# Patient Record
Sex: Female | Born: 1970
Health system: Southern US, Community
[De-identification: ages and names within clinical notes are randomized; demographics above are authoritative.]

## PROBLEM LIST (undated history)

## (undated) DIAGNOSIS — N186 End stage renal disease: Secondary | ICD-10-CM

## (undated) DIAGNOSIS — E119 Type 2 diabetes mellitus without complications: Secondary | ICD-10-CM

## (undated) DIAGNOSIS — M329 Systemic lupus erythematosus, unspecified: Secondary | ICD-10-CM

## (undated) DIAGNOSIS — K922 Gastrointestinal hemorrhage, unspecified: Secondary | ICD-10-CM

## (undated) DIAGNOSIS — Z992 Dependence on renal dialysis: Secondary | ICD-10-CM

## (undated) DIAGNOSIS — I2510 Atherosclerotic heart disease of native coronary artery without angina pectoris: Secondary | ICD-10-CM

## (undated) DIAGNOSIS — N289 Disorder of kidney and ureter, unspecified: Secondary | ICD-10-CM

## (undated) DIAGNOSIS — D649 Anemia, unspecified: Secondary | ICD-10-CM

## (undated) DIAGNOSIS — I10 Essential (primary) hypertension: Secondary | ICD-10-CM

## (undated) DIAGNOSIS — K5792 Diverticulitis of intestine, part unspecified, without perforation or abscess without bleeding: Secondary | ICD-10-CM

## (undated) DIAGNOSIS — IMO0002 Reserved for concepts with insufficient information to code with codable children: Secondary | ICD-10-CM

## (undated) HISTORY — DX: End stage renal disease: N18.6

## (undated) HISTORY — PX: CARDIAC SURGERY: SHX584

## (undated) HISTORY — DX: Atherosclerotic heart disease of native coronary artery without angina pectoris: I25.10

## (undated) HISTORY — DX: Gastrointestinal hemorrhage, unspecified: K92.2

## (undated) HISTORY — PX: OTHER SURGICAL HISTORY: SHX169

## (undated) HISTORY — DX: Anemia, unspecified: D64.9

## (undated) NOTE — *Deleted (*Deleted)
[facility] Note  01/07/2020     CHIEF COMPLAINT Patient presents for No chief complaint on file.   HISTORY OF PRESENT ILLNESS: Paula Bass is a 62 y.o. female who presents to the clinic today for:   pt states she feels like the vision in her right eye has improved, but she can still see the floaters  Referring physician: Benito Mccreedy, [place] [id number] [place],  [place] [address]  HISTORICAL INFORMATION:   Selected notes from the [facility] Referred by Shirleen Schirmer, PA-C for DM exam LEE:  Ocular Hx- PMH-    CURRENT MEDICATIONS: Current Outpatient Medications (Ophthalmic Drugs)  Medication Sig  . Polyethyl Glycol-Propyl Glycol (LUBRICANT EYE DROPS) 0.4-0.3 % SOLN Place 1 drop into both eyes 3 (three) times daily as needed (dry/irritated eyes.).   No current facility-administered medications for this visit. (Ophthalmic Drugs)   Current Outpatient Medications (Other)  Medication Sig  . acetaminophen (TYLENOL) 325 MG tablet Take 2 tablets (650 mg total) by mouth every 4 (four) hours as needed for fever, headache or mild pain. (Patient not taking: Reported on 10/20/2019)  . amLODipine (NORVASC) 10 MG tablet Take 10 mg by mouth daily.  Marland Kitchen ammonium lactate (AMLACTIN) 12 % lotion Apply 1 application topically as needed for dry skin. (Patient not taking: Reported on 09/05/2019)  . aspirin EC 81 MG tablet Take 1 tablet (81 mg total) by mouth daily.  Marland Kitchen atorvastatin (LIPITOR) 40 MG tablet Take 40 mg by mouth daily.   Lorin Picket 1 GM 210 MG(Fe) tablet Take 210-420 tablets by mouth See admin instructions. Take 420 mg by mouth three times a day with meals and 210 mg twice a day with a snack  . clopidogrel (PLAVIX) 75 MG tablet Take 1 tablet (75 mg total) by mouth daily.  . insulin NPH-regular Human (70-30) 100 UNIT/ML injection Inject 30-40 Units into the skin See admin instructions. Inject 40 units in the morning and 30  units at bedtime.   . isosorbide mononitrate (IMDUR) 30 MG 24 hr tablet Take 1 tablet (30 mg total) by mouth daily. Please make yearly appt with Dr. Acie Fredrickson for December for future refills. Thank you 1st attempt  . Lactobacillus (ACIDOPHILUS) CAPS capsule Take 1 capsule by mouth daily.  Marland Kitchen lidocaine-prilocaine (EMLA) cream Apply 1 application topically Every Tuesday,Thursday,and Saturday with dialysis.   Marland Kitchen metoprolol tartrate (LOPRESSOR) 25 MG tablet Take 1 tablet (25 mg total) by mouth 2 (two) times daily.  . multivitamin (RENA-VIT) TABS tablet Take 1 tablet by mouth daily.  . nitroGLYCERIN (NITROSTAT) 0.4 MG SL tablet Place 1 tablet (0.4 mg total) under the tongue every 5 (five) minutes as needed.  Marland Kitchen oxyCODONE (OXY IR/ROXICODONE) 5 MG immediate release tablet Take 1 tablet (5 mg total) by mouth every 4 (four) hours as needed for moderate pain or severe pain (5mg  for moderate pain, 10mg  for severe pain). (Patient not taking: Reported on 09/05/2019)  . pantoprazole (PROTONIX) 40 MG tablet Take 1 tablet (40 mg total) by mouth daily. Needs office visit.  . predniSONE (DELTASONE) 5 MG tablet Take 5 mg by mouth daily with breakfast.    No current facility-administered medications for this visit. (Other)      REVIEW OF SYSTEMS:    ALLERGIES Allergies  Allergen Reactions  . Penicillins Hives    DID THE REACTION INVOLVE: Swelling of the face/tongue/throat, SOB, or low BP? Yes Sudden or severe rash/hives, skin peeling, or the inside  of the mouth or nose? Yes Did it require medical treatment? No When did it last happen?Within the past 10 years If all above answers are "NO", may proceed with cephalosporin use. Ceftriaxone/merrem ok    PAST MEDICAL HISTORY Past Medical History:  Diagnosis Date  . Acute diverticulitis 12/01/2017  . CAD (coronary artery disease) 2016  . CAD (coronary artery disease) 04/19/2018   Hx of PCI to [facility] in [place] in 2017 // [place] 03/2018: dLCx 47, OM1 40, OM2 50, pRCA stent  ok, mid RCA 90, EF 55-65, PCI: DES to dLCx  . Diabetes mellitus without complication ([place])   . Dialysis patient ([employer] [facility])   . Diverticulitis   . GI bleeding   . Hypertension   . LGI bleed 12/16/2017  . Lower GI bleed   . Lupus ([place])   . Renal disorder    dialysis  . UTI (urinary tract infection) 12/01/2017   Past Surgical History:  Procedure Laterality Date  . BIOPSY  10/24/2019   Procedure: BIOPSY;  Surgeon: Otis Brace, MD;  Location: [facility];  Service: Gastroenterology;;  EGD and COLON  . CARDIAC SURGERY     cardiac shunt  . COLONOSCOPY WITH PROPOFOL N/A 10/24/2019   Procedure: COLONOSCOPY WITH PROPOFOL;  Surgeon: Otis Brace, MD;  Location: [facility];  Service: Gastroenterology;  Laterality: N/A;  . CORONARY STENT INTERVENTION N/A 04/19/2018   Procedure: CORONARY STENT INTERVENTION;  Surgeon: [name], MD;  Location: [place] [facility];  Service: Cardiovascular;  Laterality: N/A;  . ESOPHAGOGASTRODUODENOSCOPY (EGD) WITH PROPOFOL N/A 10/24/2019   Procedure: ESOPHAGOGASTRODUODENOSCOPY (EGD) WITH PROPOFOL;  Surgeon: Otis Brace, MD;  Location: [facility];  Service: Gastroenterology;  Laterality: N/A;  . INCISION AND DRAINAGE PERIRECTAL ABSCESS N/A 07/09/2019   Procedure: IRRIGATION AND DEBRIDEMEN right buttock ABSCESS;  Surgeon: Kieth Brightly, Arta Bruce, MD;  Location: [facility];  Service: General;  Laterality: N/A;  . Kidney graft Left   . LEFT HEART CATH AND CORONARY ANGIOGRAPHY N/A 04/19/2018   Procedure: LEFT HEART CATH AND CORONARY ANGIOGRAPHY;  Surgeon: [name], MD;  Location: [facility];  Service: Cardiovascular;  Laterality: N/A;  . POLYPECTOMY  10/24/2019   Procedure: POLYPECTOMY;  Surgeon: Otis Brace, MD;  Location: [facility];  Service: Gastroenterology;;    FAMILY HISTORY Family History  Problem Relation Age of Onset  . Hypertension Mother   . Other Father        unkown health history  . Healthy Brother   . Healthy  Brother   . Colon cancer Neg Hx   . Esophageal cancer Neg Hx   . Breast cancer Neg Hx     SOCIAL HISTORY Social History   Tobacco Use  . Smoking status: Never Smoker  . Smokeless tobacco: Never Used  Vaping Use  . Vaping Use: Never used  Substance Use Topics  . Alcohol use: Never  . Drug use: Never         OPHTHALMIC EXAM:  Not recorded     IMAGING AND PROCEDURES  Imaging and Procedures for 01/07/2020           ASSESSMENT/PLAN:    ICD-10-CM   1. Vitreous hemorrhage of right eye ([place])  H43.11   2. 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 Moderate nonproliferative diabetic retinopathy of both eyes without macular edema associated with type 2 diabetes mellitus ([place])  [id number]   4. Retinal edema  H35.81   5. Essential hypertension  I10   6. Hypertensive retinopathy  of both eyes  H35.033   7. Combined forms of age-related cataract of both eyes  H25.813     1. Vitreous Hemorrhage OD  - diffuse VH, onset Tuesday, 10.19.21  - pt reports BP was elevated at time of onset due to inadvertent stoppage of amlodipine  - pt also reports use of blood thinner for dialysis and history of low vision OD related to lupus vasculitis and ?nerve damage  - s/p IVA OD #1 10.22.21  - today, vast improvement in diffuse VH--clearing and settling inferiorly  - BCVA remains CF OD  - OCT shows central inner retinal atrophy -- RAO/retinal vasculitits  - VH precautions reviewed -- minimize activities, keep head elevated, avoid ASA/NSAIDs/blood thinners as able  - f/u 2-3 weeks, DFE, OCT, FA  2. History of lupus vasculitis  - pt reports poor vision OD at baseline prior to onset of VH from optic nerve damage and lupus vasculitis  - pt moved to [place] ~1 yr ago from [place] where she was treated and followed by a retina specialist  - monitor-will do FA once VH clears to check for NV  3,4. Mild nonproliferative diabetic retinopathy w/o DME, both eyes - exam shows mild MA OS; OD fundus  exam obscured by diffuse VH - OCT without diabetic macular edema  - monitor  5,6. Hypertensive retinopathy OU  - pt reports SBP elevated to 160s when VH started due to missing amlodipine doses - discussed importance of tight BP control - monitor  7. Mixed Cataract OU - The symptoms of cataract, surgical options, and treatments and risks were discussed with patient. - discussed diagnosis and progression - monitor   Ophthalmic Meds Ordered this visit:  No orders of the defined types were placed in this encounter.      No follow-ups on file.  There are no Patient Instructions on file for this visit.   Explained the diagnoses, plan, and follow up with the patient and they expressed understanding.  Patient expressed understanding of the importance of proper follow up care.   This document serves as a record of services personally performed by Gardiner Sleeper, MD, PhD. It was created on their behalf by Roselee Nova, COMT. The creation of this record is the provider's dictation and/or activities during the visit.  Electronically signed by: Roselee Nova, COMT 01/06/20 10:09 AM   Gardiner Sleeper, M.D., Ph.D. Diseases & Surgery of the Retina and Vitreous [facility]   Abbreviations: M myopia (nearsighted); A astigmatism; H hyperopia (farsighted); P presbyopia; Mrx spectacle prescription;  CTL contact lenses; OD right eye; OS left eye; OU both eyes  XT exotropia; ET esotropia; PEK punctate epithelial keratitis; PEE punctate epithelial erosions; DES dry eye syndrome; MGD meibomian gland dysfunction; ATs artificial tears; PFAT's preservative free artificial tears; [place] nuclear sclerotic cataract; PSC posterior subcapsular cataract; ERM epi-retinal membrane; PVD posterior vitreous detachment; RD retinal detachment; DM diabetes mellitus; DR diabetic retinopathy; NPDR non-proliferative diabetic retinopathy; PDR proliferative diabetic retinopathy; CSME clinically  significant macular edema; DME diabetic macular edema; dbh dot blot hemorrhages; CWS cotton wool spot; POAG primary open angle glaucoma; C/D cup-to-disc ratio; HVF humphrey visual field; GVF goldmann visual field; OCT optical coherence tomography; IOP intraocular pressure; BRVO Branch retinal vein occlusion; CRVO central retinal vein occlusion; CRAO central retinal artery occlusion; BRAO branch retinal artery occlusion; RT retinal tear; SB scleral buckle; PPV pars plana vitrectomy; VH Vitreous hemorrhage; PRP panretinal laser photocoagulation; IVK intravitreal kenalog; VMT vitreomacular traction; MH Macular  hole;  NVD neovascularization of the disc; NVE neovascularization elsewhere; AREDS age related eye disease study; ARMD age related macular degeneration; POAG primary open angle glaucoma; EBMD epithelial/anterior basement membrane dystrophy; ACIOL anterior chamber intraocular lens; IOL intraocular lens; PCIOL posterior chamber intraocular lens; Phaco/IOL phacoemulsification with intraocular lens placement; [place] photorefractive keratectomy; LASIK laser assisted in situ keratomileusis; HTN hypertension; DM diabetes mellitus; COPD chronic obstructive pulmonary disease

---

## 2016-02-28 DIAGNOSIS — N186 End stage renal disease: Secondary | ICD-10-CM | POA: Diagnosis not present

## 2016-02-28 DIAGNOSIS — Z992 Dependence on renal dialysis: Secondary | ICD-10-CM | POA: Diagnosis not present

## 2016-02-28 DIAGNOSIS — M3210 Systemic lupus erythematosus, organ or system involvement unspecified: Secondary | ICD-10-CM | POA: Diagnosis not present

## 2016-02-28 DIAGNOSIS — E1122 Type 2 diabetes mellitus with diabetic chronic kidney disease: Secondary | ICD-10-CM | POA: Diagnosis not present

## 2016-03-30 DIAGNOSIS — E1122 Type 2 diabetes mellitus with diabetic chronic kidney disease: Secondary | ICD-10-CM | POA: Diagnosis not present

## 2016-03-30 DIAGNOSIS — N186 End stage renal disease: Secondary | ICD-10-CM | POA: Diagnosis not present

## 2016-03-30 DIAGNOSIS — Z992 Dependence on renal dialysis: Secondary | ICD-10-CM | POA: Diagnosis not present

## 2016-03-30 DIAGNOSIS — M3210 Systemic lupus erythematosus, organ or system involvement unspecified: Secondary | ICD-10-CM | POA: Diagnosis not present

## 2016-08-27 DIAGNOSIS — N186 End stage renal disease: Secondary | ICD-10-CM | POA: Diagnosis not present

## 2016-08-27 DIAGNOSIS — E1122 Type 2 diabetes mellitus with diabetic chronic kidney disease: Secondary | ICD-10-CM | POA: Diagnosis not present

## 2016-08-27 DIAGNOSIS — M3210 Systemic lupus erythematosus, organ or system involvement unspecified: Secondary | ICD-10-CM | POA: Diagnosis not present

## 2016-08-27 DIAGNOSIS — Z992 Dependence on renal dialysis: Secondary | ICD-10-CM | POA: Diagnosis not present

## 2016-08-28 DIAGNOSIS — Z992 Dependence on renal dialysis: Secondary | ICD-10-CM | POA: Diagnosis not present

## 2016-08-28 DIAGNOSIS — D509 Iron deficiency anemia, unspecified: Secondary | ICD-10-CM | POA: Diagnosis not present

## 2016-08-28 DIAGNOSIS — N2581 Secondary hyperparathyroidism of renal origin: Secondary | ICD-10-CM | POA: Diagnosis not present

## 2016-08-28 DIAGNOSIS — N186 End stage renal disease: Secondary | ICD-10-CM | POA: Diagnosis not present

## 2016-08-28 DIAGNOSIS — D631 Anemia in chronic kidney disease: Secondary | ICD-10-CM | POA: Diagnosis not present

## 2016-08-30 DIAGNOSIS — D631 Anemia in chronic kidney disease: Secondary | ICD-10-CM | POA: Diagnosis not present

## 2016-08-30 DIAGNOSIS — N186 End stage renal disease: Secondary | ICD-10-CM | POA: Diagnosis not present

## 2016-08-30 DIAGNOSIS — D509 Iron deficiency anemia, unspecified: Secondary | ICD-10-CM | POA: Diagnosis not present

## 2016-08-30 DIAGNOSIS — Z992 Dependence on renal dialysis: Secondary | ICD-10-CM | POA: Diagnosis not present

## 2016-08-30 DIAGNOSIS — N2581 Secondary hyperparathyroidism of renal origin: Secondary | ICD-10-CM | POA: Diagnosis not present

## 2016-09-01 DIAGNOSIS — D631 Anemia in chronic kidney disease: Secondary | ICD-10-CM | POA: Diagnosis not present

## 2016-09-01 DIAGNOSIS — D509 Iron deficiency anemia, unspecified: Secondary | ICD-10-CM | POA: Diagnosis not present

## 2016-09-01 DIAGNOSIS — N2581 Secondary hyperparathyroidism of renal origin: Secondary | ICD-10-CM | POA: Diagnosis not present

## 2016-09-01 DIAGNOSIS — Z992 Dependence on renal dialysis: Secondary | ICD-10-CM | POA: Diagnosis not present

## 2016-09-01 DIAGNOSIS — N186 End stage renal disease: Secondary | ICD-10-CM | POA: Diagnosis not present

## 2016-09-05 DIAGNOSIS — N2581 Secondary hyperparathyroidism of renal origin: Secondary | ICD-10-CM | POA: Diagnosis not present

## 2016-09-05 DIAGNOSIS — N186 End stage renal disease: Secondary | ICD-10-CM | POA: Diagnosis not present

## 2016-09-05 DIAGNOSIS — D631 Anemia in chronic kidney disease: Secondary | ICD-10-CM | POA: Diagnosis not present

## 2016-09-05 DIAGNOSIS — D509 Iron deficiency anemia, unspecified: Secondary | ICD-10-CM | POA: Diagnosis not present

## 2016-09-05 DIAGNOSIS — Z992 Dependence on renal dialysis: Secondary | ICD-10-CM | POA: Diagnosis not present

## 2016-09-08 DIAGNOSIS — N2581 Secondary hyperparathyroidism of renal origin: Secondary | ICD-10-CM | POA: Diagnosis not present

## 2016-09-08 DIAGNOSIS — N186 End stage renal disease: Secondary | ICD-10-CM | POA: Diagnosis not present

## 2016-09-08 DIAGNOSIS — Z992 Dependence on renal dialysis: Secondary | ICD-10-CM | POA: Diagnosis not present

## 2016-09-08 DIAGNOSIS — D631 Anemia in chronic kidney disease: Secondary | ICD-10-CM | POA: Diagnosis not present

## 2016-09-08 DIAGNOSIS — D509 Iron deficiency anemia, unspecified: Secondary | ICD-10-CM | POA: Diagnosis not present

## 2016-09-11 DIAGNOSIS — Z992 Dependence on renal dialysis: Secondary | ICD-10-CM | POA: Diagnosis not present

## 2016-09-11 DIAGNOSIS — Z794 Long term (current) use of insulin: Secondary | ICD-10-CM | POA: Diagnosis not present

## 2016-09-11 DIAGNOSIS — D631 Anemia in chronic kidney disease: Secondary | ICD-10-CM | POA: Diagnosis not present

## 2016-09-11 DIAGNOSIS — E119 Type 2 diabetes mellitus without complications: Secondary | ICD-10-CM | POA: Diagnosis not present

## 2016-09-11 DIAGNOSIS — N2581 Secondary hyperparathyroidism of renal origin: Secondary | ICD-10-CM | POA: Diagnosis not present

## 2016-09-11 DIAGNOSIS — D509 Iron deficiency anemia, unspecified: Secondary | ICD-10-CM | POA: Diagnosis not present

## 2016-09-11 DIAGNOSIS — N186 End stage renal disease: Secondary | ICD-10-CM | POA: Diagnosis not present

## 2016-09-12 DIAGNOSIS — K2990 Gastroduodenitis, unspecified, without bleeding: Secondary | ICD-10-CM | POA: Diagnosis not present

## 2016-09-13 DIAGNOSIS — Z992 Dependence on renal dialysis: Secondary | ICD-10-CM | POA: Diagnosis not present

## 2016-09-13 DIAGNOSIS — N186 End stage renal disease: Secondary | ICD-10-CM | POA: Diagnosis not present

## 2016-09-13 DIAGNOSIS — Z01419 Encounter for gynecological examination (general) (routine) without abnormal findings: Secondary | ICD-10-CM | POA: Diagnosis not present

## 2016-09-13 DIAGNOSIS — D509 Iron deficiency anemia, unspecified: Secondary | ICD-10-CM | POA: Diagnosis not present

## 2016-09-13 DIAGNOSIS — D631 Anemia in chronic kidney disease: Secondary | ICD-10-CM | POA: Diagnosis not present

## 2016-09-13 DIAGNOSIS — N2581 Secondary hyperparathyroidism of renal origin: Secondary | ICD-10-CM | POA: Diagnosis not present

## 2016-09-15 DIAGNOSIS — N186 End stage renal disease: Secondary | ICD-10-CM | POA: Diagnosis not present

## 2016-09-15 DIAGNOSIS — N2581 Secondary hyperparathyroidism of renal origin: Secondary | ICD-10-CM | POA: Diagnosis not present

## 2016-09-15 DIAGNOSIS — D509 Iron deficiency anemia, unspecified: Secondary | ICD-10-CM | POA: Diagnosis not present

## 2016-09-15 DIAGNOSIS — D631 Anemia in chronic kidney disease: Secondary | ICD-10-CM | POA: Diagnosis not present

## 2016-09-15 DIAGNOSIS — Z992 Dependence on renal dialysis: Secondary | ICD-10-CM | POA: Diagnosis not present

## 2016-09-18 DIAGNOSIS — D509 Iron deficiency anemia, unspecified: Secondary | ICD-10-CM | POA: Diagnosis not present

## 2016-09-18 DIAGNOSIS — N186 End stage renal disease: Secondary | ICD-10-CM | POA: Diagnosis not present

## 2016-09-18 DIAGNOSIS — D631 Anemia in chronic kidney disease: Secondary | ICD-10-CM | POA: Diagnosis not present

## 2016-09-18 DIAGNOSIS — Z992 Dependence on renal dialysis: Secondary | ICD-10-CM | POA: Diagnosis not present

## 2016-09-18 DIAGNOSIS — N2581 Secondary hyperparathyroidism of renal origin: Secondary | ICD-10-CM | POA: Diagnosis not present

## 2016-09-20 DIAGNOSIS — D509 Iron deficiency anemia, unspecified: Secondary | ICD-10-CM | POA: Diagnosis not present

## 2016-09-20 DIAGNOSIS — D631 Anemia in chronic kidney disease: Secondary | ICD-10-CM | POA: Diagnosis not present

## 2016-09-20 DIAGNOSIS — N186 End stage renal disease: Secondary | ICD-10-CM | POA: Diagnosis not present

## 2016-09-20 DIAGNOSIS — N2581 Secondary hyperparathyroidism of renal origin: Secondary | ICD-10-CM | POA: Diagnosis not present

## 2016-09-20 DIAGNOSIS — Z992 Dependence on renal dialysis: Secondary | ICD-10-CM | POA: Diagnosis not present

## 2016-09-22 DIAGNOSIS — N2581 Secondary hyperparathyroidism of renal origin: Secondary | ICD-10-CM | POA: Diagnosis not present

## 2016-09-22 DIAGNOSIS — D509 Iron deficiency anemia, unspecified: Secondary | ICD-10-CM | POA: Diagnosis not present

## 2016-09-22 DIAGNOSIS — D631 Anemia in chronic kidney disease: Secondary | ICD-10-CM | POA: Diagnosis not present

## 2016-09-22 DIAGNOSIS — Z992 Dependence on renal dialysis: Secondary | ICD-10-CM | POA: Diagnosis not present

## 2016-09-22 DIAGNOSIS — N186 End stage renal disease: Secondary | ICD-10-CM | POA: Diagnosis not present

## 2016-09-25 DIAGNOSIS — D631 Anemia in chronic kidney disease: Secondary | ICD-10-CM | POA: Diagnosis not present

## 2016-09-25 DIAGNOSIS — Z992 Dependence on renal dialysis: Secondary | ICD-10-CM | POA: Diagnosis not present

## 2016-09-25 DIAGNOSIS — N186 End stage renal disease: Secondary | ICD-10-CM | POA: Diagnosis not present

## 2016-09-25 DIAGNOSIS — D509 Iron deficiency anemia, unspecified: Secondary | ICD-10-CM | POA: Diagnosis not present

## 2016-09-25 DIAGNOSIS — N2581 Secondary hyperparathyroidism of renal origin: Secondary | ICD-10-CM | POA: Diagnosis not present

## 2016-09-27 DIAGNOSIS — E1122 Type 2 diabetes mellitus with diabetic chronic kidney disease: Secondary | ICD-10-CM | POA: Diagnosis not present

## 2016-09-27 DIAGNOSIS — Z992 Dependence on renal dialysis: Secondary | ICD-10-CM | POA: Diagnosis not present

## 2016-09-27 DIAGNOSIS — N186 End stage renal disease: Secondary | ICD-10-CM | POA: Diagnosis not present

## 2016-09-27 DIAGNOSIS — M3210 Systemic lupus erythematosus, organ or system involvement unspecified: Secondary | ICD-10-CM | POA: Diagnosis not present

## 2016-09-29 DIAGNOSIS — Z992 Dependence on renal dialysis: Secondary | ICD-10-CM | POA: Diagnosis not present

## 2016-09-29 DIAGNOSIS — D631 Anemia in chronic kidney disease: Secondary | ICD-10-CM | POA: Diagnosis not present

## 2016-09-29 DIAGNOSIS — N186 End stage renal disease: Secondary | ICD-10-CM | POA: Diagnosis not present

## 2016-09-29 DIAGNOSIS — D509 Iron deficiency anemia, unspecified: Secondary | ICD-10-CM | POA: Diagnosis not present

## 2016-09-29 DIAGNOSIS — N2581 Secondary hyperparathyroidism of renal origin: Secondary | ICD-10-CM | POA: Diagnosis not present

## 2016-10-02 DIAGNOSIS — N2581 Secondary hyperparathyroidism of renal origin: Secondary | ICD-10-CM | POA: Diagnosis not present

## 2016-10-02 DIAGNOSIS — N186 End stage renal disease: Secondary | ICD-10-CM | POA: Diagnosis not present

## 2016-10-02 DIAGNOSIS — D509 Iron deficiency anemia, unspecified: Secondary | ICD-10-CM | POA: Diagnosis not present

## 2016-10-02 DIAGNOSIS — D631 Anemia in chronic kidney disease: Secondary | ICD-10-CM | POA: Diagnosis not present

## 2016-10-02 DIAGNOSIS — Z992 Dependence on renal dialysis: Secondary | ICD-10-CM | POA: Diagnosis not present

## 2016-10-04 DIAGNOSIS — Z992 Dependence on renal dialysis: Secondary | ICD-10-CM | POA: Diagnosis not present

## 2016-10-04 DIAGNOSIS — D631 Anemia in chronic kidney disease: Secondary | ICD-10-CM | POA: Diagnosis not present

## 2016-10-04 DIAGNOSIS — D509 Iron deficiency anemia, unspecified: Secondary | ICD-10-CM | POA: Diagnosis not present

## 2016-10-04 DIAGNOSIS — N186 End stage renal disease: Secondary | ICD-10-CM | POA: Diagnosis not present

## 2016-10-04 DIAGNOSIS — N2581 Secondary hyperparathyroidism of renal origin: Secondary | ICD-10-CM | POA: Diagnosis not present

## 2016-10-06 DIAGNOSIS — M3214 Glomerular disease in systemic lupus erythematosus: Secondary | ICD-10-CM | POA: Diagnosis not present

## 2016-10-06 DIAGNOSIS — Z992 Dependence on renal dialysis: Secondary | ICD-10-CM | POA: Diagnosis not present

## 2016-10-06 DIAGNOSIS — N2581 Secondary hyperparathyroidism of renal origin: Secondary | ICD-10-CM | POA: Diagnosis not present

## 2016-10-06 DIAGNOSIS — N186 End stage renal disease: Secondary | ICD-10-CM | POA: Diagnosis not present

## 2016-10-09 DIAGNOSIS — N2581 Secondary hyperparathyroidism of renal origin: Secondary | ICD-10-CM | POA: Diagnosis not present

## 2016-10-09 DIAGNOSIS — D631 Anemia in chronic kidney disease: Secondary | ICD-10-CM | POA: Diagnosis not present

## 2016-10-09 DIAGNOSIS — Z992 Dependence on renal dialysis: Secondary | ICD-10-CM | POA: Diagnosis not present

## 2016-10-09 DIAGNOSIS — N186 End stage renal disease: Secondary | ICD-10-CM | POA: Diagnosis not present

## 2016-10-09 DIAGNOSIS — D509 Iron deficiency anemia, unspecified: Secondary | ICD-10-CM | POA: Diagnosis not present

## 2016-10-11 DIAGNOSIS — D631 Anemia in chronic kidney disease: Secondary | ICD-10-CM | POA: Diagnosis not present

## 2016-10-11 DIAGNOSIS — N186 End stage renal disease: Secondary | ICD-10-CM | POA: Diagnosis not present

## 2016-10-11 DIAGNOSIS — D509 Iron deficiency anemia, unspecified: Secondary | ICD-10-CM | POA: Diagnosis not present

## 2016-10-11 DIAGNOSIS — N2581 Secondary hyperparathyroidism of renal origin: Secondary | ICD-10-CM | POA: Diagnosis not present

## 2016-10-11 DIAGNOSIS — Z992 Dependence on renal dialysis: Secondary | ICD-10-CM | POA: Diagnosis not present

## 2016-10-13 DIAGNOSIS — N2581 Secondary hyperparathyroidism of renal origin: Secondary | ICD-10-CM | POA: Diagnosis not present

## 2016-10-13 DIAGNOSIS — D631 Anemia in chronic kidney disease: Secondary | ICD-10-CM | POA: Diagnosis not present

## 2016-10-13 DIAGNOSIS — Z992 Dependence on renal dialysis: Secondary | ICD-10-CM | POA: Diagnosis not present

## 2016-10-13 DIAGNOSIS — D509 Iron deficiency anemia, unspecified: Secondary | ICD-10-CM | POA: Diagnosis not present

## 2016-10-13 DIAGNOSIS — N186 End stage renal disease: Secondary | ICD-10-CM | POA: Diagnosis not present

## 2016-10-16 DIAGNOSIS — Z992 Dependence on renal dialysis: Secondary | ICD-10-CM | POA: Diagnosis not present

## 2016-10-16 DIAGNOSIS — D631 Anemia in chronic kidney disease: Secondary | ICD-10-CM | POA: Diagnosis not present

## 2016-10-16 DIAGNOSIS — N186 End stage renal disease: Secondary | ICD-10-CM | POA: Diagnosis not present

## 2016-10-16 DIAGNOSIS — D509 Iron deficiency anemia, unspecified: Secondary | ICD-10-CM | POA: Diagnosis not present

## 2016-10-16 DIAGNOSIS — N2581 Secondary hyperparathyroidism of renal origin: Secondary | ICD-10-CM | POA: Diagnosis not present

## 2016-10-17 DIAGNOSIS — E038 Other specified hypothyroidism: Secondary | ICD-10-CM | POA: Diagnosis not present

## 2016-10-17 DIAGNOSIS — E785 Hyperlipidemia, unspecified: Secondary | ICD-10-CM | POA: Diagnosis not present

## 2016-10-17 DIAGNOSIS — E559 Vitamin D deficiency, unspecified: Secondary | ICD-10-CM | POA: Diagnosis not present

## 2016-10-17 DIAGNOSIS — E0865 Diabetes mellitus due to underlying condition with hyperglycemia: Secondary | ICD-10-CM | POA: Diagnosis not present

## 2016-10-17 DIAGNOSIS — R109 Unspecified abdominal pain: Secondary | ICD-10-CM | POA: Diagnosis not present

## 2016-10-17 DIAGNOSIS — K900 Celiac disease: Secondary | ICD-10-CM | POA: Diagnosis not present

## 2016-10-17 DIAGNOSIS — R799 Abnormal finding of blood chemistry, unspecified: Secondary | ICD-10-CM | POA: Diagnosis not present

## 2016-10-17 DIAGNOSIS — R6889 Other general symptoms and signs: Secondary | ICD-10-CM | POA: Diagnosis not present

## 2016-10-18 DIAGNOSIS — D509 Iron deficiency anemia, unspecified: Secondary | ICD-10-CM | POA: Diagnosis not present

## 2016-10-18 DIAGNOSIS — N186 End stage renal disease: Secondary | ICD-10-CM | POA: Diagnosis not present

## 2016-10-18 DIAGNOSIS — Z992 Dependence on renal dialysis: Secondary | ICD-10-CM | POA: Diagnosis not present

## 2016-10-18 DIAGNOSIS — N2581 Secondary hyperparathyroidism of renal origin: Secondary | ICD-10-CM | POA: Diagnosis not present

## 2016-10-18 DIAGNOSIS — D631 Anemia in chronic kidney disease: Secondary | ICD-10-CM | POA: Diagnosis not present

## 2016-10-20 DIAGNOSIS — D509 Iron deficiency anemia, unspecified: Secondary | ICD-10-CM | POA: Diagnosis not present

## 2016-10-20 DIAGNOSIS — Z992 Dependence on renal dialysis: Secondary | ICD-10-CM | POA: Diagnosis not present

## 2016-10-20 DIAGNOSIS — D631 Anemia in chronic kidney disease: Secondary | ICD-10-CM | POA: Diagnosis not present

## 2016-10-20 DIAGNOSIS — N186 End stage renal disease: Secondary | ICD-10-CM | POA: Diagnosis not present

## 2016-10-20 DIAGNOSIS — N2581 Secondary hyperparathyroidism of renal origin: Secondary | ICD-10-CM | POA: Diagnosis not present

## 2016-10-23 DIAGNOSIS — Z992 Dependence on renal dialysis: Secondary | ICD-10-CM | POA: Diagnosis not present

## 2016-10-23 DIAGNOSIS — N2581 Secondary hyperparathyroidism of renal origin: Secondary | ICD-10-CM | POA: Diagnosis not present

## 2016-10-23 DIAGNOSIS — D631 Anemia in chronic kidney disease: Secondary | ICD-10-CM | POA: Diagnosis not present

## 2016-10-23 DIAGNOSIS — N186 End stage renal disease: Secondary | ICD-10-CM | POA: Diagnosis not present

## 2016-10-23 DIAGNOSIS — D509 Iron deficiency anemia, unspecified: Secondary | ICD-10-CM | POA: Diagnosis not present

## 2016-10-25 DIAGNOSIS — Z992 Dependence on renal dialysis: Secondary | ICD-10-CM | POA: Diagnosis not present

## 2016-10-25 DIAGNOSIS — D631 Anemia in chronic kidney disease: Secondary | ICD-10-CM | POA: Diagnosis not present

## 2016-10-25 DIAGNOSIS — N186 End stage renal disease: Secondary | ICD-10-CM | POA: Diagnosis not present

## 2016-10-25 DIAGNOSIS — N2581 Secondary hyperparathyroidism of renal origin: Secondary | ICD-10-CM | POA: Diagnosis not present

## 2016-10-25 DIAGNOSIS — D509 Iron deficiency anemia, unspecified: Secondary | ICD-10-CM | POA: Diagnosis not present

## 2016-10-27 DIAGNOSIS — D509 Iron deficiency anemia, unspecified: Secondary | ICD-10-CM | POA: Diagnosis not present

## 2016-10-27 DIAGNOSIS — D631 Anemia in chronic kidney disease: Secondary | ICD-10-CM | POA: Diagnosis not present

## 2016-10-27 DIAGNOSIS — Z992 Dependence on renal dialysis: Secondary | ICD-10-CM | POA: Diagnosis not present

## 2016-10-27 DIAGNOSIS — N186 End stage renal disease: Secondary | ICD-10-CM | POA: Diagnosis not present

## 2016-10-27 DIAGNOSIS — N2581 Secondary hyperparathyroidism of renal origin: Secondary | ICD-10-CM | POA: Diagnosis not present

## 2017-01-04 DIAGNOSIS — Z21 Asymptomatic human immunodeficiency virus [HIV] infection status: Secondary | ICD-10-CM | POA: Diagnosis not present

## 2017-01-04 DIAGNOSIS — E559 Vitamin D deficiency, unspecified: Secondary | ICD-10-CM | POA: Diagnosis not present

## 2017-01-04 DIAGNOSIS — R079 Chest pain, unspecified: Secondary | ICD-10-CM | POA: Diagnosis not present

## 2017-01-04 DIAGNOSIS — E038 Other specified hypothyroidism: Secondary | ICD-10-CM | POA: Diagnosis not present

## 2017-01-04 DIAGNOSIS — R0602 Shortness of breath: Secondary | ICD-10-CM | POA: Diagnosis not present

## 2017-01-04 DIAGNOSIS — R6889 Other general symptoms and signs: Secondary | ICD-10-CM | POA: Diagnosis not present

## 2017-01-04 DIAGNOSIS — B159 Hepatitis A without hepatic coma: Secondary | ICD-10-CM | POA: Diagnosis not present

## 2017-01-04 DIAGNOSIS — R799 Abnormal finding of blood chemistry, unspecified: Secondary | ICD-10-CM | POA: Diagnosis not present

## 2017-01-04 DIAGNOSIS — I249 Acute ischemic heart disease, unspecified: Secondary | ICD-10-CM | POA: Diagnosis not present

## 2017-01-04 DIAGNOSIS — R011 Cardiac murmur, unspecified: Secondary | ICD-10-CM | POA: Diagnosis not present

## 2017-01-04 DIAGNOSIS — E0865 Diabetes mellitus due to underlying condition with hyperglycemia: Secondary | ICD-10-CM | POA: Diagnosis not present

## 2017-01-04 DIAGNOSIS — E785 Hyperlipidemia, unspecified: Secondary | ICD-10-CM | POA: Diagnosis not present

## 2017-01-05 DIAGNOSIS — R0602 Shortness of breath: Secondary | ICD-10-CM | POA: Diagnosis not present

## 2017-12-01 ENCOUNTER — Emergency Department (HOSPITAL_COMMUNITY): Payer: Medicare (Managed Care)

## 2017-12-01 ENCOUNTER — Inpatient Hospital Stay (HOSPITAL_COMMUNITY)
Admission: EM | Admit: 2017-12-01 | Discharge: 2017-12-04 | DRG: 391 | Disposition: A | Discharge status: Home / Self Care | Payer: Medicare (Managed Care) | Attending: Family Medicine | Admitting: Family Medicine

## 2017-12-01 ENCOUNTER — Encounter (HOSPITAL_COMMUNITY): Payer: Self-pay

## 2017-12-01 ENCOUNTER — Other Ambulatory Visit: Payer: Self-pay

## 2017-12-01 DIAGNOSIS — E1122 Type 2 diabetes mellitus with diabetic chronic kidney disease: Secondary | ICD-10-CM | POA: Diagnosis present

## 2017-12-01 DIAGNOSIS — N390 Urinary tract infection, site not specified: Secondary | ICD-10-CM

## 2017-12-01 DIAGNOSIS — E785 Hyperlipidemia, unspecified: Secondary | ICD-10-CM | POA: Diagnosis present

## 2017-12-01 DIAGNOSIS — K5720 Diverticulitis of large intestine with perforation and abscess without bleeding: Secondary | ICD-10-CM | POA: Diagnosis present

## 2017-12-01 DIAGNOSIS — I120 Hypertensive chronic kidney disease with stage 5 chronic kidney disease or end stage renal disease: Secondary | ICD-10-CM | POA: Diagnosis present

## 2017-12-01 DIAGNOSIS — I10 Essential (primary) hypertension: Secondary | ICD-10-CM | POA: Diagnosis not present

## 2017-12-01 DIAGNOSIS — M329 Systemic lupus erythematosus, unspecified: Secondary | ICD-10-CM | POA: Diagnosis present

## 2017-12-01 DIAGNOSIS — Z794 Long term (current) use of insulin: Secondary | ICD-10-CM | POA: Diagnosis not present

## 2017-12-01 DIAGNOSIS — K5792 Diverticulitis of intestine, part unspecified, without perforation or abscess without bleeding: Secondary | ICD-10-CM

## 2017-12-01 DIAGNOSIS — N3000 Acute cystitis without hematuria: Secondary | ICD-10-CM

## 2017-12-01 DIAGNOSIS — K921 Melena: Secondary | ICD-10-CM | POA: Diagnosis not present

## 2017-12-01 DIAGNOSIS — Z22330 Carrier of Group B streptococcus: Secondary | ICD-10-CM | POA: Diagnosis not present

## 2017-12-01 DIAGNOSIS — IMO0002 Reserved for concepts with insufficient information to code with codable children: Secondary | ICD-10-CM | POA: Diagnosis present

## 2017-12-01 DIAGNOSIS — E119 Type 2 diabetes mellitus without complications: Secondary | ICD-10-CM

## 2017-12-01 DIAGNOSIS — Z992 Dependence on renal dialysis: Secondary | ICD-10-CM | POA: Diagnosis not present

## 2017-12-01 DIAGNOSIS — Z880 Allergy status to penicillin: Secondary | ICD-10-CM | POA: Diagnosis not present

## 2017-12-01 DIAGNOSIS — N186 End stage renal disease: Secondary | ICD-10-CM | POA: Diagnosis present

## 2017-12-01 HISTORY — DX: Reserved for concepts with insufficient information to code with codable children: IMO0002

## 2017-12-01 HISTORY — DX: Systemic lupus erythematosus, unspecified: M32.9

## 2017-12-01 HISTORY — DX: Type 2 diabetes mellitus without complications: E11.9

## 2017-12-01 HISTORY — DX: Urinary tract infection, site not specified: N39.0

## 2017-12-01 HISTORY — DX: Dependence on renal dialysis: Z99.2

## 2017-12-01 HISTORY — DX: Diverticulitis of intestine, part unspecified, without perforation or abscess without bleeding: K57.92

## 2017-12-01 HISTORY — DX: Essential (primary) hypertension: I10

## 2017-12-01 LAB — CBC
HCT: 37.2 % (ref 36.0–46.0)
Hemoglobin: 12 g/dL (ref 12.0–15.0)
MCH: 29.8 pg (ref 26.0–34.0)
MCHC: 32.3 g/dL (ref 30.0–36.0)
MCV: 92.3 fL (ref 78.0–100.0)
PLATELETS: 244 10*3/uL (ref 150–400)
RBC: 4.03 MIL/uL (ref 3.87–5.11)
RDW: 15.5 % (ref 11.5–15.5)
WBC: 18.8 10*3/uL — ABNORMAL HIGH (ref 4.0–10.5)

## 2017-12-01 LAB — COMPREHENSIVE METABOLIC PANEL
ALK PHOS: 67 U/L (ref 38–126)
ALT: 18 U/L (ref 0–44)
AST: 37 U/L (ref 15–41)
Albumin: 3.2 g/dL — ABNORMAL LOW (ref 3.5–5.0)
Anion gap: 15 (ref 5–15)
BUN: 18 mg/dL (ref 6–20)
CO2: 31 mmol/L (ref 22–32)
CREATININE: 5.12 mg/dL — AB (ref 0.44–1.00)
Calcium: 9 mg/dL (ref 8.9–10.3)
Chloride: 97 mmol/L — ABNORMAL LOW (ref 98–111)
GFR calc Af Amer: 11 mL/min — ABNORMAL LOW (ref >60)
GFR calc non Af Amer: 9 mL/min — ABNORMAL LOW (ref >60)
Glucose, Bld: 216 mg/dL — ABNORMAL HIGH (ref 70–99)
Potassium: 3.9 mmol/L (ref 3.5–5.1)
SODIUM: 143 mmol/L (ref 135–145)
Total Bilirubin: 0.5 mg/dL (ref 0.3–1.2)
Total Protein: 7.6 g/dL (ref 6.5–8.1)

## 2017-12-01 LAB — URINALYSIS, ROUTINE W REFLEX MICROSCOPIC
Bilirubin Urine: NEGATIVE
Glucose, UA: 50 mg/dL — AB
Hgb urine dipstick: NEGATIVE
KETONES UR: NEGATIVE mg/dL
Nitrite: NEGATIVE
PH: 9 — AB (ref 5.0–8.0)
Protein, ur: >=300 mg/dL — AB
SPECIFIC GRAVITY, URINE: 1.009 (ref 1.005–1.030)
WBC, UA: >50 WBC/hpf — ABNORMAL HIGH (ref 0–5)

## 2017-12-01 LAB — LIPASE, BLOOD: LIPASE: 53 U/L — AB (ref 11–51)

## 2017-12-01 LAB — I-STAT BETA HCG BLOOD, ED (MC, WL, AP ONLY): I-stat hCG, quantitative: <5 m[IU]/mL (ref <5)

## 2017-12-01 MED ORDER — SODIUM CHLORIDE 0.9 % IV SOLN
1.0000 g | Freq: Once | INTRAVENOUS | Status: AC
  Administered 2017-12-01: 1 g via INTRAVENOUS
  Filled 2017-12-01: qty 10

## 2017-12-01 MED ORDER — SODIUM CHLORIDE 0.9 % IV BOLUS: 1000.0000 mL | Freq: Once | INTRAVENOUS | Status: DC

## 2017-12-01 MED ORDER — MORPHINE SULFATE (PF) 4 MG/ML IV SOLN
4.0000 mg | Freq: Once | INTRAVENOUS | Status: AC
  Administered 2017-12-01: 2 mg via INTRAVENOUS
  Filled 2017-12-01: qty 1

## 2017-12-01 MED ORDER — METRONIDAZOLE IN NACL 5-0.79 MG/ML-% IV SOLN
500.0000 mg | Freq: Once | INTRAVENOUS | Status: AC
  Administered 2017-12-01: 500 mg via INTRAVENOUS
  Filled 2017-12-01: qty 100

## 2017-12-01 MED ORDER — ACETAMINOPHEN 325 MG PO TABS
650.0000 mg | ORAL_TABLET | Freq: Once | ORAL | Status: AC
  Administered 2017-12-01: 650 mg via ORAL
  Filled 2017-12-01: qty 2

## 2017-12-01 MED ORDER — ONDANSETRON HCL 4 MG/2ML IJ SOLN
4.0000 mg | Freq: Once | INTRAMUSCULAR | Status: AC
  Administered 2017-12-01: 4 mg via INTRAVENOUS
  Filled 2017-12-01: qty 2

## 2017-12-01 MED ORDER — CIPROFLOXACIN IN D5W 400 MG/200ML IV SOLN
400.0000 mg | Freq: Once | INTRAVENOUS | Status: AC
  Administered 2017-12-02: 400 mg via INTRAVENOUS
  Filled 2017-12-01: qty 200

## 2017-12-01 MED ORDER — KETOROLAC TROMETHAMINE 30 MG/ML IJ SOLN
15.0000 mg | Freq: Once | INTRAMUSCULAR | Status: AC
  Administered 2017-12-01: 15 mg via INTRAVENOUS
  Filled 2017-12-01: qty 1

## 2017-12-01 NOTE — H&P (Signed)
History and Physical   Paula Bass ZLD:[id number] DOB: [birth year] DOA: 12/01/2017  Referring MD/NP/PA: Dr. Vanita Panda  PCP: Patient, No Pcp Per   Patient coming from: Home  Chief Complaint: Abdominal pain for 3 days  HPI: Paula Bass is a 47 y.o. female with medical history significant of end-stage renal disease on hemodialysis on Tuesdays Thursdays and Saturdays, hypertension, diabetes, who presented to the ER with left lower quadrant abdominal pain for 3 days.  Pain is rated as 6 out of 10 persistent and getting worse in the last 2 days.  It is sharp shooting pain no radiating.  It is located in the left lower quadrant and constant.  She has had some diarrhea but no vomiting.  Patient did not take anything.  She has end-stage renal disease on hemodialysis.  Patient has had dialysis today.  Patient also has history of lupus and diabetes with good blood pressure and blood sugar control.  ED Course: Temperature 102.3, blood pressure 164/84, pulse 108, respiratory of 19 oxygen sat 100% on room air.  White count is 18.8 hemoglobin 12.0 and platelet count of 244.  Sodium 143 potassium 3.9, chloride 97 creatinine 5.12 and glucose 216.  Urinalysis showed large leukocytes many bacteria and WBC more than 50 with no hematuria.  CT abdomen and pelvis showed distal colon sigmoid diverticulitis with focal microperforation.  Bilateral kidney masses which are benign.  Review of Systems: As per HPI otherwise 10 point review of systems negative.    Past Medical History:  Diagnosis Date  . Diabetes mellitus without complication ([place])   . Dialysis patient ([place])   . Hypertension   . Lupus [facility])     Past Surgical History:  Procedure Laterality Date  . CARDIAC SURGERY     cardiac shunt     reports that she has never smoked. She has never used smokeless tobacco. She reports that she does not drink alcohol or use drugs.  Allergies  Allergen Reactions  . Penicillins Hives    No family history on  file.   Prior to Admission medications   Not on File    Physical Exam: Vitals:   12/01/17 1751 12/01/17 2045 12/01/17 2318 12/01/17 2324  BP: (!) 152/79 (!) 153/82 (!) 164/84 (!) 164/84  Pulse: (!) 104 95 (!) 108 (!) 107  Resp: 19 16    Temp: 99.7 F (37.6 C)   (!) 102.3 F (39.1 C)  TempSrc: Oral   Oral  SpO2: 98% 97% 100% 100%  Weight: 68 kg     Height: 5\' 2"  (1.575 m)         Constitutional: NAD, calm, comfortable Vitals:   12/01/17 1751 12/01/17 2045 12/01/17 2318 12/01/17 2324  BP: (!) 152/79 (!) 153/82 (!) 164/84 (!) 164/84  Pulse: (!) 104 95 (!) 108 (!) 107  Resp: 19 16    Temp: 99.7 F (37.6 C)   (!) 102.3 F (39.1 C)  TempSrc: Oral   Oral  SpO2: 98% 97% 100% 100%  Weight: 68 kg     Height: 5\' 2"  (1.575 m)      Eyes: PERRL, lids and conjunctivae normal ENMT: Mucous membranes are moist. Posterior pharynx clear of any exudate or lesions.Normal dentition.  Neck: normal, supple, no masses, no thyromegaly Respiratory: clear to auscultation bilaterally, no wheezing, no crackles. Normal respiratory effort. No accessory muscle use.  Cardiovascular: Regular rate and rhythm, no murmurs / rubs / gallops. No extremity edema. 2+ pedal pulses. No carotid bruits.  Abdomen: Left lower quadrant  abdominal tenderness, no masses palpated. No hepatosplenomegaly. Bowel sounds positive.  Musculoskeletal: no clubbing / cyanosis. No joint deformity upper and lower extremities. Good ROM, no contractures. Normal muscle tone.  Skin: no rashes, lesions, ulcers. No induration Neurologic: CN 2-12 grossly intact. Sensation intact, DTR normal. Strength 5/5 in all 4.  Psychiatric: Normal judgment and insight. Alert and oriented x 3. Normal mood.     Labs on Admission: I have personally reviewed following labs and imaging studies  CBC: Recent Labs  Lab 12/01/17 2034  WBC 18.8*  HGB 12.0  HCT 37.2  MCV 92.3  PLT 194   Basic Metabolic Panel: Recent Labs  Lab 12/01/17 2034  NA  143  K 3.9  CL 97*  CO2 31  GLUCOSE 216*  BUN 18  CREATININE 5.12*  CALCIUM 9.0   GFR: Estimated Creatinine Clearance: 12.6 mL/min (A) (by C-G formula based on SCr of 5.12 mg/dL (H)). Liver Function Tests: Recent Labs  Lab 12/01/17 2034  AST 37  ALT 18  ALKPHOS 67  BILITOT 0.5  PROT 7.6  ALBUMIN 3.2*   Recent Labs  Lab 12/01/17 2034  LIPASE 53*   No results for input(s): AMMONIA in the last 168 hours. Coagulation Profile: No results for input(s): INR, PROTIME in the last 168 hours. Cardiac Enzymes: No results for input(s): CKTOTAL, CKMB, CKMBINDEX, TROPONINI in the last 168 hours. BNP (last 3 results) No results for input(s): PROBNP in the last 8760 hours. HbA1C: No results for input(s): HGBA1C in the last 72 hours. CBG: No results for input(s): GLUCAP in the last 168 hours. Lipid Profile: No results for input(s): CHOL, HDL, LDLCALC, TRIG, CHOLHDL, LDLDIRECT in the last 72 hours. Thyroid Function Tests: No results for input(s): TSH, T4TOTAL, FREET4, T3FREE, THYROIDAB in the last 72 hours. Anemia Panel: No results for input(s): VITAMINB12, FOLATE, FERRITIN, TIBC, IRON, RETICCTPCT in the last 72 hours. Urine analysis:    Component Value Date/Time   COLORURINE YELLOW 12/01/2017 2017   APPEARANCEUR HAZY (A) 12/01/2017 2017   LABSPEC 1.009 12/01/2017 2017   PHURINE 9.0 (H) 12/01/2017 2017   GLUCOSEU 50 (A) 12/01/2017 2017   HGBUR NEGATIVE 12/01/2017 2017   BILIRUBINUR NEGATIVE 12/01/2017 2017   KETONESUR NEGATIVE 12/01/2017 2017   PROTEINUR >=300 (A) 12/01/2017 2017   NITRITE NEGATIVE 12/01/2017 2017   LEUKOCYTESUR LARGE (A) 12/01/2017 2017   Sepsis Labs: @LABRCNTIP (procalcitonin:4,lacticidven:4) )No results found for this or any previous visit (from the past 240 hour(s)).   Radiological Exams on Admission: Ct Abdomen Pelvis Wo Contrast  Result Date: 12/01/2017 CLINICAL DATA:  Left lower quadrant pain for a few days. EXAM: CT ABDOMEN AND PELVIS WITHOUT  CONTRAST TECHNIQUE: Multidetector CT imaging of the abdomen and pelvis was performed following the standard protocol without IV contrast. COMPARISON:  None. FINDINGS: Lower chest: No acute abnormality. Hepatobiliary: No focal liver abnormality is seen. No gallstones, gallbladder wall thickening, or biliary dilatation. Pancreas: Unremarkable. No pancreatic ductal dilatation or surrounding inflammatory changes. Spleen: Normal in size without focal abnormality. Adrenals/Urinary Tract: Adrenal glands are normal. There is a 2.8 cm mass, exophytic off the lower left kidney with peripheral rim-like calcification and a central attenuation of 12 Hounsfield units. There is a mass in the inferior pole the right kidney with a central attenuation of 13 Hounsfield units, consistent with a cyst with peripheral rim-like calcification. Other right renal cysts are noted. There appear to be a few punctate/small stones in both kidneys without hydronephrosis. Mild bilateral perinephric stranding is symmetric with no evidence  of acute inflammation. The ureters and bladder are normal in appearance. Stomach/Bowel: The stomach and small bowel are normal. There is fat stranding surrounding the junction of the distal descending and proximal sigmoid colon. There are diverticula in this region. No extraluminal fluid collection. There is extraluminal gas as seen on series 2, image 49 likely due to a micro perforation. No other free air in the abdomen. Other scattered colonic diverticuli with no other sites of diverticulitis. The remainder of the colon is unremarkable. The appendix is not seen but there is no secondary evidence of appendicitis. Vascular/Lymphatic: Atherosclerotic changes are seen in the nonaneurysmal aorta without adenopathy. Reproductive: Uterus and bilateral adnexa are unremarkable. Other: No additional abnormalities. Musculoskeletal: No acute or significant osseous findings. IMPRESSION: 1. The abnormality at the junction of  the distal descending colon and proximal sigmoid colon is most consistent with diverticulitis. A small amount of focal adjacent extraluminal air is consistent with a focal micro perforation. No abscess or pericolonic fluid collections. No other free air. Recommend follow-up to ensure complete resolution. 2. Both kidneys contain and mass which appears cystic centrally with peripheral eggshell like calcification. These are consistent with Bosniak 2 lesions requiring no follow-up. 3. Nonobstructive stones in both kidneys. 4. Atherosclerotic changes in the nonaneurysmal aorta. Electronically Signed   By: Dorise Bullion III M.D   On: 12/01/2017 22:04     Assessment/Plan Active Problems:   Acute diverticulitis   ESRD (end stage renal disease) ([place])   DM2 (diabetes mellitus, type 2) ([place])   Lupus ([place])   Benign essential HTN   UTI (urinary tract infection)     #1 acute diverticulitis: With microperforation.  Surgery consulted.  Patient will be maintained on IV Cipro Flagyl.  Clear liquid diet.  Monitor closely.  #2 end-stage renal disease: On hemodialysis on Tuesdays Thursdays and Saturdays.  Patient already had hemodialysis today.  Next hemodialysis will be Tuesday.  #3 diabetes: Initial sliding scale insulin with home regimen.  #4 hypertension: Blood pressure is controlled.  Continue home regimen  #5 UTI: Patient will be on Cipro for now.  Urine culture sensitivities ordered.  Blood culture and sensitivities also ordered.   DVT prophylaxis: Lovenox Code Status: Full code Family Communication: No family available Disposition Plan: Home Consults called: Dr. Sherren Mocha general surgery Admission status: Inpatient  Severity of Illness: The appropriate patient status for this patient is INPATIENT. Inpatient status is judged to be reasonable and necessary in order to provide the required intensity of service to ensure the patient's safety. The patient's presenting symptoms, physical exam findings,  and initial radiographic and laboratory data in the context of their chronic comorbidities is felt to place them at high risk for further clinical deterioration. Furthermore, it is not anticipated that the patient will be medically stable for discharge from the hospital within 2 midnights of admission. The following factors support the patient status of inpatient.   " The patient's presenting symptoms include abdominal pain with some diarrhea. " The worrisome physical exam findings include tender left lower quadrant. " The initial radiographic and laboratory data are worrisome because of CT abdomen showing diverticulitis. " The chronic co-morbidities include history of end-stage renal disease with diabetes.   * I certify that at the point of admission it is my clinical judgment that the patient will require inpatient hospital care spanning beyond 2 midnights from the point of admission due to high intensity of service, high risk for further deterioration and high frequency of surveillance required.*  Barbette Merino MD [facility] Pager [phone]  If 7PM-7AM, please contact night-coverage [email or web address] Password [email or web address]  12/01/2017, 11:35 PM

## 2017-12-01 NOTE — ED Triage Notes (Signed)
Pt states that she has been having LLQ pain for a few days. Pt states that she had diarrhea, which has since cleared up. Pt denies N/V.

## 2017-12-01 NOTE — ED Notes (Signed)
Pt stated being hard stick, blood draw limited to right arm, ac attempted, unsuccessful

## 2017-12-01 NOTE — ED Provider Notes (Signed)
[facility] DEPT Provider Note   CSN: [id number] Arrival date & time: 12/01/17  1747     History   Chief Complaint Chief Complaint  Patient presents with  . Abdominal Pain    HPI Paula Bass is a 47 y.o. female.  The history is provided by the patient. No language interpreter was used.  Abdominal Pain      47 year old female with history of diabetes, dialysis patient, hypertension, lupus presenting for evaluation of abdominal pain.  Patient report for the past 3 days she has had persistent pain to her left lower quadrant.  She described pain as a sharp shooting nonradiating sensation, constant, with associated subjective fever and some mild loose stools.  Pain worsening when she coughs when she moves and improves when she lays flat.  She denies any change in her diet or any decreased appetite, no chest pain shortness of breath productive cough, back pain dysuria hematuria vaginal bleeding or vaginal discharge.  No specific treatment tried at home.  Currently rates pain as 5 out of 10.  Past Medical History:  Diagnosis Date  . Diabetes mellitus without complication ([place])   . Dialysis patient ([place])   . Hypertension   . Lupus ([place])     There are no active problems to display for this patient.   Past Surgical History:  Procedure Laterality Date  . CARDIAC SURGERY     cardiac shunt     OB History   None      Home Medications    Prior to Admission medications   Not on File    Family History No family history on file.  Social History Social History   Tobacco Use  . Smoking status: Never Smoker  . Smokeless tobacco: Never Used  Substance Use Topics  . Alcohol use: Never    Frequency: Never  . Drug use: Never     Allergies   Penicillins   Review of Systems Review of Systems  Gastrointestinal: Positive for abdominal pain.  All other systems reviewed and are negative.    Physical Exam Updated Vital Signs BP (!)  152/79 (BP Location: Right Arm)   Pulse (!) 104   Temp 99.7 F (37.6 C) (Oral)   Resp 19   Ht 5\' 2"  (1.575 m)   Wt 68 kg   SpO2 98%   BMI 27.44 kg/m   Physical Exam  Constitutional: She appears well-developed and well-nourished. No distress.  Patient is well-appearing no acute discomfort.  HENT:  Head: Atraumatic.  Eyes: Conjunctivae are normal.  Neck: Neck supple.  Cardiovascular:  Mild tachycardia without murmur rubs or gallops  Pulmonary/Chest: Effort normal and breath sounds normal. She has no wheezes. She has no rales.  Abdominal: Normal appearance. There is tenderness in the left lower quadrant.  Neurological: She is alert.  Skin: No rash noted.  Left upper arm AV fistula with palpable thrills and bruit.  Psychiatric: She has a normal mood and affect.  Nursing note and vitals reviewed.    ED Treatments / Results  Labs (all labs ordered are listed, but only abnormal results are displayed) Labs Reviewed  LIPASE, BLOOD - Abnormal; Notable for the following components:      Result Value   Lipase 53 (*)    All other components within normal limits  COMPREHENSIVE METABOLIC PANEL - Abnormal; Notable for the following components:   Chloride 97 (*)    Glucose, Bld 216 (*)    Creatinine, Ser 5.12 (*)  Albumin 3.2 (*)    GFR calc non Af Amer 9 (*)    GFR calc Af Amer 11 (*)    All other components within normal limits  CBC - Abnormal; Notable for the following components:   WBC 18.8 (*)    All other components within normal limits  URINALYSIS, ROUTINE W REFLEX MICROSCOPIC - Abnormal; Notable for the following components:   APPearance HAZY (*)    pH 9.0 (*)    Glucose, UA 50 (*)    Protein, ur >=300 (*)    Leukocytes, UA LARGE (*)    WBC, UA >50 (*)    Bacteria, UA MANY (*)    All other components within normal limits  URINE CULTURE  I-STAT BETA HCG BLOOD, ED ([facility], [facility], [facility] ONLY)    EKG None  Radiology Ct Abdomen Pelvis Wo Contrast  Result Date:  12/01/2017 CLINICAL DATA:  Left lower quadrant pain for a few days. EXAM: CT ABDOMEN AND PELVIS WITHOUT CONTRAST TECHNIQUE: Multidetector CT imaging of the abdomen and pelvis was performed following the standard protocol without IV contrast. COMPARISON:  None. FINDINGS: Lower chest: No acute abnormality. Hepatobiliary: No focal liver abnormality is seen. No gallstones, gallbladder wall thickening, or biliary dilatation. Pancreas: Unremarkable. No pancreatic ductal dilatation or surrounding inflammatory changes. Spleen: Normal in size without focal abnormality. Adrenals/Urinary Tract: Adrenal glands are normal. There is a 2.8 cm mass, exophytic off the lower left kidney with peripheral rim-like calcification and a central attenuation of 12 Hounsfield units. There is a mass in the inferior pole the right kidney with a central attenuation of 13 Hounsfield units, consistent with a cyst with peripheral rim-like calcification. Other right renal cysts are noted. There appear to be a few punctate/small stones in both kidneys without hydronephrosis. Mild bilateral perinephric stranding is symmetric with no evidence of acute inflammation. The ureters and bladder are normal in appearance. Stomach/Bowel: The stomach and small bowel are normal. There is fat stranding surrounding the junction of the distal descending and proximal sigmoid colon. There are diverticula in this region. No extraluminal fluid collection. There is extraluminal gas as seen on series 2, image 49 likely due to a micro perforation. No other free air in the abdomen. Other scattered colonic diverticuli with no other sites of diverticulitis. The remainder of the colon is unremarkable. The appendix is not seen but there is no secondary evidence of appendicitis. Vascular/Lymphatic: Atherosclerotic changes are seen in the nonaneurysmal aorta without adenopathy. Reproductive: Uterus and bilateral adnexa are unremarkable. Other: No additional abnormalities.  Musculoskeletal: No acute or significant osseous findings. IMPRESSION: 1. The abnormality at the junction of the distal descending colon and proximal sigmoid colon is most consistent with diverticulitis. A small amount of focal adjacent extraluminal air is consistent with a focal micro perforation. No abscess or pericolonic fluid collections. No other free air. Recommend follow-up to ensure complete resolution. 2. Both kidneys contain and mass which appears cystic centrally with peripheral eggshell like calcification. These are consistent with Bosniak 2 lesions requiring no follow-up. 3. Nonobstructive stones in both kidneys. 4. Atherosclerotic changes in the nonaneurysmal aorta. Electronically Signed   By: Dorise Bullion III M.D   On: 12/01/2017 22:04    Procedures Procedures (including critical care time)  Medications Ordered in ED Medications  ciprofloxacin (CIPRO) IVPB 400 mg (has no administration in time range)  metroNIDAZOLE (FLAGYL) IVPB 500 mg (500 mg Intravenous New Bag/Given 12/01/17 2243)  morphine 4 MG/ML injection 4 mg (2 mg Intravenous Given 12/01/17 2103)  ondansetron [facility]) injection 4 mg (4 mg Intravenous Given 12/01/17 2102)  cefTRIAXone (ROCEPHIN) 1 g in sodium chloride 0.9 % 100 mL IVPB (0 g Intravenous Stopped 12/01/17 2242)     Initial Impression / Assessment and Plan / ED Course  I have reviewed the triage vital signs and the nursing notes.  Pertinent labs & imaging results that were available during my care of the patient were reviewed by me and considered in my medical decision making (see chart for details).     BP (!) 164/84 (BP Location: Right Arm)   Pulse (!) 108   Temp 99.7 F (37.6 C) (Oral)   Resp 16   Ht 5\' 2"  (1.575 m)   Wt 68 kg   SpO2 100%   BMI 27.44 kg/m    Final Clinical Impressions(s) / ED Diagnoses   Final diagnoses:  Acute diverticulitis    ED Discharge Orders    None     7:59 PM Patient here with subjective fever, lower  abdominal pain and loose stools.  She does have some tenderness to her left lower quadrant on exam.  She is a dialysis patient.  She would benefit from an abdominal pelvic CT scan for further evaluation.  Pain medication given.  9:27 PM Patient patient is still making urine.  Her urine shows signs of urinary tract infection.  She also has elevated white count of 18.8.  She would benefit from an abdominal pelvic CT scan.  Her creatinine is 5.12 however she has normal potassium.  Patient's last dialysis was today.  10:22 PM CT shows diverticulitis with microperforation.  Pt started on cipro/flagyl.  I have consulted with oncall surgeon Dr. Sherren Mocha who recommend medicine admission and he will be available for consultation.   11:02 PM Appreciate consultation with [facility] Dr. Jonelle Sidle who agrees to see and admit pt for further care.     Domenic Moras, PA-C 12/01/17 2322    Carmin Muskrat, MD 12/01/17 [phone]

## 2017-12-02 ENCOUNTER — Other Ambulatory Visit: Payer: Self-pay

## 2017-12-02 ENCOUNTER — Encounter (HOSPITAL_COMMUNITY): Payer: Self-pay | Admitting: Surgery

## 2017-12-02 DIAGNOSIS — M329 Systemic lupus erythematosus, unspecified: Secondary | ICD-10-CM

## 2017-12-02 DIAGNOSIS — Z794 Long term (current) use of insulin: Secondary | ICD-10-CM

## 2017-12-02 DIAGNOSIS — E119 Type 2 diabetes mellitus without complications: Secondary | ICD-10-CM

## 2017-12-02 LAB — COMPREHENSIVE METABOLIC PANEL
ALK PHOS: 52 U/L (ref 38–126)
ALT: 17 U/L (ref 0–44)
AST: 29 U/L (ref 15–41)
Albumin: 2.4 g/dL — ABNORMAL LOW (ref 3.5–5.0)
Anion gap: 11 (ref 5–15)
BUN: 24 mg/dL — AB (ref 6–20)
CALCIUM: 7.9 mg/dL — AB (ref 8.9–10.3)
CO2: 31 mmol/L (ref 22–32)
CREATININE: 6.32 mg/dL — AB (ref 0.44–1.00)
Chloride: 95 mmol/L — ABNORMAL LOW (ref 98–111)
GFR, EST AFRICAN AMERICAN: 8 mL/min — AB (ref >60)
GFR, EST NON AFRICAN AMERICAN: 7 mL/min — AB (ref >60)
Glucose, Bld: 108 mg/dL — ABNORMAL HIGH (ref 70–99)
Potassium: 3.8 mmol/L (ref 3.5–5.1)
Sodium: 137 mmol/L (ref 135–145)
Total Bilirubin: 0.6 mg/dL (ref 0.3–1.2)
Total Protein: 6 g/dL — ABNORMAL LOW (ref 6.5–8.1)

## 2017-12-02 LAB — CBC
HCT: 27.7 % — ABNORMAL LOW (ref 36.0–46.0)
Hemoglobin: 8.9 g/dL — ABNORMAL LOW (ref 12.0–15.0)
MCH: 30 pg (ref 26.0–34.0)
MCHC: 32.1 g/dL (ref 30.0–36.0)
MCV: 93.3 fL (ref 78.0–100.0)
PLATELETS: 232 10*3/uL (ref 150–400)
RBC: 2.97 MIL/uL — AB (ref 3.87–5.11)
RDW: 15.6 % — AB (ref 11.5–15.5)
WBC: 21.8 10*3/uL — ABNORMAL HIGH (ref 4.0–10.5)

## 2017-12-02 LAB — GLUCOSE, CAPILLARY
GLUCOSE-CAPILLARY: 99 mg/dL (ref 70–99)
GLUCOSE-CAPILLARY: 85 mg/dL (ref 70–99)
GLUCOSE-CAPILLARY: 162 mg/dL — AB (ref 70–99)
Glucose-Capillary: 138 mg/dL — ABNORMAL HIGH (ref 70–99)
Glucose-Capillary: 121 mg/dL — ABNORMAL HIGH (ref 70–99)

## 2017-12-02 MED ORDER — CIPROFLOXACIN IN D5W 200 MG/100ML IV SOLN
200.0000 mg | INTRAVENOUS | Status: DC
  Administered 2017-12-02 – 2017-12-03 (×2): 200 mg via INTRAVENOUS
  Filled 2017-12-02 (×3): qty 100

## 2017-12-02 MED ORDER — ATORVASTATIN CALCIUM 40 MG PO TABS
40.0000 mg | ORAL_TABLET | Freq: Every day | ORAL | Status: DC
  Administered 2017-12-02 – 2017-12-03 (×2): 40 mg via ORAL
  Filled 2017-12-02 (×2): qty 1

## 2017-12-02 MED ORDER — INSULIN ASPART 100 UNIT/ML ~~LOC~~ SOLN: 0.0000 [IU] | Freq: Every day | SUBCUTANEOUS | Status: DC

## 2017-12-02 MED ORDER — METRONIDAZOLE IN NACL 5-0.79 MG/ML-% IV SOLN
500.0000 mg | Freq: Three times a day (TID) | INTRAVENOUS | Status: DC
  Administered 2017-12-02 – 2017-12-04 (×7): 500 mg via INTRAVENOUS
  Filled 2017-12-02 (×7): qty 100

## 2017-12-02 MED ORDER — HEPARIN SODIUM (PORCINE) 5000 UNIT/ML IJ SOLN
5000.0000 [IU] | Freq: Three times a day (TID) | INTRAMUSCULAR | Status: DC
  Administered 2017-12-02 – 2017-12-04 (×5): 5000 [IU] via SUBCUTANEOUS
  Filled 2017-12-02 (×5): qty 1

## 2017-12-02 MED ORDER — SODIUM CHLORIDE 0.9% FLUSH
3.0000 mL | Freq: Two times a day (BID) | INTRAVENOUS | Status: DC
  Administered 2017-12-02 – 2017-12-04 (×6): 3 mL via INTRAVENOUS

## 2017-12-02 MED ORDER — ONDANSETRON HCL 4 MG/2ML IJ SOLN
4.0000 mg | Freq: Four times a day (QID) | INTRAMUSCULAR | Status: DC | PRN
  Administered 2017-12-02: 4 mg via INTRAVENOUS
  Filled 2017-12-02: qty 2

## 2017-12-02 MED ORDER — INSULIN ASPART 100 UNIT/ML ~~LOC~~ SOLN
0.0000 [IU] | Freq: Three times a day (TID) | SUBCUTANEOUS | Status: DC
  Administered 2017-12-03: 1 [IU] via SUBCUTANEOUS
  Administered 2017-12-03: 3 [IU] via SUBCUTANEOUS

## 2017-12-02 MED ORDER — ENOXAPARIN SODIUM 30 MG/0.3ML ~~LOC~~ SOLN
30.0000 mg | Freq: Every day | SUBCUTANEOUS | Status: DC
  Administered 2017-12-02: 30 mg via SUBCUTANEOUS
  Filled 2017-12-02: qty 0.3

## 2017-12-02 MED ORDER — PREDNISONE 10 MG PO TABS
10.0000 mg | ORAL_TABLET | Freq: Every day | ORAL | Status: DC
  Administered 2017-12-02 – 2017-12-04 (×3): 10 mg via ORAL
  Filled 2017-12-02 (×3): qty 1

## 2017-12-02 MED ORDER — SODIUM CHLORIDE 0.9% FLUSH: 3.0000 mL | INTRAVENOUS | Status: DC | PRN

## 2017-12-02 MED ORDER — SODIUM CHLORIDE 0.9 % IV SOLN: 250.0000 mL | INTRAVENOUS | Status: DC | PRN

## 2017-12-02 MED ORDER — CIPROFLOXACIN IN D5W 200 MG/100ML IV SOLN
200.0000 mg | Freq: Two times a day (BID) | INTRAVENOUS | Status: DC
  Filled 2017-12-02: qty 100

## 2017-12-02 MED ORDER — ONDANSETRON HCL 4 MG PO TABS: 4.0000 mg | ORAL_TABLET | Freq: Four times a day (QID) | ORAL | Status: DC | PRN

## 2017-12-02 NOTE — Progress Notes (Signed)
PROGRESS NOTE  Paula Bass  YOV:[id number] DOB: 1970-07-12 DOA: 12/01/2017 PCP: Marena Chancy of her nephrologist and has no PCP yet  Brief Narrative: Paula Bass is a 47 y.o. female with a history of SLE on chronic prednisone, ESRD on HD TTS, HTN, T2DM who recently moved from [place] who presented to the ED with constant, worsening, moderate-severe LLQ pain associated with diarrhea without bleeding. She presented after a full HD treatment 10/5 due to persistence of symptoms, found to be febrile to 102.74F, tachycardic, with LLQ tenderness but no peritoneal signs. WBC 18.8k and CT abdomen/pelvis demonstrating likely sigmoid diverticulitis with microperforation. Cipro/flagyl started, surgery consulted, and patient admitted to hospitalist service.  Assessment & Plan: Active Problems:   Acute diverticulitis   ESRD (end stage renal disease) (HCC)   DM2 (diabetes mellitus, type 2) (HCC)   Lupus (HCC)   Benign essential HTN   UTI (urinary tract infection)  Acute diverticulitis with microperforation: 1st episode. Never had colonoscopy.  - Surgery following, appreciate their recommendations. No role for surgery at this time.  - Clear liquids - Pain control - Continue cipro/flagyl - No blood cultures were sent at admission - Will need colonoscopy after resolution  SLE: On chronic prednisone.  - Continue prednisone at current dose, elevate to stress dosing if vitals become unstable.  - Will need to establish care with PCP +/- rheumatology soon, CM consulted  ESRD: [place] [facility] since arriving in [place] about 2 weeks ago, having moved to be with husband from [place].  - Will keep at [facility], pending clinical course. May need transfer to [facility] for routine HD if still inpatient on Tuesday.   T2DM:  - Continue insulin (70/30 40u qAM, 30u qPM), but with modified diet and CBG of 99 this AM, will give SSI and monitor  HTN:  - Holding norvasc, lopressor pending med rec and with low-normal  BP  Hyperlipidemia:  - Continue statin when taking po.  Pyuria: No pneumaturia reported or CV fistula seen.  - On cipro as above pending urine culture. Was also given CTX x1.   DVT prophylaxis: Heparin Code Status: Full Family Communication: Husband at bedside Disposition Plan: Uncertain.  Consultants:   General surgery  Procedures:   None  Antimicrobials:  Ceftriaxone 10/5  Ciprofloxacin 10/5 >>   Flagyl 10/5 >>    Subjective: Pain is slightly improved but remains moderate. No vomiting, but mild nausea. No bleeding noted.   Objective: Vitals:   12/02/17 0045 12/02/17 0108 12/02/17 0606 12/02/17 1352  BP: (!) 90/40 121/69 110/61 114/68  Pulse: (!) 124 (!) 118 86 84  Resp: 18  18 (!) 24  Temp: (!) 102.8 F (39.3 C)  98.6 F (37 C) 98.7 F (37.1 C)  TempSrc: Oral  Oral Oral  SpO2: 93%  97% 93%  Weight:      Height:        Intake/Output Summary (Last 24 hours) at 12/02/2017 1442 Last data filed at 12/02/2017 0745 Gross per 24 hour  Intake 742.99 ml  Output 250 ml  Net 492.99 ml   Filed Weights   12/01/17 1751  Weight: 68 kg    Gen: 47 y.o. female in no distress  Pulm: Non-labored breathing room air. Clear to auscultation bilaterally.  CV: Regular rate and rhythm. No murmur, rub, or gallop. No JVD, no pedal edema. GI: Abdomen soft, tender in LLQ, non-distended, with normoactive bowel sounds. No organomegaly or masses felt. Ext: Warm, no deformities. LUA AVF +thrill Skin: No rashes, lesions  or ulcers Neuro: Alert and oriented. No focal neurological deficits. Psych: Judgement and insight appear normal. Mood & affect appropriate.   Data Reviewed: I have personally reviewed following labs and imaging studies  CBC: Recent Labs  Lab 12/01/17 2034 12/02/17 0757  WBC 18.8* 21.8*  HGB 12.0 8.9*  HCT 37.2 27.7*  MCV 92.3 93.3  PLT 244 034   Basic Metabolic Panel: Recent Labs  Lab 12/01/17 2034 12/02/17 0757  NA 143 137  K 3.9 3.8  CL 97* 95*   CO2 31 31  GLUCOSE 216* 108*  BUN 18 24*  CREATININE 5.12* 6.32*  CALCIUM 9.0 7.9*   GFR: Estimated Creatinine Clearance: 10.2 mL/min (A) (by C-G formula based on SCr of 6.32 mg/dL (H)). Liver Function Tests: Recent Labs  Lab 12/01/17 2034 12/02/17 0757  AST 37 29  ALT 18 17  ALKPHOS 67 52  BILITOT 0.5 0.6  PROT 7.6 6.0*  ALBUMIN 3.2* 2.4*   Recent Labs  Lab 12/01/17 2034  LIPASE 53*   No results for input(s): AMMONIA in the last 168 hours. Coagulation Profile: No results for input(s): INR, PROTIME in the last 168 hours. Cardiac Enzymes: No results for input(s): CKTOTAL, CKMB, CKMBINDEX, TROPONINI in the last 168 hours. BNP (last 3 results) No results for input(s): PROBNP in the last 8760 hours. HbA1C: No results for input(s): HGBA1C in the last 72 hours. CBG: Recent Labs  Lab 12/02/17 0103 12/02/17 0739  GLUCAP 138* 99   Lipid Profile: No results for input(s): CHOL, HDL, LDLCALC, TRIG, CHOLHDL, LDLDIRECT in the last 72 hours. Thyroid Function Tests: No results for input(s): TSH, T4TOTAL, FREET4, T3FREE, THYROIDAB in the last 72 hours. Anemia Panel: No results for input(s): VITAMINB12, FOLATE, FERRITIN, TIBC, IRON, RETICCTPCT in the last 72 hours. Urine analysis:    Component Value Date/Time   COLORURINE YELLOW 12/01/2017 2017   APPEARANCEUR HAZY (A) 12/01/2017 2017   LABSPEC 1.009 12/01/2017 2017   PHURINE 9.0 (H) 12/01/2017 2017   GLUCOSEU 50 (A) 12/01/2017 2017   HGBUR NEGATIVE 12/01/2017 2017   BILIRUBINUR NEGATIVE 12/01/2017 2017   [place] NEGATIVE 12/01/2017 2017   PROTEINUR >=300 (A) 12/01/2017 2017   NITRITE NEGATIVE 12/01/2017 2017   LEUKOCYTESUR LARGE (A) 12/01/2017 2017   No results found for this or any previous visit (from the past 240 hour(s)).    Radiology Studies: Ct Abdomen Pelvis Wo Contrast  Result Date: 12/01/2017 CLINICAL DATA:  Left lower quadrant pain for a few days. EXAM: CT ABDOMEN AND PELVIS WITHOUT CONTRAST TECHNIQUE:  Multidetector CT imaging of the abdomen and pelvis was performed following the standard protocol without IV contrast. COMPARISON:  None. FINDINGS: Lower chest: No acute abnormality. Hepatobiliary: No focal liver abnormality is seen. No gallstones, gallbladder wall thickening, or biliary dilatation. Pancreas: Unremarkable. No pancreatic ductal dilatation or surrounding inflammatory changes. Spleen: Normal in size without focal abnormality. Adrenals/Urinary Tract: Adrenal glands are normal. There is a 2.8 cm mass, exophytic off the lower left kidney with peripheral rim-like calcification and a central attenuation of 12 Hounsfield units. There is a mass in the inferior pole the right kidney with a central attenuation of 13 Hounsfield units, consistent with a cyst with peripheral rim-like calcification. Other right renal cysts are noted. There appear to be a few punctate/small stones in both kidneys without hydronephrosis. Mild bilateral perinephric stranding is symmetric with no evidence of acute inflammation. The ureters and bladder are normal in appearance. Stomach/Bowel: The stomach and small bowel are normal. There is fat stranding  surrounding the junction of the distal descending and proximal sigmoid colon. There are diverticula in this region. No extraluminal fluid collection. There is extraluminal gas as seen on series 2, image 49 likely due to a micro perforation. No other free air in the abdomen. Other scattered colonic diverticuli with no other sites of diverticulitis. The remainder of the colon is unremarkable. The appendix is not seen but there is no secondary evidence of appendicitis. Vascular/Lymphatic: Atherosclerotic changes are seen in the nonaneurysmal aorta without adenopathy. Reproductive: Uterus and bilateral adnexa are unremarkable. Other: No additional abnormalities. Musculoskeletal: No acute or significant osseous findings. IMPRESSION: 1. The abnormality at the junction of the distal descending  colon and proximal sigmoid colon is most consistent with diverticulitis. A small amount of focal adjacent extraluminal air is consistent with a focal micro perforation. No abscess or pericolonic fluid collections. No other free air. Recommend follow-up to ensure complete resolution. 2. Both kidneys contain and mass which appears cystic centrally with peripheral eggshell like calcification. These are consistent with Bosniak 2 lesions requiring no follow-up. 3. Nonobstructive stones in both kidneys. 4. Atherosclerotic changes in the nonaneurysmal aorta. Electronically Signed   By: Dorise Bullion III M.D   On: 12/01/2017 22:04    Scheduled Meds: . enoxaparin (LOVENOX) injection  30 mg Subcutaneous Daily  . insulin aspart  0-5 Units Subcutaneous QHS  . insulin aspart  0-9 Units Subcutaneous TID WC  . sodium chloride flush  3 mL Intravenous Q12H   Continuous Infusions: . sodium chloride    . ciprofloxacin    . metronidazole Stopped (12/02/17 0634)     LOS: 1 day   Time spent: 25 minutes.  Patrecia Pour, MD [facility] [email or web address] Password [email or web address] 12/02/2017, 2:42 PM

## 2017-12-02 NOTE — Consult Note (Signed)
General Surgery [facility]  Reason for Consult: acute diverticulitis with microperforation  Referring Physician: Dr. Lonny Prude, [facility] ER; Dr. Bonner Puna, [facility]  Paula Bass is an 47 y.o. female.  HPI: patient is a 47 yo BF with multiple medical problems.  Patient presents with 3 day hx of abdominal pain localized to the left lower quadrant.  Patient experienced nausea.  She presented to the ER for evaluation.  WBC elevated at 18K.  CTA positive for focal acute diverticulitis with microperforation.  Patient admitted to medical service for management.  Antibiotics initiated.  NPO currently.  No prior abdominal surgery per patient.  Past Medical History:  Diagnosis Date  . Diabetes mellitus without complication ([place])   . Dialysis patient ([place])   . Hypertension   . Lupus [facility])     Past Surgical History:  Procedure Laterality Date  . CARDIAC SURGERY     cardiac shunt    History reviewed. No pertinent family history.  Social History:  reports that she has never smoked. She has never used smokeless tobacco. She reports that she does not drink alcohol or use drugs.  Allergies:  Allergies  Allergen Reactions  . Penicillins Hives    Medications: I have reviewed the patient's current medications.  Results for orders placed or performed during the hospital encounter of 12/01/17 (from the past 48 hour(s))  Urinalysis, Routine w reflex microscopic     Status: Abnormal   Collection Time: 12/01/17  8:17 PM  Result Value Ref Range   Color, Urine YELLOW YELLOW   APPearance HAZY (A) CLEAR   Specific Gravity, Urine 1.009 1.005 - 1.030   pH 9.0 (H) 5.0 - 8.0   Glucose, UA 50 (A) NEGATIVE mg/dL   Hgb urine dipstick NEGATIVE NEGATIVE   Bilirubin Urine NEGATIVE NEGATIVE   Ketones, ur NEGATIVE NEGATIVE mg/dL   Protein, ur >=300 (A) NEGATIVE mg/dL   Nitrite NEGATIVE NEGATIVE   Leukocytes, UA LARGE (A) NEGATIVE   RBC / HPF 0-5 0 - 5 RBC/hpf   WBC, UA >50 (H) 0 - 5  WBC/hpf   Bacteria, UA MANY (A) NONE SEEN   Squamous Epithelial / LPF 0-5 0 - 5   Mucus PRESENT     Comment: Performed at [facility], [place] [address] [place], [place] [address]  Lipase, blood     Status: Abnormal   Collection Time: 12/01/17  8:34 PM  Result Value Ref Range   Lipase 53 (H) 11 - 51 U/L    Comment: Performed at [facility], [place] [address] [place], [place] [address]  Comprehensive metabolic panel     Status: Abnormal   Collection Time: 12/01/17  8:34 PM  Result Value Ref Range   Sodium 143 135 - 145 mmol/L   Potassium 3.9 3.5 - 5.1 mmol/L   Chloride 97 (L) 98 - 111 mmol/L   CO2 31 22 - 32 mmol/L   Glucose, Bld 216 (H) 70 - 99 mg/dL   BUN 18 6 - 20 mg/dL   Creatinine, Ser 5.12 (H) 0.44 - 1.00 mg/dL   Calcium 9.0 8.9 - 10.3 mg/dL   Total Protein 7.6 6.5 - 8.1 g/dL   Albumin 3.2 (L) 3.5 - 5.0 g/dL   AST 37 15 - 41 U/L   ALT 18 0 - 44 U/L   Alkaline Phosphatase 67 38 - 126 U/L   Total Bilirubin 0.5 0.3 - 1.2 mg/dL   GFR calc non Af Amer 9 (L) >60  mL/min   GFR calc Af Amer 11 (L) >60 mL/min    Comment: (NOTE) The eGFR has been calculated using the CKD EPI equation. This calculation has not been validated in all clinical situations. eGFR's persistently <60 mL/min signify possible Chronic Kidney Disease.    Anion gap 15 5 - 15    Comment: Performed at [facility], [place] [address] [place], [place] [address]  CBC     Status: Abnormal   Collection Time: 12/01/17  8:34 PM  Result Value Ref Range   WBC 18.8 (H) 4.0 - 10.5 K/uL   RBC 4.03 3.87 - 5.11 MIL/uL   Hemoglobin 12.0 12.0 - 15.0 g/dL   HCT 37.2 36.0 - 46.0 %   MCV 92.3 78.0 - 100.0 fL   MCH 29.8 26.0 - 34.0 pg   MCHC 32.3 30.0 - 36.0 g/dL   RDW 15.5 11.5 - 15.5 %   Platelets 244 150 - 400 K/uL    Comment: Performed at [facility], [place] [address], [place], [place] [address]  I-Stat beta hCG blood, ED     Status: None    Collection Time: 12/01/17  9:16 PM  Result Value Ref Range   I-stat hCG, quantitative <5.0 <5 mIU/mL   Comment 3            Comment:   GEST. AGE      CONC.  (mIU/mL)   <=1 WEEK        5 - 50     2 WEEKS       50 - 500     3 WEEKS       100 - 10,000     4 WEEKS     1,000 - 30,000        FEMALE AND NON-PREGNANT FEMALE:     LESS THAN 5 mIU/mL   Glucose, capillary     Status: Abnormal   Collection Time: 12/02/17  1:03 AM  Result Value Ref Range   Glucose-Capillary 138 (H) 70 - 99 mg/dL    Ct Abdomen Pelvis Wo Contrast  Result Date: 12/01/2017 CLINICAL DATA:  Left lower quadrant pain for a few days. EXAM: CT ABDOMEN AND PELVIS WITHOUT CONTRAST TECHNIQUE: Multidetector CT imaging of the abdomen and pelvis was performed following the standard protocol without IV contrast. COMPARISON:  None. FINDINGS: Lower chest: No acute abnormality. Hepatobiliary: No focal liver abnormality is seen. No gallstones, gallbladder wall thickening, or biliary dilatation. Pancreas: Unremarkable. No pancreatic ductal dilatation or surrounding inflammatory changes. Spleen: Normal in size without focal abnormality. Adrenals/Urinary Tract: Adrenal glands are normal. There is a 2.8 cm mass, exophytic off the lower left kidney with peripheral rim-like calcification and a central attenuation of 12 Hounsfield units. There is a mass in the inferior pole the right kidney with a central attenuation of 13 Hounsfield units, consistent with a cyst with peripheral rim-like calcification. Other right renal cysts are noted. There appear to be a few punctate/small stones in both kidneys without hydronephrosis. Mild bilateral perinephric stranding is symmetric with no evidence of acute inflammation. The ureters and bladder are normal in appearance. Stomach/Bowel: The stomach and small bowel are normal. There is fat stranding surrounding the junction of the distal descending and proximal sigmoid colon. There are diverticula in this region. No  extraluminal fluid collection. There is extraluminal gas as seen on series 2, image 49 likely due to a micro perforation. No other free air in the abdomen. Other scattered colonic diverticuli with  no other sites of diverticulitis. The remainder of the colon is unremarkable. The appendix is not seen but there is no secondary evidence of appendicitis. Vascular/Lymphatic: Atherosclerotic changes are seen in the nonaneurysmal aorta without adenopathy. Reproductive: Uterus and bilateral adnexa are unremarkable. Other: No additional abnormalities. Musculoskeletal: No acute or significant osseous findings. IMPRESSION: 1. The abnormality at the junction of the distal descending colon and proximal sigmoid colon is most consistent with diverticulitis. A small amount of focal adjacent extraluminal air is consistent with a focal micro perforation. No abscess or pericolonic fluid collections. No other free air. Recommend follow-up to ensure complete resolution. 2. Both kidneys contain and mass which appears cystic centrally with peripheral eggshell like calcification. These are consistent with Bosniak 2 lesions requiring no follow-up. 3. Nonobstructive stones in both kidneys. 4. Atherosclerotic changes in the nonaneurysmal aorta. Electronically Signed   By: Dorise Bullion III M.D   On: 12/01/2017 22:04    Review of Systems  Eyes: Negative.   Respiratory: Negative.   Cardiovascular: Negative.   Gastrointestinal: Positive for abdominal pain, diarrhea and nausea.  Genitourinary:       ESRD on HD  Musculoskeletal: Negative.   Neurological: Negative.   Endo/Heme/Allergies: Negative.   Psychiatric/Behavioral: Negative.    Blood pressure 110/61, pulse 86, temperature 98.6 F (37 C), temperature source Oral, resp. rate 18, height '5\' 2"'  (1.575 m), weight 68 kg, SpO2 97 %. Physical Exam  Constitutional: She is oriented to person, place, and time. She appears well-developed and well-nourished. No distress.  HENT:   Head: Normocephalic and atraumatic.  Right Ear: External ear normal.  Left Ear: External ear normal.  Mouth/Throat: No oropharyngeal exudate.  Eyes: Pupils are equal, round, and reactive to light. Conjunctivae are normal. No scleral icterus.  Neck: Normal range of motion. Neck supple. No tracheal deviation present. No thyromegaly present.  Cardiovascular: Normal rate, regular rhythm and normal heart sounds.  Respiratory: Effort normal and breath sounds normal. No respiratory distress. She has no wheezes.  GI: Soft. Bowel sounds are normal. She exhibits no distension and no mass. There is tenderness (left mid abdomen). There is guarding. There is no rebound.  Musculoskeletal: Normal range of motion. She exhibits no edema or deformity.  Neurological: She is alert and oriented to person, place, and time.  Skin: Skin is warm and dry. She is not diaphoretic.  Psychiatric: She has a normal mood and affect. Her behavior is normal.    Assessment/Plan: Acute diverticulitis with microperforation  Agree with medical admission for antibiotics, bowel rest  Allow clear liquid diet today  Surgery will follow with you - no role for operative intervention at this time  Management of comorbidities per medical service  Armandina Gemma, [facility] Office: [place] 12/02/2017, 9:15 AM

## 2017-12-02 NOTE — Progress Notes (Signed)
Rx Brief note: Lovenox  Wt=68 kg, CrCL~12 ml/min  Rx adjusted Lovenox to 30 mg daily in pt with CrCl<30 ml/min  Thanks Dorrene German 12/02/2017 1:21 AM

## 2017-12-02 NOTE — Progress Notes (Signed)
PHARMACY NOTE:  ANTIMICROBIAL RENAL DOSAGE ADJUSTMENT  Current antimicrobial regimen includes a mismatch between antimicrobial dosage and estimated renal function.  As per policy approved by the Pharmacy & Therapeutics and Medical Executive Committees, the antimicrobial dosage will be adjusted accordingly.  Current antimicrobial dosage:  Ciprofloxacin 200 mg IV q12h2  Indication: intra-abdominal infecion  Renal Function:  Estimated Creatinine Clearance: 10.2 mL/min (A) (by C-G formula based on SCr of 6.32 mg/dL (H)).     Antimicrobial dosage has been changed to:  Ciprofloxacin 200 mg IV q24h  Thank you for allowing pharmacy to be a part of this patient's care.  [place], [facility] 12/02/2017 1:12 PM

## 2017-12-02 NOTE — ED Notes (Signed)
ED TO INPATIENT HANDOFF REPORT  Name/Age/Gender Paula Bass 47 y.o. female  Code Status   Home/SNF/Other Home  Chief Complaint Abdominal Pain   Level of Care/Admitting Diagnosis ED Disposition    ED Disposition Condition [facility] Area: [place] [[id number]]  Level of Care: Med-Surg [16]  Diagnosis: Acute diverticulitis [[id number]]  Admitting Physician: Elwyn Reach [[id number]]  Attending Physician: Elwyn Reach [[id number]]  Estimated length of stay: past midnight tomorrow  Certification:: I certify this patient will need inpatient services for at least 2 midnights  PT Class (Do Not Modify): Inpatient [101]  PT Acc Code (Do Not Modify): Private [1]       Medical History Past Medical History:  Diagnosis Date  . Diabetes mellitus without complication ([place])   . Dialysis patient ([place])   . Hypertension   . Lupus ([facility])     Allergies Allergies  Allergen Reactions  . Penicillins Hives    IV Location/Drains/Wounds Patient Lines/Drains/Airways Status   Active Line/Drains/Airways    Name:   Placement date:   Placement time:   Site:   Days:   Peripheral IV 12/01/17 Right;Anterior Forearm   12/01/17    2036    Forearm   1          Labs/Imaging Results for orders placed or performed during the hospital encounter of 12/01/17 (from the past 48 hour(s))  Urinalysis, Routine w reflex microscopic     Status: Abnormal   Collection Time: 12/01/17  8:17 PM  Result Value Ref Range   Color, Urine YELLOW YELLOW   APPearance HAZY (A) CLEAR   Specific Gravity, Urine 1.009 1.005 - 1.030   pH 9.0 (H) 5.0 - 8.0   Glucose, UA 50 (A) NEGATIVE mg/dL   Hgb urine dipstick NEGATIVE NEGATIVE   Bilirubin Urine NEGATIVE NEGATIVE   Ketones, ur NEGATIVE NEGATIVE mg/dL   Protein, ur >=300 (A) NEGATIVE mg/dL   Nitrite NEGATIVE NEGATIVE   Leukocytes, UA LARGE (A) NEGATIVE   RBC / HPF 0-5 0 - 5 RBC/hpf   WBC, UA >50 (H) 0 - 5 WBC/hpf   Bacteria, UA  MANY (A) NONE SEEN   Squamous Epithelial / LPF 0-5 0 - 5   Mucus PRESENT     Comment: Performed at [facility], [place] [address] [place], [place] [address]  Lipase, blood     Status: Abnormal   Collection Time: 12/01/17  8:34 PM  Result Value Ref Range   Lipase 53 (H) 11 - 51 U/L    Comment: Performed at [facility], [place] [address] [place], [place] [address]  Comprehensive metabolic panel     Status: Abnormal   Collection Time: 12/01/17  8:34 PM  Result Value Ref Range   Sodium 143 135 - 145 mmol/L   Potassium 3.9 3.5 - 5.1 mmol/L   Chloride 97 (L) 98 - 111 mmol/L   CO2 31 22 - 32 mmol/L   Glucose, Bld 216 (H) 70 - 99 mg/dL   BUN 18 6 - 20 mg/dL   Creatinine, Ser 5.12 (H) 0.44 - 1.00 mg/dL   Calcium 9.0 8.9 - 10.3 mg/dL   Total Protein 7.6 6.5 - 8.1 g/dL   Albumin 3.2 (L) 3.5 - 5.0 g/dL   AST 37 15 - 41 U/L   ALT 18 0 - 44 U/L   Alkaline Phosphatase 67 38 - 126 U/L   Total Bilirubin 0.5 0.3 - 1.2 mg/dL   GFR  calc non Af Amer 9 (L) >60 mL/min   GFR calc Af Amer 11 (L) >60 mL/min    Comment: (NOTE) The eGFR has been calculated using the CKD EPI equation. This calculation has not been validated in all clinical situations. eGFR's persistently <60 mL/min signify possible Chronic Kidney Disease.    Anion gap 15 5 - 15    Comment: Performed at [facility], [place] [address], [place], [place] [address]  CBC     Status: Abnormal   Collection Time: 12/01/17  8:34 PM  Result Value Ref Range   WBC 18.8 (H) 4.0 - 10.5 K/uL   RBC 4.03 3.87 - 5.11 MIL/uL   Hemoglobin 12.0 12.0 - 15.0 g/dL   HCT 37.2 36.0 - 46.0 %   MCV 92.3 78.0 - 100.0 fL   MCH 29.8 26.0 - 34.0 pg   MCHC 32.3 30.0 - 36.0 g/dL   RDW 15.5 11.5 - 15.5 %   Platelets 244 150 - 400 K/uL    Comment: Performed at [facility], [place] [address] [place], [place] [address]  I-Stat beta hCG blood, ED     Status: None   Collection Time: 12/01/17  9:16  PM  Result Value Ref Range   I-stat hCG, quantitative <5.0 <5 mIU/mL   Comment 3            Comment:   GEST. AGE      CONC.  (mIU/mL)   <=1 WEEK        5 - 50     2 WEEKS       50 - 500     3 WEEKS       100 - 10,000     4 WEEKS     1,000 - 30,000        FEMALE AND NON-PREGNANT FEMALE:     LESS THAN 5 mIU/mL    Ct Abdomen Pelvis Wo Contrast  Result Date: 12/01/2017 CLINICAL DATA:  Left lower quadrant pain for a few days. EXAM: CT ABDOMEN AND PELVIS WITHOUT CONTRAST TECHNIQUE: Multidetector CT imaging of the abdomen and pelvis was performed following the standard protocol without IV contrast. COMPARISON:  None. FINDINGS: Lower chest: No acute abnormality. Hepatobiliary: No focal liver abnormality is seen. No gallstones, gallbladder wall thickening, or biliary dilatation. Pancreas: Unremarkable. No pancreatic ductal dilatation or surrounding inflammatory changes. Spleen: Normal in size without focal abnormality. Adrenals/Urinary Tract: Adrenal glands are normal. There is a 2.8 cm mass, exophytic off the lower left kidney with peripheral rim-like calcification and a central attenuation of 12 Hounsfield units. There is a mass in the inferior pole the right kidney with a central attenuation of 13 Hounsfield units, consistent with a cyst with peripheral rim-like calcification. Other right renal cysts are noted. There appear to be a few punctate/small stones in both kidneys without hydronephrosis. Mild bilateral perinephric stranding is symmetric with no evidence of acute inflammation. The ureters and bladder are normal in appearance. Stomach/Bowel: The stomach and small bowel are normal. There is fat stranding surrounding the junction of the distal descending and proximal sigmoid colon. There are diverticula in this region. No extraluminal fluid collection. There is extraluminal gas as seen on series 2, image 49 likely due to a micro perforation. No other free air in the abdomen. Other scattered colonic  diverticuli with no other sites of diverticulitis. The remainder of the colon is unremarkable. The appendix is not seen but there is no secondary evidence of appendicitis.  Vascular/Lymphatic: Atherosclerotic changes are seen in the nonaneurysmal aorta without adenopathy. Reproductive: Uterus and bilateral adnexa are unremarkable. Other: No additional abnormalities. Musculoskeletal: No acute or significant osseous findings. IMPRESSION: 1. The abnormality at the junction of the distal descending colon and proximal sigmoid colon is most consistent with diverticulitis. A small amount of focal adjacent extraluminal air is consistent with a focal micro perforation. No abscess or pericolonic fluid collections. No other free air. Recommend follow-up to ensure complete resolution. 2. Both kidneys contain and mass which appears cystic centrally with peripheral eggshell like calcification. These are consistent with Bosniak 2 lesions requiring no follow-up. 3. Nonobstructive stones in both kidneys. 4. Atherosclerotic changes in the nonaneurysmal aorta. Electronically Signed   By: Dorise Bullion III M.D   On: 12/01/2017 22:04    Pending Labs Unresulted Labs (From admission, onward)    Start     Ordered   12/01/17 2128  Urine Culture  STAT,   STAT     12/01/17 2127   Signed and Held  HIV antibody (Routine Testing)  Once,   R    Question:  Specimen collection method  Answer:  IV Team=IV Team collect   Signed and Held   Signed and Held  CBC  (enoxaparin (LOVENOX)    CrCl >/= 30 ml/min)  Once,   R    Comments:  Baseline for enoxaparin therapy IF NOT ALREADY DRAWN.  Notify MD if PLT < 100 K.   Question:  Specimen collection method  Answer:  IV Team=IV Team collect   Signed and Held   Signed and Held  Creatinine, serum  (enoxaparin (LOVENOX)    CrCl >/= 30 ml/min)  Once,   R    Comments:  Baseline for enoxaparin therapy IF NOT ALREADY DRAWN.   Question:  Specimen collection method  Answer:  IV Team=IV Team collect    Signed and Held   Signed and Held  Creatinine, serum  (enoxaparin (LOVENOX)    CrCl >/= 30 ml/min)  Weekly,   R    Comments:  while on enoxaparin therapy   Question:  Specimen collection method  Answer:  IV Team=IV Team collect   Signed and Held   Signed and Held  Comprehensive metabolic panel  Tomorrow morning,   R    Question:  Specimen collection method  Answer:  IV Team=IV Team collect   Signed and Held   Signed and Held  CBC  Tomorrow morning,   R    Question:  Specimen collection method  Answer:  IV Team=IV Team collect   Signed and Held          Vitals/Pain Today's Vitals   12/01/17 2318 12/01/17 2324 12/01/17 2346 12/01/17 2349  BP: (!) 164/84 (!) 164/84  (!) 188/81  Pulse: (!) 108 (!) 107 (!) 110 (!) 110  Resp:    16  Temp:  (!) 102.3 F (39.1 C) (!) 102.9 F (39.4 C) (!) 102.7 F (39.3 C)  TempSrc:  Oral Oral Oral  SpO2: 100% 100%  100%  Weight:      Height:      PainSc:        Isolation Precautions No active isolations  Medications Medications  ciprofloxacin (CIPRO) IVPB 400 mg (400 mg Intravenous New Bag/Given 12/02/17 0001)  morphine 4 MG/ML injection 4 mg (2 mg Intravenous Given 12/01/17 2103)  ondansetron (ZOFRAN) injection 4 mg (4 mg Intravenous Given 12/01/17 2102)  cefTRIAXone (ROCEPHIN) 1 g in sodium chloride 0.9 % 100 mL IVPB (  0 g Intravenous Stopped 12/01/17 2242)  metroNIDAZOLE (FLAGYL) IVPB 500 mg (0 mg Intravenous Stopped 12/01/17 2346)  acetaminophen (TYLENOL) tablet 650 mg (650 mg Oral Given 12/01/17 2327)  ketorolac (TORADOL) 30 MG/ML injection 15 mg (15 mg Intravenous Given 12/01/17 2357)    Mobility walks

## 2017-12-03 LAB — CBC
HCT: 28.2 % — ABNORMAL LOW (ref 36.0–46.0)
Hemoglobin: 9.1 g/dL — ABNORMAL LOW (ref 12.0–15.0)
MCH: 29.9 pg (ref 26.0–34.0)
MCHC: 32.3 g/dL (ref 30.0–36.0)
MCV: 92.8 fL (ref 78.0–100.0)
Platelets: 230 10*3/uL (ref 150–400)
RBC: 3.04 MIL/uL — AB (ref 3.87–5.11)
RDW: 15.3 % (ref 11.5–15.5)
WBC: 14.7 10*3/uL — AB (ref 4.0–10.5)

## 2017-12-03 LAB — URINE CULTURE

## 2017-12-03 LAB — GLUCOSE, CAPILLARY
GLUCOSE-CAPILLARY: 140 mg/dL — AB (ref 70–99)
GLUCOSE-CAPILLARY: 200 mg/dL — AB (ref 70–99)
GLUCOSE-CAPILLARY: 185 mg/dL — AB (ref 70–99)
Glucose-Capillary: 217 mg/dL — ABNORMAL HIGH (ref 70–99)

## 2017-12-03 LAB — BASIC METABOLIC PANEL
ANION GAP: 13 (ref 5–15)
BUN: 35 mg/dL — AB (ref 6–20)
CO2: 27 mmol/L (ref 22–32)
Calcium: 8.5 mg/dL — ABNORMAL LOW (ref 8.9–10.3)
Chloride: 94 mmol/L — ABNORMAL LOW (ref 98–111)
Creatinine, Ser: 8.42 mg/dL — ABNORMAL HIGH (ref 0.44–1.00)
GFR, EST AFRICAN AMERICAN: 6 mL/min — AB (ref >60)
GFR, EST NON AFRICAN AMERICAN: 5 mL/min — AB (ref >60)
Glucose, Bld: 218 mg/dL — ABNORMAL HIGH (ref 70–99)
POTASSIUM: 4.9 mmol/L (ref 3.5–5.1)
SODIUM: 134 mmol/L — AB (ref 135–145)

## 2017-12-03 LAB — HIV ANTIBODY (ROUTINE TESTING W REFLEX): HIV Screen 4th Generation wRfx: NONREACTIVE

## 2017-12-03 MED ORDER — INSULIN ASPART 100 UNIT/ML ~~LOC~~ SOLN
0.0000 [IU] | Freq: Three times a day (TID) | SUBCUTANEOUS | Status: DC
  Administered 2017-12-03: 7 [IU] via SUBCUTANEOUS
  Administered 2017-12-04: 4 [IU] via SUBCUTANEOUS

## 2017-12-03 MED ORDER — INSULIN ASPART 100 UNIT/ML ~~LOC~~ SOLN: 0.0000 [IU] | Freq: Every day | SUBCUTANEOUS | Status: DC

## 2017-12-03 NOTE — Progress Notes (Signed)
PROGRESS NOTE  Paula Bass  JSE:[id number] DOB: 12/27/1970 DOA: 12/01/2017 PCP: Marena Chancy of her nephrologist and has no PCP yet  Brief Narrative: Paula Bass is a 47 y.o. female with a history of SLE on chronic prednisone, ESRD on HD TTS, HTN, T2DM who recently moved from [place] who presented to the ED with constant, worsening, moderate-severe LLQ pain associated with diarrhea without bleeding. She presented after a full HD treatment 10/5 due to persistence of symptoms, found to be febrile to 102.43F, tachycardic, with LLQ tenderness but no peritoneal signs. WBC 18.8k and CT abdomen/pelvis demonstrating likely sigmoid diverticulitis with microperforation. Cipro/flagyl started, surgery consulted, and patient admitted to hospitalist service. Leukocytosis has remained and it is felt that an additional day of IV antibiotics and monitoring are required.  Assessment & Plan: Active Problems:   Acute diverticulitis   ESRD (end stage renal disease) (HCC)   DM2 (diabetes mellitus, type 2) (HCC)   Lupus (HCC)   Benign essential HTN   UTI (urinary tract infection)  Acute diverticulitis with microperforation: 1st episode. Never had colonoscopy.  - Surgery following, appreciate their recommendations. No role for surgery at this time. Confirmed that an additional day of IV antibiotics are recommended. Pt still has leukocytosis.  - Advance to full liquids.  - Pain control - Continue cipro/flagyl IV - No blood cultures were sent at admission - Will need colonoscopy after resolution  SLE: On chronic prednisone.  - Continue prednisone at current dose, elevate to stress dosing if vitals become unstable.  - Will need to establish care with PCP +/- rheumatology soon, CM consulted and pt has scheduled follow up with [facility].  ESRD: [place] [facility] since [place] in [place] about 2 weeks ago, having moved to be with husband from [place].  - Dr. Jonnie Finner notified and will see in the event  she needs to remain inpatient tomorrow. If able to discharge, would get outpatient HD, otherwise will transfer to [facility] tomorrow.   T2DM:  - Continue decreased insulin (home dose is 70/30 40u qAM, 30u qPM). Will give SSI, augment dosing now that advancing diet somewhat. If CBGs trending up despite SSI, would start 20u qHS lantus tonight (little under 1/2 cumulative long acting daily dose [which is ~ 49u])  HTN:  - Holding norvasc, lopressor given normotension and need for HD tomorrow. Continue to monitor and restart meds as needed.   Hyperlipidemia:  - Continue statin   Pyuria: No pneumaturia reported or CV fistula seen.  - On cipro as above though urine culture negative. Was also given CTX x1.   DVT prophylaxis: Heparin Code Status: Full Family Communication: None at bedside this AM Disposition Plan: Anticipate DC home in next 1-2 days.   Consultants:   General surgery  Nephrology  Procedures:   None  Antimicrobials:  Ceftriaxone 10/5  Ciprofloxacin 10/5 >>   Flagyl 10/5 >>    Subjective: Pain better at rest, still present with movement/palpation. No fevers or vomiting, +flatus, no BM.  Objective: Vitals:   12/02/17 0606 12/02/17 1352 12/02/17 2218 12/03/17 0540  BP: 110/61 114/68 136/76 127/79  Pulse: 86 84 91 77  Resp: 18 (!) 24 18 18   Temp: 98.6 F (37 C) 98.7 F (37.1 C) 98.9 F (37.2 C) 98.7 F (37.1 C)  TempSrc: Oral Oral Oral Oral  SpO2: 97% 93% 96% 96%  Weight:      Height:        Intake/Output Summary (Last 24 hours) at 12/03/2017 1058 Last data  filed at 12/03/2017 1017 Gross per 24 hour  Intake 1002.97 ml  Output 300 ml  Net 702.97 ml   Filed Weights   12/01/17 1751  Weight: 68 kg   Gen: 47 y.o. female in no distress Pulm: Nonlabored breathing room air. Clear. CV: Regular rate and rhythm. No murmur, rub, or gallop. No JVD, no dependent edema. GI: Abdomen soft, LLQ tenderness without rebound or distention, with normoactive bowel sounds.    Ext: Warm, no deformities. LUA AVF +thrill, no erythema. Skin: No other rashes, lesions or ulcers on visualized skin.  Neuro: Alert and oriented. No focal neurological deficits. Psych: Judgement and insight appear fair. Mood euthymic & affect congruent. Behavior is appropriate.    Data Reviewed: I have personally reviewed following labs and imaging studies  CBC: Recent Labs  Lab 12/01/17 2034 12/02/17 0757 12/03/17 0358  WBC 18.8* 21.8* 14.7*  HGB 12.0 8.9* 9.1*  HCT 37.2 27.7* 28.2*  MCV 92.3 93.3 92.8  PLT 244 232 440   Basic Metabolic Panel: Recent Labs  Lab 12/01/17 2034 12/02/17 0757 12/03/17 0358  NA 143 137 134*  K 3.9 3.8 4.9  CL 97* 95* 94*  CO2 31 31 27   GLUCOSE 216* 108* 218*  BUN 18 24* 35*  CREATININE 5.12* 6.32* 8.42*  CALCIUM 9.0 7.9* 8.5*   GFR: Estimated Creatinine Clearance: 7.6 mL/min (A) (by C-G formula based on SCr of 8.42 mg/dL (H)). Liver Function Tests: Recent Labs  Lab 12/01/17 2034 12/02/17 0757  AST 37 29  ALT 18 17  ALKPHOS 67 52  BILITOT 0.5 0.6  PROT 7.6 6.0*  ALBUMIN 3.2* 2.4*   Recent Labs  Lab 12/01/17 2034  LIPASE 53*   No results for input(s): AMMONIA in the last 168 hours. Coagulation Profile: No results for input(s): INR, PROTIME in the last 168 hours. Cardiac Enzymes: No results for input(s): CKTOTAL, CKMB, CKMBINDEX, TROPONINI in the last 168 hours. BNP (last 3 results) No results for input(s): PROBNP in the last 8760 hours. HbA1C: No results for input(s): HGBA1C in the last 72 hours. CBG: Recent Labs  Lab 12/02/17 0103 12/02/17 0739 12/02/17 1128 12/02/17 1604 12/02/17 2202  GLUCAP 138* 99 121* 85 162*   Lipid Profile: No results for input(s): CHOL, HDL, LDLCALC, TRIG, CHOLHDL, LDLDIRECT in the last 72 hours. Thyroid Function Tests: No results for input(s): TSH, T4TOTAL, FREET4, T3FREE, THYROIDAB in the last 72 hours. Anemia Panel: No results for input(s): VITAMINB12, FOLATE, FERRITIN, TIBC, IRON,  RETICCTPCT in the last 72 hours. Urine analysis:    Component Value Date/Time   COLORURINE YELLOW 12/01/2017 2017   APPEARANCEUR HAZY (A) 12/01/2017 2017   LABSPEC 1.009 12/01/2017 2017   PHURINE 9.0 (H) 12/01/2017 2017   GLUCOSEU 50 (A) 12/01/2017 2017   HGBUR NEGATIVE 12/01/2017 2017   BILIRUBINUR NEGATIVE 12/01/2017 2017   KETONESUR NEGATIVE 12/01/2017 2017   PROTEINUR >=300 (A) 12/01/2017 2017   NITRITE NEGATIVE 12/01/2017 2017   LEUKOCYTESUR LARGE (A) 12/01/2017 2017   Recent Results (from the past 240 hour(s))  Urine Culture     Status: None (Preliminary result)   Collection Time: 12/01/17  5:47 PM  Result Value Ref Range Status   Specimen Description   Final    URINE, RANDOM Performed at [facility], [place] [address], [place], [place] [address]    Special Requests   Final    NONE Performed at [facility], [place] [address] [place], [place] [address]    Culture  Final    CULTURE REINCUBATED FOR BETTER GROWTH Performed at [facility], [place] [address], [place], [place] [address]    Report Status PENDING  Incomplete      Radiology Studies: Ct Abdomen Pelvis Wo Contrast  Result Date: 12/01/2017 CLINICAL DATA:  Left lower quadrant pain for a few days. EXAM: CT ABDOMEN AND PELVIS WITHOUT CONTRAST TECHNIQUE: Multidetector CT imaging of the abdomen and pelvis was performed following the standard protocol without IV contrast. COMPARISON:  None. FINDINGS: Lower chest: No acute abnormality. Hepatobiliary: No focal liver abnormality is seen. No gallstones, gallbladder wall thickening, or biliary dilatation. Pancreas: Unremarkable. No pancreatic ductal dilatation or surrounding inflammatory changes. Spleen: Normal in size without focal abnormality. Adrenals/Urinary Tract: Adrenal glands are normal. There is a 2.8 cm mass, exophytic off the lower left kidney with peripheral rim-like calcification and a central attenuation of 12 Hounsfield  units. There is a mass in the inferior pole the right kidney with a central attenuation of 13 Hounsfield units, consistent with a cyst with peripheral rim-like calcification. Other right renal cysts are noted. There appear to be a few punctate/small stones in both kidneys without hydronephrosis. Mild bilateral perinephric stranding is symmetric with no evidence of acute inflammation. The ureters and bladder are normal in appearance. Stomach/Bowel: The stomach and small bowel are normal. There is fat stranding surrounding the junction of the distal descending and proximal sigmoid colon. There are diverticula in this region. No extraluminal fluid collection. There is extraluminal gas as seen on series 2, image 49 likely due to a micro perforation. No other free air in the abdomen. Other scattered colonic diverticuli with no other sites of diverticulitis. The remainder of the colon is unremarkable. The appendix is not seen but there is no secondary evidence of appendicitis. Vascular/Lymphatic: Atherosclerotic changes are seen in the nonaneurysmal aorta without adenopathy. Reproductive: Uterus and bilateral adnexa are unremarkable. Other: No additional abnormalities. Musculoskeletal: No acute or significant osseous findings. IMPRESSION: 1. The abnormality at the junction of the distal descending colon and proximal sigmoid colon is most consistent with diverticulitis. A small amount of focal adjacent extraluminal air is consistent with a focal micro perforation. No abscess or pericolonic fluid collections. No other free air. Recommend follow-up to ensure complete resolution. 2. Both kidneys contain and mass which appears cystic centrally with peripheral eggshell like calcification. These are consistent with Bosniak 2 lesions requiring no follow-up. 3. Nonobstructive stones in both kidneys. 4. Atherosclerotic changes in the nonaneurysmal aorta. Electronically Signed   By: Dorise Bullion III M.D   On: 12/01/2017 22:04     Scheduled Meds: . atorvastatin  40 mg Oral q1800  . heparin injection (subcutaneous)  5,000 Units Subcutaneous Q8H  . insulin aspart  0-5 Units Subcutaneous QHS  . insulin aspart  0-9 Units Subcutaneous TID WC  . predniSONE  10 mg Oral Q breakfast  . sodium chloride flush  3 mL Intravenous Q12H   Continuous Infusions: . sodium chloride    . ciprofloxacin Stopped (12/02/17 2328)  . metronidazole 100 mL/hr at 12/03/17 0600     LOS: 2 days   Time spent: 25 minutes.  Patrecia Pour, MD [facility] [email or web address] Password [email or web address] 12/03/2017, 10:58 AM

## 2017-12-03 NOTE — Care Management Note (Signed)
Case Management Note  Patient Details  Name: Paula Bass MRN: [id number] Date of Birth: Mar 11, 1970  Subjective/Objective:    Spoke with patient and spouse at bedside. Discussed need for PCP. Spouse has established at [facility] and patient plans to go there as well. They have plans to f/u on Monday with the office.              Action/Plan: No needs identified. Has plans for getting PCP.   Expected Discharge Date:  12/06/17               Expected Discharge Plan:  Home/Self Care  In-House Referral:  PCP / [facility]  Discharge planning Services  CM Consult  Post Acute Care Choice:  NA Choice offered to:  Patient, Spouse  DME Arranged:  N/A DME Agency:  NA  HH Arranged:  NA HH Agency:  NA  Status of Service:  Completed, signed off  If discussed at [place] of Stay Meetings, dates discussed:    Additional Comments:  Guadalupe Maple, RN 12/03/2017, 9:32 AM

## 2017-12-03 NOTE — Progress Notes (Signed)
[facility] Progress Note     Subjective: CC:  Denies pain at rest, mild discomfort with palpation or movement. Tolerating PO without nausea or vomiting. +flatus. Denies BM.   Objective: Vital signs in last 24 hours: Temp:  [98.7 F (37.1 C)-98.9 F (37.2 C)] 98.7 F (37.1 C) (10/07 0540) Pulse Rate:  [77-91] 77 (10/07 0540) Resp:  [18-24] 18 (10/07 0540) BP: (114-136)/(68-79) 127/79 (10/07 0540) SpO2:  [93 %-96 %] 96 % (10/07 0540) Last BM Date: 12/01/17  Intake/Output from previous day: 10/06 0701 - 10/07 0700 In: 907.1 [P.O.:480; I.V.:3; IV Piggyback:424.1] Out: 300 [Urine:300] Intake/Output this shift: No intake/output data recorded.  PE: Gen:  Alert, NAD, pleasant  Card:  Regular rate and rhythm, No peripheral edema  Pulm:  Normal effort, clear to auscultation bilaterally Abd: Soft, obese, mild tenderness to deep palpation LLQ, +BS Skin: warm and dry, no rashes  Psych: A&Ox3   Lab Results:  Recent Labs    12/02/17 0757 12/03/17 0358  WBC 21.8* 14.7*  HGB 8.9* 9.1*  HCT 27.7* 28.2*  PLT 232 230   BMET Recent Labs    12/02/17 0757 12/03/17 0358  NA 137 134*  K 3.8 4.9  CL 95* 94*  CO2 31 27  GLUCOSE 108* 218*  BUN 24* 35*  CREATININE 6.32* 8.42*  CALCIUM 7.9* 8.5*   PT/INR No results for input(s): LABPROT, INR in the last 72 hours. CMP     Component Value Date/Time   NA 134 (L) 12/03/2017 0358   K 4.9 12/03/2017 0358   CL 94 (L) 12/03/2017 0358   CO2 27 12/03/2017 0358   GLUCOSE 218 (H) 12/03/2017 0358   BUN 35 (H) 12/03/2017 0358   CREATININE 8.42 (H) 12/03/2017 0358   CALCIUM 8.5 (L) 12/03/2017 0358   PROT 6.0 (L) 12/02/2017 0757   ALBUMIN 2.4 (L) 12/02/2017 0757   AST 29 12/02/2017 0757   ALT 17 12/02/2017 0757   ALKPHOS 52 12/02/2017 0757   BILITOT 0.6 12/02/2017 0757   GFRNONAA 5 (L) 12/03/2017 0358   GFRAA 6 (L) 12/03/2017 0358   Lipase     Component Value Date/Time   LIPASE 53 (H) 12/01/2017 2034        Studies/Results: Ct Abdomen Pelvis Wo Contrast  Result Date: 12/01/2017 CLINICAL DATA:  Left lower quadrant pain for a few days. EXAM: CT ABDOMEN AND PELVIS WITHOUT CONTRAST TECHNIQUE: Multidetector CT imaging of the abdomen and pelvis was performed following the standard protocol without IV contrast. COMPARISON:  None. FINDINGS: Lower chest: No acute abnormality. Hepatobiliary: No focal liver abnormality is seen. No gallstones, gallbladder wall thickening, or biliary dilatation. Pancreas: Unremarkable. No pancreatic ductal dilatation or surrounding inflammatory changes. Spleen: Normal in size without focal abnormality. Adrenals/Urinary Tract: Adrenal glands are normal. There is a 2.8 cm mass, exophytic off the lower left kidney with peripheral rim-like calcification and a central attenuation of 12 Hounsfield units. There is a mass in the inferior pole the right kidney with a central attenuation of 13 Hounsfield units, consistent with a cyst with peripheral rim-like calcification. Other right renal cysts are noted. There appear to be a few punctate/small stones in both kidneys without hydronephrosis. Mild bilateral perinephric stranding is symmetric with no evidence of acute inflammation. The ureters and bladder are normal in appearance. Stomach/Bowel: The stomach and small bowel are normal. There is fat stranding surrounding the junction of the distal descending and proximal sigmoid colon. There are diverticula in this region. No extraluminal fluid collection. There  is extraluminal gas as seen on series 2, image 49 likely due to a micro perforation. No other free air in the abdomen. Other scattered colonic diverticuli with no other sites of diverticulitis. The remainder of the colon is unremarkable. The appendix is not seen but there is no secondary evidence of appendicitis. Vascular/Lymphatic: Atherosclerotic changes are seen in the nonaneurysmal aorta without adenopathy. Reproductive: Uterus and  bilateral adnexa are unremarkable. Other: No additional abnormalities. Musculoskeletal: No acute or significant osseous findings. IMPRESSION: 1. The abnormality at the junction of the distal descending colon and proximal sigmoid colon is most consistent with diverticulitis. A small amount of focal adjacent extraluminal air is consistent with a focal micro perforation. No abscess or pericolonic fluid collections. No other free air. Recommend follow-up to ensure complete resolution. 2. Both kidneys contain and mass which appears cystic centrally with peripheral eggshell like calcification. These are consistent with Bosniak 2 lesions requiring no follow-up. 3. Nonobstructive stones in both kidneys. 4. Atherosclerotic changes in the nonaneurysmal aorta. Electronically Signed   By: Dorise Bullion III M.D   On: 12/01/2017 22:04    Anti-infectives: Anti-infectives (From admission, onward)   Start     Dose/Rate Route Frequency Ordered Stop   12/02/17 2200  ciprofloxacin (CIPRO) IVPB 200 mg     200 mg 100 mL/hr over 60 Minutes Intravenous Every 24 hours 12/02/17 1312     12/02/17 1800  ciprofloxacin (CIPRO) IVPB 200 mg  Status:  Discontinued     200 mg 100 mL/hr over 60 Minutes Intravenous Every 12 hours 12/02/17 0115 12/02/17 1312   12/02/17 0600  metroNIDAZOLE (FLAGYL) IVPB 500 mg     500 mg 100 mL/hr over 60 Minutes Intravenous Every 8 hours 12/02/17 0115     12/01/17 2215  ciprofloxacin (CIPRO) IVPB 400 mg     400 mg 200 mL/hr over 60 Minutes Intravenous  Once 12/01/17 2210 12/02/17 0101   12/01/17 2215  metroNIDAZOLE (FLAGYL) IVPB 500 mg     500 mg 100 mL/hr over 60 Minutes Intravenous  Once 12/01/17 2210 12/01/17 2346   12/01/17 2130  cefTRIAXone (ROCEPHIN) 1 g in sodium chloride 0.9 % 100 mL IVPB     1 g 200 mL/hr over 30 Minutes Intravenous  Once 12/01/17 2127 12/01/17 2242       Assessment/Plan Acute diverticulitis with microperforation  - afebrile, WBC 14.7 from 21 - continue IV  abx - tolerating clears, advance to full liquids today, SOFT in AM if tolerates fulls without worsening of sxs - mobilize/IS  - no acute surgical needs, improving on IV abx. Recommend GI referral for colonoscopy in 6-8 weeks.     LOS: 2 days    Obie Dredge, [facility] Pager: [phone]

## 2017-12-04 LAB — GLUCOSE, CAPILLARY
GLUCOSE-CAPILLARY: 175 mg/dL — AB (ref 70–99)
Glucose-Capillary: 100 mg/dL — ABNORMAL HIGH (ref 70–99)

## 2017-12-04 LAB — BASIC METABOLIC PANEL
ANION GAP: 14 (ref 5–15)
BUN: 50 mg/dL — ABNORMAL HIGH (ref 6–20)
CALCIUM: 8.5 mg/dL — AB (ref 8.9–10.3)
CHLORIDE: 98 mmol/L (ref 98–111)
CO2: 24 mmol/L (ref 22–32)
Creatinine, Ser: 10.04 mg/dL — ABNORMAL HIGH (ref 0.44–1.00)
GFR calc non Af Amer: 4 mL/min — ABNORMAL LOW (ref >60)
GFR, EST AFRICAN AMERICAN: 5 mL/min — AB (ref >60)
Glucose, Bld: 141 mg/dL — ABNORMAL HIGH (ref 70–99)
POTASSIUM: 4.2 mmol/L (ref 3.5–5.1)
Sodium: 136 mmol/L (ref 135–145)

## 2017-12-04 LAB — CBC
HEMATOCRIT: 28 % — AB (ref 36.0–46.0)
HEMOGLOBIN: 9 g/dL — AB (ref 12.0–15.0)
MCH: 29.4 pg (ref 26.0–34.0)
MCHC: 32.1 g/dL (ref 30.0–36.0)
MCV: 91.5 fL (ref 80.0–100.0)
Platelets: 277 10*3/uL (ref 150–400)
RBC: 3.06 MIL/uL — ABNORMAL LOW (ref 3.87–5.11)
RDW: 15 % (ref 11.5–15.5)
WBC: 11.4 10*3/uL — ABNORMAL HIGH (ref 4.0–10.5)

## 2017-12-04 MED ORDER — METRONIDAZOLE 500 MG PO TABS: 500.0000 mg | ORAL_TABLET | Freq: Three times a day (TID) | ORAL | 0 refills | Status: AC

## 2017-12-04 MED ORDER — CIPROFLOXACIN HCL 250 MG PO TABS: 250.0000 mg | ORAL_TABLET | Freq: Every day | ORAL | 0 refills | Status: AC

## 2017-12-04 NOTE — Care Management Important Message (Signed)
Important Message  Patient Details  Name: Paula Bass MRN: [id number] Date of Birth: 1970-12-05   Medicare Important Message Given:  Yes    Kerin Salen 12/04/2017, 11:48 AMImportant Message  Patient Details  Name: Paula Bass MRN: [id number] Date of Birth: 12-Sep-1970   Medicare Important Message Given:  Yes    Kerin Salen 12/04/2017, 11:48 AM

## 2017-12-04 NOTE — Discharge Summary (Signed)
Physician Discharge Summary  Paula Bass JME:[id number] DOB: 08-08-1970 DOA: 12/01/2017  PCP: Patient, No Pcp Per  Admit date: 12/01/2017 Discharge date: 12/04/2017  Admitted From: Home Disposition: Home   Recommendations for Outpatient Follow-up:  1. Follow up with PCP in 1-2 weeks at [facility] 2. Follow up with nephrology and routine HD TTS as scheduled 3. Needs GI follow up to discuss colonoscopy  Home Health: None Equipment/Devices: None Discharge Condition: Stable CODE STATUS: Full Diet recommendation: Low fiber  Brief/Interim Summary: Paula Bass is a 47 y.o. female with a history of SLE on chronic prednisone, ESRD on HD TTS, HTN, T2DM who recently moved from [place] who presented to the ED with constant, worsening, moderate-severe LLQ pain associated with diarrhea without bleeding. She presented after a full HD treatment 10/5 due to persistence of symptoms, found to be febrile to 102.14F, tachycardic, with LLQ tenderness but no peritoneal signs. WBC 18.8k and CT abdomen/pelvis demonstrating likely sigmoid diverticulitis with microperforation. Cipro/flagyl started, surgery consulted, and patient admitted to hospitalist service. Leukocytosis has improved, symptoms resolving and general surgery feels she is stable for discharge to outpatient hemodialysis on 10/8.   Discharge Diagnoses:  Active Problems:   Acute diverticulitis   ESRD (end stage renal disease) (HCC)   DM2 (diabetes mellitus, type 2) (HCC)   Lupus (HCC)   Benign essential HTN   UTI (urinary tract infection)  Acute diverticulitis with microperforation: 1st episode. Never had colonoscopy.  - Surgery following, appreciate their recommendations. No role for surgery at this time. Confirmed that an additional day of IV antibiotics are recommended. Pt still has leukocytosis.  - Advance to full liquids.  - Pain control - Continue cipro/flagyl IV > PO - No blood cultures were sent at admission - Will  need colonoscopy after resolution  SLE: On chronic prednisone.  - Continue prednisone at current dose, elevate to stress dosing if vitals become unstable.  - Will need to establish care with PCP +/- rheumatology soon, CM consulted and pt has scheduled follow up with [facility].  ESRD: T[place] on [facility] since arriving in [place] about 2 weeks ago, having moved to be with husband from [place].  - Follow up with outpatient HD as scheduled today.   T2DM:  - Restart home insulin. Required less insulin while admitted due to restricted diet.  HTN:  - Continue to monitor and restart meds as needed.   Hyperlipidemia:  - Continue statin   GBS colonization: No pneumaturia reported or CV fistula seen. Denies dysuria or other urinary symptoms.  - Will hold treatment for now given lack of symptoms. Discussed with patient that if symptoms develop, she will require targeted treatment.  Discharge Instructions Discharge Instructions    Diet - low sodium heart healthy   Complete by:  As directed    Discharge instructions   Complete by:  As directed    Follow up with your primary care doctor. You will need a referral to GI for colonoscopy in the next 1-2 months.   Take cipro once a day only. Take it AFTER dialysis on days you have dialysis. Also take flagyl three times daily. Both for the next 10 days.   If your symptoms worsen, seek medical attention right away.   Increase activity slowly   Complete by:  As directed      Allergies as of 12/04/2017      Reactions   Penicillins Hives      Medication List    TAKE these  medications   amLODipine 10 MG tablet Commonly known as:  NORVASC Take 10 mg by mouth daily.   atorvastatin 40 MG tablet Commonly known as:  LIPITOR Take 40 mg by mouth every evening.   ciprofloxacin 250 MG tablet Commonly known as:  CIPRO Take 1 tablet (250 mg total) by mouth daily for 10 days. AFTER dialysis on TTS   clopidogrel 75 MG tablet Commonly  known as:  PLAVIX Take 75 mg by mouth daily.   insulin aspart protamine- aspart (70-30) 100 UNIT/ML injection Commonly known as:  NOVOLOG MIX 70/30 Inject 30-40 Units into the skin See admin instructions. 40 units in the morning, 30 units at night   lidocaine-prilocaine cream Commonly known as:  EMLA Apply 1 application topically daily.   metoprolol tartrate 25 MG tablet Commonly known as:  LOPRESSOR Take 25 mg by mouth 2 (two) times daily.   metroNIDAZOLE 500 MG tablet Commonly known as:  FLAGYL Take 1 tablet (500 mg total) by mouth 3 (three) times daily for 10 days.   predniSONE 10 MG tablet Commonly known as:  DELTASONE Take 10 mg by mouth daily.      Follow-up Information    [facility]. Schedule an appointment as soon as possible for a visit.          Allergies  Allergen Reactions  . Penicillins Hives    Consultations:  General surgery  Procedures/Studies: Ct Abdomen Pelvis Wo Contrast  Result Date: 12/01/2017 CLINICAL DATA:  Left lower quadrant pain for a few days. EXAM: CT ABDOMEN AND PELVIS WITHOUT CONTRAST TECHNIQUE: Multidetector CT imaging of the abdomen and pelvis was performed following the standard protocol without IV contrast. COMPARISON:  None. FINDINGS: Lower chest: No acute abnormality. Hepatobiliary: No focal liver abnormality is seen. No gallstones, gallbladder wall thickening, or biliary dilatation. Pancreas: Unremarkable. No pancreatic ductal dilatation or surrounding inflammatory changes. Spleen: Normal in size without focal abnormality. Adrenals/Urinary Tract: Adrenal glands are normal. There is a 2.8 cm mass, exophytic off the lower left kidney with peripheral rim-like calcification and a central attenuation of 12 Hounsfield units. There is a mass in the inferior pole the right kidney with a central attenuation of 13 Hounsfield units, consistent with a cyst with peripheral rim-like calcification. Other right renal cysts are noted. There  appear to be a few punctate/small stones in both kidneys without hydronephrosis. Mild bilateral perinephric stranding is symmetric with no evidence of acute inflammation. The ureters and bladder are normal in appearance. Stomach/Bowel: The stomach and small bowel are normal. There is fat stranding surrounding the junction of the distal descending and proximal sigmoid colon. There are diverticula in this region. No extraluminal fluid collection. There is extraluminal gas as seen on series 2, image 49 likely due to a micro perforation. No other free air in the abdomen. Other scattered colonic diverticuli with no other sites of diverticulitis. The remainder of the colon is unremarkable. The appendix is not seen but there is no secondary evidence of appendicitis. Vascular/Lymphatic: Atherosclerotic changes are seen in the nonaneurysmal aorta without adenopathy. Reproductive: Uterus and bilateral adnexa are unremarkable. Other: No additional abnormalities. Musculoskeletal: No acute or significant osseous findings. IMPRESSION: 1. The abnormality at the junction of the distal descending colon and proximal sigmoid colon is most consistent with diverticulitis. A small amount of focal adjacent extraluminal air is consistent with a focal micro perforation. No abscess or pericolonic fluid collections. No other free air. Recommend follow-up to ensure complete resolution. 2. Both kidneys contain and mass  which appears cystic centrally with peripheral eggshell like calcification. These are consistent with Bosniak 2 lesions requiring no follow-up. 3. Nonobstructive stones in both kidneys. 4. Atherosclerotic changes in the nonaneurysmal aorta. Electronically Signed   By: Dorise Bullion III M.D   On: 12/01/2017 22:04    Subjective: Abdominal pain improved, not having to take any medications for it. No N/V/D/bleeding. Wants to go.   Discharge Exam: Vitals:   12/03/17 2034 12/04/17 0538  BP: 137/77 131/76  Pulse: 76 75   Resp: 18 18  Temp: 98.8 F (37.1 C) 98.6 F (37 C)  SpO2: 98% 100%   General: Pt is alert, awake, not in acute distress Cardiovascular: RRR, S1/S2 +, no rubs, no gallops Respiratory: CTA bilaterally, no wheezing, no rhonchi Abdominal: Soft, some LLQ tenderness without rebound or guarding, ND, bowel sounds + Extremities: No edema, no cyanosis  Labs: BNP (last 3 results) No results for input(s): BNP in the last 8760 hours. Basic Metabolic Panel: Recent Labs  Lab 12/01/17 2034 12/02/17 0757 12/03/17 0358 12/04/17 0442  NA 143 137 134* 136  K 3.9 3.8 4.9 4.2  CL 97* 95* 94* 98  CO2 31 31 27 24   GLUCOSE 216* 108* 218* 141*  BUN 18 24* 35* 50*  CREATININE 5.12* 6.32* 8.42* 10.04*  CALCIUM 9.0 7.9* 8.5* 8.5*   Liver Function Tests: Recent Labs  Lab 12/01/17 2034 12/02/17 0757  AST 37 29  ALT 18 17  ALKPHOS 67 52  BILITOT 0.5 0.6  PROT 7.6 6.0*  ALBUMIN 3.2* 2.4*   Recent Labs  Lab 12/01/17 2034  LIPASE 53*   No results for input(s): AMMONIA in the last 168 hours. CBC: Recent Labs  Lab 12/01/17 2034 12/02/17 0757 12/03/17 0358 12/04/17 0442  WBC 18.8* 21.8* 14.7* 11.4*  HGB 12.0 8.9* 9.1* 9.0*  HCT 37.2 27.7* 28.2* 28.0*  MCV 92.3 93.3 92.8 91.5  PLT 244 232 230 277   Cardiac Enzymes: No results for input(s): CKTOTAL, CKMB, CKMBINDEX, TROPONINI in the last 168 hours. BNP: Invalid input(s): POCBNP CBG: Recent Labs  Lab 12/03/17 0736 12/03/17 1137 12/03/17 1716 12/03/17 2133 12/04/17 0804  GLUCAP 140* 200* 217* 185* 100*   D-Dimer No results for input(s): DDIMER in the last 72 hours. Hgb A1c No results for input(s): HGBA1C in the last 72 hours. Lipid Profile No results for input(s): CHOL, HDL, LDLCALC, TRIG, CHOLHDL, LDLDIRECT in the last 72 hours. Thyroid function studies No results for input(s): TSH, T4TOTAL, T3FREE, THYROIDAB in the last 72 hours.  Invalid input(s): FREET3 Anemia work up No results for input(s): VITAMINB12, FOLATE,  FERRITIN, TIBC, IRON, RETICCTPCT in the last 72 hours. Urinalysis    Component Value Date/Time   COLORURINE YELLOW 12/01/2017 2017   APPEARANCEUR HAZY (A) 12/01/2017 2017   LABSPEC 1.009 12/01/2017 2017   PHURINE 9.0 (H) 12/01/2017 2017   GLUCOSEU 50 (A) 12/01/2017 2017   HGBUR NEGATIVE 12/01/2017 2017   BILIRUBINUR NEGATIVE 12/01/2017 2017   KETONESUR NEGATIVE 12/01/2017 2017   PROTEINUR >=300 (A) 12/01/2017 2017   NITRITE NEGATIVE 12/01/2017 2017   LEUKOCYTESUR LARGE (A) 12/01/2017 2017    Microbiology Recent Results (from the past 240 hour(s))  Urine Culture     Status: Abnormal   Collection Time: 12/01/17  5:47 PM  Result Value Ref Range Status   Specimen Description   Final    URINE, RANDOM Performed at [facility], [place] [address] [place], [place] [address]    Special Requests   Final  NONE Performed at [facility], [place] [address], [place], [place] [address]    Culture (A)  Final    >=100,000 COLONIES/mL GROUP B STREP(S.AGALACTIAE)ISOLATED TESTING AGAINST S. AGALACTIAE NOT ROUTINELY PERFORMED DUE TO PREDICTABILITY OF AMP/PEN/VAN SUSCEPTIBILITY. Performed at [facility], [place] [address] [place], [place] [address]    Report Status 12/03/2017 FINAL  Final    Time coordinating discharge: Approximately 40 minutes  Patrecia Pour, MD  [facility] 12/04/2017, 11:30 AM Pager [phone]

## 2017-12-04 NOTE — Progress Notes (Signed)
[facility] Progress Note     Subjective: CC:  Abdominal pain improving. Denies nausea or vomiting. Has not had a BM. Has outpatient HD today at noon.  Objective: Vital signs in last 24 hours: Temp:  [98.3 F (36.8 C)-98.8 F (37.1 C)] 98.6 F (37 C) (10/08 0538) Pulse Rate:  [75-80] 75 (10/08 0538) Resp:  [17-18] 18 (10/08 0538) BP: (126-137)/(75-77) 131/76 (10/08 0538) SpO2:  [94 %-100 %] 100 % (10/08 0538) Last BM Date: 12/01/17  Intake/Output from previous day: 10/07 0701 - 10/08 0700 In: 1458.1 [P.O.:1080; IV Piggyback:378.1] Out: 1400 [Urine:1300; Stool:100] Intake/Output this shift: No intake/output data recorded.  PE: Gen:  Alert, NAD, pleasant Card:  Regular rate and rhythm, pedal pulses 2+ BL Pulm:  Normal effort, clear to auscultation bilaterally Abd: Soft, mostly subjective TTP LLQ, no peritonitis, BS Skin: warm and dry, no rashes  Psych: A&Ox3   Lab Results:  Recent Labs    12/03/17 0358 12/04/17 0442  WBC 14.7* 11.4*  HGB 9.1* 9.0*  HCT 28.2* 28.0*  PLT 230 277   BMET Recent Labs    12/03/17 0358 12/04/17 0442  NA 134* 136  K 4.9 4.2  CL 94* 98  CO2 27 24  GLUCOSE 218* 141*  BUN 35* 50*  CREATININE 8.42* 10.04*  CALCIUM 8.5* 8.5*   PT/INR No results for input(s): LABPROT, INR in the last 72 hours. CMP     Component Value Date/Time   NA 136 12/04/2017 0442   K 4.2 12/04/2017 0442   CL 98 12/04/2017 0442   CO2 24 12/04/2017 0442   GLUCOSE 141 (H) 12/04/2017 0442   BUN 50 (H) 12/04/2017 0442   CREATININE 10.04 (H) 12/04/2017 0442   CALCIUM 8.5 (L) 12/04/2017 0442   PROT 6.0 (L) 12/02/2017 0757   ALBUMIN 2.4 (L) 12/02/2017 0757   AST 29 12/02/2017 0757   ALT 17 12/02/2017 0757   ALKPHOS 52 12/02/2017 0757   BILITOT 0.6 12/02/2017 0757   GFRNONAA 4 (L) 12/04/2017 0442   GFRAA 5 (L) 12/04/2017 0442   Lipase     Component Value Date/Time   LIPASE 53 (H) 12/01/2017 2034       Studies/Results: No results  found.  Anti-infectives: Anti-infectives (From admission, onward)   Start     Dose/Rate Route Frequency Ordered Stop   12/02/17 2200  ciprofloxacin (CIPRO) IVPB 200 mg     200 mg 100 mL/hr over 60 Minutes Intravenous Every 24 hours 12/02/17 1312     12/02/17 1800  ciprofloxacin (CIPRO) IVPB 200 mg  Status:  Discontinued     200 mg 100 mL/hr over 60 Minutes Intravenous Every 12 hours 12/02/17 0115 12/02/17 1312   12/02/17 0600  metroNIDAZOLE (FLAGYL) IVPB 500 mg     500 mg 100 mL/hr over 60 Minutes Intravenous Every 8 hours 12/02/17 0115     12/01/17 2215  ciprofloxacin (CIPRO) IVPB 400 mg     400 mg 200 mL/hr over 60 Minutes Intravenous  Once 12/01/17 2210 12/02/17 0101   12/01/17 2215  metroNIDAZOLE (FLAGYL) IVPB 500 mg     500 mg 100 mL/hr over 60 Minutes Intravenous  Once 12/01/17 2210 12/01/17 2346   12/01/17 2130  cefTRIAXone (ROCEPHIN) 1 g in sodium chloride 0.9 % 100 mL IVPB     1 g 200 mL/hr over 30 Minutes Intravenous  Once 12/01/17 2127 12/01/17 2242     Assessment/Plan Acute diverticulitis with microperforation  - afebrile, WBC 11 from 14  - transition  to PO abx - tolerating  full liquids today, advance to SOFT- mobilize/IS  - no acute surgical needs, improving with medical management   Plan- stable for DC on PO abx from surgical perspective. Recommend GI referral for colonoscopy in 6-8 weeks.     LOS: 3 days    Obie Dredge, [facility] Surgery Pager: [phone]

## 2017-12-04 NOTE — Progress Notes (Signed)
Discharge instructions given to pt and all questions were answered.  

## 2017-12-15 ENCOUNTER — Emergency Department (HOSPITAL_COMMUNITY): Payer: Medicare (Managed Care)

## 2017-12-15 ENCOUNTER — Inpatient Hospital Stay (HOSPITAL_COMMUNITY)
Admission: EM | Admit: 2017-12-15 | Discharge: 2017-12-19 | DRG: 377 | Disposition: A | Discharge status: Home / Self Care | Payer: Medicare (Managed Care) | Attending: Internal Medicine | Admitting: Internal Medicine

## 2017-12-15 ENCOUNTER — Encounter (HOSPITAL_COMMUNITY): Payer: Self-pay | Admitting: Emergency Medicine

## 2017-12-15 ENCOUNTER — Other Ambulatory Visit: Payer: Self-pay

## 2017-12-15 DIAGNOSIS — K635 Polyp of colon: Secondary | ICD-10-CM | POA: Diagnosis present

## 2017-12-15 DIAGNOSIS — E119 Type 2 diabetes mellitus without complications: Secondary | ICD-10-CM

## 2017-12-15 DIAGNOSIS — D62 Acute posthemorrhagic anemia: Secondary | ICD-10-CM | POA: Diagnosis present

## 2017-12-15 DIAGNOSIS — K5731 Diverticulosis of large intestine without perforation or abscess with bleeding: Principal | ICD-10-CM | POA: Diagnosis present

## 2017-12-15 DIAGNOSIS — K922 Gastrointestinal hemorrhage, unspecified: Secondary | ICD-10-CM | POA: Insufficient documentation

## 2017-12-15 DIAGNOSIS — I120 Hypertensive chronic kidney disease with stage 5 chronic kidney disease or end stage renal disease: Secondary | ICD-10-CM | POA: Diagnosis present

## 2017-12-15 DIAGNOSIS — I10 Essential (primary) hypertension: Secondary | ICD-10-CM | POA: Diagnosis present

## 2017-12-15 DIAGNOSIS — Z79899 Other long term (current) drug therapy: Secondary | ICD-10-CM

## 2017-12-15 DIAGNOSIS — K641 Second degree hemorrhoids: Secondary | ICD-10-CM | POA: Diagnosis present

## 2017-12-15 DIAGNOSIS — K5733 Diverticulitis of large intestine without perforation or abscess with bleeding: Secondary | ICD-10-CM

## 2017-12-15 DIAGNOSIS — E785 Hyperlipidemia, unspecified: Secondary | ICD-10-CM | POA: Diagnosis present

## 2017-12-15 DIAGNOSIS — K621 Rectal polyp: Secondary | ICD-10-CM | POA: Diagnosis present

## 2017-12-15 DIAGNOSIS — K644 Residual hemorrhoidal skin tags: Secondary | ICD-10-CM | POA: Diagnosis present

## 2017-12-15 DIAGNOSIS — E1122 Type 2 diabetes mellitus with diabetic chronic kidney disease: Secondary | ICD-10-CM | POA: Diagnosis present

## 2017-12-15 DIAGNOSIS — Z992 Dependence on renal dialysis: Secondary | ICD-10-CM

## 2017-12-15 DIAGNOSIS — K5732 Diverticulitis of large intestine without perforation or abscess without bleeding: Secondary | ICD-10-CM

## 2017-12-15 DIAGNOSIS — N186 End stage renal disease: Secondary | ICD-10-CM | POA: Diagnosis present

## 2017-12-15 DIAGNOSIS — Z794 Long term (current) use of insulin: Secondary | ICD-10-CM

## 2017-12-15 DIAGNOSIS — Z7952 Long term (current) use of systemic steroids: Secondary | ICD-10-CM

## 2017-12-15 DIAGNOSIS — K625 Hemorrhage of anus and rectum: Secondary | ICD-10-CM

## 2017-12-15 DIAGNOSIS — I959 Hypotension, unspecified: Secondary | ICD-10-CM | POA: Diagnosis present

## 2017-12-15 DIAGNOSIS — D500 Iron deficiency anemia secondary to blood loss (chronic): Secondary | ICD-10-CM

## 2017-12-15 DIAGNOSIS — I2510 Atherosclerotic heart disease of native coronary artery without angina pectoris: Secondary | ICD-10-CM | POA: Diagnosis present

## 2017-12-15 DIAGNOSIS — D631 Anemia in chronic kidney disease: Secondary | ICD-10-CM | POA: Diagnosis present

## 2017-12-15 DIAGNOSIS — K921 Melena: Secondary | ICD-10-CM | POA: Diagnosis present

## 2017-12-15 DIAGNOSIS — M329 Systemic lupus erythematosus, unspecified: Secondary | ICD-10-CM | POA: Diagnosis present

## 2017-12-15 DIAGNOSIS — Z7902 Long term (current) use of antithrombotics/antiplatelets: Secondary | ICD-10-CM

## 2017-12-15 DIAGNOSIS — IMO0002 Reserved for concepts with insufficient information to code with codable children: Secondary | ICD-10-CM | POA: Diagnosis present

## 2017-12-15 DIAGNOSIS — Z880 Allergy status to penicillin: Secondary | ICD-10-CM

## 2017-12-15 HISTORY — DX: Diverticulitis of intestine, part unspecified, without perforation or abscess without bleeding: K57.92

## 2017-12-15 HISTORY — DX: Atherosclerotic heart disease of native coronary artery without angina pectoris: I25.10

## 2017-12-15 HISTORY — DX: Gastrointestinal hemorrhage, unspecified: K92.2

## 2017-12-15 LAB — HEMOGLOBIN A1C
Hgb A1c MFr Bld: 7 % — ABNORMAL HIGH (ref 4.8–5.6)
MEAN PLASMA GLUCOSE: 154.2 mg/dL

## 2017-12-15 LAB — I-STAT CHEM 8, ED
BUN: 29 mg/dL — ABNORMAL HIGH (ref 6–20)
Calcium, Ion: 1.07 mmol/L — ABNORMAL LOW (ref 1.15–1.40)
Chloride: 98 mmol/L (ref 98–111)
Creatinine, Ser: 6.1 mg/dL — ABNORMAL HIGH (ref 0.44–1.00)
GLUCOSE: 168 mg/dL — AB (ref 70–99)
HCT: 23 % — ABNORMAL LOW (ref 36.0–46.0)
HEMOGLOBIN: 7.8 g/dL — AB (ref 12.0–15.0)
POTASSIUM: 3.3 mmol/L — AB (ref 3.5–5.1)
Sodium: 139 mmol/L (ref 135–145)
TCO2: 30 mmol/L (ref 22–32)

## 2017-12-15 LAB — CBC
HCT: 23.9 % — ABNORMAL LOW (ref 36.0–46.0)
HEMATOCRIT: 23.2 % — AB (ref 36.0–46.0)
HEMATOCRIT: 24.8 % — AB (ref 36.0–46.0)
HEMOGLOBIN: 7.6 g/dL — AB (ref 12.0–15.0)
HEMOGLOBIN: 8 g/dL — AB (ref 12.0–15.0)
Hemoglobin: 7.5 g/dL — ABNORMAL LOW (ref 12.0–15.0)
MCH: 29.2 pg (ref 26.0–34.0)
MCH: 29.9 pg (ref 26.0–34.0)
MCH: 29.3 pg (ref 26.0–34.0)
MCHC: 31.4 g/dL (ref 30.0–36.0)
MCHC: 32.8 g/dL (ref 30.0–36.0)
MCHC: 32.3 g/dL (ref 30.0–36.0)
MCV: 93 fL (ref 80.0–100.0)
MCV: 91.3 fL (ref 80.0–100.0)
MCV: 90.8 fL (ref 80.0–100.0)
NRBC: 0 % (ref 0.0–0.2)
PLATELETS: 252 10*3/uL (ref 150–400)
Platelets: 177 10*3/uL (ref 150–400)
Platelets: 176 10*3/uL (ref 150–400)
RBC: 2.57 MIL/uL — AB (ref 3.87–5.11)
RBC: 2.54 MIL/uL — AB (ref 3.87–5.11)
RBC: 2.73 MIL/uL — ABNORMAL LOW (ref 3.87–5.11)
RDW: 16 % — ABNORMAL HIGH (ref 11.5–15.5)
RDW: 15.3 % (ref 11.5–15.5)
RDW: 15.4 % (ref 11.5–15.5)
WBC: 15.9 10*3/uL — AB (ref 4.0–10.5)
WBC: 15.5 10*3/uL — ABNORMAL HIGH (ref 4.0–10.5)
WBC: 14.7 10*3/uL — AB (ref 4.0–10.5)
nRBC: 0 % (ref 0.0–0.2)
nRBC: 0 % (ref 0.0–0.2)

## 2017-12-15 LAB — COMPREHENSIVE METABOLIC PANEL
ALK PHOS: 70 U/L (ref 38–126)
ALT: 18 U/L (ref 0–44)
ANION GAP: 9 (ref 5–15)
AST: 27 U/L (ref 15–41)
Albumin: 2.6 g/dL — ABNORMAL LOW (ref 3.5–5.0)
BUN: 28 mg/dL — ABNORMAL HIGH (ref 6–20)
CALCIUM: 8.2 mg/dL — AB (ref 8.9–10.3)
CO2: 28 mmol/L (ref 22–32)
Chloride: 101 mmol/L (ref 98–111)
Creatinine, Ser: 5.84 mg/dL — ABNORMAL HIGH (ref 0.44–1.00)
GFR calc non Af Amer: 8 mL/min — ABNORMAL LOW (ref >60)
GFR, EST AFRICAN AMERICAN: 9 mL/min — AB (ref >60)
Glucose, Bld: 176 mg/dL — ABNORMAL HIGH (ref 70–99)
Potassium: 3.2 mmol/L — ABNORMAL LOW (ref 3.5–5.1)
Sodium: 138 mmol/L (ref 135–145)
Total Bilirubin: 0.5 mg/dL (ref 0.3–1.2)
Total Protein: 5.7 g/dL — ABNORMAL LOW (ref 6.5–8.1)

## 2017-12-15 LAB — I-STAT BETA HCG BLOOD, ED (MC, WL, AP ONLY): I-stat hCG, quantitative: <5 m[IU]/mL (ref <5)

## 2017-12-15 LAB — PROTIME-INR
INR: 1.2
Prothrombin Time: 15.1 seconds (ref 11.4–15.2)

## 2017-12-15 LAB — APTT: APTT: 30 s (ref 24–36)

## 2017-12-15 LAB — GLUCOSE, CAPILLARY
GLUCOSE-CAPILLARY: 107 mg/dL — AB (ref 70–99)
GLUCOSE-CAPILLARY: 152 mg/dL — AB (ref 70–99)

## 2017-12-15 LAB — I-STAT CG4 LACTIC ACID, ED: Lactic Acid, Venous: 1.95 mmol/L — ABNORMAL HIGH (ref 0.5–1.9)

## 2017-12-15 LAB — ABO/RH: ABO/RH(D): O POS

## 2017-12-15 LAB — MRSA PCR SCREENING: MRSA by PCR: NEGATIVE

## 2017-12-15 LAB — PREPARE RBC (CROSSMATCH)

## 2017-12-15 LAB — LACTIC ACID, PLASMA: LACTIC ACID, VENOUS: 1.9 mmol/L (ref 0.5–1.9)

## 2017-12-15 MED ORDER — SORBITOL 70 % SOLN
30.0000 mL | Status: DC | PRN
  Filled 2017-12-15: qty 30

## 2017-12-15 MED ORDER — ONDANSETRON HCL 4 MG/2ML IJ SOLN
INTRAMUSCULAR | Status: AC
  Administered 2017-12-15: 4 mg
  Filled 2017-12-15: qty 2

## 2017-12-15 MED ORDER — METRONIDAZOLE IN NACL 5-0.79 MG/ML-% IV SOLN
500.0000 mg | Freq: Three times a day (TID) | INTRAVENOUS | Status: DC
  Administered 2017-12-15 – 2017-12-19 (×11): 500 mg via INTRAVENOUS
  Filled 2017-12-15 (×12): qty 100

## 2017-12-15 MED ORDER — ACETAMINOPHEN 325 MG PO TABS
650.0000 mg | ORAL_TABLET | Freq: Four times a day (QID) | ORAL | Status: DC | PRN
  Administered 2017-12-18: 650 mg via ORAL
  Filled 2017-12-15: qty 2

## 2017-12-15 MED ORDER — ACETAMINOPHEN 650 MG RE SUPP: 650.0000 mg | Freq: Four times a day (QID) | RECTAL | Status: DC | PRN

## 2017-12-15 MED ORDER — SODIUM CHLORIDE 0.9% IV SOLUTION
Freq: Once | INTRAVENOUS | Status: AC
  Administered 2017-12-15: 12:00:00 via INTRAVENOUS

## 2017-12-15 MED ORDER — INSULIN ASPART 100 UNIT/ML ~~LOC~~ SOLN
0.0000 [IU] | Freq: Three times a day (TID) | SUBCUTANEOUS | Status: DC
  Administered 2017-12-16: 1 [IU] via SUBCUTANEOUS
  Administered 2017-12-16: 5 [IU] via SUBCUTANEOUS
  Administered 2017-12-17: 3 [IU] via SUBCUTANEOUS
  Administered 2017-12-17: 1 [IU] via SUBCUTANEOUS
  Administered 2017-12-18 – 2017-12-19 (×2): 3 [IU] via SUBCUTANEOUS

## 2017-12-15 MED ORDER — DOCUSATE SODIUM 283 MG RE ENEM
1.0000 | ENEMA | RECTAL | Status: DC | PRN
  Filled 2017-12-15: qty 1

## 2017-12-15 MED ORDER — SODIUM CHLORIDE 0.9% FLUSH
3.0000 mL | Freq: Two times a day (BID) | INTRAVENOUS | Status: DC
  Administered 2017-12-15 – 2017-12-19 (×7): 3 mL via INTRAVENOUS

## 2017-12-15 MED ORDER — INSULIN GLARGINE 100 UNIT/ML ~~LOC~~ SOLN
15.0000 [IU] | Freq: Two times a day (BID) | SUBCUTANEOUS | Status: DC
  Administered 2017-12-15: 15 [IU] via SUBCUTANEOUS
  Filled 2017-12-15 (×3): qty 0.15

## 2017-12-15 MED ORDER — ATORVASTATIN CALCIUM 20 MG PO TABS
40.0000 mg | ORAL_TABLET | Freq: Every evening | ORAL | Status: DC
  Administered 2017-12-15 – 2017-12-18 (×4): 40 mg via ORAL
  Filled 2017-12-15 (×4): qty 2

## 2017-12-15 MED ORDER — CAMPHOR-MENTHOL 0.5-0.5 % EX LOTN: 1.0000 "application " | TOPICAL_LOTION | Freq: Three times a day (TID) | CUTANEOUS | Status: DC | PRN

## 2017-12-15 MED ORDER — HYDROXYZINE HCL 25 MG PO TABS: 25.0000 mg | ORAL_TABLET | Freq: Three times a day (TID) | ORAL | Status: DC | PRN

## 2017-12-15 MED ORDER — ZOLPIDEM TARTRATE 5 MG PO TABS: 5.0000 mg | ORAL_TABLET | Freq: Every evening | ORAL | Status: DC | PRN

## 2017-12-15 MED ORDER — HYDROCORTISONE NA SUCCINATE PF 100 MG IJ SOLR
50.0000 mg | Freq: Three times a day (TID) | INTRAMUSCULAR | Status: DC
  Administered 2017-12-15 – 2017-12-16 (×3): 50 mg via INTRAVENOUS
  Filled 2017-12-15 (×3): qty 2

## 2017-12-15 MED ORDER — PANTOPRAZOLE SODIUM 40 MG PO TBEC
40.0000 mg | DELAYED_RELEASE_TABLET | Freq: Every day | ORAL | Status: DC
  Filled 2017-12-15: qty 1

## 2017-12-15 MED ORDER — ONDANSETRON HCL 4 MG PO TABS: 4.0000 mg | ORAL_TABLET | Freq: Four times a day (QID) | ORAL | Status: DC | PRN

## 2017-12-15 MED ORDER — NEPRO/CARBSTEADY PO LIQD
237.0000 mL | Freq: Three times a day (TID) | ORAL | Status: DC | PRN
  Filled 2017-12-15: qty 237

## 2017-12-15 MED ORDER — INSULIN ASPART 100 UNIT/ML ~~LOC~~ SOLN
0.0000 [IU] | Freq: Every day | SUBCUTANEOUS | Status: DC
  Administered 2017-12-16: 2 [IU] via SUBCUTANEOUS

## 2017-12-15 MED ORDER — CALCIUM CARBONATE ANTACID 1250 MG/5ML PO SUSP
500.0000 mg | Freq: Four times a day (QID) | ORAL | Status: DC | PRN
  Filled 2017-12-15: qty 5

## 2017-12-15 MED ORDER — METOPROLOL TARTRATE 25 MG PO TABS
25.0000 mg | ORAL_TABLET | Freq: Two times a day (BID) | ORAL | Status: DC
  Administered 2017-12-15 – 2017-12-19 (×7): 25 mg via ORAL
  Filled 2017-12-15 (×8): qty 1

## 2017-12-15 MED ORDER — LACTATED RINGERS IV SOLN
INTRAVENOUS | Status: AC
  Administered 2017-12-15: 18:00:00 via INTRAVENOUS

## 2017-12-15 MED ORDER — ONDANSETRON HCL 4 MG/2ML IJ SOLN: 4.0000 mg | Freq: Four times a day (QID) | INTRAMUSCULAR | Status: DC | PRN

## 2017-12-15 MED ORDER — AMLODIPINE BESYLATE 5 MG PO TABS
10.0000 mg | ORAL_TABLET | Freq: Every day | ORAL | Status: DC
  Filled 2017-12-15: qty 2

## 2017-12-15 MED ORDER — IOPAMIDOL (ISOVUE-370) INJECTION 76%
80.0000 mL | Freq: Once | INTRAVENOUS | Status: AC | PRN
  Administered 2017-12-15: 80 mL via INTRAVENOUS

## 2017-12-15 MED ORDER — CIPROFLOXACIN IN D5W 400 MG/200ML IV SOLN
400.0000 mg | INTRAVENOUS | Status: DC
  Administered 2017-12-15 – 2017-12-18 (×4): 400 mg via INTRAVENOUS
  Filled 2017-12-15 (×3): qty 200

## 2017-12-15 MED ORDER — SODIUM CHLORIDE 0.9% IV SOLUTION: Freq: Once | INTRAVENOUS | Status: DC

## 2017-12-15 MED ORDER — PANTOPRAZOLE SODIUM 40 MG IV SOLR
40.0000 mg | Freq: Two times a day (BID) | INTRAVENOUS | Status: DC
  Administered 2017-12-15: 40 mg via INTRAVENOUS
  Filled 2017-12-15 (×3): qty 40

## 2017-12-15 NOTE — Consult Note (Addendum)
Consultation  Referring Provider:  Dr. Ellender Hose    Primary Care Physician:  Patient, No Pcp Per Primary Gastroenterologist:   Althia Forts      Reason for Consultation:  Hematochezia, ABLA            HPI:   Paula Bass is a 47 y.o. female with a past medical history significant for end-stage renal disease on hemodialysis on Tuesdays, Thursdays and Saturdays, diabetes, SLE on chronic prednisone and others listed below including chronic anticoagulation with Plavix status post stenting in 2017, presented to ER from dialysis after large volume hematochezia.    Recent admission 12/01/2017-12/04/2017 for acute diverticulitis microperforation.  Treated with antibiotics.    Today, patient explains that yesterday she had a bowel movement with some bright red blood mixed in, she proceeded to hemodialysis today and passed a large amount of bright redblood/stool.  She was sent to the ER.  Tells me she took her last dose of antibiotics for diverticulitis this morning, has had no further abdominal pain but tells me she did continue with looser than normal stools.  Denies previous episodes of same.  Last dose of Plavix was yesterday 12/14/2017.  Patient last ate yesterday.    Denies fever, chills, weight loss, reflux, nausea vomiting.  ED Course: White count is 15.9 hemoglobin 9.0 and platelet count of 252. Patient then passed 300-400 mL of maroon/bright red blood from the rectum and hemoglobin dropped to 7.5.  Patient was given platelets and blood products.  She also underwent a CT angio which showed no evidence of active or acute arterial bleeding within the bowel, also identified improving left lower quadrant descending colon diverticulitis  Past Medical History:  Diagnosis Date  . Diabetes mellitus without complication ([place])   . Dialysis patient ([place])   . Hypertension   . Lupus [facility])     Past Surgical History:  Procedure Laterality Date  . CARDIAC SURGERY     cardiac shunt    Family  history: No colon cancer or polyps  Social History   Tobacco Use  . Smoking status: Never Smoker  . Smokeless tobacco: Never Used  Substance Use Topics  . Alcohol use: Never    Frequency: Never  . Drug use: Never    Prior to Admission medications   Medication Sig Start Date End Date Taking? Authorizing Provider  amLODipine (NORVASC) 10 MG tablet Take 10 mg by mouth daily. 11/23/17   [provider]  atorvastatin (LIPITOR) 40 MG tablet Take 40 mg by mouth every evening. 10/25/17   [provider]  clopidogrel (PLAVIX) 75 MG tablet Take 75 mg by mouth daily. 11/28/17   [provider]  insulin aspart protamine- aspart (NOVOLOG MIX 70/30) (70-30) 100 UNIT/ML injection Inject 30-40 Units into the skin See admin instructions. 40 units in the morning, 30 units at night    [provider]  lidocaine-prilocaine (EMLA) cream Apply 1 application topically daily. 11/18/17   [provider]  metoprolol tartrate (LOPRESSOR) 25 MG tablet Take 25 mg by mouth 2 (two) times daily. 11/23/17   [provider]  predniSONE (DELTASONE) 10 MG tablet Take 10 mg by mouth daily. 11/03/17   [provider]    Current Facility-Administered Medications  Medication Dose Route Frequency Provider Last Rate Last Dose  . 0.9 %  sodium chloride infusion (Manually program via Guardrails IV Fluids)   Intravenous Once Duffy Bruce, MD       Current Outpatient Medications  Medication Sig Dispense Refill  .  amLODipine (NORVASC) 10 MG tablet Take 10 mg by mouth daily.    Marland Kitchen atorvastatin (LIPITOR) 40 MG tablet Take 40 mg by mouth every evening.    . clopidogrel (PLAVIX) 75 MG tablet Take 75 mg by mouth daily.    . insulin aspart protamine- aspart (NOVOLOG MIX 70/30) (70-30) 100 UNIT/ML injection Inject 30-40 Units into the skin See admin instructions. 40 units in the morning, 30 units at night    . lidocaine-prilocaine (EMLA) cream Apply 1 application topically  daily.    . metoprolol tartrate (LOPRESSOR) 25 MG tablet Take 25 mg by mouth 2 (two) times daily.    . predniSONE (DELTASONE) 10 MG tablet Take 10 mg by mouth daily.      Allergies as of 12/15/2017 - Review Complete 12/15/2017  Allergen Reaction Noted  . Penicillins Hives 06/20/2017     Review of Systems:    Constitutional: No weight loss, fever or chills Skin: No rash Cardiovascular: No chest pain Respiratory: No SOB Gastrointestinal: See HPI and otherwise negative Genitourinary: No dysuria  Neurological: +dizziness and feeling of near-syncope Musculoskeletal: No new muscle or joint pain Hematologic: No bruising Psychiatric: No history of depression or anxiety     Physical Exam:  Vital signs in last 24 hours: Temp:  [97.8 F (36.6 C)-97.9 F (36.6 C)] 97.8 F (36.6 C) (10/19 1230) Pulse Rate:  [82-104] 84 (10/19 1315) Resp:  [14-29] 27 (10/19 1315) BP: (99-128)/(59-84) 124/70 (10/19 1315) SpO2:  [99 %-100 %] 100 % (10/19 1315) Weight:  [68 kg] 68 kg (10/19 1024)   General:   Pleasant weak appearing, AA female appears to be in NAD, Well developed, Well nourished, alert and cooperative Head:  Normocephalic and atraumatic. Eyes:   PEERL, EOMI. No icterus. Conjunctiva pink. Ears:  Normal auditory acuity. Neck:  Supple Throat: Oral cavity and pharynx without inflammation, swelling or lesion.  Lungs: Respirations even and unlabored. Lungs clear to auscultation bilaterally.   No wheezes, crackles, or rhonchi.  Heart: Normal S1, S2. No MRG. Regular rate and rhythm. No peripheral edema, cyanosis or pallor.  Abdomen:  Soft, nondistended, nontender. No rebound or guarding. Normal bowel sounds. No appreciable masses or hepatomegaly. Rectal:  Not performed.  Msk:  Symmetrical without gross deformities. Peripheral pulses intact.  Extremities:  Without edema, no deformity or joint abnormality.  Neurologic:  Alert and  oriented x4;  grossly normal neurologically.  Skin:   Dry and  intact without significant lesions or rashes. Psychiatric: Demonstrates good judgement and reason without abnormal affect or behaviors.   LAB RESULTS: Recent Labs    12/15/17 1037 12/15/17 1049  WBC 15.9*  --   HGB 7.5* 7.8*  HCT 23.9* 23.0*  PLT 252  --    BMET Recent Labs    12/15/17 1037 12/15/17 1049  NA 138 139  K 3.2* 3.3*  CL 101 98  CO2 28  --   GLUCOSE 176* 168*  BUN 28* 29*  CREATININE 5.84* 6.10*  CALCIUM 8.2*  --    LFT Recent Labs    12/15/17 1037  PROT 5.7*  ALBUMIN 2.6*  AST 27  ALT 18  ALKPHOS 70  BILITOT 0.5   STUDIES: Ct Angio Abd/pel W And/or Wo Contrast  Result Date: 12/15/2017 CLINICAL DATA:  Recent diverticulitis, lower GI bleeding EXAM: CT ANGIOGRAPHY ABDOMEN AND PELVIS WITH CONTRAST AND WITHOUT CONTRAST TECHNIQUE: Multidetector CT imaging of the abdomen and pelvis was performed using the standard protocol during bolus administration of intravenous contrast. Multiplanar reconstructed images  and MIPs were obtained and reviewed to evaluate the vascular anatomy. CONTRAST:  14mL ISOVUE-370 IOPAMIDOL (ISOVUE-370) INJECTION 76% COMPARISON:  12/01/2017 FINDINGS: VASCULAR Aorta: Mild-to-moderate aortic atherosclerosis without occlusive disease, aneurysm, dissection, or acute vascular process. Celiac: Remains patent including its branches SMA: Remains patent including its branches. No evidence of active arterial bleeding or contrast extravasation within the bowel. Renals: Renal arteries remain patent. Accessory right renal artery also patent. IMA: Remains patent off the distal aorta including its branches. No active lower GI arterial bleeding appreciated. Inflow: Iliac vessels remain patent with atherosclerosis and tortuosity. No iliac inflow disease. Common, internal and external iliac arteries are patent. Proximal Outflow: Common femoral, proximal profunda femoral, proximal superficial femoral arteries are also patent. Veins: No veno-occlusive process.  Review of the MIP images confirms the above findings. NON-VASCULAR Lower chest: Clear lung bases. Mild cardiac enlargement. No pericardial pleural effusion. Small hiatal hernia noted. No acute lower chest finding. Hepatobiliary: No focal liver abnormality is seen. No gallstones, gallbladder wall thickening, or biliary dilatation. Pancreas: Unremarkable. No pancreatic ductal dilatation or surrounding inflammatory changes. Spleen: Normal in size without focal abnormality. Adrenals/Urinary Tract: No acute adrenal abnormality. Stable hypodense renal cysts some with peripheral wall calcifications. No renal obstruction or hydronephrosis. No hydroureter, ureteral calculus, or obstruction. Urinary bladder is collapsed likely accounting for wall thickening. Stomach/Bowel: Negative for bowel obstruction, significant dilatation, ileus, or free air. Scattered colonic diverticulosis. Appendix not well visualized. No acute inflammatory process in the right lower quadrant. Previous left lower quadrant descending colon diverticulitis shows interval improvement. There is less pericolonic strandy edema/inflammation. Adjacent extraluminal air has resolved. No fluid collection or abscess has developed. No new areas of additional diverticulitis. Lymphatic: No adenopathy. Reproductive: Uterus and adnexa unremarkable. No pelvic free fluid, fluid collection, hemorrhage or hematoma. No abscess. Other: No abdominal wall hernia or abnormality. No abdominopelvic ascites. Musculoskeletal: Degenerative changes of the spine SI joints. No acute osseous finding. IMPRESSION: VASCULAR Abdominal aortoiliac atherosclerosis without occlusive disease, aneurysm, dissection, or acute vascular process. No evidence of active or acute arterial bleeding within the bowel by CTA. NON-VASCULAR Improving left lower quadrant descending colon diverticulitis as detailed above. No associated obstruction, developing abscess, ascites or free fluid Stable bilateral  minimally complex renal cysts Electronically Signed   By: Jerilynn Mages.  Shick M.D.   On: 12/15/2017 12:45    Impression / Plan:   Impression: 1.  Hematochezia: Large volume hematochezia today, at least 300-400 mL's ER requiring blood and platelet transfusion, recent episode of diverticulitis/ finished antibiotics this morning, no abdominal pain, no prior colonoscopy, patient is maintained on Plavix for stent placement 2 years ago, last dose yesterday afternoon CT Angio with no active or acute arterial bleeding; most likely diverticular bleed 2.  Acute blood loss anemia: With above, hemoglobin 7.8 currently 3.  ESRD on HD 4.  Chronic anticoagulation: With Plavix status post stent placement in 2017, last dose 12/14/2017 around 2 PM  Plan: 1.  Continue to monitor patient with transfusion as needed for hemoglobin less than 7 2.  Agree with reversal of anticoagulation with platelets  3.  Patient will likely need a colonoscopy, possibly during this admission, but given recent diverticulitis, will defer timing to Dr. Hilarie Fredrickson 4.  If patient has another large volume episode of hematochezia would recommend repeat CT Angio vs tagged RBC 5.  Patient to remain n.p.o. Today 6. Will start Pantoprazole 40mg  qd empirically 7.  Please await further recommendations from Dr. Hilarie Fredrickson later today  Thank you for your kind consultation, we  will continue to follow.  Lavone Nian Lemmon  12/15/2017, 1:50 PM

## 2017-12-15 NOTE — ED Notes (Signed)
Daughter called Tech into room. Pt grabbing at cords and appeared to be disoriented. This RN in room, pt appeared lethargic but now coming around and answering questions appropriately. VSS

## 2017-12-15 NOTE — ED Triage Notes (Signed)
Per [facility] pt coming from dialysis center (did not finish treatment) reports she had a large amount of rectal bleeding. Reports small amount of yesterday. Pt on plavix. Reports diverticulitis diagnosed a couple weeks ago, currently on antibiotics.

## 2017-12-15 NOTE — ED Provider Notes (Addendum)
[facility] EMERGENCY DEPARTMENT Provider Note   CSN: [id number] Arrival date & time: 12/15/17  1019     History   Chief Complaint Chief Complaint  Patient presents with  . Rectal Bleeding    HPI Paula Bass is a 47 y.o. female.  HPI 47 year old female with complex past medical history including history of lupus, CAD on Plavix here with rectal bleeding.  The patient states she was in her usual state of health upon going to dialysis today.  She was recently hospitalized for acute diverticulitis and is currently on antibiotics.  She has been feeling somewhat better.  However, during dialysis she began to develop lower abdominal pain and cramping.  She then had a grossly bloody, large bowel movement.  She began to feel lightheaded.  She has since had an additional bowel movement of approximately 300 cc of gross blood.  She feels lightheaded and like she is going to pass out.  No fevers or chills.  No increased pain until she started bleeding.  She took her Plavix around 130 yesterday afternoon.   Past Medical History:  Diagnosis Date  . CAD (coronary artery disease) 2016  . Diabetes mellitus without complication ([place])   . Dialysis patient ([place])   . Diverticulitis   . GI bleeding   . Hypertension   . Lupus [facility])     Patient Active Problem List   Diagnosis Date Noted  . Hematochezia 12/15/2017  . Lower GI bleed   . Acute diverticulitis 12/01/2017  . ESRD (end stage renal disease) ([place]) 12/01/2017  . DM2 (diabetes mellitus, type 2) ([place]) 12/01/2017  . Lupus ([place]) 12/01/2017  . Benign essential HTN 12/01/2017  . UTI (urinary tract infection) 12/01/2017    Past Surgical History:  Procedure Laterality Date  . CARDIAC SURGERY     cardiac shunt     OB History   None      Home Medications    Prior to Admission medications   Medication Sig Start Date End Date Taking? Authorizing Provider  amLODipine (NORVASC) 10 MG tablet Take 10 mg by mouth  daily. 11/23/17  Yes [provider]  aspirin EC 81 MG tablet Take 81 mg by mouth daily.   Yes [provider]  atorvastatin (LIPITOR) 40 MG tablet Take 40 mg by mouth every evening. 10/25/17  Yes [provider]  clopidogrel (PLAVIX) 75 MG tablet Take 75 mg by mouth daily. 11/28/17  Yes [provider]  insulin aspart protamine- aspart (NOVOLOG MIX 70/30) (70-30) 100 UNIT/ML injection Inject 30-40 Units into the skin See admin instructions. 40 units in the morning, 30 units at night   Yes [provider]  lidocaine-prilocaine (EMLA) cream Apply 1 application topically daily. 11/18/17  Yes [provider]  metoprolol tartrate (LOPRESSOR) 25 MG tablet Take 25 mg by mouth 2 (two) times daily. 11/23/17  Yes [provider]  metroNIDAZOLE (FLAGYL) 500 MG tablet Take 500 mg by mouth 3 (three) times daily.   Yes [provider]  OVER THE COUNTER MEDICATION Take 1 tablet by mouth daily. Pt takes OTC venavit tablet PO daily   Yes [provider]  predniSONE (DELTASONE) 10 MG tablet Take 10 mg by mouth daily. 11/03/17  Yes [provider]    Family History No family history on file.  Social History Social History   Tobacco Use  . Smoking status: Never Smoker  . Smokeless tobacco: Never Used  Substance Use Topics  . Alcohol use: Never  Frequency: Never  . Drug use: Never     Allergies   Penicillins   Review of Systems Review of Systems  Constitutional: Positive for fatigue. Negative for chills and fever.  HENT: Negative for congestion and rhinorrhea.   Eyes: Negative for visual disturbance.  Respiratory: Negative for cough, shortness of breath and wheezing.   Cardiovascular: Negative for chest pain and leg swelling.  Gastrointestinal: Positive for abdominal pain and blood in stool. Negative for diarrhea, nausea and vomiting.  Genitourinary: Negative for dysuria and flank pain.  Musculoskeletal:  Negative for neck pain and neck stiffness.  Skin: Negative for rash and wound.  Allergic/Immunologic: Negative for immunocompromised state.  Neurological: Positive for syncope and light-headedness. Negative for weakness and headaches.  All other systems reviewed and are negative.    Physical Exam Updated Vital Signs BP 132/81   Pulse 92   Temp 98.4 F (36.9 C) (Oral)   Resp 16   Ht 5\' 2"  (1.575 m)   Wt 68 kg   SpO2 100%   BMI 27.42 kg/m   Physical Exam  Constitutional: She is oriented to person, place, and time. She appears well-developed and well-nourished. No distress.  HENT:  Head: Normocephalic and atraumatic.  Mouth/Throat: Oropharynx is clear and moist.  Eyes: Conjunctivae are normal.  Neck: Neck supple.  Cardiovascular: Normal rate, regular rhythm and normal heart sounds. Exam reveals no friction rub.  No murmur heard. Pulmonary/Chest: Effort normal and breath sounds normal. No respiratory distress. She has no wheezes. She has no rales.  Abdominal: Soft. Bowel sounds are normal. She exhibits no distension. There is tenderness (Lower abdominal). There is no rebound and no guarding.  Genitourinary:  Genitourinary Comments: There is a significant amount of grossly bloody stool noted in the bed, approximately 300 cc.  Stool is maroon-colored with mixed bright red blood.  Small amount of clots noted.  Musculoskeletal: She exhibits no edema.  Neurological: She is alert and oriented to person, place, and time. She exhibits normal muscle tone.  Skin: Skin is warm. Capillary refill takes less than 2 seconds.  Psychiatric: She has a normal mood and affect.  Nursing note and vitals reviewed.    ED Treatments / Results  Labs (all labs ordered are listed, but only abnormal results are displayed) Labs Reviewed  COMPREHENSIVE METABOLIC PANEL - Abnormal; Notable for the following components:      Result Value   Potassium 3.2 (*)    Glucose, Bld 176 (*)    BUN 28 (*)     Creatinine, Ser 5.84 (*)    Calcium 8.2 (*)    Total Protein 5.7 (*)    Albumin 2.6 (*)    GFR calc non Af Amer 8 (*)    GFR calc Af Amer 9 (*)    All other components within normal limits  CBC - Abnormal; Notable for the following components:   WBC 15.9 (*)    RBC 2.57 (*)    Hemoglobin 7.5 (*)    HCT 23.9 (*)    RDW 16.0 (*)    All other components within normal limits  CBC - Abnormal; Notable for the following components:   WBC 15.5 (*)    RBC 2.54 (*)    Hemoglobin 7.6 (*)    HCT 23.2 (*)    All other components within normal limits  HEMOGLOBIN A1C - Abnormal; Notable for the following components:   Hgb A1c MFr Bld 7.0 (*)    All other components within normal limits  I-STAT CHEM 8, ED - Abnormal; Notable for the following components:   Potassium 3.3 (*)    BUN 29 (*)    Creatinine, Ser 6.10 (*)    Glucose, Bld 168 (*)    Calcium, Ion 1.07 (*)    Hemoglobin 7.8 (*)    HCT 23.0 (*)    All other components within normal limits  I-STAT CG4 LACTIC ACID, ED - Abnormal; Notable for the following components:   Lactic Acid, Venous 1.95 (*)    All other components within normal limits  PROTIME-INR  APTT  CBC  CBC  LACTIC ACID, PLASMA  I-STAT BETA HCG BLOOD, ED ([facility], [facility], [facility] ONLY)  TYPE AND SCREEN  PREPARE RBC (CROSSMATCH)  PREPARE PLATELET PHERESIS  ABO/RH    EKG None  Radiology Ct Angio Abd/pel W And/or Wo Contrast  Result Date: 12/15/2017 CLINICAL DATA:  Recent diverticulitis, lower GI bleeding EXAM: CT ANGIOGRAPHY ABDOMEN AND PELVIS WITH CONTRAST AND WITHOUT CONTRAST TECHNIQUE: Multidetector CT imaging of the abdomen and pelvis was performed using the standard protocol during bolus administration of intravenous contrast. Multiplanar reconstructed images and MIPs were obtained and reviewed to evaluate the vascular anatomy. CONTRAST:  90mL ISOVUE-370 IOPAMIDOL (ISOVUE-370) INJECTION 76% COMPARISON:  12/01/2017 FINDINGS: VASCULAR Aorta: Mild-to-moderate aortic  atherosclerosis without occlusive disease, aneurysm, dissection, or acute vascular process. Celiac: Remains patent including its branches SMA: Remains patent including its branches. No evidence of active arterial bleeding or contrast extravasation within the bowel. Renals: Renal arteries remain patent. Accessory right renal artery also patent. IMA: Remains patent off the distal aorta including its branches. No active lower GI arterial bleeding appreciated. Inflow: Iliac vessels remain patent with atherosclerosis and tortuosity. No iliac inflow disease. Common, internal and external iliac arteries are patent. Proximal Outflow: Common femoral, proximal profunda femoral, proximal superficial femoral arteries are also patent. Veins: No veno-occlusive process. Review of the MIP images confirms the above findings. NON-VASCULAR Lower chest: Clear lung bases. Mild cardiac enlargement. No pericardial pleural effusion. Small hiatal hernia noted. No acute lower chest finding. Hepatobiliary: No focal liver abnormality is seen. No gallstones, gallbladder wall thickening, or biliary dilatation. Pancreas: Unremarkable. No pancreatic ductal dilatation or surrounding inflammatory changes. Spleen: Normal in size without focal abnormality. Adrenals/Urinary Tract: No acute adrenal abnormality. Stable hypodense renal cysts some with peripheral wall calcifications. No renal obstruction or hydronephrosis. No hydroureter, ureteral calculus, or obstruction. Urinary bladder is collapsed likely accounting for wall thickening. Stomach/Bowel: Negative for bowel obstruction, significant dilatation, ileus, or free air. Scattered colonic diverticulosis. Appendix not well visualized. No acute inflammatory process in the right lower quadrant. Previous left lower quadrant descending colon diverticulitis shows interval improvement. There is less pericolonic strandy edema/inflammation. Adjacent extraluminal air has resolved. No fluid collection or  abscess has developed. No new areas of additional diverticulitis. Lymphatic: No adenopathy. Reproductive: Uterus and adnexa unremarkable. No pelvic free fluid, fluid collection, hemorrhage or hematoma. No abscess. Other: No abdominal wall hernia or abnormality. No abdominopelvic ascites. Musculoskeletal: Degenerative changes of the spine SI joints. No acute osseous finding. IMPRESSION: VASCULAR Abdominal aortoiliac atherosclerosis without occlusive disease, aneurysm, dissection, or acute vascular process. No evidence of active or acute arterial bleeding within the bowel by CTA. NON-VASCULAR Improving left lower quadrant descending colon diverticulitis as detailed above. No associated obstruction, developing abscess, ascites or free fluid Stable bilateral minimally complex renal cysts Electronically Signed   By: Jerilynn Mages.  Shick M.D.   On: 12/15/2017 12:45    Procedures .Critical Care Performed by: Duffy Bruce, MD Authorized by:  Duffy Bruce, MD   Critical care provider statement:    Critical care time (minutes):  35   Critical care time was exclusive of:  Separately billable procedures and treating other patients and teaching time   Critical care was necessary to treat or prevent imminent or life-threatening deterioration of the following conditions:  Circulatory failure and cardiac failure   Critical care was time spent personally by me on the following activities:  Development of treatment plan with patient or surrogate, discussions with consultants, evaluation of patient's response to treatment, examination of patient, obtaining history from patient or surrogate, ordering and performing treatments and interventions, ordering and review of laboratory studies, ordering and review of radiographic studies, pulse oximetry, re-evaluation of patient's condition and review of old charts   I assumed direction of critical care for this patient from another provider in my specialty: no     (including critical  care time)  Medications Ordered in ED Medications  0.9 %  sodium chloride infusion (Manually program via Guardrails IV Fluids) ( Intravenous Not Given 12/15/17 1115)  amLODipine (NORVASC) tablet 10 mg (has no administration in time range)  atorvastatin (LIPITOR) tablet 40 mg (has no administration in time range)  metoprolol tartrate (LOPRESSOR) tablet 25 mg (has no administration in time range)  insulin glargine (LANTUS) injection 15 Units (has no administration in time range)  hydrocortisone sodium succinate (SOLU-CORTEF) 100 MG injection 50 mg (has no administration in time range)  pantoprazole (PROTONIX) injection 40 mg (has no administration in time range)  sodium chloride flush (NS) 0.9 % injection 3 mL (has no administration in time range)  lactated ringers infusion (has no administration in time range)  insulin aspart (novoLOG) injection 0-9 Units (has no administration in time range)  insulin aspart (novoLOG) injection 0-5 Units (has no administration in time range)  acetaminophen (TYLENOL) tablet 650 mg (has no administration in time range)    Or  acetaminophen (TYLENOL) suppository 650 mg (has no administration in time range)  zolpidem (AMBIEN) tablet 5 mg (has no administration in time range)  sorbitol 70 % solution 30 mL (has no administration in time range)  docusate sodium (ENEMEEZ) enema 283 mg (has no administration in time range)  ondansetron (ZOFRAN) tablet 4 mg (has no administration in time range)    Or  ondansetron (ZOFRAN) injection 4 mg (has no administration in time range)  camphor-menthol (SARNA) lotion 1 application (has no administration in time range)    And  hydrOXYzine (ATARAX/VISTARIL) tablet 25 mg (has no administration in time range)  calcium carbonate (dosed in mg elemental calcium) suspension 500 mg of elemental calcium (has no administration in time range)  feeding supplement (NEPRO CARB STEADY) liquid 237 mL (has no administration in time range)    ciprofloxacin (CIPRO) IVPB 400 mg (has no administration in time range)  metroNIDAZOLE (FLAGYL) IVPB 500 mg (has no administration in time range)  ondansetron (ZOFRAN) 4 MG/2ML injection (4 mg  Given 12/15/17 1039)  0.9 %  sodium chloride infusion (Manually program via Guardrails IV Fluids) ( Intravenous Stopped 12/15/17 1529)  iopamidol (ISOVUE-370) 76 % injection 80 mL (80 mLs Intravenous Contrast Given 12/15/17 1142)    Emergency Ultrasound Study:   Angiocath insertion Performed by: Evonnie Pat Consent: Verbal consent/emergent consent obtained. Risks and benefits: risks, benefits and alternatives were discussed Immediately prior to procedure the correct patient, procedure, equipment, support staff and site/side marked as needed.  Indication: difficult IV access Preparation: Patient was prepped and draped in the usual sterile  fashion. Sterile gel was used for this procedure and the ultrasound probe was sterilized prior to use. Vein Location: Left AC vein was visualized during assessment for potential access sites and was found to be patent/ easily compressed with linear ultrasound.  The needle was visualized with real-time ultrasound and guided into the vein. Gauge: 18  Image saved and stored.  Normal blood return.   Patient tolerance: Patient tolerated the procedure well with no immediate complications.       Initial Impression / Assessment and Plan / ED Course  I have reviewed the triage vital signs and the nursing notes.  Pertinent labs & imaging results that were available during my care of the patient were reviewed by me and considered in my medical decision making (see chart for details).     47 yo F here with bright red blood per rectum. On arrival, tp with large volume, approx 300 cc maroon stool. She is hypotensive and tachycardic. Reviewed records, concern for diverticular bleed on plavix and possibly with heparin effect from HD. Given hypotension and active  bleed, will empirically transfuse 1u PRBC and platelets given palvix use. D/w radiology - will send for stat CT Angio to eval for possible active bleed amenable to IR. PIV placed by myself.  CT Angio neg. Labs do show significant hemoglobin drop from baseline. D/w [facility] who will see pt. Admit ot medicine. Pt stable after transfusion.  Final Clinical Impressions(s) / ED Diagnoses   Final diagnoses:  Rectal bleeding  Blood loss anemia    ED Discharge Orders    None       Duffy Bruce, MD 12/15/17 1654    Duffy Bruce, MD 01/03/18 1156

## 2017-12-15 NOTE — H&P (Signed)
History and Physical    Paula Bass KDT:[id number] DOB: 01-29-1971 DOA: 12/15/2017  PCP: Patient, No Pcp Per Consultants:  Moshe Cipro - nephrology Patient coming from:  Home - lives with daughter, daughter, and her son and daughter; Paula Bass: Husband, [phone]  Chief Complaint: hematochezia  HPI: Paula Bass is a 47 y.o. female with medical history significant of SLE on chronic prednisone, ESRD on HD TTS, HTN, T2DM who recently moved from [place] who presented to the ED after an episode of large-volume hematochezia.  She was at HD today and felt the need to have a BM.  She held it as long as she could and had a large bloody BM.  She continued with active bleeding after arrival here, "just one big episode that was just bleeding and bleeding and bleeding."  Her bleeding is slowly stopping.  During her prior admission, she did not have bleeding.  She feels better now, although she has not attempted to get out of bed.  She has received blood one time prior, when she had a stent placed.  She was admitted to [facility] from 10/5-8 for acute diverticulitis with microperforation.  She was discharged with antibiotics and has not had time to establish with a new PCP.  ED Course:  Likely a diverticular bleed.  Recently admitted for diverticulitis.  ESRD, on Plavix. She went to HD today, grossly bloody BM in the chair.  Large volume BM upon arrival, given 1 unit PRBC when BP dropped.  Also given platelets.  Hgb <7.  CT without extravasation, nothing to embolize.  GI has seen.  Needs admission to SDU for now.  Review of Systems: As per HPI; otherwise review of systems reviewed and negative.   Ambulatory Status:  Ambulates without assistance  Past Medical History:  Diagnosis Date  . CAD (coronary artery disease) 2016  . Diabetes mellitus without complication ([place])   . Dialysis patient ([place])   . Diverticulitis   . GI bleeding   . Hypertension   . Lupus [facility])     Past Surgical History:    Procedure Laterality Date  . CARDIAC SURGERY     cardiac shunt    Social History   Socioeconomic History  . Marital status: Single    Spouse name: Not on file  . Number of children: Not on file  . Years of education: Not on file  . Highest education level: Not on file  Occupational History  . Occupation: disabled  Social Needs  . Financial resource strain: Not on file  . Food insecurity:    Worry: Not on file    Inability: Not on file  . Transportation needs:    Medical: Not on file    Non-medical: Not on file  Tobacco Use  . Smoking status: Never Smoker  . Smokeless tobacco: Never Used  Substance and Sexual Activity  . Alcohol use: Never    Frequency: Never  . Drug use: Never  . Sexual activity: Not on file  Lifestyle  . Physical activity:    Days per week: Not on file    Minutes per session: Not on file  . Stress: Not on file  Relationships  . Social connections:    Talks on phone: Not on file    Gets together: Not on file    Attends religious service: Not on file    Active member of club or organization: Not on file    Attends meetings of clubs or organizations: Not on file    Relationship  status: Not on file  . Intimate partner violence:    Fear of current or ex partner: Not on file    Emotionally abused: Not on file    Physically abused: Not on file    Forced sexual activity: Not on file  Other Topics Concern  . Not on file  Social History Narrative  . Not on file    Allergies  Allergen Reactions  . Penicillins Hives    No family history on file.  Prior to Admission medications   Medication Sig Start Date End Date Taking? Authorizing Provider  amLODipine (NORVASC) 10 MG tablet Take 10 mg by mouth daily. 11/23/17   [provider]  atorvastatin (LIPITOR) 40 MG tablet Take 40 mg by mouth every evening. 10/25/17   [provider]  clopidogrel (PLAVIX) 75 MG tablet Take 75 mg by mouth daily. 11/28/17   [provider]   insulin aspart protamine- aspart (NOVOLOG MIX 70/30) (70-30) 100 UNIT/ML injection Inject 30-40 Units into the skin See admin instructions. 40 units in the morning, 30 units at night    [provider]  lidocaine-prilocaine (EMLA) cream Apply 1 application topically daily. 11/18/17   [provider]  metoprolol tartrate (LOPRESSOR) 25 MG tablet Take 25 mg by mouth 2 (two) times daily. 11/23/17   [provider]  predniSONE (DELTASONE) 10 MG tablet Take 10 mg by mouth daily. 11/03/17   [provider]    Physical Exam: Vitals:   12/15/17 1400 12/15/17 1415 12/15/17 1430 12/15/17 1445  BP: 130/75 125/75 128/74 133/85  Pulse: 91 92 86 90  Resp: (!) 29 13 18 19   Temp:      TempSrc:      SpO2: 98% 99% 100% 100%  Weight:      Height:         General:  Appears calm and comfortable and is NAD; she does appear to be fatigued Eyes:  PERRL, EOMI, normal lids, iris ENT:  grossly normal hearing, lips & tongue, mmm; appropriate dentition Neck:  no LAD, masses or thyromegaly Cardiovascular:  RRR, no m/r/g. No LE edema.  Respiratory:   CTA bilaterally with no wheezes/rales/rhonchi.  Normal respiratory effort. Abdomen:  soft, NT, ND, NABS; she has a flexseal in and has over 213mL of dark red in the vacutainer Skin:  no rash or induration seen on limited exam Musculoskeletal:  grossly normal tone BUE/BLE, good ROM, no bony abnormality Lower extremity:  No LE edema.  Limited foot exam with no ulcerations.  2+ distal pulses. Psychiatric:  grossly normal mood and affect, speech fluent and appropriate, AOx3 Neurologic:  CN 2-12 grossly intact, moves all extremities in coordinated fashion, sensation intact    Radiological Exams on Admission: Ct Angio Abd/pel W And/or Wo Contrast  Result Date: 12/15/2017 CLINICAL DATA:  Recent diverticulitis, lower GI bleeding EXAM: CT ANGIOGRAPHY ABDOMEN AND PELVIS WITH CONTRAST AND WITHOUT CONTRAST TECHNIQUE: Multidetector CT  imaging of the abdomen and pelvis was performed using the standard protocol during bolus administration of intravenous contrast. Multiplanar reconstructed images and MIPs were obtained and reviewed to evaluate the vascular anatomy. CONTRAST:  5mL ISOVUE-370 IOPAMIDOL (ISOVUE-370) INJECTION 76% COMPARISON:  12/01/2017 FINDINGS: VASCULAR Aorta: Mild-to-moderate aortic atherosclerosis without occlusive disease, aneurysm, dissection, or acute vascular process. Celiac: Remains patent including its branches SMA: Remains patent including its branches. No evidence of active arterial bleeding or contrast extravasation within the bowel. Renals: Renal arteries remain patent. Accessory right renal artery also patent. IMA: Remains patent off the  distal aorta including its branches. No active lower GI arterial bleeding appreciated. Inflow: Iliac vessels remain patent with atherosclerosis and tortuosity. No iliac inflow disease. Common, internal and external iliac arteries are patent. Proximal Outflow: Common femoral, proximal profunda femoral, proximal superficial femoral arteries are also patent. Veins: No veno-occlusive process. Review of the MIP images confirms the above findings. NON-VASCULAR Lower chest: Clear lung bases. Mild cardiac enlargement. No pericardial pleural effusion. Small hiatal hernia noted. No acute lower chest finding. Hepatobiliary: No focal liver abnormality is seen. No gallstones, gallbladder wall thickening, or biliary dilatation. Pancreas: Unremarkable. No pancreatic ductal dilatation or surrounding inflammatory changes. Spleen: Normal in size without focal abnormality. Adrenals/Urinary Tract: No acute adrenal abnormality. Stable hypodense renal cysts some with peripheral wall calcifications. No renal obstruction or hydronephrosis. No hydroureter, ureteral calculus, or obstruction. Urinary bladder is collapsed likely accounting for wall thickening. Stomach/Bowel: Negative for bowel obstruction,  significant dilatation, ileus, or free air. Scattered colonic diverticulosis. Appendix not well visualized. No acute inflammatory process in the right lower quadrant. Previous left lower quadrant descending colon diverticulitis shows interval improvement. There is less pericolonic strandy edema/inflammation. Adjacent extraluminal air has resolved. No fluid collection or abscess has developed. No new areas of additional diverticulitis. Lymphatic: No adenopathy. Reproductive: Uterus and adnexa unremarkable. No pelvic free fluid, fluid collection, hemorrhage or hematoma. No abscess. Other: No abdominal wall hernia or abnormality. No abdominopelvic ascites. Musculoskeletal: Degenerative changes of the spine SI joints. No acute osseous finding. IMPRESSION: VASCULAR Abdominal aortoiliac atherosclerosis without occlusive disease, aneurysm, dissection, or acute vascular process. No evidence of active or acute arterial bleeding within the bowel by CTA. NON-VASCULAR Improving left lower quadrant descending colon diverticulitis as detailed above. No associated obstruction, developing abscess, ascites or free fluid Stable bilateral minimally complex renal cysts Electronically Signed   By: Jerilynn Mages.  Shick M.D.   On: 12/15/2017 12:45    EKG: not done   Labs on Admission: I have personally reviewed the available labs and imaging studies at the time of the admission.  Pertinent labs:   K+ 3.2 Glucose 176 BUN 28/Creatinine 5.84/GFR 8 Albumin 2.6 WBC 15.9 Hgb 7.5; 9.0 on 10/8 Lactate 1.95  Assessment/Plan Principal Problem:   Hematochezia Active Problems:   ESRD (end stage renal disease) ([place])   DM2 (diabetes mellitus, type 2) ([facility])   Lupus ([facility])   Benign essential HTN   Hematochezia -Patient with recent prior admission for acute diverticulitis with microperforation -Now presenting with a prolonged episode of large-volume (massive?) hematochezia -Differential to include bleeding hemorrhoids, bacterial  infectious colitis with likely pathogen Campylobacter jejuni ,and AVM - but given her recent presentation her most likely diagnosis is diverticular bleeding.  -GI consulted in the ER -Overnight Observation in SDU given hemodynamic instability in the setting of large-volume bleeding, now improved -CBC q6h; transfuse for Hgb <7 -Continue to monitor for recurrent bleeding  -Will give 50cc/hour LR x 10 hours for total volume of 500 cc given significant acute blood loss -She has already been transfused 1 unit PRBC while in the ER, as well -Although this is much less likely UGI in nature, will give at least 24 hours of IV Protonix (unless GI recommends stopping) -Will hold Plavix in the setting of active bleeding  ESRD -Patient on chronic TTS HD -Nephrology prn order set utilized -She does not appear to be volume overloaded or otherwise in need of acute HD -Nephrology will need to be notified if patient remains hospitalized through Monday or becomes acutely volume overloaded in the meantime  SLE -She takes chronic prednisone -Will give stress-dosed steroids for now with hydrocortisone 50 mg IV q8h  HTN -Resume Norvasc tomorrow - she was hypotensive during her large episode in the ER but appears to have stabilized at this time  DM -Will check A1c -hold 70/30 insulin (40 qAM, 30 qhs) - will change to Lantus 15 BID for now since patient is NPO (slightly decreased dose and also without standing short-acting insulin) -Cover with sensitive-scale SSI    DVT prophylaxis:  SCDs Code Status:  Full - confirmed with patient/family Family Communication: Daughter present throughout evaluation  Disposition Plan:  Home once clinically improved Consults called: GI  Admission status: It is my clinical opinion that referral for OBSERVATION is reasonable and necessary in this patient based on the above information provided. The aforementioned taken together are felt to place the patient at high risk for  further clinical deterioration. However it is anticipated that the patient may be medically stable for discharge from the hospital within 24 to 48 hours.     Karmen Bongo MD [facility]  If note is complete, please contact covering daytime or nighttime physician. [email or web address] Password [email or web address]  12/15/2017, 3:15 PM

## 2017-12-15 NOTE — ED Notes (Signed)
Pt had large amount of blood come from rectum. Suctioned hooked up, 345ml suctioned from bed, and pt had soaked large amount of blood into brief prior to suctioning. Estimated 658ml blood loss.

## 2017-12-16 DIAGNOSIS — D500 Iron deficiency anemia secondary to blood loss (chronic): Secondary | ICD-10-CM | POA: Diagnosis not present

## 2017-12-16 DIAGNOSIS — K621 Rectal polyp: Secondary | ICD-10-CM | POA: Diagnosis present

## 2017-12-16 DIAGNOSIS — D62 Acute posthemorrhagic anemia: Secondary | ICD-10-CM | POA: Diagnosis present

## 2017-12-16 DIAGNOSIS — K625 Hemorrhage of anus and rectum: Secondary | ICD-10-CM | POA: Diagnosis not present

## 2017-12-16 DIAGNOSIS — Z7952 Long term (current) use of systemic steroids: Secondary | ICD-10-CM | POA: Diagnosis not present

## 2017-12-16 DIAGNOSIS — I959 Hypotension, unspecified: Secondary | ICD-10-CM | POA: Diagnosis present

## 2017-12-16 DIAGNOSIS — K641 Second degree hemorrhoids: Secondary | ICD-10-CM | POA: Diagnosis present

## 2017-12-16 DIAGNOSIS — K5731 Diverticulosis of large intestine without perforation or abscess with bleeding: Secondary | ICD-10-CM | POA: Diagnosis present

## 2017-12-16 DIAGNOSIS — K921 Melena: Secondary | ICD-10-CM | POA: Diagnosis not present

## 2017-12-16 DIAGNOSIS — Z79899 Other long term (current) drug therapy: Secondary | ICD-10-CM | POA: Diagnosis not present

## 2017-12-16 DIAGNOSIS — I2510 Atherosclerotic heart disease of native coronary artery without angina pectoris: Secondary | ICD-10-CM | POA: Diagnosis present

## 2017-12-16 DIAGNOSIS — Z7902 Long term (current) use of antithrombotics/antiplatelets: Secondary | ICD-10-CM | POA: Diagnosis not present

## 2017-12-16 DIAGNOSIS — Z794 Long term (current) use of insulin: Secondary | ICD-10-CM | POA: Diagnosis not present

## 2017-12-16 DIAGNOSIS — K5732 Diverticulitis of large intestine without perforation or abscess without bleeding: Secondary | ICD-10-CM

## 2017-12-16 DIAGNOSIS — K644 Residual hemorrhoidal skin tags: Secondary | ICD-10-CM | POA: Diagnosis present

## 2017-12-16 DIAGNOSIS — K922 Gastrointestinal hemorrhage, unspecified: Secondary | ICD-10-CM | POA: Diagnosis not present

## 2017-12-16 DIAGNOSIS — Z992 Dependence on renal dialysis: Secondary | ICD-10-CM | POA: Diagnosis not present

## 2017-12-16 DIAGNOSIS — E1122 Type 2 diabetes mellitus with diabetic chronic kidney disease: Secondary | ICD-10-CM | POA: Diagnosis present

## 2017-12-16 DIAGNOSIS — M329 Systemic lupus erythematosus, unspecified: Secondary | ICD-10-CM | POA: Diagnosis present

## 2017-12-16 DIAGNOSIS — K635 Polyp of colon: Secondary | ICD-10-CM | POA: Diagnosis present

## 2017-12-16 DIAGNOSIS — I120 Hypertensive chronic kidney disease with stage 5 chronic kidney disease or end stage renal disease: Secondary | ICD-10-CM | POA: Diagnosis present

## 2017-12-16 DIAGNOSIS — D631 Anemia in chronic kidney disease: Secondary | ICD-10-CM | POA: Diagnosis present

## 2017-12-16 DIAGNOSIS — E785 Hyperlipidemia, unspecified: Secondary | ICD-10-CM | POA: Diagnosis present

## 2017-12-16 DIAGNOSIS — Z880 Allergy status to penicillin: Secondary | ICD-10-CM | POA: Diagnosis not present

## 2017-12-16 DIAGNOSIS — N186 End stage renal disease: Secondary | ICD-10-CM | POA: Diagnosis present

## 2017-12-16 HISTORY — DX: Gastrointestinal hemorrhage, unspecified: K92.2

## 2017-12-16 LAB — BPAM PLATELET PHERESIS
BLOOD PRODUCT EXPIRATION DATE: 201910212359
BLOOD PRODUCT EXPIRATION DATE: 201910222359
ISSUE DATE / TIME: 201910201446
UNIT TYPE AND RH: 6200
UNIT TYPE AND RH: 9500

## 2017-12-16 LAB — HEMOGLOBIN AND HEMATOCRIT, BLOOD
HCT: 22.6 % — ABNORMAL LOW (ref 36.0–46.0)
HCT: 22.1 % — ABNORMAL LOW (ref 36.0–46.0)
HEMOGLOBIN: 7.3 g/dL — AB (ref 12.0–15.0)
Hemoglobin: 7.4 g/dL — ABNORMAL LOW (ref 12.0–15.0)

## 2017-12-16 LAB — CBC
HCT: 22.3 % — ABNORMAL LOW (ref 36.0–46.0)
HCT: 22.2 % — ABNORMAL LOW (ref 36.0–46.0)
HEMOGLOBIN: 7.1 g/dL — AB (ref 12.0–15.0)
Hemoglobin: 7.3 g/dL — ABNORMAL LOW (ref 12.0–15.0)
MCH: 29.6 pg (ref 26.0–34.0)
MCH: 29.1 pg (ref 26.0–34.0)
MCHC: 32.7 g/dL (ref 30.0–36.0)
MCHC: 32 g/dL (ref 30.0–36.0)
MCV: 90.3 fL (ref 80.0–100.0)
MCV: 91 fL (ref 80.0–100.0)
NRBC: 0 % (ref 0.0–0.2)
PLATELETS: 171 10*3/uL (ref 150–400)
Platelets: 176 10*3/uL (ref 150–400)
RBC: 2.47 MIL/uL — ABNORMAL LOW (ref 3.87–5.11)
RBC: 2.44 MIL/uL — ABNORMAL LOW (ref 3.87–5.11)
RDW: 15.9 % — AB (ref 11.5–15.5)
RDW: 15.9 % — ABNORMAL HIGH (ref 11.5–15.5)
WBC: 11.2 10*3/uL — AB (ref 4.0–10.5)
WBC: 11.9 10*3/uL — ABNORMAL HIGH (ref 4.0–10.5)
nRBC: 0 % (ref 0.0–0.2)

## 2017-12-16 LAB — BASIC METABOLIC PANEL
Anion gap: 9 (ref 5–15)
BUN: 35 mg/dL — ABNORMAL HIGH (ref 6–20)
CALCIUM: 8.1 mg/dL — AB (ref 8.9–10.3)
CO2: 25 mmol/L (ref 22–32)
Chloride: 103 mmol/L (ref 98–111)
Creatinine, Ser: 7.3 mg/dL — ABNORMAL HIGH (ref 0.44–1.00)
GFR, EST AFRICAN AMERICAN: 7 mL/min — AB (ref >60)
GFR, EST NON AFRICAN AMERICAN: 6 mL/min — AB (ref >60)
GLUCOSE: 152 mg/dL — AB (ref 70–99)
Potassium: 5.1 mmol/L (ref 3.5–5.1)
Sodium: 137 mmol/L (ref 135–145)

## 2017-12-16 LAB — PREPARE PLATELET PHERESIS
UNIT DIVISION: 0
Unit division: 0

## 2017-12-16 LAB — GLUCOSE, CAPILLARY
GLUCOSE-CAPILLARY: 148 mg/dL — AB (ref 70–99)
GLUCOSE-CAPILLARY: 113 mg/dL — AB (ref 70–99)
GLUCOSE-CAPILLARY: 258 mg/dL — AB (ref 70–99)
GLUCOSE-CAPILLARY: 237 mg/dL — AB (ref 70–99)
Glucose-Capillary: 204 mg/dL — ABNORMAL HIGH (ref 70–99)

## 2017-12-16 LAB — PREPARE RBC (CROSSMATCH)

## 2017-12-16 MED ORDER — HYDRALAZINE HCL 20 MG/ML IJ SOLN
10.0000 mg | Freq: Four times a day (QID) | INTRAMUSCULAR | Status: DC | PRN
  Administered 2017-12-17: 10 mg via INTRAVENOUS
  Filled 2017-12-16: qty 1

## 2017-12-16 MED ORDER — PANTOPRAZOLE SODIUM 40 MG PO TBEC
40.0000 mg | DELAYED_RELEASE_TABLET | Freq: Every day | ORAL | Status: DC
  Administered 2017-12-16 – 2017-12-19 (×3): 40 mg via ORAL
  Filled 2017-12-16 (×4): qty 1

## 2017-12-16 MED ORDER — PREDNISONE 20 MG PO TABS
20.0000 mg | ORAL_TABLET | Freq: Every day | ORAL | Status: DC
  Administered 2017-12-17 – 2017-12-19 (×2): 20 mg via ORAL
  Filled 2017-12-16 (×4): qty 1

## 2017-12-16 MED ORDER — LACTATED RINGERS IV SOLN
INTRAVENOUS | Status: DC
  Administered 2017-12-16: 07:00:00 via INTRAVENOUS

## 2017-12-16 MED ORDER — DEXTROSE-NACL 5-0.45 % IV SOLN
INTRAVENOUS | Status: DC
  Administered 2017-12-16: 13:00:00 via INTRAVENOUS

## 2017-12-16 MED ORDER — SODIUM CHLORIDE 0.9% IV SOLUTION: Freq: Once | INTRAVENOUS | Status: DC

## 2017-12-16 MED ORDER — INSULIN GLARGINE 100 UNIT/ML ~~LOC~~ SOLN
8.0000 [IU] | Freq: Two times a day (BID) | SUBCUTANEOUS | Status: DC
  Administered 2017-12-16 (×2): 8 [IU] via SUBCUTANEOUS
  Filled 2017-12-16 (×3): qty 0.08

## 2017-12-16 NOTE — Progress Notes (Signed)
Progress Note   Subjective  Patient feels better today but has not had anything to eat or drink. No further blood per rectum Her lower and left-sided abdominal pain has improved compared to yesterday and much much better compared to at recent diverticulitis diagnosis    Objective  Vital signs in last 24 hours: Temp:  [98.2 F (36.8 C)-98.7 F (37.1 C)] 98.2 F (36.8 C) (10/20 0433) Pulse Rate:  [81-96] 90 (10/20 1058) Resp:  [13-29] 24 (10/20 0642) BP: (116-162)/(70-98) 162/98 (10/20 1058) SpO2:  [96 %-100 %] 100 % (10/20 4765) Weight:  [70.4 kg] 70.4 kg (10/19 1654) Last BM Date: 12/15/17 Gen: awake, alert, NAD HEENT: anicteric, op clear CV: RRR, no mrg Pulm: CTA b/l Abd: soft, NT/ND, +BS throughout Ext: no c/c/e Neuro: nonfocal   Intake/Output from previous day: 10/19 0701 - 10/20 0700 In: 414 [Blood:314; IV Piggyback:100] Out: 600 [Blood:600] Intake/Output this shift: Total I/O In: 3 [I.V.:3] Out: -   Lab Results: Recent Labs    12/15/17 1855 12/16/17 0332 12/16/17 0641 12/16/17 1001  WBC 14.7* 11.2*  --  11.9*  HGB 8.0* 7.3* 7.4* 7.1*  HCT 24.8* 22.3* 22.6* 22.2*  PLT 176 171  --  176   BMET Recent Labs    12/15/17 1037 12/15/17 1049 12/16/17 0332  NA 138 139 137  K 3.2* 3.3* 5.1  CL 101 98 103  CO2 28  --  25  GLUCOSE 176* 168* 152*  BUN 28* 29* 35*  CREATININE 5.84* 6.10* 7.30*  CALCIUM 8.2*  --  8.1*   LFT Recent Labs    12/15/17 1037  PROT 5.7*  ALBUMIN 2.6*  AST 27  ALT 18  ALKPHOS 70  BILITOT 0.5   PT/INR Recent Labs    12/15/17 1037  LABPROT 15.1  INR 1.20   Hepatitis Panel No results for input(s): HEPBSAG, HCVAB, HEPAIGM, HEPBIGM in the last 72 hours.  Studies/Results: Ct Angio Abd/pel W And/or Wo Contrast  Result Date: 12/15/2017 CLINICAL DATA:  Recent diverticulitis, lower GI bleeding EXAM: CT ANGIOGRAPHY ABDOMEN AND PELVIS WITH CONTRAST AND WITHOUT CONTRAST TECHNIQUE: Multidetector CT imaging of the abdomen  and pelvis was performed using the standard protocol during bolus administration of intravenous contrast. Multiplanar reconstructed images and MIPs were obtained and reviewed to evaluate the vascular anatomy. CONTRAST:  66mL ISOVUE-370 IOPAMIDOL (ISOVUE-370) INJECTION 76% COMPARISON:  12/01/2017 FINDINGS: VASCULAR Aorta: Mild-to-moderate aortic atherosclerosis without occlusive disease, aneurysm, dissection, or acute vascular process. Celiac: Remains patent including its branches SMA: Remains patent including its branches. No evidence of active arterial bleeding or contrast extravasation within the bowel. Renals: Renal arteries remain patent. Accessory right renal artery also patent. IMA: Remains patent off the distal aorta including its branches. No active lower GI arterial bleeding appreciated. Inflow: Iliac vessels remain patent with atherosclerosis and tortuosity. No iliac inflow disease. Common, internal and external iliac arteries are patent. Proximal Outflow: Common femoral, proximal profunda femoral, proximal superficial femoral arteries are also patent. Veins: No veno-occlusive process. Review of the MIP images confirms the above findings. NON-VASCULAR Lower chest: Clear lung bases. Mild cardiac enlargement. No pericardial pleural effusion. Small hiatal hernia noted. No acute lower chest finding. Hepatobiliary: No focal liver abnormality is seen. No gallstones, gallbladder wall thickening, or biliary dilatation. Pancreas: Unremarkable. No pancreatic ductal dilatation or surrounding inflammatory changes. Spleen: Normal in size without focal abnormality. Adrenals/Urinary Tract: No acute adrenal abnormality. Stable hypodense renal cysts some with peripheral wall calcifications. No renal obstruction or hydronephrosis. No hydroureter,  ureteral calculus, or obstruction. Urinary bladder is collapsed likely accounting for wall thickening. Stomach/Bowel: Negative for bowel obstruction, significant dilatation, ileus,  or free air. Scattered colonic diverticulosis. Appendix not well visualized. No acute inflammatory process in the right lower quadrant. Previous left lower quadrant descending colon diverticulitis shows interval improvement. There is less pericolonic strandy edema/inflammation. Adjacent extraluminal air has resolved. No fluid collection or abscess has developed. No new areas of additional diverticulitis. Lymphatic: No adenopathy. Reproductive: Uterus and adnexa unremarkable. No pelvic free fluid, fluid collection, hemorrhage or hematoma. No abscess. Other: No abdominal wall hernia or abnormality. No abdominopelvic ascites. Musculoskeletal: Degenerative changes of the spine SI joints. No acute osseous finding. IMPRESSION: VASCULAR Abdominal aortoiliac atherosclerosis without occlusive disease, aneurysm, dissection, or acute vascular process. No evidence of active or acute arterial bleeding within the bowel by CTA. NON-VASCULAR Improving left lower quadrant descending colon diverticulitis as detailed above. No associated obstruction, developing abscess, ascites or free fluid Stable bilateral minimally complex renal cysts Electronically Signed   By: Jerilynn Mages.  Shick M.D.   On: 12/15/2017 12:45      Assessment & Plan  47 year old female with end-stage renal disease due to SLE, SLE on chronic prednisone, CAD with prior PCI on Plavix, hypertension, diabetes, recent treatment for descending colon diverticulitis here with lower GI bleeding/hematochezia.  1.  Diverticulitis/lower GI bleeding --hemoglobin 7.1 today, thus stable.  If drops further will need blood transfusion.  We discussed how it is uncommon for diverticulitis to be associated with diverticular bleeding but not impossible.  I recommend that we strongly consider colonoscopy while here, timing is difficult given recent diverticulitis.  I discussed the risk, benefits and alternatives with her and her family and she is agreeable to proceed but would like to wait  an additional day.  I think this is reasonable.  We will plan to continue antibiotics, and reassess tomorrow for possible colonoscopy on Tuesday --Continue IV antibiotics for recent and improving diverticulitis --Monitor for further hematochezia; transfuse to maintain hemoglobin greater than 7.0 --Clear liquid diet --Hold Plavix --Possible colonoscopy Tuesday (she had never had one previously)        LOS: 0 days   Jerene Bears  12/16/2017, 1:09 PM

## 2017-12-16 NOTE — Consult Note (Deleted)
[facility] Renal Consultation Note    Indication for Consultation:  Management of ESRD/hemodialysis, anemia, hypertension/volume, and secondary hyperparathyroidism. PCP:  HPI: Paula Bass is a 47 y.o. female with ESRD d/t SLE (on HD since 2015), HTN, Type 2 DM, and CAD (s/p stent, on plavix) who was admitted with acute LGIB.  While on HD yesterday, had episode of heamtochezia-- says "it was just pouring out." EMS called, brought to ED. When first arrived, had another episode of BRBPR (notes indicate 300-457mL volume).  In ED, labs showed Na 138, K 3.2, BUN 28, Cr 5.8, Ca 8.2, WBC 14.7, Hgb 8 (later dropped to 7.3 -> 7.1 this morning). She was given 1U PRBCs and platelet transfusion. CTA abdomen done without acute bleeding source or active diverticulitis. She denied fever or chills at home. + Dyspnea and dizziness in ED, improved with oxygen therapy. Today, she reprots that her stools are "starting to turn more brown". No CP, dyspnea or pain currently.  Hx recent admit with diverticulitis, is still finishing up course of antibiotics. LLQ abd pain had improved with abx treatment. GI consulted, plan is possibly for colonoscopy soon.  Dialyzes on TTS schedule at [facility]. Completed only 1 hour of her dialysis on Sat 10/19. AVG in her access with no recent issues.  Past Medical History:  Diagnosis Date  . CAD (coronary artery disease) 2016  . Diabetes mellitus without complication ([place])   . Dialysis patient ([place])   . Diverticulitis   . GI bleeding   . Hypertension   . Lupus [facility])    Past Surgical History:  Procedure Laterality Date  . CARDIAC SURGERY     cardiac shunt   No family history on file. Social History:  reports that she has never smoked. She has never used smokeless tobacco. She reports that she does not drink alcohol or use drugs.  ROS: As per HPI otherwise negative.  Physical Exam: Vitals:   12/16/17 0342 12/16/17 0433 12/16/17 0442 12/16/17 0642  BP:  131/71  136/81 130/84  Pulse: 82  87 84  Resp: 18  (!) 25 (!) 24  Temp:  98.2 F (36.8 C)    TempSrc:  Oral    SpO2: 96%  100% 100%  Weight:      Height:         General: Well developed, well nourished, in no acute distress. Head: Normocephalic, atraumatic, sclera non-icteric, mucus membranes are moist. Neck: Supple without lymphadenopathy/masses. JVD not elevated. Lungs: Clear bilaterally to auscultation without wheezes, rales, or rhonchi. Breathing is unlabored. Heart: Tachycardic, normal rhythm. Abdomen: Soft, non-tender, non-distended with normoactive bowel sounds. No rebound/guarding. Musculoskeletal:  Strength and tone appear normal for age. Lower extremities: No edema or ischemic changes, no open wounds. Neuro: Alert and oriented X 3. Moves all extremities spontaneously. Psych:  Responds to questions appropriately with a normal affect. Dialysis Access: LUE AVF + bruit  Allergies  Allergen Reactions  . Penicillins Hives   Prior to Admission medications   Medication Sig Start Date End Date Taking? Authorizing Provider  amLODipine (NORVASC) 10 MG tablet Take 10 mg by mouth daily. 11/23/17  Yes [provider]  aspirin EC 81 MG tablet Take 81 mg by mouth daily.   Yes [provider]  atorvastatin (LIPITOR) 40 MG tablet Take 40 mg by mouth every evening. 10/25/17  Yes [provider]  clopidogrel (PLAVIX) 75 MG tablet Take 75 mg by mouth daily. 11/28/17  Yes [provider]  insulin aspart protamine- aspart (NOVOLOG  MIX 70/30) (70-30) 100 UNIT/ML injection Inject 30-40 Units into the skin See admin instructions. 40 units in the morning, 30 units at night   Yes [provider]  lidocaine-prilocaine (EMLA) cream Apply 1 application topically daily. 11/18/17  Yes [provider]  metoprolol tartrate (LOPRESSOR) 25 MG tablet Take 25 mg by mouth 2 (two) times daily. 11/23/17  Yes [provider]  metroNIDAZOLE (FLAGYL) 500 MG  tablet Take 500 mg by mouth 3 (three) times daily.   Yes [provider]  OVER THE COUNTER MEDICATION Take 1 tablet by mouth daily. Pt takes OTC venavit tablet PO daily   Yes [provider]  predniSONE (DELTASONE) 10 MG tablet Take 10 mg by mouth daily. 11/03/17  Yes [provider]   Current Facility-Administered Medications  Medication Dose Route Frequency Provider Last Rate Last Dose  . 0.9 %  sodium chloride infusion (Manually program via Guardrails IV Fluids)   Intravenous Once Karmen Bongo, MD      . acetaminophen (TYLENOL) tablet 650 mg  650 mg Oral Q6H PRN Karmen Bongo, MD      . atorvastatin (LIPITOR) tablet 40 mg  40 mg Oral QPM Karmen Bongo, MD   40 mg at 12/15/17 1802  . calcium carbonate (dosed in mg elemental calcium) suspension 500 mg of elemental calcium  500 mg of elemental calcium Oral Q6H PRN Karmen Bongo, MD      . ciprofloxacin (CIPRO) IVPB 400 mg  400 mg Intravenous Q24H Jerene Bears, MD 200 mL/hr at 12/15/17 1925 400 mg at 12/15/17 1925  . dextrose 5 %-0.45 % sodium chloride infusion   Intravenous Continuous Thurnell Lose, MD      . docusate sodium (ENEMEEZ) enema 283 mg  1 enema Rectal PRN Karmen Bongo, MD      . feeding supplement (NEPRO CARB STEADY) liquid 237 mL  237 mL Oral TID PRN Karmen Bongo, MD      . hydrALAZINE (APRESOLINE) injection 10 mg  10 mg Intravenous Q6H PRN Thurnell Lose, MD      . insulin aspart (novoLOG) injection 0-5 Units  0-5 Units Subcutaneous QHS Karmen Bongo, MD      . insulin aspart (novoLOG) injection 0-9 Units  0-9 Units Subcutaneous TID WC Karmen Bongo, MD      . insulin glargine (LANTUS) injection 8 Units  8 Units Subcutaneous BID Thurnell Lose, MD      . metoprolol tartrate (LOPRESSOR) tablet 25 mg  25 mg Oral BID Karmen Bongo, MD   25 mg at 12/15/17 2257  . metroNIDAZOLE (FLAGYL) IVPB 500 mg  500 mg Intravenous Q8H Pyrtle, Lajuan Lines, MD 100 mL/hr at 12/16/17 0017 500 mg at  12/16/17 0017  . ondansetron (ZOFRAN) injection 4 mg  4 mg Intravenous Q6H PRN Karmen Bongo, MD      . pantoprazole (PROTONIX) EC tablet 40 mg  40 mg Oral Daily Thurnell Lose, MD      . Derrill Memo ON 12/17/2017] predniSONE (DELTASONE) tablet 20 mg  20 mg Oral Q breakfast Lala Lund K, MD      . sodium chloride flush (NS) 0.9 % injection 3 mL  3 mL Intravenous Lillia Mountain, MD   3 mL at 12/15/17 2152  . sorbitol 70 % solution 30 mL  30 mL Oral PRN Karmen Bongo, MD      . zolpidem Lorrin Mais) tablet 5 mg  5 mg Oral QHS PRN Karmen Bongo, MD  Labs: Basic Metabolic Panel: Recent Labs  Lab 12/15/17 1037 12/15/17 1049 12/16/17 0332  NA 138 139 137  K 3.2* 3.3* 5.1  CL 101 98 103  CO2 28  --  25  GLUCOSE 176* 168* 152*  BUN 28* 29* 35*  CREATININE 5.84* 6.10* 7.30*  CALCIUM 8.2*  --  8.1*   Liver Function Tests: Recent Labs  Lab 12/15/17 1037  AST 27  ALT 18  ALKPHOS 70  BILITOT 0.5  PROT 5.7*  ALBUMIN 2.6*   CBC: Recent Labs  Lab 12/15/17 1037  12/15/17 1533 12/15/17 1855 12/16/17 0332 12/16/17 0641  WBC 15.9*  --  15.5* 14.7* 11.2*  --   HGB 7.5*   < > 7.6* 8.0* 7.3* 7.4*  HCT 23.9*   < > 23.2* 24.8* 22.3* 22.6*  MCV 93.0  --  91.3 90.8 90.3  --   PLT 252  --  177 176 171  --    < > = values in this interval not displayed.   CBG: Recent Labs  Lab 12/15/17 1801 12/15/17 2137 12/16/17 0800  GLUCAP 107* 152* 148*  Studies/Results: Ct Angio Abd/pel W And/or Wo Contrast  Result Date: 12/15/2017 CLINICAL DATA:  Recent diverticulitis, lower GI bleeding EXAM: CT ANGIOGRAPHY ABDOMEN AND PELVIS WITH CONTRAST AND WITHOUT CONTRAST TECHNIQUE: Multidetector CT imaging of the abdomen and pelvis was performed using the standard protocol during bolus administration of intravenous contrast. Multiplanar reconstructed images and MIPs were obtained and reviewed to evaluate the vascular anatomy. CONTRAST:  47mL ISOVUE-370 IOPAMIDOL (ISOVUE-370) INJECTION 76%  COMPARISON:  12/01/2017 FINDINGS: VASCULAR Aorta: Mild-to-moderate aortic atherosclerosis without occlusive disease, aneurysm, dissection, or acute vascular process. Celiac: Remains patent including its branches SMA: Remains patent including its branches. No evidence of active arterial bleeding or contrast extravasation within the bowel. Renals: Renal arteries remain patent. Accessory right renal artery also patent. IMA: Remains patent off the distal aorta including its branches. No active lower GI arterial bleeding appreciated. Inflow: Iliac vessels remain patent with atherosclerosis and tortuosity. No iliac inflow disease. Common, internal and external iliac arteries are patent. Proximal Outflow: Common femoral, proximal profunda femoral, proximal superficial femoral arteries are also patent. Veins: No veno-occlusive process. Review of the MIP images confirms the above findings. NON-VASCULAR Lower chest: Clear lung bases. Mild cardiac enlargement. No pericardial pleural effusion. Small hiatal hernia noted. No acute lower chest finding. Hepatobiliary: No focal liver abnormality is seen. No gallstones, gallbladder wall thickening, or biliary dilatation. Pancreas: Unremarkable. No pancreatic ductal dilatation or surrounding inflammatory changes. Spleen: Normal in size without focal abnormality. Adrenals/Urinary Tract: No acute adrenal abnormality. Stable hypodense renal cysts some with peripheral wall calcifications. No renal obstruction or hydronephrosis. No hydroureter, ureteral calculus, or obstruction. Urinary bladder is collapsed likely accounting for wall thickening. Stomach/Bowel: Negative for bowel obstruction, significant dilatation, ileus, or free air. Scattered colonic diverticulosis. Appendix not well visualized. No acute inflammatory process in the right lower quadrant. Previous left lower quadrant descending colon diverticulitis shows interval improvement. There is less pericolonic strandy  edema/inflammation. Adjacent extraluminal air has resolved. No fluid collection or abscess has developed. No new areas of additional diverticulitis. Lymphatic: No adenopathy. Reproductive: Uterus and adnexa unremarkable. No pelvic free fluid, fluid collection, hemorrhage or hematoma. No abscess. Other: No abdominal wall hernia or abnormality. No abdominopelvic ascites. Musculoskeletal: Degenerative changes of the spine SI joints. No acute osseous finding. IMPRESSION: VASCULAR Abdominal aortoiliac atherosclerosis without occlusive disease, aneurysm, dissection, or acute vascular process. No evidence of active or acute arterial bleeding  within the bowel by CTA. NON-VASCULAR Improving left lower quadrant descending colon diverticulitis as detailed above. No associated obstruction, developing abscess, ascites or free fluid Stable bilateral minimally complex renal cysts Electronically Signed   By: Jerilynn Mages.  Shick M.D.   On: 12/15/2017 12:45    Dialysis Orders:  TTS at [facility] 3:15hr, 400/800, EEDW 69kg, 2K/2.25Ca, AVG, heparin 2000 + 2000 mid-run bolus - Hctoral 58mcg IV q HD  Assessment/Plan: 1.  Acute Lower GIB (?diverticular): Per GI. Transfuse prn. 2.  ESRD: TTS schedule. No acute needs for HD today despite not completing last session. Next due for HD on Tuesday (10/22) unless labs/volume worsen. No heparin with HD. 3.  Hypertension/volume: BP stable, no edema. 4.  Anemia (ABLA): Will dose Aranesp with next HD. 5.  Metabolic bone disease: Ca ok, Phos pending. NPO now - resume binders once allowed to eat. 6. CAD (Hx stent): Plavix on hold currently. 7. Type 2 DM 8.  SLE (on prednisone)  Veneta Penton, PA-C 12/16/2017, 9:50 AM  [facility] Pager: [phone]  Pt seen, examined and agree w A/P as above.  Kelly Splinter MD [name] pager [phone]   12/16/2017, 9:50 AM

## 2017-12-16 NOTE — Progress Notes (Signed)
CRITICAL VALUE ALERT  Critical Value:  HGB 6.7  Date & Time Notied:  1805  Provider Notified:Dr Candiss Norse  Orders Received/Actions taken: Transfusion ordered

## 2017-12-16 NOTE — Plan of Care (Signed)
  Problem: Health Behavior/Discharge Planning: Goal: Ability to manage health-related needs will improve Outcome: Progressing   Problem: Clinical Measurements: Goal: Diagnostic test results will improve Outcome: Not Progressing   Problem: Clinical Measurements: Goal: Cardiovascular complication will be avoided Outcome: Progressing

## 2017-12-16 NOTE — Progress Notes (Addendum)
PROGRESS NOTE                                                                                                                                                                                                             Patient Demographics:    Paula Bass, is a 47 y.o. female, DOB - [birth year], CBU:[id number]  Admit date - 12/15/2017   Admitting Physician Karmen Bongo, MD  Outpatient Primary MD for the patient is Patient, No Pcp Per  LOS - 0  Chief Complaint  Patient presents with  . Rectal Bleeding       Brief Narrative   Paula Bass is a 47 y.o. female with medical history significant of SLE on chronic prednisone, ESRD on HD TTS, HTN, T2DM who recently moved from [place] who presented to the ED after an episode of large-volume hematochezia.  She was at HD today and felt the need to have a BM.  She held it as long as she could and had a large bloody BM.  She was recently admitted from 12/01/2017 to 12/04/2017 for acute diverticulitis with microperforation.  She was admitted to the hospital this time for acute lower GI bleed.   Subjective:    Paula Bass today has, No headache, No chest pain, No abdominal pain - No Nausea, No new weakness tingling or numbness, No Cough - SOB.     Assessment  & Plan :      1.  Acute lower GI bleed with acute blood loss related anemia on top of anemia of chronic disease.  This seems to be acute diverticular bleed, continue to monitor H&H closely, no bowel rest and n.p.o., GI following closely.  May require transfusions if she drops below 7, type screen done.  Continue Flagyl for now.  2.  ESRD.  On Tuesday, Thursday and Saturday schedule.  Renal has been consulted.  3.  Hypertension -continue on Lopressor hold Norvasc, PRN hydralazine and monitor  4.  SLE.  On chronic prednisone.  Blood pressure stable, will continue chronic prednisone at double home dose.  Hold stress dose steroids.  5.  Dyslipidemia.  On statin  continue.  6.  DM type II.  On Lantus and sliding scale.  Monitor closely.  Lantus dose reduced with instructions to hold if sugars under 120.  Gentle D5 drip.  CBG (last 3)  Recent Labs    12/15/17 1801 12/15/17 2137 12/16/17 0800  GLUCAP 107* 152* 148*      Family Communication  :  None  Code Status :  Full  Disposition Plan  :  Med  Consults  :  GI, Renal  Procedures  :   CT -  VASCULAR Abdominal aortoiliac atherosclerosis without occlusive disease, aneurysm, dissection, or acute vascular process. No evidence of active or acute arterial bleeding within the bowel by CTA. NON-VASCULAR Improving left lower quadrant descending colon diverticulitis as detailed above. No associated obstruction, developing abscess, ascites or free fluid Stable bilateral minimally complex renal cysts.  DVT Prophylaxis  :   SCDs    Lab Results  Component Value Date   PLT 171 12/16/2017    Diet :  Diet Order            Diet NPO time specified  Diet effective now               Inpatient Medications Scheduled Meds: . sodium chloride   Intravenous Once  . amLODipine  10 mg Oral Daily  . atorvastatin  40 mg Oral QPM  . hydrocortisone sod succinate (SOLU-CORTEF) inj  50 mg Intravenous Q8H  . insulin aspart  0-5 Units Subcutaneous QHS  . insulin aspart  0-9 Units Subcutaneous TID WC  . insulin glargine  15 Units Subcutaneous BID  . metoprolol tartrate  25 mg Oral BID  . pantoprazole  40 mg Intravenous Q12H  . sodium chloride flush  3 mL Intravenous Q12H   Continuous Infusions: . ciprofloxacin 400 mg (12/15/17 1925)  . lactated ringers 25 mL/hr at 12/16/17 0654  . metronidazole 500 mg (12/16/17 0017)   PRN Meds:.acetaminophen **OR** acetaminophen, calcium carbonate (dosed in mg elemental calcium), camphor-menthol **AND** hydrOXYzine, docusate sodium, feeding supplement (NEPRO CARB STEADY), ondansetron **OR** ondansetron (ZOFRAN) IV, sorbitol, zolpidem  Antibiotics  :    Anti-infectives (From admission, onward)   Start     Dose/Rate Route Frequency Ordered Stop   12/15/17 1700  ciprofloxacin (CIPRO) IVPB 400 mg    Note to Pharmacy:  Please dose adjust for ESRD   400 mg 200 mL/hr over 60 Minutes Intravenous Every 24 hours 12/15/17 1604     12/15/17 1700  metroNIDAZOLE (FLAGYL) IVPB 500 mg     500 mg 100 mL/hr over 60 Minutes Intravenous Every 8 hours 12/15/17 1604            Objective:   Vitals:   12/16/17 0342 12/16/17 0433 12/16/17 0442 12/16/17 0642  BP: 131/71  136/81 130/84  Pulse: 82  87 84  Resp: 18  (!) 25 (!) 24  Temp:  98.2 F (36.8 C)    TempSrc:  Oral    SpO2: 96%  100% 100%  Weight:      Height:        Wt Readings from Last 3 Encounters:  12/15/17 70.4 kg  12/01/17 68 kg     Intake/Output Summary (Last 24 hours) at 12/16/2017 0933 Last data filed at 12/16/2017 0548 Gross per 24 hour  Intake 414 ml  Output 600 ml  Net -186 ml     Physical Exam  Awake Alert, Oriented X 3, No new F.N deficits, Normal affect .AT,PERRAL Supple Neck,No JVD, No cervical lymphadenopathy appriciated.  Symmetrical Chest wall movement, Good air movement bilaterally, CTAB RRR,No Gallops,Rubs or new Murmurs, No Parasternal Heave +ve B.Sounds, Abd Soft, No tenderness, No organomegaly appriciated, No rebound - guarding or rigidity. No Cyanosis, Clubbing or edema, No new Rash or bruise      Data Review:    CBC Recent Labs  Lab 12/15/17 1037 12/15/17  1049 12/15/17 1533 12/15/17 1855 12/16/17 0332 12/16/17 0641  WBC 15.9*  --  15.5* 14.7* 11.2*  --   HGB 7.5* 7.8* 7.6* 8.0* 7.3* 7.4*  HCT 23.9* 23.0* 23.2* 24.8* 22.3* 22.6*  PLT 252  --  177 176 171  --   MCV 93.0  --  91.3 90.8 90.3  --   MCH 29.2  --  29.9 29.3 29.6  --   MCHC 31.4  --  32.8 32.3 32.7  --   RDW 16.0*  --  15.3 15.4 15.9*  --     Chemistries  Recent Labs  Lab 12/15/17 1037 12/15/17 1049 12/16/17 0332  NA 138 139 137  K 3.2* 3.3* 5.1  CL 101 98 103   CO2 28  --  25  GLUCOSE 176* 168* 152*  BUN 28* 29* 35*  CREATININE 5.84* 6.10* 7.30*  CALCIUM 8.2*  --  8.1*  AST 27  --   --   ALT 18  --   --   ALKPHOS 70  --   --   BILITOT 0.5  --   --    ------------------------------------------------------------------------------------------------------------------ No results for input(s): CHOL, HDL, LDLCALC, TRIG, CHOLHDL, LDLDIRECT in the last 72 hours.  Lab Results  Component Value Date   HGBA1C 7.0 (H) 12/15/2017   ------------------------------------------------------------------------------------------------------------------ No results for input(s): TSH, T4TOTAL, T3FREE, THYROIDAB in the last 72 hours.  Invalid input(s): FREET3 ------------------------------------------------------------------------------------------------------------------ No results for input(s): VITAMINB12, FOLATE, FERRITIN, TIBC, IRON, RETICCTPCT in the last 72 hours.  Coagulation profile Recent Labs  Lab 12/15/17 1037  INR 1.20    No results for input(s): DDIMER in the last 72 hours.  Cardiac Enzymes No results for input(s): CKMB, TROPONINI, MYOGLOBIN in the last 168 hours.  Invalid input(s): CK ------------------------------------------------------------------------------------------------------------------ No results found for: BNP  Micro Results Recent Results (from the past 240 hour(s))  MRSA PCR Screening     Status: None   Collection Time: 12/15/17  5:34 PM  Result Value Ref Range Status   MRSA by PCR NEGATIVE NEGATIVE Final    Comment:        The GeneXpert MRSA Assay (FDA approved for NASAL specimens only), is one component of a comprehensive MRSA colonization surveillance program. It is not intended to diagnose MRSA infection nor to guide or monitor treatment for MRSA infections. Performed at [facility], [place] [address] [place], [place] [address]     Radiology Reports Ct Abdomen Pelvis Wo Contrast  Result Date:  12/01/2017 CLINICAL DATA:  Left lower quadrant pain for a few days. EXAM: CT ABDOMEN AND PELVIS WITHOUT CONTRAST TECHNIQUE: Multidetector CT imaging of the abdomen and pelvis was performed following the standard protocol without IV contrast. COMPARISON:  None. FINDINGS: Lower chest: No acute abnormality. Hepatobiliary: No focal liver abnormality is seen. No gallstones, gallbladder wall thickening, or biliary dilatation. Pancreas: Unremarkable. No pancreatic ductal dilatation or surrounding inflammatory changes. Spleen: Normal in size without focal abnormality. Adrenals/Urinary Tract: Adrenal glands are normal. There is a 2.8 cm mass, exophytic off the lower left kidney with peripheral rim-like calcification and a central attenuation of 12 Hounsfield units. There is a mass in the inferior pole the right kidney with a central attenuation of 13 Hounsfield units, consistent with a cyst with peripheral rim-like calcification. Other right renal cysts are noted. There appear to be a few punctate/small stones in both kidneys without hydronephrosis. Mild bilateral perinephric stranding is symmetric with no evidence of acute inflammation. The ureters and bladder are normal  in appearance. Stomach/Bowel: The stomach and small bowel are normal. There is fat stranding surrounding the junction of the distal descending and proximal sigmoid colon. There are diverticula in this region. No extraluminal fluid collection. There is extraluminal gas as seen on series 2, image 49 likely due to a micro perforation. No other free air in the abdomen. Other scattered colonic diverticuli with no other sites of diverticulitis. The remainder of the colon is unremarkable. The appendix is not seen but there is no secondary evidence of appendicitis. Vascular/Lymphatic: Atherosclerotic changes are seen in the nonaneurysmal aorta without adenopathy. Reproductive: Uterus and bilateral adnexa are unremarkable. Other: No additional abnormalities.  Musculoskeletal: No acute or significant osseous findings. IMPRESSION: 1. The abnormality at the junction of the distal descending colon and proximal sigmoid colon is most consistent with diverticulitis. A small amount of focal adjacent extraluminal air is consistent with a focal micro perforation. No abscess or pericolonic fluid collections. No other free air. Recommend follow-up to ensure complete resolution. 2. Both kidneys contain and mass which appears cystic centrally with peripheral eggshell like calcification. These are consistent with Bosniak 2 lesions requiring no follow-up. 3. Nonobstructive stones in both kidneys. 4. Atherosclerotic changes in the nonaneurysmal aorta. Electronically Signed   By: Dorise Bullion III M.D   On: 12/01/2017 22:04   Ct Angio Abd/pel W And/or Wo Contrast  Result Date: 12/15/2017 CLINICAL DATA:  Recent diverticulitis, lower GI bleeding EXAM: CT ANGIOGRAPHY ABDOMEN AND PELVIS WITH CONTRAST AND WITHOUT CONTRAST TECHNIQUE: Multidetector CT imaging of the abdomen and pelvis was performed using the standard protocol during bolus administration of intravenous contrast. Multiplanar reconstructed images and MIPs were obtained and reviewed to evaluate the vascular anatomy. CONTRAST:  61mL ISOVUE-370 IOPAMIDOL (ISOVUE-370) INJECTION 76% COMPARISON:  12/01/2017 FINDINGS: VASCULAR Aorta: Mild-to-moderate aortic atherosclerosis without occlusive disease, aneurysm, dissection, or acute vascular process. Celiac: Remains patent including its branches SMA: Remains patent including its branches. No evidence of active arterial bleeding or contrast extravasation within the bowel. Renals: Renal arteries remain patent. Accessory right renal artery also patent. IMA: Remains patent off the distal aorta including its branches. No active lower GI arterial bleeding appreciated. Inflow: Iliac vessels remain patent with atherosclerosis and tortuosity. No iliac inflow disease. Common, internal and  external iliac arteries are patent. Proximal Outflow: Common femoral, proximal profunda femoral, proximal superficial femoral arteries are also patent. Veins: No veno-occlusive process. Review of the MIP images confirms the above findings. NON-VASCULAR Lower chest: Clear lung bases. Mild cardiac enlargement. No pericardial pleural effusion. Small hiatal hernia noted. No acute lower chest finding. Hepatobiliary: No focal liver abnormality is seen. No gallstones, gallbladder wall thickening, or biliary dilatation. Pancreas: Unremarkable. No pancreatic ductal dilatation or surrounding inflammatory changes. Spleen: Normal in size without focal abnormality. Adrenals/Urinary Tract: No acute adrenal abnormality. Stable hypodense renal cysts some with peripheral wall calcifications. No renal obstruction or hydronephrosis. No hydroureter, ureteral calculus, or obstruction. Urinary bladder is collapsed likely accounting for wall thickening. Stomach/Bowel: Negative for bowel obstruction, significant dilatation, ileus, or free air. Scattered colonic diverticulosis. Appendix not well visualized. No acute inflammatory process in the right lower quadrant. Previous left lower quadrant descending colon diverticulitis shows interval improvement. There is less pericolonic strandy edema/inflammation. Adjacent extraluminal air has resolved. No fluid collection or abscess has developed. No new areas of additional diverticulitis. Lymphatic: No adenopathy. Reproductive: Uterus and adnexa unremarkable. No pelvic free fluid, fluid collection, hemorrhage or hematoma. No abscess. Other: No abdominal wall hernia or abnormality. No abdominopelvic ascites. Musculoskeletal: Degenerative changes  of the spine SI joints. No acute osseous finding. IMPRESSION: VASCULAR Abdominal aortoiliac atherosclerosis without occlusive disease, aneurysm, dissection, or acute vascular process. No evidence of active or acute arterial bleeding within the bowel by CTA.  NON-VASCULAR Improving left lower quadrant descending colon diverticulitis as detailed above. No associated obstruction, developing abscess, ascites or free fluid Stable bilateral minimally complex renal cysts Electronically Signed   By: Jerilynn Mages.  Shick M.D.   On: 12/15/2017 12:45    Time Spent in minutes  30   Lala Lund M.D on 12/16/2017 at 9:33 AM  To page go to [email or web address] - password [email or web address]

## 2017-12-16 NOTE — Progress Notes (Signed)
Patient has given oral medicine by verbal order on call md. Patient has rectal bleeding MD notified. Will continue monitor the patient.

## 2017-12-16 NOTE — Consult Note (Signed)
[facility] Renal Consultation Note    Indication for Consultation:  Management of ESRD/hemodialysis, anemia, hypertension/volume, and secondary hyperparathyroidism. PCP:  HPI: Paula Bass is a 47 y.o. female with ESRD d/t SLE (on HD since 2015), HTN, Type 2 DM, and CAD (s/p stent, on plavix) who was admitted with acute LGIB.  While on HD yesterday, had episode of heamtochezia-- says "it was just pouring out." EMS called, brought to ED. When first arrived, had another episode of BRBPR (notes indicate 300-440mL volume).  In ED, labs showed Na 138, K 3.2, BUN 28, Cr 5.8, Ca 8.2, WBC 14.7, Hgb 8 (later dropped to 7.3 -> 7.1 this morning). She was given 1U PRBCs and platelet transfusion. CTA abdomen done without acute bleeding source or active diverticulitis. She denied fever or chills at home. + Dyspnea and dizziness in ED, improved with oxygen therapy. Today, she reprots that her stools are "starting to turn more brown". No CP, dyspnea or pain currently.  Hx recent admit with diverticulitis, is still finishing up course of antibiotics. LLQ abd pain had improved with abx treatment. GI consulted, plan is possibly for colonoscopy soon.  Dialyzes on TTS schedule at [facility]. Completed only 1 hour of her dialysis on Sat 10/19. AVG in her access with no recent issues.  Past Medical History:  Diagnosis Date  . CAD (coronary artery disease) 2016  . Diabetes mellitus without complication ([place])   . Dialysis patient ([place])   . Diverticulitis   . GI bleeding   . Hypertension   . Lupus [facility])    Past Surgical History:  Procedure Laterality Date  . CARDIAC SURGERY     cardiac shunt   No family history on file. Social History:  reports that she has never smoked. She has never used smokeless tobacco. She reports that she does not drink alcohol or use drugs.  ROS: As per HPI otherwise negative.  Physical Exam: Vitals:   12/16/17 1012 12/16/17 1056 12/16/17 1057 12/16/17 1058  BP:  138/80 136/78 140/90 (!) 162/98  Pulse: 92 82 89 90  Resp: 17 17    Temp:      TempSrc:      SpO2: 98%     Weight:      Height:         General: Well developed, well nourished, in no acute distress. Head: Normocephalic, atraumatic, sclera non-icteric, mucus membranes are moist. Neck: Supple without lymphadenopathy/masses. JVD not elevated. Lungs: Clear bilaterally to auscultation without wheezes, rales, or rhonchi. Breathing is unlabored. Heart: Tachycardic, normal rhythm. Abdomen: Soft, non-tender, non-distended with normoactive bowel sounds. No rebound/guarding. Musculoskeletal:  Strength and tone appear normal for age. Lower extremities: No edema or ischemic changes, no open wounds. Neuro: Alert and oriented X 3. Moves all extremities spontaneously. Psych:  Responds to questions appropriately with a normal affect. Dialysis Access: LUE AVF + bruit  Allergies  Allergen Reactions  . Penicillins Hives   Prior to Admission medications   Medication Sig Start Date End Date Taking? Authorizing Provider  amLODipine (NORVASC) 10 MG tablet Take 10 mg by mouth daily. 11/23/17  Yes [provider]  aspirin EC 81 MG tablet Take 81 mg by mouth daily.   Yes [provider]  atorvastatin (LIPITOR) 40 MG tablet Take 40 mg by mouth every evening. 10/25/17  Yes [provider]  clopidogrel (PLAVIX) 75 MG tablet Take 75 mg by mouth daily. 11/28/17  Yes [provider]  insulin aspart protamine- aspart (NOVOLOG MIX 70/30) (70-30) 100  UNIT/ML injection Inject 30-40 Units into the skin See admin instructions. 40 units in the morning, 30 units at night   Yes [provider]  lidocaine-prilocaine (EMLA) cream Apply 1 application topically daily. 11/18/17  Yes [provider]  metoprolol tartrate (LOPRESSOR) 25 MG tablet Take 25 mg by mouth 2 (two) times daily. 11/23/17  Yes [provider]  metroNIDAZOLE (FLAGYL) 500 MG tablet Take 500 mg by mouth 3  (three) times daily.   Yes [provider]  OVER THE COUNTER MEDICATION Take 1 tablet by mouth daily. Pt takes OTC venavit tablet PO daily   Yes [provider]  predniSONE (DELTASONE) 10 MG tablet Take 10 mg by mouth daily. 11/03/17  Yes [provider]   Current Facility-Administered Medications  Medication Dose Route Frequency Provider Last Rate Last Dose  . 0.9 %  sodium chloride infusion (Manually program via Guardrails IV Fluids)   Intravenous Once Karmen Bongo, MD      . acetaminophen (TYLENOL) tablet 650 mg  650 mg Oral Q6H PRN Karmen Bongo, MD      . atorvastatin (LIPITOR) tablet 40 mg  40 mg Oral QPM Karmen Bongo, MD   40 mg at 12/15/17 1802  . calcium carbonate (dosed in mg elemental calcium) suspension 500 mg of elemental calcium  500 mg of elemental calcium Oral Q6H PRN Karmen Bongo, MD      . ciprofloxacin (CIPRO) IVPB 400 mg  400 mg Intravenous Q24H Jerene Bears, MD 200 mL/hr at 12/15/17 1925 400 mg at 12/15/17 1925  . dextrose 5 %-0.45 % sodium chloride infusion   Intravenous Continuous Thurnell Lose, MD 35 mL/hr at 12/16/17 1329    . docusate sodium (ENEMEEZ) enema 283 mg  1 enema Rectal PRN Karmen Bongo, MD      . feeding supplement (NEPRO CARB STEADY) liquid 237 mL  237 mL Oral TID PRN Karmen Bongo, MD      . hydrALAZINE (APRESOLINE) injection 10 mg  10 mg Intravenous Q6H PRN Thurnell Lose, MD      . insulin aspart (novoLOG) injection 0-5 Units  0-5 Units Subcutaneous QHS Karmen Bongo, MD      . insulin aspart (novoLOG) injection 0-9 Units  0-9 Units Subcutaneous TID WC Karmen Bongo, MD   1 Units at 12/16/17 1002  . insulin glargine (LANTUS) injection 8 Units  8 Units Subcutaneous BID Thurnell Lose, MD   8 Units at 12/16/17 1014  . metoprolol tartrate (LOPRESSOR) tablet 25 mg  25 mg Oral BID Karmen Bongo, MD   25 mg at 12/16/17 1012  . metroNIDAZOLE (FLAGYL) IVPB 500 mg  500 mg Intravenous Q8H Pyrtle, Lajuan Lines, MD 100  mL/hr at 12/16/17 1006 500 mg at 12/16/17 1006  . ondansetron (ZOFRAN) injection 4 mg  4 mg Intravenous Q6H PRN Karmen Bongo, MD      . pantoprazole (PROTONIX) EC tablet 40 mg  40 mg Oral Daily Thurnell Lose, MD   40 mg at 12/16/17 1321  . [START ON 12/17/2017] predniSONE (DELTASONE) tablet 20 mg  20 mg Oral Q breakfast Lala Lund K, MD      . sodium chloride flush (NS) 0.9 % injection 3 mL  3 mL Intravenous Q12H Karmen Bongo, MD   3 mL at 12/16/17 1015  . sorbitol 70 % solution 30 mL  30 mL Oral PRN Karmen Bongo, MD      . zolpidem [facility]) tablet 5 mg  5 mg Oral QHS PRN Lorin Mercy,  Anderson Malta, MD       Labs: Basic Metabolic Panel: Recent Labs  Lab 12/15/17 1037 12/15/17 1049 12/16/17 0332  NA 138 139 137  K 3.2* 3.3* 5.1  CL 101 98 103  CO2 28  --  25  GLUCOSE 176* 168* 152*  BUN 28* 29* 35*  CREATININE 5.84* 6.10* 7.30*  CALCIUM 8.2*  --  8.1*   Liver Function Tests: Recent Labs  Lab 12/15/17 1037  AST 27  ALT 18  ALKPHOS 70  BILITOT 0.5  PROT 5.7*  ALBUMIN 2.6*   CBC: Recent Labs  Lab 12/15/17 1037  12/15/17 1533 12/15/17 1855 12/16/17 0332 12/16/17 0641 12/16/17 1001 12/16/17 1246  WBC 15.9*  --  15.5* 14.7* 11.2*  --  11.9*  --   HGB 7.5*   < > 7.6* 8.0* 7.3* 7.4* 7.1* 7.3*  HCT 23.9*   < > 23.2* 24.8* 22.3* 22.6* 22.2* 22.1*  MCV 93.0  --  91.3 90.8 90.3  --  91.0  --   PLT 252  --  177 176 171  --  176  --    < > = values in this interval not displayed.   CBG: Recent Labs  Lab 12/15/17 1801 12/15/17 2137 12/16/17 0800 12/16/17 1214  GLUCAP 107* 152* 148* 113*  Studies/Results: Ct Angio Abd/pel W And/or Wo Contrast  Result Date: 12/15/2017 CLINICAL DATA:  Recent diverticulitis, lower GI bleeding EXAM: CT ANGIOGRAPHY ABDOMEN AND PELVIS WITH CONTRAST AND WITHOUT CONTRAST TECHNIQUE: Multidetector CT imaging of the abdomen and pelvis was performed using the standard protocol during bolus administration of intravenous contrast. Multiplanar  reconstructed images and MIPs were obtained and reviewed to evaluate the vascular anatomy. CONTRAST:  23mL ISOVUE-370 IOPAMIDOL (ISOVUE-370) INJECTION 76% COMPARISON:  12/01/2017 FINDINGS: VASCULAR Aorta: Mild-to-moderate aortic atherosclerosis without occlusive disease, aneurysm, dissection, or acute vascular process. Celiac: Remains patent including its branches SMA: Remains patent including its branches. No evidence of active arterial bleeding or contrast extravasation within the bowel. Renals: Renal arteries remain patent. Accessory right renal artery also patent. IMA: Remains patent off the distal aorta including its branches. No active lower GI arterial bleeding appreciated. Inflow: Iliac vessels remain patent with atherosclerosis and tortuosity. No iliac inflow disease. Common, internal and external iliac arteries are patent. Proximal Outflow: Common femoral, proximal profunda femoral, proximal superficial femoral arteries are also patent. Veins: No veno-occlusive process. Review of the MIP images confirms the above findings. NON-VASCULAR Lower chest: Clear lung bases. Mild cardiac enlargement. No pericardial pleural effusion. Small hiatal hernia noted. No acute lower chest finding. Hepatobiliary: No focal liver abnormality is seen. No gallstones, gallbladder wall thickening, or biliary dilatation. Pancreas: Unremarkable. No pancreatic ductal dilatation or surrounding inflammatory changes. Spleen: Normal in size without focal abnormality. Adrenals/Urinary Tract: No acute adrenal abnormality. Stable hypodense renal cysts some with peripheral wall calcifications. No renal obstruction or hydronephrosis. No hydroureter, ureteral calculus, or obstruction. Urinary bladder is collapsed likely accounting for wall thickening. Stomach/Bowel: Negative for bowel obstruction, significant dilatation, ileus, or free air. Scattered colonic diverticulosis. Appendix not well visualized. No acute inflammatory process in the  right lower quadrant. Previous left lower quadrant descending colon diverticulitis shows interval improvement. There is less pericolonic strandy edema/inflammation. Adjacent extraluminal air has resolved. No fluid collection or abscess has developed. No new areas of additional diverticulitis. Lymphatic: No adenopathy. Reproductive: Uterus and adnexa unremarkable. No pelvic free fluid, fluid collection, hemorrhage or hematoma. No abscess. Other: No abdominal wall hernia or abnormality. No abdominopelvic ascites. Musculoskeletal: Degenerative changes  of the spine SI joints. No acute osseous finding. IMPRESSION: VASCULAR Abdominal aortoiliac atherosclerosis without occlusive disease, aneurysm, dissection, or acute vascular process. No evidence of active or acute arterial bleeding within the bowel by CTA. NON-VASCULAR Improving left lower quadrant descending colon diverticulitis as detailed above. No associated obstruction, developing abscess, ascites or free fluid Stable bilateral minimally complex renal cysts Electronically Signed   By: Jerilynn Mages.  Shick M.D.   On: 12/15/2017 12:45    Dialysis Orders:  TTS at [facility] 3:15hr, 400/800, EEDW 69kg, 2K/2.25Ca, AVG, heparin 2000 + 2000 mid-run bolus - Hctoral 3mcg IV q HD  Assessment/Plan: 1.  Acute Lower GIB (?diverticular): Per GI. Transfuse prn. 2.  ESRD: TTS schedule. No acute needs for HD today despite not completing last session. Next due for HD on Tuesday (10/22) unless labs/volume worsen. No heparin with HD. 3.  Hypertension/volume: BP stable, no edema. 4.  Anemia (ABLA): Will dose Aranesp with next HD. 5.  Metabolic bone disease: Ca ok, Phos pending. NPO now - resume binders once allowed to eat. 6. CAD (Hx stent): Plavix on hold currently. 7. Type 2 DM 8.  SLE (on prednisone)  Veneta Penton, PA-C 12/16/2017, 3:11 PM  [facility] Pager: [phone]  Pt seen, examined and agree w A/P as above.  Kelly Splinter MD [employer] pager [phone]   12/16/2017, 3:12 PM

## 2017-12-17 DIAGNOSIS — K625 Hemorrhage of anus and rectum: Secondary | ICD-10-CM

## 2017-12-17 DIAGNOSIS — K5720 Diverticulitis of large intestine with perforation and abscess without bleeding: Secondary | ICD-10-CM

## 2017-12-17 LAB — RENAL FUNCTION PANEL
Albumin: 2.8 g/dL — ABNORMAL LOW (ref 3.5–5.0)
Anion gap: 11 (ref 5–15)
BUN: 47 mg/dL — ABNORMAL HIGH (ref 6–20)
CO2: 20 mmol/L — ABNORMAL LOW (ref 22–32)
Calcium: 8.5 mg/dL — ABNORMAL LOW (ref 8.9–10.3)
Chloride: 103 mmol/L (ref 98–111)
Creatinine, Ser: 9.19 mg/dL — ABNORMAL HIGH (ref 0.44–1.00)
GFR calc Af Amer: 5 mL/min — ABNORMAL LOW (ref >60)
GFR calc non Af Amer: 5 mL/min — ABNORMAL LOW (ref >60)
Glucose, Bld: 143 mg/dL — ABNORMAL HIGH (ref 70–99)
Phosphorus: 5.8 mg/dL — ABNORMAL HIGH (ref 2.5–4.6)
Potassium: 4.7 mmol/L (ref 3.5–5.1)
Sodium: 134 mmol/L — ABNORMAL LOW (ref 135–145)

## 2017-12-17 LAB — HEMOGLOBIN AND HEMATOCRIT, BLOOD
HCT: 21.8 % — ABNORMAL LOW (ref 36.0–46.0)
HCT: 29.1 % — ABNORMAL LOW (ref 36.0–46.0)
HEMATOCRIT: 25.4 % — AB (ref 36.0–46.0)
Hemoglobin: 6.7 g/dL — CL (ref 12.0–15.0)
Hemoglobin: 8.5 g/dL — ABNORMAL LOW (ref 12.0–15.0)
Hemoglobin: 9.6 g/dL — ABNORMAL LOW (ref 12.0–15.0)

## 2017-12-17 LAB — GLUCOSE, CAPILLARY
GLUCOSE-CAPILLARY: 71 mg/dL (ref 70–99)
GLUCOSE-CAPILLARY: 141 mg/dL — AB (ref 70–99)
Glucose-Capillary: 215 mg/dL — ABNORMAL HIGH (ref 70–99)
Glucose-Capillary: 131 mg/dL — ABNORMAL HIGH (ref 70–99)

## 2017-12-17 LAB — CBC
HCT: 27.7 % — ABNORMAL LOW (ref 36.0–46.0)
Hemoglobin: 8.7 g/dL — ABNORMAL LOW (ref 12.0–15.0)
MCH: 29.1 pg (ref 26.0–34.0)
MCHC: 31.4 g/dL (ref 30.0–36.0)
MCV: 92.6 fL (ref 80.0–100.0)
Platelets: 120 10*3/uL — ABNORMAL LOW (ref 150–400)
RBC: 2.99 MIL/uL — ABNORMAL LOW (ref 3.87–5.11)
RDW: 16.1 % — ABNORMAL HIGH (ref 11.5–15.5)
WBC: 12.6 10*3/uL — ABNORMAL HIGH (ref 4.0–10.5)
nRBC: 0.2 % (ref 0.0–0.2)

## 2017-12-17 MED ORDER — PEG-KCL-NACL-NASULF-NA ASC-C 100 G PO SOLR
0.5000 | Freq: Once | ORAL | Status: DC
  Filled 2017-12-17: qty 1

## 2017-12-17 MED ORDER — PEG-KCL-NACL-NASULF-NA ASC-C 100 G PO SOLR: 1.0000 | Freq: Once | ORAL | Status: DC

## 2017-12-17 MED ORDER — DEXTROSE 5 % IV SOLN
INTRAVENOUS | Status: DC
  Administered 2017-12-17: 22:00:00 via INTRAVENOUS

## 2017-12-17 MED ORDER — CHLORHEXIDINE GLUCONATE CLOTH 2 % EX PADS
6.0000 | MEDICATED_PAD | Freq: Every day | CUTANEOUS | Status: DC
  Administered 2017-12-17 – 2017-12-19 (×2): 6 via TOPICAL

## 2017-12-17 MED ORDER — LIDOCAINE HCL (PF) 1 % IJ SOLN
5.0000 mL | INTRAMUSCULAR | Status: DC | PRN
  Filled 2017-12-17: qty 5

## 2017-12-17 MED ORDER — INSULIN GLARGINE 100 UNIT/ML ~~LOC~~ SOLN
8.0000 [IU] | Freq: Every day | SUBCUTANEOUS | Status: DC
  Administered 2017-12-19: 8 [IU] via SUBCUTANEOUS
  Filled 2017-12-17 (×2): qty 0.08

## 2017-12-17 MED ORDER — PENTAFLUOROPROP-TETRAFLUOROETH EX AERO: 1.0000 "application " | INHALATION_SPRAY | CUTANEOUS | Status: DC | PRN

## 2017-12-17 MED ORDER — LIDOCAINE-PRILOCAINE 2.5-2.5 % EX CREA
1.0000 "application " | TOPICAL_CREAM | CUTANEOUS | Status: DC | PRN
  Administered 2017-12-18: 1 via TOPICAL
  Filled 2017-12-17 (×2): qty 5

## 2017-12-17 MED ORDER — SODIUM CHLORIDE 0.9 % IV SOLN: 100.0000 mL | INTRAVENOUS | Status: DC | PRN

## 2017-12-17 MED ORDER — DOXERCALCIFEROL 4 MCG/2ML IV SOLN
4.0000 ug | INTRAVENOUS | Status: DC
  Administered 2017-12-18: 4 ug via INTRAVENOUS

## 2017-12-17 MED ORDER — DARBEPOETIN ALFA 100 MCG/0.5ML IJ SOSY
100.0000 ug | PREFILLED_SYRINGE | INTRAMUSCULAR | Status: DC
  Administered 2017-12-18: 100 ug via INTRAVENOUS

## 2017-12-17 NOTE — Progress Notes (Addendum)
[facility] Progress Note   Subjective: Sitting up at bedside. C/O tenderness LLQ of abdomen. Dark stool this AM.   Objective Vitals:   12/16/17 2343 12/17/17 0200 12/17/17 0526 12/17/17 1018  BP: (!) 145/71 (!) 153/84 (!) 149/83 (!) 174/97  Pulse: 83 85 83 82  Resp: '17 17 18   ' Temp: 98.4 F (36.9 C) 98.4 F (36.9 C) 99.4 F (37.4 C)   TempSrc: Oral Oral Oral   SpO2: 96% 100% 97%   Weight:      Height:       Physical Exam General: WN,WD, NAD Heart: S1,S2, RRR  Lungs: CTAB A/P Abdomen: tenderness noted LLQ. Active BS Extremities: Trace-1+ BLE edema. Dialysis Access: L AVF + bruit.   Additional Objective Labs: Basic Metabolic Panel: Recent Labs  Lab 12/15/17 1037 12/15/17 1049 12/16/17 0332  NA 138 139 137  K 3.2* 3.3* 5.1  CL 101 98 103  CO2 28  --  25  GLUCOSE 176* 168* 152*  BUN 28* 29* 35*  CREATININE 5.84* 6.10* 7.30*  CALCIUM 8.2*  --  8.1*   Liver Function Tests: Recent Labs  Lab 12/15/17 1037  AST 27  ALT 18  ALKPHOS 70  BILITOT 0.5  PROT 5.7*  ALBUMIN 2.6*   No results for input(s): LIPASE, AMYLASE in the last 168 hours. CBC: Recent Labs  Lab 12/15/17 1037  12/15/17 1533 12/15/17 1855 12/16/17 0332  12/16/17 1001 12/16/17 1246 12/16/17 1639 12/17/17 0427  WBC 15.9*  --  15.5* 14.7* 11.2*  --  11.9*  --   --   --   HGB 7.5*   < > 7.6* 8.0* 7.3*   < > 7.1* 7.3* 6.7* 8.5*  HCT 23.9*   < > 23.2* 24.8* 22.3*   < > 22.2* 22.1* 21.8* 25.4*  MCV 93.0  --  91.3 90.8 90.3  --  91.0  --   --   --   PLT 252  --  177 176 171  --  176  --   --   --    < > = values in this interval not displayed.   Blood Culture    Component Value Date/Time   SDES  12/01/2017 1747    URINE, RANDOM Performed at [facility], [place] [address] [place], [place] [address]    SPECREQUEST  12/01/2017 1747    NONE Performed at [facility], [place] [address] [place], [place] [address]    CULT (A) 12/01/2017 1747     >=100,000 COLONIES/mL GROUP B STREP(S.AGALACTIAE)ISOLATED TESTING AGAINST S. AGALACTIAE NOT ROUTINELY PERFORMED DUE TO PREDICTABILITY OF AMP/PEN/VAN SUSCEPTIBILITY. Performed at [facility], [place] [address], [place], [place] [address]    REPTSTATUS 12/03/2017 FINAL 12/01/2017 1747    Cardiac Enzymes: No results for input(s): CKTOTAL, CKMB, CKMBINDEX, TROPONINI in the last 168 hours. CBG: Recent Labs  Lab 12/16/17 1214 12/16/17 1623 12/16/17 2123 12/16/17 2349 12/17/17 0759  GLUCAP 113* 258* 237* 204* 71   Iron Studies: No results for input(s): IRON, TIBC, TRANSFERRIN, FERRITIN in the last 72 hours. '@[email or web address]' @ Studies/Results: Ct Angio Abd/pel W And/or Wo Contrast  Result Date: 12/15/2017 CLINICAL DATA:  Recent diverticulitis, lower GI bleeding EXAM: CT ANGIOGRAPHY ABDOMEN AND PELVIS WITH CONTRAST AND WITHOUT CONTRAST TECHNIQUE: Multidetector CT imaging of the abdomen and pelvis was performed using the standard protocol during bolus administration of intravenous contrast. Multiplanar reconstructed images and MIPs were obtained and reviewed to evaluate the vascular anatomy. CONTRAST:  23m ISOVUE-370 IOPAMIDOL (ISOVUE-370) INJECTION 76% COMPARISON:  12/01/2017 FINDINGS: VASCULAR Aorta: Mild-to-moderate aortic atherosclerosis without occlusive disease, aneurysm, dissection, or acute vascular process. Celiac: Remains patent including its branches SMA: Remains patent including its branches. No evidence of active arterial bleeding or contrast extravasation within the bowel. Renals: Renal arteries remain patent. Accessory right renal artery also patent. IMA: Remains patent off the distal aorta including its branches. No active lower GI arterial bleeding appreciated. Inflow: Iliac vessels remain patent with atherosclerosis and tortuosity. No iliac inflow disease. Common, internal and external iliac arteries are patent. Proximal Outflow: Common femoral, proximal profunda femoral,  proximal superficial femoral arteries are also patent. Veins: No veno-occlusive process. Review of the MIP images confirms the above findings. NON-VASCULAR Lower chest: Clear lung bases. Mild cardiac enlargement. No pericardial pleural effusion. Small hiatal hernia noted. No acute lower chest finding. Hepatobiliary: No focal liver abnormality is seen. No gallstones, gallbladder wall thickening, or biliary dilatation. Pancreas: Unremarkable. No pancreatic ductal dilatation or surrounding inflammatory changes. Spleen: Normal in size without focal abnormality. Adrenals/Urinary Tract: No acute adrenal abnormality. Stable hypodense renal cysts some with peripheral wall calcifications. No renal obstruction or hydronephrosis. No hydroureter, ureteral calculus, or obstruction. Urinary bladder is collapsed likely accounting for wall thickening. Stomach/Bowel: Negative for bowel obstruction, significant dilatation, ileus, or free air. Scattered colonic diverticulosis. Appendix not well visualized. No acute inflammatory process in the right lower quadrant. Previous left lower quadrant descending colon diverticulitis shows interval improvement. There is less pericolonic strandy edema/inflammation. Adjacent extraluminal air has resolved. No fluid collection or abscess has developed. No new areas of additional diverticulitis. Lymphatic: No adenopathy. Reproductive: Uterus and adnexa unremarkable. No pelvic free fluid, fluid collection, hemorrhage or hematoma. No abscess. Other: No abdominal wall hernia or abnormality. No abdominopelvic ascites. Musculoskeletal: Degenerative changes of the spine SI joints. No acute osseous finding. IMPRESSION: VASCULAR Abdominal aortoiliac atherosclerosis without occlusive disease, aneurysm, dissection, or acute vascular process. No evidence of active or acute arterial bleeding within the bowel by CTA. NON-VASCULAR Improving left lower quadrant descending colon diverticulitis as detailed above. No  associated obstruction, developing abscess, ascites or free fluid Stable bilateral minimally complex renal cysts Electronically Signed   By: MJerilynn Mages  Shick M.D.   On: 12/15/2017 12:45   Medications: . ciprofloxacin 400 mg (12/16/17 1700)  . metronidazole 500 mg (12/17/17 1018)   . sodium chloride   Intravenous Once  . sodium chloride   Intravenous Once  . atorvastatin  40 mg Oral QPM  . insulin aspart  0-5 Units Subcutaneous QHS  . insulin aspart  0-9 Units Subcutaneous TID WC  . insulin glargine  8 Units Subcutaneous BID  . metoprolol tartrate  25 mg Oral BID  . pantoprazole  40 mg Oral Daily  . peg 3350 powder  0.5 kit Oral Once   And  . [START ON 12/18/2017] peg 3350 powder  0.5 kit Oral Once  . predniSONE  20 mg Oral Q breakfast  . sodium chloride flush  3 mL Intravenous Q12H     [facility] EEDW 69kg, 2K/2.25Ca, AVG, heparin 2000 units IV + 2000 units IV mid-run bolus - Hctoral 448m IV q HD  Assessment/Plan: 1.  Acute Lower GIB (?diverticular): Per GI. Plan in pt colonoscopy tomorrow. Last HGB 8.5 Transfuse prn. 2.  ESRD: T,Th,S. HD in AM, try to coordinate with GI as pt is scheduled for colonoscopy. No Heparin. K+ 5.1 12/16/17. RFP ordered for today.  3.  Hypertension/volume: BP stable,  no edema. Last HD 10/19 only stayed 54 minutes. Left at OP EDW. Has BLE edema. 4.  Anemia (ABLA): HGB 8.5 S/P 2 units PRBCs Has not required ESA as OP. Aranesp 100 mcg IV with HD tomorrow. Follow HGB and transfuse PRN.  5.  Metabolic bone disease: Ca 8.1.  Clear liquids now - resume binders once allowed to eat. 6. CAD (Hx stent): Plavix on hold currently. 7. Type 2 DM 8.  SLE (on prednisone)  Rita H. Brown NP-C 12/17/2017, 11:12 AM  [name] [phone]  I have seen and examined this patient and agree with plan and assessment in the above note with renal recommendations/intervention highlighted.  Unfortunately her colonoscopy is  scheduled for tomorrow, so will work around her procedure.   Broadus John A Keyler Hoge,MD 12/17/2017 1:52 PM

## 2017-12-17 NOTE — Progress Notes (Signed)
[facility] Progress Note   Chief Complaint:   Diverticulitis and lower GI bleeding    SUBJECTIVE:    Mild LLQ pain again this again but thinks it is because she has been moving around. Bleeding has significantly slowed   ASSESSMENT AND PLAN:   1. Diverticulitis and lower GI bleeding on plavix. CTA abd/pelvis negative for active bleeding, improving diverticulitis.  Perplexing, diverticulitis not typically associated with bleeding.  We have recommended inpatient colonoscopy -Bleeding seems to be resolving -Still some mild LLQ discomfort. Continue cipro / flagyl -Plavix is on hold -Will plan for colonoscopy tomorrow. The risks and benefits of colonoscopy with possible polypectomy were discussed and the patient agrees to proceed.   2.  Acute on chronic anemia .  Hemoglobin 7.5 on admission , she received 1 units of blood.  Hemoglobin actually declined further to 6.7 so she got another unit of blood yesterday . Hgb today is 8.5   3. ESRD, on HD  ESR. Annemia  OBJECTIVE:     Vital signs in last 24 hours: Temp:  [98.1 F (36.7 C)-99.4 F (37.4 C)] 99.4 F (37.4 C) (10/21 0526) Pulse Rate:  [82-93] 82 (10/21 1018) Resp:  [17-18] 18 (10/21 0526) BP: (136-174)/(71-98) 174/97 (10/21 1018) SpO2:  [96 %-100 %] 97 % (10/21 0526) Last BM Date: 12/16/17 General:   Alert, well-developed,  in NAD EENT:  Normal hearing, non icteric sclera, conjunctive pink.  Heart:  Regular rate and rhythm; no murmurs. No lower extremity edema Pulm: Normal respiratory effort, lungs CTA bilaterally without wheezes or crackles. Abdomen:  Soft, nondistended, minimal LLQ tendernss.  Normal bowel sounds, no masses felt.     Neurologic:  Alert and  oriented x4;  grossly normal neurologically. Psych:  Pleasant, cooperative.  Normal mood and affect.   Intake/Output from previous day: 10/20 0701 - 10/21 0700 In: 657 [P.O.:120; I.V.:3; Blood:334; IV Piggyback:200] Out: -  Intake/Output  this shift: No intake/output data recorded.  Lab Results: Recent Labs    12/15/17 1855 12/16/17 0332  12/16/17 1001 12/16/17 1246 12/16/17 1639 12/17/17 0427  WBC 14.7* 11.2*  --  11.9*  --   --   --   HGB 8.0* 7.3*   < > 7.1* 7.3* 6.7* 8.5*  HCT 24.8* 22.3*   < > 22.2* 22.1* 21.8* 25.4*  PLT 176 171  --  176  --   --   --    < > = values in this interval not displayed.   BMET Recent Labs    12/15/17 1037 12/15/17 1049 12/16/17 0332  NA 138 139 137  K 3.2* 3.3* 5.1  CL 101 98 103  CO2 28  --  25  GLUCOSE 176* 168* 152*  BUN 28* 29* 35*  CREATININE 5.84* 6.10* 7.30*  CALCIUM 8.2*  --  8.1*   LFT Recent Labs    12/15/17 1037  PROT 5.7*  ALBUMIN 2.6*  AST 27  ALT 18  ALKPHOS 70  BILITOT 0.5   PT/INR Recent Labs    12/15/17 1037  LABPROT 15.1  INR 1.20   Hepatitis Panel No results for input(s): HEPBSAG, HCVAB, HEPAIGM, HEPBIGM in the last 72 hours.  Ct Angio Abd/pel W And/or Wo Contrast  Result Date: 12/15/2017 CLINICAL DATA:  Recent diverticulitis, lower GI bleeding EXAM: CT ANGIOGRAPHY ABDOMEN AND PELVIS WITH CONTRAST AND WITHOUT CONTRAST TECHNIQUE: Multidetector CT imaging of the abdomen and pelvis was performed using the standard protocol during bolus administration of intravenous contrast. Multiplanar  reconstructed images and MIPs were obtained and reviewed to evaluate the vascular anatomy. CONTRAST:  27m ISOVUE-370 IOPAMIDOL (ISOVUE-370) INJECTION 76% COMPARISON:  12/01/2017 FINDINGS: VASCULAR Aorta: Mild-to-moderate aortic atherosclerosis without occlusive disease, aneurysm, dissection, or acute vascular process. Celiac: Remains patent including its branches SMA: Remains patent including its branches. No evidence of active arterial bleeding or contrast extravasation within the bowel. Renals: Renal arteries remain patent. Accessory right renal artery also patent. IMA: Remains patent off the distal aorta including its branches. No active lower GI arterial  bleeding appreciated. Inflow: Iliac vessels remain patent with atherosclerosis and tortuosity. No iliac inflow disease. Common, internal and external iliac arteries are patent. Proximal Outflow: Common femoral, proximal profunda femoral, proximal superficial femoral arteries are also patent. Veins: No veno-occlusive process. Review of the MIP images confirms the above findings. NON-VASCULAR Lower chest: Clear lung bases. Mild cardiac enlargement. No pericardial pleural effusion. Small hiatal hernia noted. No acute lower chest finding. Hepatobiliary: No focal liver abnormality is seen. No gallstones, gallbladder wall thickening, or biliary dilatation. Pancreas: Unremarkable. No pancreatic ductal dilatation or surrounding inflammatory changes. Spleen: Normal in size without focal abnormality. Adrenals/Urinary Tract: No acute adrenal abnormality. Stable hypodense renal cysts some with peripheral wall calcifications. No renal obstruction or hydronephrosis. No hydroureter, ureteral calculus, or obstruction. Urinary bladder is collapsed likely accounting for wall thickening. Stomach/Bowel: Negative for bowel obstruction, significant dilatation, ileus, or free air. Scattered colonic diverticulosis. Appendix not well visualized. No acute inflammatory process in the right lower quadrant. Previous left lower quadrant descending colon diverticulitis shows interval improvement. There is less pericolonic strandy edema/inflammation. Adjacent extraluminal air has resolved. No fluid collection or abscess has developed. No new areas of additional diverticulitis. Lymphatic: No adenopathy. Reproductive: Uterus and adnexa unremarkable. No pelvic free fluid, fluid collection, hemorrhage or hematoma. No abscess. Other: No abdominal wall hernia or abnormality. No abdominopelvic ascites. Musculoskeletal: Degenerative changes of the spine SI joints. No acute osseous finding. IMPRESSION: VASCULAR Abdominal aortoiliac atherosclerosis without  occlusive disease, aneurysm, dissection, or acute vascular process. No evidence of active or acute arterial bleeding within the bowel by CTA. NON-VASCULAR Improving left lower quadrant descending colon diverticulitis as detailed above. No associated obstruction, developing abscess, ascites or free fluid Stable bilateral minimally complex renal cysts Electronically Signed   By: MJerilynn Mages  Shick M.D.   On: 12/15/2017 12:45   Principal Problem:   Hematochezia Active Problems:   ESRD (end stage renal disease) ([facility]   DM2 (diabetes mellitus, type 2) (HCC)   Lupus (HCC)   Benign essential HTN   LGI bleed   Blood loss anemia   Diverticulitis of colon     LOS: 1 day   PTye Savoy,NP 12/17/2017, 10:41 AM

## 2017-12-17 NOTE — Progress Notes (Addendum)
PROGRESS NOTE                                                                                                                                                                                                             Patient Demographics:    Paula Bass, is a 47 y.o. female, DOB - 03-01-1970, MBW:[id number]  Admit date - 12/15/2017   Admitting Physician Karmen Bongo, MD  Outpatient Primary MD for the patient is Patient, No Pcp Per  LOS - 1  Chief Complaint  Patient presents with  . Rectal Bleeding       Brief Narrative   Paula Bass is a 47 y.o. female with medical history significant of SLE on chronic prednisone, ESRD on HD TTS, HTN, T2DM who recently moved from [place] who presented to the ED after an episode of large-volume hematochezia.  She was at HD today and felt the need to have a BM.  She held it as long as she could and had a large bloody BM.  She was recently admitted from 12/01/2017 to 12/04/2017 for acute diverticulitis with microperforation.  She was admitted to the hospital this time for acute lower GI bleed.   Subjective:   Patient in bed, appears comfortable, denies any headache, no fever, no chest pain or pressure, no shortness of breath , no abdominal pain. No focal weakness.  No further bleeding had a small bowel movement last night with maybe 10 cc of blood in it.   Assessment  & Plan :      1.  Acute lower GI bleed with acute blood loss related anemia on top of anemia of chronic disease.  This again seems to be consistent with acute lower GI bleed which is diverticular in nature, she has received 2 units of packed RBC so far last on 12/16/2017, continue to monitor H&H, continue Cipro Flagyl due to severe penicillin allergy, GI on board likely to undergo colonoscopy on 12/18/2017 per GI.  2.  ESRD.  On Tuesday, Thursday and Saturday schedule.  Renal has been consulted.  3.  Hypertension -continue on Lopressor hold Norvasc, PRN hydralazine  and monitor  4.  SLE.  On chronic prednisone.  Blood pressure stable, will continue chronic prednisone at double home dose.  Hold stress dose steroids.  5.  Dyslipidemia.  On statin continue.  6.  DM type II.  Since oral diet is inconsistent due to pending colonoscopy Lantus dose further reduced to avoid hypoglycemic episodes, gentle D5 drip from  tonight when she is n.p.o. again.  CBG (last 3)  Recent Labs    12/16/17 2123 12/16/17 2349 12/17/17 0759  GLUCAP 237* 204* 14      Family Communication  :  None  Code Status :  Full  Disposition Plan  :  Med  Consults  :  GI, Renal  Procedures  :   CT -  VASCULAR Abdominal aortoiliac atherosclerosis without occlusive disease, aneurysm, dissection, or acute vascular process. No evidence of active or acute arterial bleeding within the bowel by CTA. NON-VASCULAR Improving left lower quadrant descending colon diverticulitis as detailed above. No associated obstruction, developing abscess, ascites or free fluid Stable bilateral minimally complex renal cysts.  DVT Prophylaxis  :   SCDs    Lab Results  Component Value Date   PLT 176 12/16/2017    Diet :  Diet Order            Diet NPO time specified  Diet effective midnight        Diet clear liquid Room service appropriate? Yes; Fluid consistency: Thin  Diet effective now               Inpatient Medications Scheduled Meds: . sodium chloride   Intravenous Once  . atorvastatin  40 mg Oral QPM  . insulin aspart  0-5 Units Subcutaneous QHS  . insulin aspart  0-9 Units Subcutaneous TID WC  . insulin glargine  8 Units Subcutaneous BID  . metoprolol tartrate  25 mg Oral BID  . pantoprazole  40 mg Oral Daily  . peg 3350 powder  0.5 kit Oral Once   And  . [START ON 12/18/2017] peg 3350 powder  0.5 kit Oral Once  . predniSONE  20 mg Oral Q breakfast  . sodium chloride flush  3 mL Intravenous Q12H   Continuous Infusions: . ciprofloxacin 400 mg (12/16/17 1700)  .  metronidazole 500 mg (12/17/17 1018)   PRN Meds:.acetaminophen **OR** [DISCONTINUED] acetaminophen, calcium carbonate (dosed in mg elemental calcium), docusate sodium, feeding supplement (NEPRO CARB STEADY), hydrALAZINE, [DISCONTINUED] ondansetron **OR** ondansetron (ZOFRAN) IV, sorbitol, zolpidem  Antibiotics  :   Anti-infectives (From admission, onward)   Start     Dose/Rate Route Frequency Ordered Stop   12/15/17 1700  ciprofloxacin (CIPRO) IVPB 400 mg    Note to Pharmacy:  Please dose adjust for ESRD   400 mg 200 mL/hr over 60 Minutes Intravenous Every 24 hours 12/15/17 1604     12/15/17 1700  metroNIDAZOLE (FLAGYL) IVPB 500 mg     500 mg 100 mL/hr over 60 Minutes Intravenous Every 8 hours 12/15/17 1604            Objective:   Vitals:   12/16/17 2343 12/17/17 0200 12/17/17 0526 12/17/17 1018  BP: (!) 145/71 (!) 153/84 (!) 149/83 (!) 174/97  Pulse: 83 85 83 82  Resp: _0 Temp: 98.4 F (36.9 C) 98.4 F (36.9 C) 99.4 F (37.4 C)   TempSrc: Oral Oral Oral   SpO2: 96% 100% 97%   Weight:      Height:        Wt Readings from Last 3 Encounters:  12/15/17 70.4 kg  12/01/17 68 kg     Intake/Output Summary (Last 24 hours) at 12/17/2017 1117 Last data filed at 12/17/2017 0222 Gross per 24 hour  Intake 654 ml  Output -  Net 654 ml     Physical Exam  Awake Alert, Oriented X 3, No new F.N deficits,  Normal affect Orient.AT,PERRAL Supple Neck,No JVD, No cervical lymphadenopathy appriciated.  Symmetrical Chest wall movement, Good air movement bilaterally, CTAB RRR,No Gallops, Rubs or new Murmurs, No Parasternal Heave +ve B.Sounds, Abd Soft, left lower quadrant tenderness, No organomegaly appriciated, No rebound - guarding or rigidity. No Cyanosis, Clubbing or edema, No new Rash or bruise      Data Review:    CBC Recent Labs  Lab 12/15/17 1037  12/15/17 1533 12/15/17 1855 12/16/17 0332 12/16/17 0641 12/16/17 1001 12/16/17 1246 12/16/17 1639  12/17/17 0427  WBC 15.9*  --  15.5* 14.7* 11.2*  --  11.9*  --   --   --   HGB 7.5*   < > 7.6* 8.0* 7.3* 7.4* 7.1* 7.3* 6.7* 8.5*  HCT 23.9*   < > 23.2* 24.8* 22.3* 22.6* 22.2* 22.1* 21.8* 25.4*  PLT 252  --  177 176 171  --  176  --   --   --   MCV 93.0  --  91.3 90.8 90.3  --  91.0  --   --   --   MCH 29.2  --  29.9 29.3 29.6  --  29.1  --   --   --   MCHC 31.4  --  32.8 32.3 32.7  --  32.0  --   --   --   RDW 16.0*  --  15.3 15.4 15.9*  --  15.9*  --   --   --    < > = values in this interval not displayed.    Chemistries  Recent Labs  Lab 12/15/17 1037 12/15/17 1049 12/16/17 0332  NA 138 139 137  K 3.2* 3.3* 5.1  CL 101 98 103  CO2 28  --  25  GLUCOSE 176* 168* 152*  BUN 28* 29* 35*  CREATININE 5.84* 6.10* 7.30*  CALCIUM 8.2*  --  8.1*  AST 27  --   --   ALT 18  --   --   ALKPHOS 70  --   --   BILITOT 0.5  --   --    ------------------------------------------------------------------------------------------------------------------ No results for input(s): CHOL, HDL, LDLCALC, TRIG, CHOLHDL, LDLDIRECT in the last 72 hours.  Lab Results  Component Value Date   HGBA1C 7.0 (H) 12/15/2017   ------------------------------------------------------------------------------------------------------------------ No results for input(s): TSH, T4TOTAL, T3FREE, THYROIDAB in the last 72 hours.  Invalid input(s): FREET3 ------------------------------------------------------------------------------------------------------------------ No results for input(s): VITAMINB12, FOLATE, FERRITIN, TIBC, IRON, RETICCTPCT in the last 72 hours.  Coagulation profile Recent Labs  Lab 12/15/17 1037  INR 1.20    No results for input(s): DDIMER in the last 72 hours.  Cardiac Enzymes No results for input(s): CKMB, TROPONINI, MYOGLOBIN in the last 168 hours.  Invalid input(s):  CK ------------------------------------------------------------------------------------------------------------------ No results found for: BNP  Micro Results Recent Results (from the past 240 hour(s))  MRSA PCR Screening     Status: None   Collection Time: 12/15/17  5:34 PM  Result Value Ref Range Status   MRSA by PCR NEGATIVE NEGATIVE Final    Comment:        The GeneXpert MRSA Assay (FDA approved for NASAL specimens only), is one component of a comprehensive MRSA colonization surveillance program. It is not intended to diagnose MRSA infection nor to guide or monitor treatment for MRSA infections. Performed at [facility], [place] [address], [place],  [address]     Radiology Reports Ct Abdomen Pelvis Wo Contrast  Result Date: 12/01/2017 CLINICAL DATA:  Left lower  quadrant pain for a few days. EXAM: CT ABDOMEN AND PELVIS WITHOUT CONTRAST TECHNIQUE: Multidetector CT imaging of the abdomen and pelvis was performed following the standard protocol without IV contrast. COMPARISON:  None. FINDINGS: Lower chest: No acute abnormality. Hepatobiliary: No focal liver abnormality is seen. No gallstones, gallbladder wall thickening, or biliary dilatation. Pancreas: Unremarkable. No pancreatic ductal dilatation or surrounding inflammatory changes. Spleen: Normal in size without focal abnormality. Adrenals/Urinary Tract: Adrenal glands are normal. There is a 2.8 cm mass, exophytic off the lower left kidney with peripheral rim-like calcification and a central attenuation of 12 Hounsfield units. There is a mass in the inferior pole the right kidney with a central attenuation of 13 Hounsfield units, consistent with a cyst with peripheral rim-like calcification. Other right renal cysts are noted. There appear to be a few punctate/small stones in both kidneys without hydronephrosis. Mild bilateral perinephric stranding is symmetric with no evidence of acute inflammation. The ureters and bladder  are normal in appearance. Stomach/Bowel: The stomach and small bowel are normal. There is fat stranding surrounding the junction of the distal descending and proximal sigmoid colon. There are diverticula in this region. No extraluminal fluid collection. There is extraluminal gas as seen on series 2, image 49 likely due to a micro perforation. No other free air in the abdomen. Other scattered colonic diverticuli with no other sites of diverticulitis. The remainder of the colon is unremarkable. The appendix is not seen but there is no secondary evidence of appendicitis. Vascular/Lymphatic: Atherosclerotic changes are seen in the nonaneurysmal aorta without adenopathy. Reproductive: Uterus and bilateral adnexa are unremarkable. Other: No additional abnormalities. Musculoskeletal: No acute or significant osseous findings. IMPRESSION: 1. The abnormality at the junction of the distal descending colon and proximal sigmoid colon is most consistent with diverticulitis. A small amount of focal adjacent extraluminal air is consistent with a focal micro perforation. No abscess or pericolonic fluid collections. No other free air. Recommend follow-up to ensure complete resolution. 2. Both kidneys contain and mass which appears cystic centrally with peripheral eggshell like calcification. These are consistent with Bosniak 2 lesions requiring no follow-up. 3. Nonobstructive stones in both kidneys. 4. Atherosclerotic changes in the nonaneurysmal aorta. Electronically Signed   By: Dorise Bullion III M.D   On: 12/01/2017 22:04   Ct Angio Abd/pel W And/or Wo Contrast  Result Date: 12/15/2017 CLINICAL DATA:  Recent diverticulitis, lower GI bleeding EXAM: CT ANGIOGRAPHY ABDOMEN AND PELVIS WITH CONTRAST AND WITHOUT CONTRAST TECHNIQUE: Multidetector CT imaging of the abdomen and pelvis was performed using the standard protocol during bolus administration of intravenous contrast. Multiplanar reconstructed images and MIPs were obtained  and reviewed to evaluate the vascular anatomy. CONTRAST:  70m ISOVUE-370 IOPAMIDOL (ISOVUE-370) INJECTION 76% COMPARISON:  12/01/2017 FINDINGS: VASCULAR Aorta: Mild-to-moderate aortic atherosclerosis without occlusive disease, aneurysm, dissection, or acute vascular process. Celiac: Remains patent including its branches SMA: Remains patent including its branches. No evidence of active arterial bleeding or contrast extravasation within the bowel. Renals: Renal arteries remain patent. Accessory right renal artery also patent. IMA: Remains patent off the distal aorta including its branches. No active lower GI arterial bleeding appreciated. Inflow: Iliac vessels remain patent with atherosclerosis and tortuosity. No iliac inflow disease. Common, internal and external iliac arteries are patent. Proximal Outflow: Common femoral, proximal profunda femoral, proximal superficial femoral arteries are also patent. Veins: No veno-occlusive process. Review of the MIP images confirms the above findings. NON-VASCULAR Lower chest: Clear lung bases. Mild cardiac enlargement. No pericardial pleural effusion. Small hiatal hernia  noted. No acute lower chest finding. Hepatobiliary: No focal liver abnormality is seen. No gallstones, gallbladder wall thickening, or biliary dilatation. Pancreas: Unremarkable. No pancreatic ductal dilatation or surrounding inflammatory changes. Spleen: Normal in size without focal abnormality. Adrenals/Urinary Tract: No acute adrenal abnormality. Stable hypodense renal cysts some with peripheral wall calcifications. No renal obstruction or hydronephrosis. No hydroureter, ureteral calculus, or obstruction. Urinary bladder is collapsed likely accounting for wall thickening. Stomach/Bowel: Negative for bowel obstruction, significant dilatation, ileus, or free air. Scattered colonic diverticulosis. Appendix not well visualized. No acute inflammatory process in the right lower quadrant. Previous left lower  quadrant descending colon diverticulitis shows interval improvement. There is less pericolonic strandy edema/inflammation. Adjacent extraluminal air has resolved. No fluid collection or abscess has developed. No new areas of additional diverticulitis. Lymphatic: No adenopathy. Reproductive: Uterus and adnexa unremarkable. No pelvic free fluid, fluid collection, hemorrhage or hematoma. No abscess. Other: No abdominal wall hernia or abnormality. No abdominopelvic ascites. Musculoskeletal: Degenerative changes of the spine SI joints. No acute osseous finding. IMPRESSION: VASCULAR Abdominal aortoiliac atherosclerosis without occlusive disease, aneurysm, dissection, or acute vascular process. No evidence of active or acute arterial bleeding within the bowel by CTA. NON-VASCULAR Improving left lower quadrant descending colon diverticulitis as detailed above. No associated obstruction, developing abscess, ascites or free fluid Stable bilateral minimally complex renal cysts Electronically Signed   By: Jerilynn Mages.  Shick M.D.   On: 12/15/2017 12:45    Time Spent in minutes  30   Lala Lund M.D on 12/17/2017 at 11:17 AM  To page go to [email or web address] - password [email or web address]

## 2017-12-18 ENCOUNTER — Encounter (HOSPITAL_COMMUNITY)
Admission: EM | Disposition: A | Discharge status: Home / Self Care | Payer: Self-pay | Source: Home / Self Care | Attending: Internal Medicine

## 2017-12-18 LAB — HEMOGLOBIN AND HEMATOCRIT, BLOOD
HCT: 26.4 % — ABNORMAL LOW (ref 36.0–46.0)
HCT: 31 % — ABNORMAL LOW (ref 36.0–46.0)
HEMOGLOBIN: 10.4 g/dL — AB (ref 12.0–15.0)
Hemoglobin: 8.5 g/dL — ABNORMAL LOW (ref 12.0–15.0)

## 2017-12-18 LAB — GLUCOSE, CAPILLARY
GLUCOSE-CAPILLARY: 73 mg/dL (ref 70–99)
GLUCOSE-CAPILLARY: 241 mg/dL — AB (ref 70–99)
Glucose-Capillary: 100 mg/dL — ABNORMAL HIGH (ref 70–99)
Glucose-Capillary: 63 mg/dL — ABNORMAL LOW (ref 70–99)
Glucose-Capillary: 165 mg/dL — ABNORMAL HIGH (ref 70–99)

## 2017-12-18 SURGERY — COLONOSCOPY
Anesthesia: Moderate Sedation

## 2017-12-18 MED ORDER — DOXERCALCIFEROL 4 MCG/2ML IV SOLN
INTRAVENOUS | Status: AC
  Administered 2017-12-18: 4 ug via INTRAVENOUS
  Filled 2017-12-18: qty 2

## 2017-12-18 MED ORDER — DARBEPOETIN ALFA 100 MCG/0.5ML IJ SOSY
PREFILLED_SYRINGE | INTRAMUSCULAR | Status: AC
  Administered 2017-12-18: 100 ug via INTRAVENOUS
  Filled 2017-12-18: qty 0.5

## 2017-12-18 NOTE — Op Note (Deleted)
[facility] Patient Name: Paula Bass Procedure Date : 12/18/2017 MRN: [id number] Attending MD: Justice Britain , MD Date of Birth: [birth year] CSN: [id number] Age: 47 Admit Type: Inpatient Procedure:                Colonoscopy Indications:              Hematochezia, Acute post hemorrhagic anemia,                            Reported history of prior diverticulosis but no                            recent colonoscopy or imaging to document this Providers:                Justice Britain, MD, Baird [facility], RN, Elspeth Cho Tech., Technician Referring MD:              Medicines:                Monitored Anesthesia Care Complications:            No immediate complications. Estimated Blood Loss:     Estimated blood loss was minimal. Procedure:                Pre-Anesthesia Assessment:                           - Prior to the procedure, a History and Physical                            was performed, and patient medications and                            allergies were reviewed. The patient's tolerance of                            previous anesthesia was also reviewed. The risks                            and benefits of the procedure and the sedation                            options and risks were discussed with the patient.                            All questions were answered, and informed consent                            was obtained. Prior Anticoagulants: The patient has                            taken no previous anticoagulant or antiplatelet  agents. ASA Grade Assessment: III - A patient with                            severe systemic disease. After reviewing the risks                            and benefits, the patient was deemed in                            satisfactory condition to undergo the procedure.                           After obtaining informed consent, the colonoscope              was passed under direct vision. Throughout the                            procedure, the patient's blood pressure, pulse, and                            oxygen saturations were monitored continuously. The                            PCF-[id number] ([id number] peds colon was introduced                            through the anus and advanced to the 5 cm into the                            ileum. The colonoscopy was performed without                            difficulty. The patient tolerated the procedure.                            The quality of the bowel preparation was evaluated                            using the BBPS [facility] Bowel Preparation Scale)                            with scores of: Right Colon = 2 (minor amount of                            residual staining, small fragments of stool and/or                            opaque liquid, but mucosa seen well), Transverse                            Colon = 3 (entire mucosa seen well with no residual  staining, small fragments of stool or opaque                            liquid) and Left Colon = 2 (minor amount of                            residual staining, small fragments of stool and/or                            opaque liquid, but mucosa seen well). The total                            BBPS score equals 7. The quality of the bowel                            preparation was fair. Scope In: 9:59:36 AM Scope Out: 10:30:13 AM Scope Withdrawal Time: 0 hours 27 minutes 9 seconds  Total Procedure Duration: 0 hours 30 minutes 37 seconds  Findings:      The digital rectal exam findings include non-thrombosed external       hemorrhoids and non-thrombosed internal hemorrhoids.      The terminal ileum and ileocecal valve appeared normal.      Nine sessile polyps were found in the recto-sigmoid colon (2), sigmoid       colon (1), descending colon (1), splenic flexure (1), transverse colon       (1),  ascending colon (2) and cecum (1). The polyps were 2 to 5 mm in       size. These polyps were removed with a cold snare. Resection and       retrieval were complete.      A 8 mm polyp was found in the ascending colon. The polyp was sessile.       The polyp was removed with a hot snare. Resection and retrieval were       complete.      A 6 mm polyp was found in the rectum. The polyp was sessile. The polyp       was removed with a hot snare. Resection and retrieval were complete.      Small and large-mouthed diverticula were found in the recto-sigmoid       colon, sigmoid colon, descending colon and transverse colon       (predominance however in the left colon).      Non-bleeding non-thrombosed external and internal hemorrhoids were found       during retroflexion, during perianal exam and during digital exam. The       hemorrhoids were Grade II (internal hemorrhoids that prolapse but reduce       spontaneously). Impression:               - Preparation of the colon was fair.                           - Non-thrombosed external hemorrhoids and                            non-thrombosed internal hemorrhoids found on  digital rectal exam.                           - The examined portion of the ileum was normal.                           - Nine 2 to 5 mm polyps at the recto-sigmoid colon,                            in the sigmoid colon, in the descending colon, at                            the splenic flexure, in the transverse colon, in                            the ascending colon and in the cecum, removed with                            a cold snare. Resected and retrieved.                           - One 8 mm polyp in the ascending colon, removed                            with a hot snare. Resected and retrieved.                           - One 6 mm polyp in the rectum, removed with a hot                            snare. Resected and retrieved.                            - Diverticulosis in the recto-sigmoid colon, in the                            sigmoid colon and in the descending colon.                           - Non-bleeding non-thrombosed external and internal                            hemorrhoids. Recommendation:           - The patient will be observed post-procedure,                            until all discharge criteria are met.                           - Return patient to hospital ward for ongoing care.                           - Continue present medications.                           -  Await pathology results.                           - High Fiber Diet is recommended.                           - Colace 1-2 times daily.                           - Fiber supplementation 1-2 times daily.                           - Miralax QOD or QD to maintain stool consistency                            and not allow aggravation of hermorrhoids.                           - Hemorrhoidal cream/suppositories if significant                            discomfort.                           - Hemorrhoidal burden is much higher for external                            hemorrhoids and tags that a colorectal intervention                            would have to be considered more than internal                            banding.                           - Etiology of significant bleeding likely                            diverticular in origin, but high-grade hemorrhoidal                            bleeding is possible but less likely.                           - No recommendation at this time regarding repeat                            colonoscopy due to age.                           - The findings and recommendations were discussed                            with the patient.                           -  The findings and recommendations were discussed                            with the patient's family. Procedure Code(s):        --- Professional ---                            [phone], Colonoscopy, flexible; with removal of                            tumor(s), polyp(s), or other lesion(s) by snare                            technique Diagnosis Code(s):        --- Professional ---                           K64.1, Second degree hemorrhoids                           K64.4, Residual hemorrhoidal skin tags                           D12.7, Benign neoplasm of rectosigmoid junction                           D12.5, Benign neoplasm of sigmoid colon                           D12.4, Benign neoplasm of descending colon                           D12.3, Benign neoplasm of transverse colon (hepatic                            flexure or splenic flexure)                           D12.2, Benign neoplasm of ascending colon                           D12.0, Benign neoplasm of cecum                           K62.1, Rectal polyp                           K92.1, Melena (includes Hematochezia)                           D62, Acute posthemorrhagic anemia                           K57.30, Diverticulosis of large intestine without                            perforation or abscess without bleeding CPT copyright 2018 [employer]. All  rights reserved. The codes documented in this report are preliminary and upon coder review may  be revised to meet current compliance requirements. Justice Britain, MD 12/18/2017 10:50:36 AM Number of Addenda: 0

## 2017-12-18 NOTE — Progress Notes (Signed)
PROGRESS NOTE                                                                                                                                                                                                             Patient Demographics:    Paula Bass, is a 47 y.o. female, DOB - 1970/08/27, PIR:[id number]  Admit date - 12/15/2017   Admitting Physician Karmen Bongo, MD  Outpatient Primary MD for the patient is Patient, No Pcp Per  LOS - 2  Chief Complaint  Patient presents with  . Rectal Bleeding       Brief Narrative   Paula Bass is a 47 y.o. female with medical history significant of SLE on chronic prednisone, ESRD on HD TTS, HTN, T2DM who recently moved from [place] who presented to the ED after an episode of large-volume hematochezia.  She was at HD today and felt the need to have a BM.  She held it as long as she could and had a large bloody BM.  She was recently admitted from 12/01/2017 to 12/04/2017 for acute diverticulitis with microperforation.  She was admitted to the hospital this time for acute lower GI bleed.   Subjective:   Patient in bed, appears comfortable, denies any headache, no fever, no chest pain or pressure, no shortness of breath , no abdominal pain. No focal weakness.  Bowel movements are getting clear she noted no blood last night and the bowel movement.   Assessment  & Plan :    1.  Acute lower GI bleed with acute blood loss related anemia on top of anemia of chronic disease.  This again seems to be consistent with acute lower GI bleed which is diverticular in nature, she has received 2 units of packed RBC so far last on 12/16/2017, continue to monitor H&H which remained stable this morning, continue Cipro Flagyl due to severe penicillin allergy, denies any further GI bleed, defer colonoscopy to GI who were thinking of doing it on 12/18/2017, currently n.p.o continue to monitor H&H.  2.  ESRD.  On Tuesday, Thursday and Saturday  schedule.  Renal has been consulted.  Undergoing dialysis today.  3.  Hypertension -continue on Lopressor hold Norvasc, PRN hydralazine and monitor  4.  SLE.  On chronic prednisone.  Blood pressure stable, will continue chronic prednisone at double home dose.  Hold stress dose steroids.  5.  Dyslipidemia.  On statin continue.  6.  DM type II.  Since oral diet is inconsistent  due to pending colonoscopy Lantus dose further reduced to avoid hypoglycemic episodes, or gentle D5 drip last night while she was n.p.o, if remains n.p.o. again throughout the day today will again resume low-dose D5 drip.  CBG (last 3)  Recent Labs    12/17/17 1154 12/17/17 1754 12/17/17 2135  GLUCAP 141* 215* 131*    Family Communication  :  None  Code Status :  Full  Disposition Plan  :  Med  Consults  :  GI, Renal  Procedures  :   Colonoscopy planned by GI on 12/18/2017   CT -  VASCULAR Abdominal aortoiliac atherosclerosis without occlusive disease, aneurysm, dissection, or acute vascular process. No evidence of active or acute arterial bleeding within the bowel by CTA. NON-VASCULAR Improving left lower quadrant descending colon diverticulitis as detailed above. No associated obstruction, developing abscess, ascites or free fluid Stable bilateral minimally complex renal cysts.  DVT Prophylaxis  :   SCDs    Lab Results  Component Value Date   PLT 120 (L) 12/17/2017    Diet :  Diet Order            Diet NPO time specified  Diet effective midnight               Inpatient Medications Scheduled Meds: . sodium chloride   Intravenous Once  . atorvastatin  40 mg Oral QPM  . Chlorhexidine Gluconate Cloth  6 each Topical Q0600  . darbepoetin (ARANESP) injection - DIALYSIS  100 mcg Intravenous Q Tue-HD  . doxercalciferol  4 mcg Intravenous Q T,Th,Sa-HD  . insulin aspart  0-5 Units Subcutaneous QHS  . insulin aspart  0-9 Units Subcutaneous TID WC  . insulin glargine  8 Units Subcutaneous Daily    . metoprolol tartrate  25 mg Oral BID  . pantoprazole  40 mg Oral Daily  . peg 3350 powder  0.5 kit Oral Once   And  . peg 3350 powder  0.5 kit Oral Once  . predniSONE  20 mg Oral Q breakfast  . sodium chloride flush  3 mL Intravenous Q12H   Continuous Infusions: . sodium chloride    . sodium chloride    . ciprofloxacin 400 mg (12/17/17 1649)  . metronidazole 500 mg (12/18/17 0221)   PRN Meds:.sodium chloride, sodium chloride, acetaminophen **OR** [DISCONTINUED] acetaminophen, calcium carbonate (dosed in mg elemental calcium), docusate sodium, feeding supplement (NEPRO CARB STEADY), hydrALAZINE, lidocaine (PF), lidocaine-prilocaine, [DISCONTINUED] ondansetron **OR** ondansetron (ZOFRAN) IV, pentafluoroprop-tetrafluoroeth, sorbitol, zolpidem  Antibiotics  :   Anti-infectives (From admission, onward)   Start     Dose/Rate Route Frequency Ordered Stop   12/15/17 1700  ciprofloxacin (CIPRO) IVPB 400 mg    Note to Pharmacy:  Please dose adjust for ESRD   400 mg 200 mL/hr over 60 Minutes Intravenous Every 24 hours 12/15/17 1604     12/15/17 1700  metroNIDAZOLE (FLAGYL) IVPB 500 mg     500 mg 100 mL/hr over 60 Minutes Intravenous Every 8 hours 12/15/17 1604            Objective:   Vitals:   12/18/17 0830 12/18/17 0845 12/18/17 0900 12/18/17 0915  BP: (!) 156/81 (!) 152/75 140/75 (!) 146/76  Pulse: 75 75 78 72  Resp: 20 (!) 21 (!) 22 (!) 22  Temp:      TempSrc:      SpO2:      Weight:      Height:        Wt Readings from Last  3 Encounters:  12/18/17 69.6 kg  12/01/17 68 kg     Intake/Output Summary (Last 24 hours) at 12/18/2017 0934 Last data filed at 12/17/2017 2225 Gross per 24 hour  Intake 1242 ml  Output -  Net 1242 ml     Physical Exam  Awake Alert, Oriented X 3, No new F.N deficits, Normal affect [place].AT,PERRAL Supple Neck,No JVD, No cervical lymphadenopathy appriciated.  Symmetrical Chest wall movement, Good air movement bilaterally, CTAB RRR,No  Gallops, Rubs or new Murmurs, No Parasternal Heave +ve B.Sounds, Abd Soft, No tenderness, No organomegaly appriciated, No rebound - guarding or rigidity. No Cyanosis, Clubbing or edema, No new Rash or bruise    Data Review:    CBC Recent Labs  Lab 12/15/17 1533 12/15/17 1855 12/16/17 0332  12/16/17 1001  12/16/17 1639 12/17/17 0427 12/17/17 1127 12/17/17 1941 12/18/17 0401  WBC 15.5* 14.7* 11.2*  --  11.9*  --   --   --   --  12.6*  --   HGB 7.6* 8.0* 7.3*   < > 7.1*   < > 6.7* 8.5* 9.6* 8.7* 8.5*  HCT 23.2* 24.8* 22.3*   < > 22.2*   < > 21.8* 25.4* 29.1* 27.7* 26.4*  PLT 177 176 171  --  176  --   --   --   --  120*  --   MCV 91.3 90.8 90.3  --  91.0  --   --   --   --  92.6  --   MCH 29.9 29.3 29.6  --  29.1  --   --   --   --  29.1  --   MCHC 32.8 32.3 32.7  --  32.0  --   --   --   --  31.4  --   RDW 15.3 15.4 15.9*  --  15.9*  --   --   --   --  16.1*  --    < > = values in this interval not displayed.    Chemistries  Recent Labs  Lab 12/15/17 1037 12/15/17 1049 12/16/17 0332 12/17/17 1127  NA 138 139 137 134*  K 3.2* 3.3* 5.1 4.7  CL 101 98 103 103  CO2 28  --  25 20*  GLUCOSE 176* 168* 152* 143*  BUN 28* 29* 35* 47*  CREATININE 5.84* 6.10* 7.30* 9.19*  CALCIUM 8.2*  --  8.1* 8.5*  AST 27  --   --   --   ALT 18  --   --   --   ALKPHOS 70  --   --   --   BILITOT 0.5  --   --   --    ------------------------------------------------------------------------------------------------------------------ No results for input(s): CHOL, HDL, LDLCALC, TRIG, CHOLHDL, LDLDIRECT in the last 72 hours.  Lab Results  Component Value Date   HGBA1C 7.0 (H) 12/15/2017   ------------------------------------------------------------------------------------------------------------------ No results for input(s): TSH, T4TOTAL, T3FREE, THYROIDAB in the last 72 hours.  Invalid input(s):  FREET3 ------------------------------------------------------------------------------------------------------------------ No results for input(s): VITAMINB12, FOLATE, FERRITIN, TIBC, IRON, RETICCTPCT in the last 72 hours.  Coagulation profile Recent Labs  Lab 12/15/17 1037  INR 1.20    No results for input(s): DDIMER in the last 72 hours.  Cardiac Enzymes No results for input(s): CKMB, TROPONINI, MYOGLOBIN in the last 168 hours.  Invalid input(s): CK ------------------------------------------------------------------------------------------------------------------ No results found for: BNP  Micro Results Recent Results (from the past 240 hour(s))  MRSA PCR Screening     Status:  None   Collection Time: 12/15/17  5:34 PM  Result Value Ref Range Status   MRSA by PCR NEGATIVE NEGATIVE Final    Comment:        The GeneXpert MRSA Assay (FDA approved for NASAL specimens only), is one component of a comprehensive MRSA colonization surveillance program. It is not intended to diagnose MRSA infection nor to guide or monitor treatment for MRSA infections. Performed at [facility], [place] [address] [place], [place] [address]     Radiology Reports Ct Abdomen Pelvis Wo Contrast  Result Date: 12/01/2017 CLINICAL DATA:  Left lower quadrant pain for a few days. EXAM: CT ABDOMEN AND PELVIS WITHOUT CONTRAST TECHNIQUE: Multidetector CT imaging of the abdomen and pelvis was performed following the standard protocol without IV contrast. COMPARISON:  None. FINDINGS: Lower chest: No acute abnormality. Hepatobiliary: No focal liver abnormality is seen. No gallstones, gallbladder wall thickening, or biliary dilatation. Pancreas: Unremarkable. No pancreatic ductal dilatation or surrounding inflammatory changes. Spleen: Normal in size without focal abnormality. Adrenals/Urinary Tract: Adrenal glands are normal. There is a 2.8 cm mass, exophytic off the lower left kidney with peripheral rim-like  calcification and a central attenuation of 12 Hounsfield units. There is a mass in the inferior pole the right kidney with a central attenuation of 13 Hounsfield units, consistent with a cyst with peripheral rim-like calcification. Other right renal cysts are noted. There appear to be a few punctate/small stones in both kidneys without hydronephrosis. Mild bilateral perinephric stranding is symmetric with no evidence of acute inflammation. The ureters and bladder are normal in appearance. Stomach/Bowel: The stomach and small bowel are normal. There is fat stranding surrounding the junction of the distal descending and proximal sigmoid colon. There are diverticula in this region. No extraluminal fluid collection. There is extraluminal gas as seen on series 2, image 49 likely due to a micro perforation. No other free air in the abdomen. Other scattered colonic diverticuli with no other sites of diverticulitis. The remainder of the colon is unremarkable. The appendix is not seen but there is no secondary evidence of appendicitis. Vascular/Lymphatic: Atherosclerotic changes are seen in the nonaneurysmal aorta without adenopathy. Reproductive: Uterus and bilateral adnexa are unremarkable. Other: No additional abnormalities. Musculoskeletal: No acute or significant osseous findings. IMPRESSION: 1. The abnormality at the junction of the distal descending colon and proximal sigmoid colon is most consistent with diverticulitis. A small amount of focal adjacent extraluminal air is consistent with a focal micro perforation. No abscess or pericolonic fluid collections. No other free air. Recommend follow-up to ensure complete resolution. 2. Both kidneys contain and mass which appears cystic centrally with peripheral eggshell like calcification. These are consistent with Bosniak 2 lesions requiring no follow-up. 3. Nonobstructive stones in both kidneys. 4. Atherosclerotic changes in the nonaneurysmal aorta. Electronically Signed    By: Dorise Bullion III M.D   On: 12/01/2017 22:04   Ct Angio Abd/pel W And/or Wo Contrast  Result Date: 12/15/2017 CLINICAL DATA:  Recent diverticulitis, lower GI bleeding EXAM: CT ANGIOGRAPHY ABDOMEN AND PELVIS WITH CONTRAST AND WITHOUT CONTRAST TECHNIQUE: Multidetector CT imaging of the abdomen and pelvis was performed using the standard protocol during bolus administration of intravenous contrast. Multiplanar reconstructed images and MIPs were obtained and reviewed to evaluate the vascular anatomy. CONTRAST:  8m ISOVUE-370 IOPAMIDOL (ISOVUE-370) INJECTION 76% COMPARISON:  12/01/2017 FINDINGS: VASCULAR Aorta: Mild-to-moderate aortic atherosclerosis without occlusive disease, aneurysm, dissection, or acute vascular process. Celiac: Remains patent including its branches SMA: Remains patent including its branches.  No evidence of active arterial bleeding or contrast extravasation within the bowel. Renals: Renal arteries remain patent. Accessory right renal artery also patent. IMA: Remains patent off the distal aorta including its branches. No active lower GI arterial bleeding appreciated. Inflow: Iliac vessels remain patent with atherosclerosis and tortuosity. No iliac inflow disease. Common, internal and external iliac arteries are patent. Proximal Outflow: Common femoral, proximal profunda femoral, proximal superficial femoral arteries are also patent. Veins: No veno-occlusive process. Review of the MIP images confirms the above findings. NON-VASCULAR Lower chest: Clear lung bases. Mild cardiac enlargement. No pericardial pleural effusion. Small hiatal hernia noted. No acute lower chest finding. Hepatobiliary: No focal liver abnormality is seen. No gallstones, gallbladder wall thickening, or biliary dilatation. Pancreas: Unremarkable. No pancreatic ductal dilatation or surrounding inflammatory changes. Spleen: Normal in size without focal abnormality. Adrenals/Urinary Tract: No acute adrenal abnormality.  Stable hypodense renal cysts some with peripheral wall calcifications. No renal obstruction or hydronephrosis. No hydroureter, ureteral calculus, or obstruction. Urinary bladder is collapsed likely accounting for wall thickening. Stomach/Bowel: Negative for bowel obstruction, significant dilatation, ileus, or free air. Scattered colonic diverticulosis. Appendix not well visualized. No acute inflammatory process in the right lower quadrant. Previous left lower quadrant descending colon diverticulitis shows interval improvement. There is less pericolonic strandy edema/inflammation. Adjacent extraluminal air has resolved. No fluid collection or abscess has developed. No new areas of additional diverticulitis. Lymphatic: No adenopathy. Reproductive: Uterus and adnexa unremarkable. No pelvic free fluid, fluid collection, hemorrhage or hematoma. No abscess. Other: No abdominal wall hernia or abnormality. No abdominopelvic ascites. Musculoskeletal: Degenerative changes of the spine SI joints. No acute osseous finding. IMPRESSION: VASCULAR Abdominal aortoiliac atherosclerosis without occlusive disease, aneurysm, dissection, or acute vascular process. No evidence of active or acute arterial bleeding within the bowel by CTA. NON-VASCULAR Improving left lower quadrant descending colon diverticulitis as detailed above. No associated obstruction, developing abscess, ascites or free fluid Stable bilateral minimally complex renal cysts Electronically Signed   By: Jerilynn Mages.  Shick M.D.   On: 12/15/2017 12:45    Time Spent in minutes  30   Lala Lund M.D on 12/18/2017 at 9:34 AM  To page go to [email or web address] - password [email or web address]

## 2017-12-18 NOTE — Progress Notes (Signed)
Orders received for Moviprep, patient informed of medication and purpose.  Patient refused stating "I already told them I'm not taking that stuff or doing no colonoscopy, my doctor wants me to do it outpatient".

## 2017-12-18 NOTE — Progress Notes (Signed)
   12/18/17 1120  Hand-Off documentation  Report given to (Full Name) Cristela Blue RN  Report received from (Full Name) Delrae Rend RN  Vital Signs  Temp 98.9 F (37.2 C)  Temp Source Axillary  Pulse Rate 80  Resp (!) 21  BP 132/74  During Hemodialysis Assessment  Intra-Hemodialysis Comments Tx completed  Post-Hemodialysis Assessment  Rinseback Volume (mL) 250 mL  KECN 279 V  Dialyzer Clearance Lightly streaked  Duration of HD Treatment -hour(s) 3.15 hour(s)  Hemodialysis Intake (mL) 500 mL  UF Total -Machine (mL) 2800 mL  Net UF (mL) 2300 mL  Tolerated HD Treatment Yes  Post-Hemodialysis Comments Stable tx  AVG/AVF Arterial Site Held (minutes) 10 minutes  AVG/AVF Venous Site Held (minutes) 5 minutes  Education / Care Plan  Dialysis Education Provided Yes  Documented Education in Care Plan Yes  Outpatient Plan of Care Reviewed and on Chart Yes

## 2017-12-18 NOTE — Plan of Care (Signed)

## 2017-12-18 NOTE — Op Note (Deleted)
[facility] Patient Name: Paula Bass Procedure Date : 12/18/2017 MRN: [id number] Attending MD: Justice Britain , MD Date of Birth: May 12, 1970 CSN: [id number] Age: 48 Admit Type: Inpatient Procedure:                Colonoscopy Indications:               Providers:                Justice Britain, MD Referring MD:              Medicines:                 Complications:             Estimated Blood Loss:      Procedure:                After obtaining informed consent, the colonoscope                            was passed under direct vision. Throughout the                            procedure, the patient's blood pressure, pulse, and                            oxygen saturations were monitored continuously. Scope In: 9:59:36 AM Scope Out: 10:30:13 AM Scope Withdrawal Time: 0 hours 27 minutes 9 seconds  Total Procedure Duration: 0 hours 30 minutes 37 seconds  Findings:                  Impression:                Recommendation:            Procedure Code(s):         Diagnosis Code(s):         CPT copyright 2018 [employer]. All rights reserved. The codes documented in this report are preliminary and upon coder review may  be revised to meet current compliance requirements. Justice Britain, MD 12/18/2017 10:50:36 AM Number of Addenda: 0

## 2017-12-18 NOTE — Procedures (Signed)
I was present at this dialysis session. I have reviewed the session itself and made appropriate changes.  She refused colonoscopy and will do one as an outpatient.   Vital signs in last 24 hours:  Temp:  [98 F (36.7 C)-98.4 F (36.9 C)] 98 F (36.7 C) (10/22 0800) Pulse Rate:  [75-82] 75 (10/22 0845) Resp:  [16-21] 21 (10/22 0845) BP: (136-174)/(74-97) 152/75 (10/22 0845) SpO2:  [98 %-100 %] 100 % (10/22 0800) Weight:  [69.6 kg] 69.6 kg (10/22 0800) Weight change:  Filed Weights   12/15/17 1024 12/15/17 1654 12/18/17 0800  Weight: 68 kg 70.4 kg 69.6 kg    Recent Labs  Lab 12/17/17 1127  NA 134*  K 4.7  CL 103  CO2 20*  GLUCOSE 143*  BUN 47*  CREATININE 9.19*  CALCIUM 8.5*  PHOS 5.8*    Recent Labs  Lab 12/16/17 0332  12/16/17 1001  12/17/17 1127 12/17/17 1941 12/18/17 0401  WBC 11.2*  --  11.9*  --   --  12.6*  --   HGB 7.3*   < > 7.1*   < > 9.6* 8.7* 8.5*  HCT 22.3*   < > 22.2*   < > 29.1* 27.7* 26.4*  MCV 90.3  --  91.0  --   --  92.6  --   PLT 171  --  176  --   --  120*  --    < > = values in this interval not displayed.    Scheduled Meds: . sodium chloride   Intravenous Once  . atorvastatin  40 mg Oral QPM  . Chlorhexidine Gluconate Cloth  6 each Topical Q0600  . darbepoetin (ARANESP) injection - DIALYSIS  100 mcg Intravenous Q Tue-HD  . doxercalciferol  4 mcg Intravenous Q T,Th,Sa-HD  . insulin aspart  0-5 Units Subcutaneous QHS  . insulin aspart  0-9 Units Subcutaneous TID WC  . insulin glargine  8 Units Subcutaneous Daily  . metoprolol tartrate  25 mg Oral BID  . pantoprazole  40 mg Oral Daily  . peg 3350 powder  0.5 kit Oral Once   And  . peg 3350 powder  0.5 kit Oral Once  . predniSONE  20 mg Oral Q breakfast  . sodium chloride flush  3 mL Intravenous Q12H   Continuous Infusions: . sodium chloride    . sodium chloride    . ciprofloxacin 400 mg (12/17/17 1649)  . dextrose 50 mL/hr at 12/17/17 2225  . metronidazole 500 mg (12/18/17 0221)    PRN Meds:.sodium chloride, sodium chloride, acetaminophen **OR** [DISCONTINUED] acetaminophen, calcium carbonate (dosed in mg elemental calcium), docusate sodium, feeding supplement (NEPRO CARB STEADY), hydrALAZINE, lidocaine (PF), lidocaine-prilocaine, [DISCONTINUED] ondansetron **OR** ondansetron (ZOFRAN) IV, pentafluoroprop-tetrafluoroeth, sorbitol, zolpidem    Dialysis Orders:  TTS at [facility] 3:15hr, 400/800, EEDW 69kg, 2K/2.25Ca, AVG, heparin 2000 units IV + 2000 units IV mid-run bolus - Hctoral 76mg IV q HD  Assessment/Plan: 1. Acute Lower GIB (?diverticular): Per GI. Plan was for colonoscopy today but she refused and wants to do it with her primary GI MD. HGB 8.5 Transfuse prn. 2. ESRD: T,Th,S. And is on schedule.  No Heparin. K+ 5.1 12/16/17. RFP ordered for today.  3. Hypertension/volume: BP stable, no edema. Last HD 10/19 only stayed 54 minutes. Left at OP EDW. Has BLE edema. 4. Anemia (ABLA): HGB 8.5 S/P 2 units PRBCs Has not required ESA as OP. Aranesp 100 mcg IV with HD tomorrow. Follow HGB and transfuse PRN.  5. Metabolic  bone disease: Ca 8.1.  Clear liquids now - resume binders once allowed to eat. 6. CAD (Hx stent): Plavix on hold currently. 7. Type 2 DM 8. SLE (on prednisone) 9. Disposition- if hgb stable ok for discharge if she if refusing colonoscopy as an inpatient.   Donetta Potts,  MD 12/18/2017, 8:53 AM

## 2017-12-18 NOTE — Progress Notes (Signed)
A colonoscopy report was placed into this patient's chart incorrectly. This is in the process of being deleted from this patient's chart. She has not had a colonoscopy.

## 2017-12-19 DIAGNOSIS — N186 End stage renal disease: Secondary | ICD-10-CM

## 2017-12-19 LAB — TYPE AND SCREEN
ABO/RH(D): O POS
ANTIBODY SCREEN: NEGATIVE
UNIT DIVISION: 0
Unit division: 0
Unit division: 0

## 2017-12-19 LAB — HEMOGLOBIN AND HEMATOCRIT, BLOOD
HCT: 27.8 % — ABNORMAL LOW (ref 36.0–46.0)
Hemoglobin: 9.1 g/dL — ABNORMAL LOW (ref 12.0–15.0)

## 2017-12-19 LAB — BPAM RBC
BLOOD PRODUCT EXPIRATION DATE: 201911172359
Blood Product Expiration Date: 201911172359
Blood Product Expiration Date: 201911182359
ISSUE DATE / TIME: 201910191209
ISSUE DATE / TIME: 201910202302
UNIT TYPE AND RH: 5100
Unit Type and Rh: 5100
Unit Type and Rh: 5100

## 2017-12-19 LAB — GLUCOSE, CAPILLARY
Glucose-Capillary: 102 mg/dL — ABNORMAL HIGH (ref 70–99)
Glucose-Capillary: 226 mg/dL — ABNORMAL HIGH (ref 70–99)

## 2017-12-19 MED ORDER — FERROUS SULFATE 325 (65 FE) MG PO TBEC: 325.0000 mg | DELAYED_RELEASE_TABLET | Freq: Two times a day (BID) | ORAL | 3 refills | Status: DC

## 2017-12-19 MED ORDER — PANTOPRAZOLE SODIUM 40 MG PO TBEC: 40.0000 mg | DELAYED_RELEASE_TABLET | Freq: Every day | ORAL | 0 refills | Status: DC

## 2017-12-19 MED ORDER — CLOPIDOGREL BISULFATE 75 MG PO TABS: 75.0000 mg | ORAL_TABLET | Freq: Every day | ORAL | Status: DC

## 2017-12-19 MED ORDER — METRONIDAZOLE 500 MG PO TABS: 500.0000 mg | ORAL_TABLET | Freq: Three times a day (TID) | ORAL | 0 refills | Status: AC

## 2017-12-19 MED ORDER — ASPIRIN EC 81 MG PO TBEC: 81.0000 mg | DELAYED_RELEASE_TABLET | Freq: Every day | ORAL | Status: DC

## 2017-12-19 MED ORDER — CIPROFLOXACIN HCL 500 MG PO TABS: 500.0000 mg | ORAL_TABLET | Freq: Every day | ORAL | 0 refills | Status: DC

## 2017-12-19 MED ORDER — FERRIC CITRATE 1 GM 210 MG(FE) PO TABS
210.0000 mg | ORAL_TABLET | Freq: Three times a day (TID) | ORAL | Status: DC
  Administered 2017-12-19: 210 mg via ORAL
  Filled 2017-12-19: qty 1

## 2017-12-19 MED ORDER — PRO-STAT SUGAR FREE PO LIQD
30.0000 mL | Freq: Two times a day (BID) | ORAL | Status: DC
  Administered 2017-12-19: 30 mL via ORAL
  Filled 2017-12-19: qty 30

## 2017-12-19 NOTE — Plan of Care (Signed)
Nsg Discharge Note  Admit Date:  12/15/2017 Discharge date: 12/19/2017   Paula Bass to be D/C'd Home per MD order.  AVS completed.  Copy for chart, and copy for patient signed, and dated. Patient/caregiver able to verbalize understanding.  Discharge Medication: Allergies as of 12/19/2017       Reactions   Penicillins Hives        Medication List     TAKE these medications    amLODipine 10 MG tablet Commonly known as:  NORVASC Take 10 mg by mouth daily.   aspirin EC 81 MG tablet Take 1 tablet (81 mg total) by mouth daily. Hold for next 2 weeks and resume on 01/02/2018 Start taking on:  01/02/2018 What changed:   additional instructions These instructions start on 01/02/2018. If you are unsure what to do until then, ask your doctor or other care provider.   atorvastatin 40 MG tablet Commonly known as:  LIPITOR Take 40 mg by mouth every evening.   ciprofloxacin 500 MG tablet Commonly known as:  CIPRO Take 1 tablet (500 mg total) by mouth daily with breakfast.   clopidogrel 75 MG tablet Commonly known as:  PLAVIX Take 1 tablet (75 mg total) by mouth daily. Hold for next 2 weeks Start taking on:  01/02/2018 What changed:   additional instructions These instructions start on 01/02/2018. If you are unsure what to do until then, ask your doctor or other care provider.   ferrous sulfate 325 (65 FE) MG EC tablet Take 1 tablet (325 mg total) by mouth 2 (two) times daily.   insulin aspart protamine- aspart (70-30) 100 UNIT/ML injection Commonly known as:  NOVOLOG MIX 70/30 Inject 30-40 Units into the skin See admin instructions. 40 units in the morning, 30 units at night   lidocaine-prilocaine cream Commonly known as:  EMLA Apply 1 application topically daily.   metoprolol tartrate 25 MG tablet Commonly known as:  LOPRESSOR Take 25 mg by mouth 2 (two) times daily.   metroNIDAZOLE 500 MG tablet Commonly known as:  FLAGYL Take 1 tablet (500 mg total) by mouth 3  (three) times daily for 7 days.   OVER THE COUNTER MEDICATION Take 1 tablet by mouth daily. Pt takes OTC venavit tablet PO daily   pantoprazole 40 MG tablet Commonly known as:  PROTONIX Take 1 tablet (40 mg total) by mouth daily. Start taking on:  12/20/2017   predniSONE 10 MG tablet Commonly known as:  DELTASONE Take 10 mg by mouth daily.        Discharge Assessment: Vitals:   12/19/17 0607 12/19/17 1322  BP: (!) 147/83 (!) 146/76  Pulse: 90 81  Resp:  18  Temp: 98.5 F (36.9 C) 98.1 F (36.7 C)  SpO2: 100% 100%   Skin clean, dry and intact without evidence of skin break down, no evidence of skin tears noted. IV catheter discontinued intact. Site without signs and symptoms of complications - no redness or edema noted at insertion site, patient denies c/o pain - only slight tenderness at site.  Dressing with slight pressure applied.  D/c Instructions-Education: Discharge instructions given to patient/family with verbalized understanding. D/c education completed with patient/family including follow up instructions, medication list, d/c activities limitations if indicated, with other d/c instructions as indicated by MD - patient able to verbalize understanding, all questions fully answered. Patient instructed to return to ED, call 911, or call MD for any changes in condition.  Patient escorted via [place], and D/C home via private auto.  Jocelyn Lamer  Mohammed Kindle, RN 12/19/2017 3:49 PM

## 2017-12-19 NOTE — Plan of Care (Signed)
Patient is progressing. Still complains of minor left flank pain. Hgb 9.1 this morning. States she had one stool last night and it was loose and green. HD Fistula in LUA with bandaids. Positive for bruit. Will continue to follow as needed.

## 2017-12-19 NOTE — Progress Notes (Addendum)
Daily Rounding Note  12/19/2017, 10:22 AM  LOS: 3 days   SUBJECTIVE:   Chief complaint: bloody stool.  Left abd paijn   Bleeding resolved.  Twinges of discomfort in LLQ with meals.  Stools brown/green, soft. Tolerating solids  OBJECTIVE:         Vital signs in last 24 hours:    Temp:  [98.3 F (36.8 C)-98.9 F (37.2 C)] 98.5 F (36.9 C) (10/23 0607) Pulse Rate:  [80-99] 90 (10/23 0607) Resp:  [18-21] 18 (10/22 1520) BP: (127-147)/(66-83) 147/83 (10/23 0607) SpO2:  [100 %] 100 % (10/23 0607) Last BM Date: 12/18/17 Filed Weights   12/15/17 1024 12/15/17 1654 12/18/17 0800  Weight: 68 kg 70.4 kg 69.6 kg   General: looks well   Heart: RRR Chest: clear bil Abdomen: soft, mild LLQ tenderness.  Active BS.  ND  Extremities: no CCE Neuro/Psych:  Oriented and alert.  No deficits.    Intake/Output from previous day: 10/22 0701 - 10/23 0700 In: 120 [P.O.:120] Out: 2300   Intake/Output this shift: No intake/output data recorded.  Lab Results: Recent Labs    12/17/17 1941 12/18/17 0401 12/18/17 1440 12/19/17 0238  WBC 12.6*  --   --   --   HGB 8.7* 8.5* 10.4* 9.1*  HCT 27.7* 26.4* 31.0* 27.8*  PLT 120*  --   --   --    BMET Recent Labs    12/17/17 1127  NA 134*  K 4.7  CL 103  CO2 20*  GLUCOSE 143*  BUN 47*  CREATININE 9.19*  CALCIUM 8.5*   Scheduled Meds: . sodium chloride   Intravenous Once  . atorvastatin  40 mg Oral QPM  . Chlorhexidine Gluconate Cloth  6 each Topical Q0600  . darbepoetin (ARANESP) injection - DIALYSIS  100 mcg Intravenous Q Tue-HD  . doxercalciferol  4 mcg Intravenous Q T,Th,Sa-HD  . insulin aspart  0-5 Units Subcutaneous QHS  . insulin aspart  0-9 Units Subcutaneous TID WC  . insulin glargine  8 Units Subcutaneous Daily  . metoprolol tartrate  25 mg Oral BID  . pantoprazole  40 mg Oral Daily  . peg 3350 powder  0.5 kit Oral Once   And  . peg 3350 powder  0.5 kit Oral  Once  . predniSONE  20 mg Oral Q breakfast  . sodium chloride flush  3 mL Intravenous Q12H   Continuous Infusions: . sodium chloride    . sodium chloride    . ciprofloxacin 400 mg (12/18/17 1800)  . metronidazole 500 mg (12/19/17 1017)   PRN Meds:.sodium chloride, sodium chloride, acetaminophen **OR** [DISCONTINUED] acetaminophen, calcium carbonate (dosed in mg elemental calcium), docusate sodium, feeding supplement (NEPRO CARB STEADY), hydrALAZINE, lidocaine (PF), lidocaine-prilocaine, [DISCONTINUED] ondansetron **OR** ondansetron (ZOFRAN) IV, pentafluoroprop-tetrafluoroeth, sorbitol, zolpidem   ASSESMENT:   *    Lower GI bleeding.  Sigmoid diverticulitis, admission 10/5-10/8 2019 for diverticulitis with microperforation.  At readmission 10/19 had just finished outpatient oral antibiotics, Ct showed improved diverticulitis.  Day 5 IV Cipro/Flagyl. CTA abdomen pelvis reveals improving diverticulitis.  *    Acute on chronic anemia.  Has received 2 units PRBCs thus far. On Aranesp Hgb 6.7 >> 8.5 >> 10.4 >> 9.1.    *    ESRD, on hemodialysis TTS.  *   Chronic Plavix for history of cardiac stenting 2017.  On hold.    *    Lupus.  On chronic prednisone.   PLAN   *  Ok to go home today.  Finish  5 (total 10d) of oral cipro/flagyl. Has ROV with GI PA on 11/18 at which point colonoscopy can be set up Can she stay off Plavix for next several weeks?: this is GI preference but if it is strongly needed, could restart in 10 days.   Renal diet.      Azucena Freed  12/19/2017, 10:22 AM Phone [phone]

## 2017-12-19 NOTE — Progress Notes (Addendum)
The patient will be followed up on Wednesday 10/23.  Based on how she is doing will decide finally whether patient has a colonoscopy as an inpatient versus as an outpatient in the next few weeks.  Monitoring of her hemoglobin is important and thus that as well as anything that comes out from her bottom in regards to maroon stools or hematochezia will help dictate whether we put her at an increased risk colonoscopy now on Thursday or Friday versus an outpatient colonoscopy in 2 to 3 weeks.  We will see her in follow-up and determine this timing.   Justice Britain, MD [facility] Office # [phone]

## 2017-12-19 NOTE — Plan of Care (Signed)
Pt. Is making progress, continues on iv metronidazole, bm loose  and green. Will continue to monitor.

## 2017-12-19 NOTE — Discharge Instructions (Signed)
Follow with Primary MD in 3 days   Get CBC, CMP, checked  by Primary MD next visit.    Activity: As tolerated with Full fall precautions use walker/cane & assistance as needed   Disposition Home    Diet: Heart Healthy , renal modified with 1200 cc fluid restriction with feeding assistance and aspiration precautions.  For Heart failure patients - Check your Weight same time everyday, if you gain over 2 pounds, or you develop in leg swelling, experience more shortness of breath or chest pain, call your Primary MD immediately. Follow Cardiac Low Salt Diet and 1.5 lit/day fluid restriction.   On your next visit with your primary care physician please Get Medicines reviewed and adjusted.   Please request your Prim.MD to go over all Hospital Tests and Procedure/Radiological results at the follow up, please get all Hospital records sent to your Prim MD by signing hospital release before you go home.   If you experience worsening of your admission symptoms, develop shortness of breath, life threatening emergency, suicidal or homicidal thoughts you must seek medical attention immediately by calling 911 or calling your MD immediately  if symptoms less severe.  You Must read complete instructions/literature along with all the possible adverse reactions/side effects for all the Medicines you take and that have been prescribed to you. Take any new Medicines after you have completely understood and accpet all the possible adverse reactions/side effects.   Do not drive, operating heavy machinery, perform activities at heights, swimming or participation in water activities or provide baby sitting services if your were admitted for syncope or siezures until you have seen by Primary MD or a Neurologist and advised to do so again.  Do not drive when taking Pain medications.    Do not take more than prescribed Pain, Sleep and Anxiety Medications  Special Instructions: If you have smoked or chewed  Tobacco  in the last 2 yrs please stop smoking, stop any regular Alcohol  and or any Recreational drug use.  Wear Seat belts while driving.   Please note  You were cared for by a hospitalist during your hospital stay. If you have any questions about your discharge medications or the care you received while you were in the hospital after you are discharged, you can call the unit and asked to speak with the hospitalist on call if the hospitalist that took care of you is not available. Once you are discharged, your primary care physician will handle any further medical issues. Please note that NO REFILLS for any discharge medications will be authorized once you are discharged, as it is imperative that you return to your primary care physician (or establish a relationship with a primary care physician if you do not have one) for your aftercare needs so that they can reassess your need for medications and monitor your lab values.

## 2017-12-19 NOTE — Progress Notes (Addendum)
[facility] Progress Note   Subjective: Awake, denies bloody stools, abdominal pain. Hoping to go home today.    Objective Vitals:   12/18/17 1120 12/18/17 1520 12/18/17 2125 12/19/17 0607  BP: 132/74 127/75 139/77 (!) 147/83  Pulse: 80 88 99 90  Resp: (!) 21 18    Temp: 98.9 F (37.2 C) 98.3 F (36.8 C) 98.4 F (36.9 C) 98.5 F (36.9 C)  TempSrc: Axillary     SpO2:  100% 100% 100%  Weight:      Height:       Physical Exam General: WN,WD NAD Heart: S1,S2, RRR Lungs: CTAB A/P Abdomen: Active BS, ND, NT Extremities: trace BLE edema Dialysis Access: L AVF + bruit   Additional Objective Labs: Basic Metabolic Panel: Recent Labs  Lab 12/15/17 1037 12/15/17 1049 12/16/17 0332 12/17/17 1127  NA 138 139 137 134*  K 3.2* 3.3* 5.1 4.7  CL 101 98 103 103  CO2 28  --  25 20*  GLUCOSE 176* 168* 152* 143*  BUN 28* 29* 35* 47*  CREATININE 5.84* 6.10* 7.30* 9.19*  CALCIUM 8.2*  --  8.1* 8.5*  PHOS  --   --   --  5.8*   Liver Function Tests: Recent Labs  Lab 12/15/17 1037 12/17/17 1127  AST 27  --   ALT 18  --   ALKPHOS 70  --   BILITOT 0.5  --   PROT 5.7*  --   ALBUMIN 2.6* 2.8*   No results for input(s): LIPASE, AMYLASE in the last 168 hours. CBC: Recent Labs  Lab 12/15/17 1533 12/15/17 1855 12/16/17 0332  12/16/17 1001  12/17/17 1941 12/18/17 0401 12/18/17 1440 12/19/17 0238  WBC 15.5* 14.7* 11.2*  --  11.9*  --  12.6*  --   --   --   HGB 7.6* 8.0* 7.3*   < > 7.1*   < > 8.7* 8.5* 10.4* 9.1*  HCT 23.2* 24.8* 22.3*   < > 22.2*   < > 27.7* 26.4* 31.0* 27.8*  MCV 91.3 90.8 90.3  --  91.0  --  92.6  --   --   --   PLT 177 176 171  --  176  --  120*  --   --   --    < > = values in this interval not displayed.   Blood Culture    Component Value Date/Time   SDES  12/01/2017 1747    URINE, RANDOM Performed at [facility], [place] [address], [place], [place] [address]    SPECREQUEST  12/01/2017 1747    NONE Performed  at [facility], [place] [address] [place], [place] [address]    CULT (A) 12/01/2017 1747    >=100,000 COLONIES/mL GROUP B STREP(S.AGALACTIAE)ISOLATED TESTING AGAINST S. AGALACTIAE NOT ROUTINELY PERFORMED DUE TO PREDICTABILITY OF AMP/PEN/VAN SUSCEPTIBILITY. Performed at [facility], [place] [address], [place],  [address]    REPTSTATUS 12/03/2017 FINAL 12/01/2017 1747    Cardiac Enzymes: No results for input(s): CKTOTAL, CKMB, CKMBINDEX, TROPONINI in the last 168 hours. CBG: Recent Labs  Lab 12/18/17 1324 12/18/17 1344 12/18/17 1709 12/18/17 2125 12/19/17 0803  GLUCAP 63* 73 241* 165* 102*   Iron Studies: No results for input(s): IRON, TIBC, TRANSFERRIN, FERRITIN in the last 72 hours. _0 @ Studies/Results: No results found. Medications: . sodium chloride    . sodium chloride    . ciprofloxacin 400 mg (12/18/17 1800)  . metronidazole 500 mg (12/19/17 1017)   .  sodium chloride   Intravenous Once  . atorvastatin  40 mg Oral QPM  . Chlorhexidine Gluconate Cloth  6 each Topical Q0600  . darbepoetin (ARANESP) injection - DIALYSIS  100 mcg Intravenous Q Tue-HD  . doxercalciferol  4 mcg Intravenous Q T,Th,Sa-HD  . insulin aspart  0-5 Units Subcutaneous QHS  . insulin aspart  0-9 Units Subcutaneous TID WC  . insulin glargine  8 Units Subcutaneous Daily  . metoprolol tartrate  25 mg Oral BID  . pantoprazole  40 mg Oral Daily  . peg 3350 powder  0.5 kit Oral Once   And  . peg 3350 powder  0.5 kit Oral Once  . predniSONE  20 mg Oral Q breakfast  . sodium chloride flush  3 mL Intravenous Q12H     Dialysis Orders:  TTS at [facility] 3:15hr, 400/800, EEDW 69kg, 2K/2.25Ca, AVG, heparin 2000 units IV + 2000 units IV mid-run bolus - Hctoral 77mg IV q HD  Assessment/Plan: 1. Acute Lower GIB (?diverticular): Seen by GI however she deferred colonoscopy-plans to follow up with OP GI. 5 days course of Cipro/Flagyl. HGB 9.1. Hopefully DC home today.   2. ESRD: T,Th,S. Next HD 12/20/17 at OP clinic.  3. Hypertension/volume: HD 10/23 Pre wt 69.6 Net UF 2.3 L Post wt not done. Get weight today prior to DC.  4. Anemia (ABLA): HGB 9.1 S/P 2 units PRBCs Has not required ESA as OP. Aranesp 100 mcg IV with HD 12/18/17. Follow HGB and transfuse PRN.  5. Metabolic bone disease: Ca 8.1.  Soft diet- resume binders.  6. CAD (Hx stent): Plavix on hold currently. 7. Type 2 DM 8. SLE (on prednisone) 9.    Nutrition: Albumin 2.8. Soft diet, add prostat.   Rita H. Brown NP-C 12/19/2017, 11:20 AM  [employer]ell [phone] I have seen and examined this patient and agree with plan and assessment in the above note with renal recommendations/intervention highlighted.  Pt feels better and would like to have an outpatient colonoscopy.  Plan for discharge to home [facility] JBroadus JohnA [place] Kreitzer,MD 12/19/2017 1:18 PM

## 2017-12-19 NOTE — Discharge Summary (Signed)
Paula Bass, is a 47 y.o. female  DOB 01/18/1971  MRN [id number].  Admission date:  12/15/2017  Admitting Physician  Karmen Bongo, MD  Discharge Date:  12/19/2017   Primary MD  Patient, No Pcp Per  Recommendations for primary care physician for things to follow:  -Monitor CBC during next 2-3 sessions of hemodialysis -To follow with GI as an outpatient on scheduled appointment 01/14/2018   Admission Diagnosis  Blood loss anemia [D50.0] Rectal bleeding [K62.5]   Discharge Diagnosis  Blood loss anemia [D50.0] Rectal bleeding [K62.5]    Principal Problem:   Hematochezia Active Problems:   ESRD (end stage renal disease) ([place])   DM2 (diabetes mellitus, type 2) ([place])   Lupus ([place])   Benign essential HTN   LGI bleed   Blood loss anemia   Diverticulitis of colon      Past Medical History:  Diagnosis Date  . CAD (coronary artery disease) 2016  . Diabetes mellitus without complication ([place])   . Dialysis patient ([place])   . Diverticulitis   . GI bleeding   . Hypertension   . Lupus [facility])     Past Surgical History:  Procedure Laterality Date  . CARDIAC SURGERY     cardiac shunt       History of present illness and  Hospital Course:     Kindly see H&P for history of present illness and admission details, please review complete Labs, Consult reports and Test reports for all details in brief  HPI  from the history and physical done on the day of admission 12/15/2017  HPI: Paula Bass is a 47 y.o. female with medical history significant of SLE on chronic prednisone, ESRD on HD TTS, HTN, T2DM who recently moved from [place] who presented to the ED after an episode of large-volume hematochezia.  She was at HD today and felt the need to have a BM.  She held it as long as she could and had a large bloody BM.  She continued with active bleeding after arrival here, "just one big episode  that was just bleeding and bleeding and bleeding."  Her bleeding is slowly stopping.  During her prior admission, she did not have bleeding.  She feels better now, although she has not attempted to get out of bed.  She has received blood one time prior, when she had a stent placed.  She was admitted to [facility] from 10/5-8 for acute diverticulitis with microperforation.  She was discharged with antibiotics and has not had time to establish with a new PCP.  ED Course:  Likely a diverticular bleed.  Recently admitted for diverticulitis.  ESRD, on Plavix. She went to HD today, grossly bloody BM in the chair.  Large volume BM upon arrival, given 1 unit PRBC when BP dropped.  Also given platelets.  Hgb <7.  CT without extravasation, nothing to embolize.  GI has seen.  Needs admission to SDU for now  [place] a 47 y.o.femalewith medical history significant ofSLE on  chronic prednisone, ESRD on HD TTS, HTN, T2DM who recently moved from [place] who presented to the EDafter an episode of large-volume hematochezia.She was at HD today and felt the need to have a BM. She held it as long as she could and had a large bloody BM. She was recently admitted from 12/01/2017 to 12/04/2017 for acute diverticulitis with microperforation.  She was admitted to the hospital this time for acute lower GI bleed.    Acute lower GI bleed  -This is most likely due to a diverticular bleed in nature, she was transfused 2 units PRBC, last on 12/16/2017, albumin remained stable since , was seen by GI, felt she will need colonoscopy during hospital stay, will be done as an outpatient . -Instructed to hold her aspirin and Plavix for next 2 weeks  -Be discharged on Protonix, she is with recent acute diverticular colitis episodes, she will finish another 5 days of oral Cipro and Flagyl as an outpatient . -No further GI bleed .  Acute blood loss anemia -With baseline anemia  of chronic disease, with acute  blood loss anemia due to current lower GI bleed, transfused 2 units PRBC, when stable, she will be discharged on iron supplements  ESRD.  On Tuesday, Thursday and Saturday schedule.  Renal has been consulted.   Received dialysis during hospital stay, to resume dialysis tomorrow as an outpatient  Hypertension -continue o with home medication  SLE.  On chronic prednisone.  Blood pressure stable, will continue chronic prednisone home dose on discharge  Dyslipidemia.  On statin continue.  DM type II.    Resume home meds  Discharge Condition:  Stable   Follow UP  Follow-up Information    Levin Erp, PA Follow up on 01/14/2018.   Specialty:  Gastroenterology Why:  10:15.  follow up with GI.   Contact information: [address] [address] [phone]             Discharge Instructions  and  Discharge Medications    Discharge Instructions    Discharge instructions   Complete by:  As directed    Follow with Primary MD in 3 days   Get CBC, CMP, checked  by Primary MD next visit.    Activity: As tolerated with Full fall precautions use walker/cane & assistance as needed   Disposition Home    Diet: Heart Healthy , renal modified with 1200 cc fluid restriction with feeding assistance and aspiration precautions.  For Heart failure patients - Check your Weight same time everyday, if you gain over 2 pounds, or you develop in leg swelling, experience more shortness of breath or chest pain, call your Primary MD immediately. Follow Cardiac Low Salt Diet and 1.5 lit/day fluid restriction.   On your next visit with your primary care physician please Get Medicines reviewed and adjusted.   Please request your Prim.MD to go over all Hospital Tests and Procedure/Radiological results at the follow up, please get all Hospital records sent to your Prim MD by signing hospital release before you go home.   If you experience worsening of your  admission symptoms, develop shortness of breath, life threatening emergency, suicidal or homicidal thoughts you must seek medical attention immediately by calling 911 or calling your MD immediately  if symptoms less severe.  You Must read complete instructions/literature along with all the possible adverse reactions/side effects for all the Medicines you take and that have been prescribed to you. Take any new Medicines  after you have completely understood and accpet all the possible adverse reactions/side effects.   Do not drive, operating heavy machinery, perform activities at heights, swimming or participation in water activities or provide baby sitting services if your were admitted for syncope or siezures until you have seen by Primary MD or a Neurologist and advised to do so again.  Do not drive when taking Pain medications.    Do not take more than prescribed Pain, Sleep and Anxiety Medications  Special Instructions: If you have smoked or chewed Tobacco  in the last 2 yrs please stop smoking, stop any regular Alcohol  and or any Recreational drug use.  Wear Seat belts while driving.   Please note  You were cared for by a hospitalist during your hospital stay. If you have any questions about your discharge medications or the care you received while you were in the hospital after you are discharged, you can call the unit and asked to speak with the hospitalist on call if the hospitalist that took care of you is not available. Once you are discharged, your primary care physician will handle any further medical issues. Please note that NO REFILLS for any discharge medications will be authorized once you are discharged, as it is imperative that you return to your primary care physician (or establish a relationship with a primary care physician if you do not have one) for your aftercare needs so that they can reassess your need for medications and monitor your lab values.   Increase activity  slowly   Complete by:  As directed      Allergies as of 12/19/2017      Reactions   Penicillins Hives      Medication List    TAKE these medications   amLODipine 10 MG tablet Commonly known as:  NORVASC Take 10 mg by mouth daily.   aspirin EC 81 MG tablet Take 1 tablet (81 mg total) by mouth daily. Hold for next 2 weeks and resume on 01/02/2018 Start taking on:  01/02/2018 What changed:    additional instructions  These instructions start on 01/02/2018. If you are unsure what to do until then, ask your doctor or other care provider.   atorvastatin 40 MG tablet Commonly known as:  LIPITOR Take 40 mg by mouth every evening.   ciprofloxacin 500 MG tablet Commonly known as:  CIPRO Take 1 tablet (500 mg total) by mouth daily with breakfast.   clopidogrel 75 MG tablet Commonly known as:  PLAVIX Take 1 tablet (75 mg total) by mouth daily. Hold for next 2 weeks Start taking on:  01/02/2018 What changed:    additional instructions  These instructions start on 01/02/2018. If you are unsure what to do until then, ask your doctor or other care provider.   ferrous sulfate 325 (65 FE) MG EC tablet Take 1 tablet (325 mg total) by mouth 2 (two) times daily.   insulin aspart protamine- aspart (70-30) 100 UNIT/ML injection Commonly known as:  NOVOLOG MIX 70/30 Inject 30-40 Units into the skin See admin instructions. 40 units in the morning, 30 units at night   lidocaine-prilocaine cream Commonly known as:  EMLA Apply 1 application topically daily.   metoprolol tartrate 25 MG tablet Commonly known as:  LOPRESSOR Take 25 mg by mouth 2 (two) times daily.   metroNIDAZOLE 500 MG tablet Commonly known as:  FLAGYL Take 1 tablet (500 mg total) by mouth 3 (three) times daily for 7 days.   OVER THE  COUNTER MEDICATION Take 1 tablet by mouth daily. Pt takes OTC venavit tablet PO daily   pantoprazole 40 MG tablet Commonly known as:  PROTONIX Take 1 tablet (40 mg total) by mouth  daily. Start taking on:  12/20/2017   predniSONE 10 MG tablet Commonly known as:  DELTASONE Take 10 mg by mouth daily.         Diet and Activity recommendation: See Discharge Instructions above   Consults obtained -  GI, renal   Major procedures and Radiology Reports - PLEASE review detailed and final reports for all details, in brief -      Ct Abdomen Pelvis Wo Contrast  Result Date: 12/01/2017 CLINICAL DATA:  Left lower quadrant pain for a few days. EXAM: CT ABDOMEN AND PELVIS WITHOUT CONTRAST TECHNIQUE: Multidetector CT imaging of the abdomen and pelvis was performed following the standard protocol without IV contrast. COMPARISON:  None. FINDINGS: Lower chest: No acute abnormality. Hepatobiliary: No focal liver abnormality is seen. No gallstones, gallbladder wall thickening, or biliary dilatation. Pancreas: Unremarkable. No pancreatic ductal dilatation or surrounding inflammatory changes. Spleen: Normal in size without focal abnormality. Adrenals/Urinary Tract: Adrenal glands are normal. There is a 2.8 cm mass, exophytic off the lower left kidney with peripheral rim-like calcification and a central attenuation of 12 Hounsfield units. There is a mass in the inferior pole the right kidney with a central attenuation of 13 Hounsfield units, consistent with a cyst with peripheral rim-like calcification. Other right renal cysts are noted. There appear to be a few punctate/small stones in both kidneys without hydronephrosis. Mild bilateral perinephric stranding is symmetric with no evidence of acute inflammation. The ureters and bladder are normal in appearance. Stomach/Bowel: The stomach and small bowel are normal. There is fat stranding surrounding the junction of the distal descending and proximal sigmoid colon. There are diverticula in this region. No extraluminal fluid collection. There is extraluminal gas as seen on series 2, image 49 likely due to a micro perforation. No other free air  in the abdomen. Other scattered colonic diverticuli with no other sites of diverticulitis. The remainder of the colon is unremarkable. The appendix is not seen but there is no secondary evidence of appendicitis. Vascular/Lymphatic: Atherosclerotic changes are seen in the nonaneurysmal aorta without adenopathy. Reproductive: Uterus and bilateral adnexa are unremarkable. Other: No additional abnormalities. Musculoskeletal: No acute or significant osseous findings. IMPRESSION: 1. The abnormality at the junction of the distal descending colon and proximal sigmoid colon is most consistent with diverticulitis. A small amount of focal adjacent extraluminal air is consistent with a focal micro perforation. No abscess or pericolonic fluid collections. No other free air. Recommend follow-up to ensure complete resolution. 2. Both kidneys contain and mass which appears cystic centrally with peripheral eggshell like calcification. These are consistent with Bosniak 2 lesions requiring no follow-up. 3. Nonobstructive stones in both kidneys. 4. Atherosclerotic changes in the nonaneurysmal aorta. Electronically Signed   By: Dorise Bullion III M.D   On: 12/01/2017 22:04   Ct Angio Abd/pel W And/or Wo Contrast  Result Date: 12/15/2017 CLINICAL DATA:  Recent diverticulitis, lower GI bleeding EXAM: CT ANGIOGRAPHY ABDOMEN AND PELVIS WITH CONTRAST AND WITHOUT CONTRAST TECHNIQUE: Multidetector CT imaging of the abdomen and pelvis was performed using the standard protocol during bolus administration of intravenous contrast. Multiplanar reconstructed images and MIPs were obtained and reviewed to evaluate the vascular anatomy. CONTRAST:  67mL ISOVUE-370 IOPAMIDOL (ISOVUE-370) INJECTION 76% COMPARISON:  12/01/2017 FINDINGS: VASCULAR Aorta: Mild-to-moderate aortic atherosclerosis without occlusive disease, aneurysm, dissection,  or acute vascular process. Celiac: Remains patent including its branches SMA: Remains patent including its  branches. No evidence of active arterial bleeding or contrast extravasation within the bowel. Renals: Renal arteries remain patent. Accessory right renal artery also patent. IMA: Remains patent off the distal aorta including its branches. No active lower GI arterial bleeding appreciated. Inflow: Iliac vessels remain patent with atherosclerosis and tortuosity. No iliac inflow disease. Common, internal and external iliac arteries are patent. Proximal Outflow: Common femoral, proximal profunda femoral, proximal superficial femoral arteries are also patent. Veins: No veno-occlusive process. Review of the MIP images confirms the above findings. NON-VASCULAR Lower chest: Clear lung bases. Mild cardiac enlargement. No pericardial pleural effusion. Small hiatal hernia noted. No acute lower chest finding. Hepatobiliary: No focal liver abnormality is seen. No gallstones, gallbladder wall thickening, or biliary dilatation. Pancreas: Unremarkable. No pancreatic ductal dilatation or surrounding inflammatory changes. Spleen: Normal in size without focal abnormality. Adrenals/Urinary Tract: No acute adrenal abnormality. Stable hypodense renal cysts some with peripheral wall calcifications. No renal obstruction or hydronephrosis. No hydroureter, ureteral calculus, or obstruction. Urinary bladder is collapsed likely accounting for wall thickening. Stomach/Bowel: Negative for bowel obstruction, significant dilatation, ileus, or free air. Scattered colonic diverticulosis. Appendix not well visualized. No acute inflammatory process in the right lower quadrant. Previous left lower quadrant descending colon diverticulitis shows interval improvement. There is less pericolonic strandy edema/inflammation. Adjacent extraluminal air has resolved. No fluid collection or abscess has developed. No new areas of additional diverticulitis. Lymphatic: No adenopathy. Reproductive: Uterus and adnexa unremarkable. No pelvic free fluid, fluid  collection, hemorrhage or hematoma. No abscess. Other: No abdominal wall hernia or abnormality. No abdominopelvic ascites. Musculoskeletal: Degenerative changes of the spine SI joints. No acute osseous finding. IMPRESSION: VASCULAR Abdominal aortoiliac atherosclerosis without occlusive disease, aneurysm, dissection, or acute vascular process. No evidence of active or acute arterial bleeding within the bowel by CTA. NON-VASCULAR Improving left lower quadrant descending colon diverticulitis as detailed above. No associated obstruction, developing abscess, ascites or free fluid Stable bilateral minimally complex renal cysts Electronically Signed   By: Jerilynn Mages.  Shick M.D.   On: 12/15/2017 12:45    Micro Results     Recent Results (from the past 240 hour(s))  MRSA PCR Screening     Status: None   Collection Time: 12/15/17  5:34 PM  Result Value Ref Range Status   MRSA by PCR NEGATIVE NEGATIVE Final    Comment:        The GeneXpert MRSA Assay (FDA approved for NASAL specimens only), is one component of a comprehensive MRSA colonization surveillance program. It is not intended to diagnose MRSA infection nor to guide or monitor treatment for MRSA infections. Performed at [facility], [place] [address], [place], [place] [address]        Today   Subjective:   Paula Bass today has no headache,no chest or abdominal pain, bowel movements overnight, with no evidence of melena or blood .  Objective:   Blood pressure (!) 146/76, pulse 81, temperature 98.1 F (36.7 C), resp. rate 18, height 5\' 2"  (1.575 m), weight 66.9 kg, SpO2 100 %.   Intake/Output Summary (Last 24 hours) at 12/19/2017 1442 Last data filed at 12/19/2017 1127 Gross per 24 hour  Intake 1847.07 ml  Output -  Net 1847.07 ml    Exam Awake Alert, Oriented x 3, No new F.N deficits, Normal affect Symmetrical Chest wall movement, Good air movement bilaterally, CTAB RRR,No Gallops,Rubs or new Murmurs, No Parasternal  Heave +  ve B.Sounds, Abd Soft, Non tender, No rebound -guarding or rigidity. No Cyanosis, Clubbing or edema, No new Rash or bruise  Data Review   CBC w Diff:  Lab Results  Component Value Date   WBC 12.6 (H) 12/17/2017   HGB 9.1 (L) 12/19/2017   HCT 27.8 (L) 12/19/2017   PLT 120 (L) 12/17/2017    CMP:  Lab Results  Component Value Date   NA 134 (L) 12/17/2017   K 4.7 12/17/2017   CL 103 12/17/2017   CO2 20 (L) 12/17/2017   BUN 47 (H) 12/17/2017   CREATININE 9.19 (H) 12/17/2017   PROT 5.7 (L) 12/15/2017   ALBUMIN 2.8 (L) 12/17/2017   BILITOT 0.5 12/15/2017   ALKPHOS 70 12/15/2017   AST 27 12/15/2017   ALT 18 12/15/2017  .   Total Time in preparing paper work, data evaluation and todays exam - 50 minutes  Phillips Climes M.D on 12/19/2017 at 2:42 PM  [facility]   Office  [phone]

## 2017-12-25 ENCOUNTER — Encounter: Payer: Self-pay | Admitting: Physician Assistant

## 2018-01-14 ENCOUNTER — Ambulatory Visit (INDEPENDENT_AMBULATORY_CARE_PROVIDER_SITE_OTHER): Payer: Medicare (Managed Care) | Admitting: Physician Assistant

## 2018-01-14 ENCOUNTER — Encounter (INDEPENDENT_AMBULATORY_CARE_PROVIDER_SITE_OTHER): Payer: Self-pay

## 2018-01-14 ENCOUNTER — Encounter: Payer: Self-pay | Admitting: Physician Assistant

## 2018-01-14 ENCOUNTER — Telehealth: Payer: Self-pay

## 2018-01-14 ENCOUNTER — Other Ambulatory Visit (INDEPENDENT_AMBULATORY_CARE_PROVIDER_SITE_OTHER): Payer: Medicare (Managed Care)

## 2018-01-14 VITALS — BP 136/88 | HR 81 | Ht 62.0 in | Wt 150.4 lb

## 2018-01-14 DIAGNOSIS — K921 Melena: Secondary | ICD-10-CM

## 2018-01-14 DIAGNOSIS — Z7901 Long term (current) use of anticoagulants: Secondary | ICD-10-CM

## 2018-01-14 DIAGNOSIS — R1032 Left lower quadrant pain: Secondary | ICD-10-CM

## 2018-01-14 DIAGNOSIS — Z8719 Personal history of other diseases of the digestive system: Secondary | ICD-10-CM

## 2018-01-14 LAB — CBC WITH DIFFERENTIAL/PLATELET
Basophils Absolute: 0.1 10*3/uL (ref 0.0–0.1)
Basophils Relative: 1 % (ref 0.0–3.0)
EOS PCT: 1.5 % (ref 0.0–5.0)
Eosinophils Absolute: 0.2 10*3/uL (ref 0.0–0.7)
HCT: 37.4 % (ref 36.0–46.0)
HEMOGLOBIN: 12.1 g/dL (ref 12.0–15.0)
LYMPHS ABS: 2.8 10*3/uL (ref 0.7–4.0)
Lymphocytes Relative: 21 % (ref 12.0–46.0)
MCHC: 32.4 g/dL (ref 30.0–36.0)
MCV: 93 fl (ref 78.0–100.0)
MONOS PCT: 8.4 % (ref 3.0–12.0)
Monocytes Absolute: 1.1 10*3/uL — ABNORMAL HIGH (ref 0.1–1.0)
Neutro Abs: 9 10*3/uL — ABNORMAL HIGH (ref 1.4–7.7)
Neutrophils Relative %: 68.1 % (ref 43.0–77.0)
Platelets: 202 10*3/uL (ref 150.0–400.0)
RBC: 4.02 Mil/uL (ref 3.87–5.11)
RDW: 18 % — AB (ref 11.5–15.5)
WBC: 13.3 10*3/uL — ABNORMAL HIGH (ref 4.0–10.5)

## 2018-01-14 NOTE — Patient Instructions (Signed)
If you are age 46 or older, your body mass index should be between 23-30. Your Body mass index is 27.5 kg/m. If this is out of the aforementioned range listed, please consider follow up with your Primary Care Provider.  If you are age 84 or younger, your body mass index should be between 19-25. Your Body mass index is 27.5 kg/m. If this is out of the aformentioned range listed, please consider follow up with your Primary Care Provider.   You have been scheduled for a CT scan of the abdomen and pelvis at [facility] ([address]---this is in the same building as Press [facility]).   You are scheduled on 02/06/18 at 9 am. You should arrive 15 minutes prior to your appointment time for registration. Please follow the written instructions below on the day of your exam:  WARNING: IF YOU ARE ALLERGIC TO IODINE/X-RAY DYE, PLEASE NOTIFY RADIOLOGY IMMEDIATELY AT [phone]! YOU WILL BE GIVEN A 13 HOUR PREMEDICATION PREP.  1) Do not eat after 5 am (4 hours prior to your test) 2) You have been given 2 bottles of oral contrast to drink. The solution may taste better if refrigerated, but do NOT add ice or any other liquid to this solution. Shake well before drinking.    Drink 1 bottle of contrast @ 7 am (2 hours prior to your exam)  Drink 1 bottle of contrast @ 8 am (1 hour prior to your exam)  You may take any medications as prescribed with a small amount of water, if necessary. If you take any of the following medications: METFORMIN, GLUCOPHAGE, GLUCOVANCE, AVANDAMET, RIOMET, FORTAMET, [place] MET, JANUMET, GLUMETZA or METAGLIP, you MAY be asked to HOLD this medication 48 hours AFTER the exam.  The purpose of you drinking the oral contrast is to aid in the visualization of your intestinal tract. The contrast solution may cause some diarrhea. Depending on your individual set of symptoms, you may also receive an intravenous injection of x-ray contrast/dye. Plan on being at [facility] for 30 minutes or longer, depending on the type of exam you are having performed.  This test typically takes 30-45 minutes to complete.  If you have any questions regarding your exam or if you need to reschedule, you may call the CT department at [phone] between the hours of 8:00 am and 5:00 pm, Monday-Friday.  ________________________________________________________________________ Your provider has requested that you go to the basement level for lab work before leaving today. Press "B" on the elevator. The lab is located at the first door on the left as you exit the elevator.   Will call you with results.  Thank you for choosing me and [facility].  Ellouise Newer, PA-C

## 2018-01-14 NOTE — Telephone Encounter (Signed)
Per Paula Bass - have patient restart Plavix and Aspirin.  Patient verbalized understanding.

## 2018-01-14 NOTE — Progress Notes (Addendum)
Chief Complaint: Follow-up hospitalization for hematochezia and acute blood loss anemia  HPI:    Paula Bass is a 47 year old female, assigned to Dr. Hilarie Fredrickson her recent admission, with a past medical history of end-stage renal disease on hemodialysis, Tuesdays Thursdays and Saturdays, diabetes, lupus and chronic prednisone use as well as others listed below, including chronic anticoagulation with Plavix status post stenting in 2017, who presents to clinic today for follow-up after being seen in the hospital for a large volume hematochezia episode.    Patient was consulted 12/15/2017 for hematochezia and acute blood loss anemia.  She had a recent admission 12/01/2017-12/04/2017 for acute diverticulitis microperforation which was treated with antibiotics.  At that time she presented from dialysis after having a bowel movement with some bright red blood.  Hemoglobin noted to be 9.0, white count 15.9, hemoglobin dropped to 7.5.  Patient had a CT angio which showed no evidence of active or acute arterial bleeding, this did show improving left lower quadrant diverticulitis.  Patient was diagnosed with diverticular bleed.  Discussed she would need a colonoscopy at some point.  Also discussed she would need to be off her Plavix for 5 days prior to time procedure.    Today, patient presents clinic and explains that she finished her antibiotics 2 weeks ago, she was doing well for about a week but over the past week she has had increasing left lower quadrant discomfort rated as a 5/10 which comes and goes and seems to be radiating through to her back today.  This is typically worse before a bowel movement and slightly better afterwards.  Still with some looser than normal stools.      Discusses today that she was told to hold her Plavix and aspirin when discharged from the hospital as they told her that she would "need a colonoscopy soon".  Does describe some subcutaneous ecchymosis which is slightly hard to touch,  notes that her hemoglobin has been 10.5/11 during dialysis recently.    Denies fever, chills, weight loss, anorexia, nausea, vomiting, heartburn, reflux or symptoms that awaken her from sleep.  Past Medical History:  Diagnosis Date  . CAD (coronary artery disease) 2016  . Diabetes mellitus without complication ([place])   . Dialysis patient ([place])   . Diverticulitis   . GI bleeding   . Hypertension   . Lupus [facility])     Past Surgical History:  Procedure Laterality Date  . CARDIAC SURGERY     cardiac shunt    Current Outpatient Medications  Medication Sig Dispense Refill  . amLODipine (NORVASC) 10 MG tablet Take 10 mg by mouth daily.    Marland Kitchen atorvastatin (LIPITOR) 40 MG tablet Take 40 mg by mouth every evening.    . ferrous sulfate 325 (65 FE) MG EC tablet Take 1 tablet (325 mg total) by mouth 2 (two) times daily. 60 tablet 3  . insulin aspart protamine- aspart (NOVOLOG MIX 70/30) (70-30) 100 UNIT/ML injection Inject 30-40 Units into the skin See admin instructions. 40 units in the morning, 30 units at night    . lidocaine-prilocaine (EMLA) cream Apply 1 application topically daily.    . metoprolol tartrate (LOPRESSOR) 25 MG tablet Take 25 mg by mouth 2 (two) times daily.    Marland Kitchen OVER THE COUNTER MEDICATION Take 1 tablet by mouth daily. Pt takes OTC venavit tablet PO daily    . pantoprazole (PROTONIX) 40 MG tablet Take 1 tablet (40 mg total) by mouth daily. 30 tablet 0  . predniSONE (DELTASONE)  10 MG tablet Take 10 mg by mouth daily.    Marland Kitchen aspirin EC 81 MG tablet Take 1 tablet (81 mg total) by mouth daily. Hold for next 2 weeks and resume on 01/02/2018 (Patient not taking: Reported on 01/14/2018)    . clopidogrel (PLAVIX) 75 MG tablet Take 1 tablet (75 mg total) by mouth daily. Hold for next 2 weeks (Patient not taking: Reported on 01/14/2018)     No current facility-administered medications for this visit.     Allergies as of 01/14/2018 - Review Complete 01/14/2018  Allergen Reaction Noted  .  Penicillins Hives 06/20/2017    Family History  Problem Relation Age of Onset  . Colon cancer Neg Hx   . Esophageal cancer Neg Hx   . Breast cancer Neg Hx     Social History   Socioeconomic History  . Marital status: Single    Spouse name: Not on file  . Number of children: Not on file  . Years of education: Not on file  . Highest education level: Not on file  Occupational History  . Occupation: disabled  Social Needs  . Financial resource strain: Not on file  . Food insecurity:    Worry: Not on file    Inability: Not on file  . Transportation needs:    Medical: Not on file    Non-medical: Not on file  Tobacco Use  . Smoking status: Never Smoker  . Smokeless tobacco: Never Used  Substance and Sexual Activity  . Alcohol use: Never    Frequency: Never  . Drug use: Never  . Sexual activity: Not on file  Lifestyle  . Physical activity:    Days per week: Not on file    Minutes per session: Not on file  . Stress: Not on file  Relationships  . Social connections:    Talks on phone: Not on file    Gets together: Not on file    Attends religious service: Not on file    Active member of club or organization: Not on file    Attends meetings of clubs or organizations: Not on file    Relationship status: Not on file  . Intimate partner violence:    Fear of current or ex partner: Not on file    Emotionally abused: Not on file    Physically abused: Not on file    Forced sexual activity: Not on file  Other Topics Concern  . Not on file  Social History Narrative  . Not on file    Review of Systems:    Constitutional: No weight loss, fever or chills Cardiovascular: No chest pain Respiratory: No SOB Gastrointestinal: See HPI and otherwise negative   Physical Exam:  Vital signs: BP 136/88   Pulse 81   Ht 5\' 2"  (1.575 m)   Wt 150 lb 6 oz (68.2 kg)   BMI 27.50 kg/m   Constitutional:   Pleasant AA female appears to be in NAD, Well developed, Well nourished, alert  and cooperative Respiratory: Respirations even and unlabored. Lungs clear to auscultation bilaterally.   No wheezes, crackles, or rhonchi.  Cardiovascular: Normal S1, S2. No MRG. Regular rate and rhythm. No peripheral edema, cyanosis or pallor.  Gastrointestinal:  Soft, nondistended, moderate LLQ ttp, No rebound or guarding. Normal bowel sounds. No appreciable masses or hepatomegaly. Skin: multiple bruises over surface of arms and legs, some hard to touch Psychiatric: Demonstrates good judgement and reason without abnormal affect or behaviors.  RELEVANT LABS AND IMAGING: CBC  Component Value Date/Time   WBC 12.6 (H) 12/17/2017 1941   RBC 2.99 (L) 12/17/2017 1941   HGB 9.1 (L) 12/19/2017 0238   HCT 27.8 (L) 12/19/2017 0238   PLT 120 (L) 12/17/2017 1941   MCV 92.6 12/17/2017 1941   MCH 29.1 12/17/2017 1941   MCHC 31.4 12/17/2017 1941   RDW 16.1 (H) 12/17/2017 1941    CMP     Component Value Date/Time   NA 134 (L) 12/17/2017 1127   K 4.7 12/17/2017 1127   CL 103 12/17/2017 1127   CO2 20 (L) 12/17/2017 1127   GLUCOSE 143 (H) 12/17/2017 1127   BUN 47 (H) 12/17/2017 1127   CREATININE 9.19 (H) 12/17/2017 1127   CALCIUM 8.5 (L) 12/17/2017 1127   PROT 5.7 (L) 12/15/2017 1037   ALBUMIN 2.8 (L) 12/17/2017 1127   AST 27 12/15/2017 1037   ALT 18 12/15/2017 1037   ALKPHOS 70 12/15/2017 1037   BILITOT 0.5 12/15/2017 1037   GFRNONAA 5 (L) 12/17/2017 1127   GFRAA 5 (L) 12/17/2017 1127    Assessment: 1.  History of diverticulitis, now with repeat left lower quadrant pain: Consider continue diverticulitis 2.  Recent hematochezia: During hospitalization, thought diverticular origin 3.  Chronic anticoagulation status post stent in 2017: Plavix and aspirin, on hold since discharge from hospital 12/17/2017  Plan: 1.  Ordered repeat CBC today. 2.  Ordered repeat CT abdomen pelvis with contrast.  If this shows continued diverticulitis would recommend another 2 weeks of antibiotics, likely  Moxifloxacin.  Then,, a week following that will proceed with colonoscopy.  If this is negative then we will schedule the patient for a colonoscopy now. 3.  Per GI standpoint there is no reason that the patient needs to be off of her Plavix right now.  She will need to hold this for 5 days prior to time of any scheduled procedure, but given that this may be a few weeks from now, she should likely restart this.  Will discuss with her primary care provider Raelyn Number, PA.  Did explain to the patient that if anyone restarts her Plavix she should let us know. 4.  Case was discussed with Dr. Hilarie Fredrickson at time of patient's visit.  Patient will follow in clinic per recommendations after labs and imaging.  Ellouise Newer, PA-C [facility] 01/14/2018, 10:03 AM  Cc: No ref. provider found   Addendum: Reviewed and agree with assessment and management plan. Pyrtle, Lajuan Lines, MD

## 2018-02-06 ENCOUNTER — Ambulatory Visit (INDEPENDENT_AMBULATORY_CARE_PROVIDER_SITE_OTHER)
Admission: RE | Admit: 2018-02-06 | Discharge: 2018-02-06 | Disposition: A | Discharge status: Home / Self Care | Payer: Medicare (Managed Care) | Source: Ambulatory Visit | Attending: Physician Assistant | Admitting: Physician Assistant

## 2018-02-06 DIAGNOSIS — Z8719 Personal history of other diseases of the digestive system: Secondary | ICD-10-CM

## 2018-02-06 DIAGNOSIS — R1032 Left lower quadrant pain: Secondary | ICD-10-CM | POA: Diagnosis not present

## 2018-02-06 MED ORDER — IOPAMIDOL (ISOVUE-300) INJECTION 61%
100.0000 mL | Freq: Once | INTRAVENOUS | Status: AC | PRN
  Administered 2018-02-06: 100 mL via INTRAVENOUS

## 2018-02-07 ENCOUNTER — Other Ambulatory Visit: Payer: Self-pay

## 2018-02-07 MED ORDER — MOXIFLOXACIN HCL 400 MG PO TABS: 400.0000 mg | ORAL_TABLET | Freq: Every day | ORAL | 0 refills | Status: AC

## 2018-02-12 ENCOUNTER — Emergency Department (HOSPITAL_COMMUNITY)
Admission: EM | Admit: 2018-02-12 | Discharge: 2018-02-12 | Disposition: A | Discharge status: Home / Self Care | Payer: Medicare (Managed Care) | Attending: Emergency Medicine | Admitting: Emergency Medicine

## 2018-02-12 ENCOUNTER — Encounter (HOSPITAL_COMMUNITY): Payer: Self-pay

## 2018-02-12 ENCOUNTER — Emergency Department (HOSPITAL_COMMUNITY): Payer: Medicare (Managed Care)

## 2018-02-12 DIAGNOSIS — E1122 Type 2 diabetes mellitus with diabetic chronic kidney disease: Secondary | ICD-10-CM | POA: Insufficient documentation

## 2018-02-12 DIAGNOSIS — Z7902 Long term (current) use of antithrombotics/antiplatelets: Secondary | ICD-10-CM | POA: Diagnosis not present

## 2018-02-12 DIAGNOSIS — Z794 Long term (current) use of insulin: Secondary | ICD-10-CM | POA: Diagnosis not present

## 2018-02-12 DIAGNOSIS — R05 Cough: Secondary | ICD-10-CM | POA: Insufficient documentation

## 2018-02-12 DIAGNOSIS — I120 Hypertensive chronic kidney disease with stage 5 chronic kidney disease or end stage renal disease: Secondary | ICD-10-CM | POA: Diagnosis not present

## 2018-02-12 DIAGNOSIS — Z955 Presence of coronary angioplasty implant and graft: Secondary | ICD-10-CM | POA: Diagnosis not present

## 2018-02-12 DIAGNOSIS — Z79899 Other long term (current) drug therapy: Secondary | ICD-10-CM | POA: Diagnosis not present

## 2018-02-12 DIAGNOSIS — R0602 Shortness of breath: Secondary | ICD-10-CM | POA: Diagnosis not present

## 2018-02-12 DIAGNOSIS — R0789 Other chest pain: Secondary | ICD-10-CM | POA: Insufficient documentation

## 2018-02-12 DIAGNOSIS — Z992 Dependence on renal dialysis: Secondary | ICD-10-CM | POA: Insufficient documentation

## 2018-02-12 DIAGNOSIS — Z7982 Long term (current) use of aspirin: Secondary | ICD-10-CM | POA: Diagnosis not present

## 2018-02-12 DIAGNOSIS — N186 End stage renal disease: Secondary | ICD-10-CM | POA: Insufficient documentation

## 2018-02-12 HISTORY — DX: Disorder of kidney and ureter, unspecified: N28.9

## 2018-02-12 LAB — BASIC METABOLIC PANEL
Anion gap: 15 (ref 5–15)
BUN: 21 mg/dL — AB (ref 6–20)
CHLORIDE: 90 mmol/L — AB (ref 98–111)
CO2: 27 mmol/L (ref 22–32)
Calcium: 8.6 mg/dL — ABNORMAL LOW (ref 8.9–10.3)
Creatinine, Ser: 5.52 mg/dL — ABNORMAL HIGH (ref 0.44–1.00)
GFR calc Af Amer: 10 mL/min — ABNORMAL LOW (ref >60)
GFR calc non Af Amer: 9 mL/min — ABNORMAL LOW (ref >60)
Glucose, Bld: 431 mg/dL — ABNORMAL HIGH (ref 70–99)
POTASSIUM: 4.6 mmol/L (ref 3.5–5.1)
SODIUM: 132 mmol/L — AB (ref 135–145)

## 2018-02-12 LAB — CBC
HEMATOCRIT: 33.7 % — AB (ref 36.0–46.0)
HEMOGLOBIN: 10.4 g/dL — AB (ref 12.0–15.0)
MCH: 29 pg (ref 26.0–34.0)
MCHC: 30.9 g/dL (ref 30.0–36.0)
MCV: 93.9 fL (ref 80.0–100.0)
NRBC: 0 % (ref 0.0–0.2)
Platelets: 226 10*3/uL (ref 150–400)
RBC: 3.59 MIL/uL — ABNORMAL LOW (ref 3.87–5.11)
RDW: 15.6 % — ABNORMAL HIGH (ref 11.5–15.5)
WBC: 10.7 10*3/uL — AB (ref 4.0–10.5)

## 2018-02-12 LAB — I-STAT BETA HCG BLOOD, ED (MC, WL, AP ONLY)

## 2018-02-12 LAB — I-STAT TROPONIN, ED
TROPONIN I, POC: 0 ng/mL (ref 0.00–0.08)
Troponin i, poc: 0.01 ng/mL (ref 0.00–0.08)

## 2018-02-12 NOTE — ED Notes (Signed)
Patient verbalizes understanding of medications and discharge instructions. No further questions at this time. VSS and patient ambulatory at discharge.   

## 2018-02-12 NOTE — Discharge Instructions (Signed)
Continue your home medications as previously prescribed. You will need to follow-up with your primary care provider and the cardiology office listed below for further evaluation. Return to ED for worsening symptoms, increased shortness of breath, leg swelling, vomiting or coughing up blood.

## 2018-02-12 NOTE — ED Provider Notes (Signed)
[facility] EMERGENCY DEPARTMENT Provider Note   CSN: [id number] Arrival date & time: 02/12/18  1826     History   Chief Complaint Chief Complaint  Patient presents with  . Chest Pain    HPI Paula Bass is a 47 y.o. female with a past medical history of dialysis Tuesdays Thursdays and Saturdays, CAD status post stent placement in 2017 currently taking aspirin and Plavix, who presents to ED for 5-day history of left-sided/central chest pain.  She also reports shortness of breath worse at night when she lays down.  She completed her dialysis session fully today with no improvement in her symptoms.  She reports an associated dry cough but denies any fever, other URI symptoms.  She has not yet established care with a cardiologist in [place] after moving into the area 3 months ago.  Denies any back pain, injuries or falls, hemoptysis, leg swelling, recent immobilization, history of PE or DVT.  HPI  Past Medical History:  Diagnosis Date  . CAD (coronary artery disease) 2016  . Diabetes mellitus without complication ([place])   . Dialysis patient ([place])   . Diverticulitis   . GI bleeding   . Hypertension   . Lupus ([place])   . Renal disorder    dialysis    Patient Active Problem List   Diagnosis Date Noted  . LGI bleed 12/16/2017  . Blood loss anemia   . Diverticulitis of colon   . Hematochezia 12/15/2017  . Lower GI bleed   . Acute diverticulitis 12/01/2017  . ESRD (end stage renal disease) ([place]) 12/01/2017  . DM2 (diabetes mellitus, type 2) ([place]) 12/01/2017  . Lupus ([place]) 12/01/2017  . Benign essential HTN 12/01/2017  . UTI (urinary tract infection) 12/01/2017    Past Surgical History:  Procedure Laterality Date  . CARDIAC SURGERY     cardiac shunt  . Kidney graft Left      OB History   No obstetric history on file.      Home Medications    Prior to Admission medications   Medication Sig Start Date End Date Taking? Authorizing Provider    amLODipine (NORVASC) 10 MG tablet Take 10 mg by mouth daily. 11/23/17  Yes [provider]  aspirin EC 81 MG tablet Take 1 tablet (81 mg total) by mouth daily. Hold for next 2 weeks and resume on 01/02/2018 Patient taking differently: Take 81 mg by mouth daily.  01/02/18  Yes Elgergawy, Silver Huguenin, MD  atorvastatin (LIPITOR) 40 MG tablet Take 40 mg by mouth daily.  10/25/17  Yes [provider]  clopidogrel (PLAVIX) 75 MG tablet Take 1 tablet (75 mg total) by mouth daily. Hold for next 2 weeks Patient taking differently: Take 75 mg by mouth daily.  01/02/18  Yes Elgergawy, Silver Huguenin, MD  insulin lispro (HUMALOG) 100 UNIT/ML injection Inject 30-40 Units into the skin See admin instructions. Inject 40 units into the skin before breakfast and 30 units at bedtime   Yes [provider]  lidocaine-prilocaine (EMLA) cream Apply 1 application topically Every Tuesday,Thursday,and Saturday with dialysis.  11/18/17  Yes [provider]  metoprolol tartrate (LOPRESSOR) 25 MG tablet Take 25 mg by mouth 2 (two) times daily. 11/23/17  Yes [provider]  moxifloxacin (AVELOX) 400 MG tablet Take 1 tablet (400 mg total) by mouth daily at 8 pm for 14 days. 02/07/18 02/21/18 Yes Levin Erp, PA  multivitamin (RENA-VIT) TABS tablet Take 1 tablet by mouth daily.   Yes [provider]  predniSONE (DELTASONE) 10 MG tablet Take 10 mg by mouth daily. 11/03/17  Yes [provider]  ferrous sulfate 325 (65 FE) MG EC tablet Take 1 tablet (325 mg total) by mouth 2 (two) times daily. Patient not taking: Reported on 02/12/2018 12/19/17 12/19/18  Elgergawy, Silver Huguenin, MD  pantoprazole (PROTONIX) 40 MG tablet Take 1 tablet (40 mg total) by mouth daily. Patient not taking: Reported on 02/12/2018 12/20/17   Elgergawy, Silver Huguenin, MD    Family History Family History  Problem Relation Age of Onset  . Colon cancer Neg Hx   . Esophageal cancer Neg Hx   . Breast cancer  Neg Hx     Social History Social History   Tobacco Use  . Smoking status: Never Smoker  . Smokeless tobacco: Never Used  Substance Use Topics  . Alcohol use: Never    Frequency: Never  . Drug use: Never     Allergies   Penicillins   Review of Systems Review of Systems  Constitutional: Negative for appetite change, chills and fever.  HENT: Negative for ear pain, rhinorrhea, sneezing and sore throat.   Eyes: Negative for photophobia and visual disturbance.  Respiratory: Positive for shortness of breath. Negative for cough, chest tightness and wheezing.   Cardiovascular: Positive for chest pain. Negative for palpitations.  Gastrointestinal: Negative for abdominal pain, blood in stool, constipation, diarrhea, nausea and vomiting.  Genitourinary: Negative for dysuria, hematuria and urgency.  Musculoskeletal: Negative for myalgias.  Skin: Negative for rash.  Neurological: Negative for dizziness, weakness and light-headedness.     Physical Exam Updated Vital Signs BP 132/79   Pulse 81   Temp 97.7 F (36.5 C) (Oral)   Resp 15   LMP 01/30/2018   SpO2 100%   Physical Exam Vitals signs and nursing note reviewed.  Constitutional:      General: She is not in acute distress.    Appearance: She is well-developed.  HENT:     Head: Normocephalic and atraumatic.     Nose: Nose normal.  Eyes:     General: No scleral icterus.       Left eye: No discharge.     Conjunctiva/sclera: Conjunctivae normal.  Neck:     Musculoskeletal: Normal range of motion and neck supple.  Cardiovascular:     Rate and Rhythm: Normal rate and regular rhythm.     Heart sounds: Normal heart sounds. No murmur. No friction rub. No gallop.   Pulmonary:     Effort: Pulmonary effort is normal. No respiratory distress.     Breath sounds: Normal breath sounds.  Chest:     Chest wall: Tenderness present.    Abdominal:     General: Bowel sounds are normal. There is no distension.     Palpations:  Abdomen is soft.     Tenderness: There is no abdominal tenderness. There is no guarding.  Musculoskeletal: Normal range of motion.     Comments: No lower extremity edema, erythema or calf tenderness bilaterally.  Skin:    General: Skin is warm and dry.     Findings: No rash.  Neurological:     Mental Status: She is alert.     Motor: No abnormal muscle tone.     Coordination: Coordination normal.      ED Treatments / Results  Labs (all labs ordered are listed, but only abnormal results are displayed) Labs Reviewed  BASIC METABOLIC PANEL - Abnormal; Notable for the following components:  Result Value   Sodium 132 (*)    Chloride 90 (*)    Glucose, Bld 431 (*)    BUN 21 (*)    Creatinine, Ser 5.52 (*)    Calcium 8.6 (*)    GFR calc non Af Amer 9 (*)    GFR calc Af Amer 10 (*)    All other components within normal limits  CBC - Abnormal; Notable for the following components:   WBC 10.7 (*)    RBC 3.59 (*)    Hemoglobin 10.4 (*)    HCT 33.7 (*)    RDW 15.6 (*)    All other components within normal limits  I-STAT TROPONIN, ED  I-STAT BETA HCG BLOOD, ED ([facility], [facility], [facility] ONLY)  I-STAT TROPONIN, ED    EKG EKG Interpretation  Date/Time:  Tuesday February 12 2018 18:35:04 EST Ventricular Rate:  88 PR Interval:  176 QRS Duration: 72 QT Interval:  402 QTC Calculation: 486 R Axis:   -8 Text Interpretation:  Normal sinus rhythm T wave abnormality Abnormal ekg Confirmed by Carmin Muskrat [phone]) on 02/12/2018 6:38:28 PM   Radiology Dg Chest 2 View  Result Date: 02/12/2018 CLINICAL DATA:  Chest pain EXAM: CHEST - 2 VIEW COMPARISON:  None. FINDINGS: Cardiac shadow is enlarged. The lungs are well aerated bilaterally. Healed rib fractures are noted bilaterally. No focal infiltrate or sizable effusion is seen. Prior stenting in the left arm is noted. Diverticular change in the abdomen is seen. IMPRESSION: No acute abnormality noted. Electronically Signed   By: Inez Catalina M.D.    On: 02/12/2018 19:54    Procedures Procedures (including critical care time)  Medications Ordered in ED Medications - No data to display   Initial Impression / Assessment and Plan / ED Course  I have reviewed the triage vital signs and the nursing notes.  Pertinent labs & imaging results that were available during my care of the patient were reviewed by me and considered in my medical decision making (see chart for details).     47 year old female with a past medical history of ESRD on dialysis Tuesdays, Thursdays, Saturdays, CAD status post stent placement in 2017 currently taking aspirin and Plavix presents to ED for 5-day history of left-sided/central chest pain.  She also reports shortness of breath.  She has been completing her dialysis sessions fully with no improvement in her symptoms.  She reports associated dry cough but denies any other URI symptoms.  She has not yet established care with a cardiologist in [place] after moving to the area 3 months ago.  Denies any recent immobilization, pleuritic chest pain, hemoptysis, history of DVT or PE.  On exam patient is overall well-appearing.  Her chest pain is reproducible with palpation.  No lower extremity edema, erythema or calf tenderness that would concern me for DVT.  Chest x-ray is unremarkable.  EKG shows normal sinus rhythm with no ischemic changes.  BMP shows creatinine of 5 which is consistent with her history of ESRD.  Normal potassium noted.  CBC is unremarkable.  Initial and delta troponin are both negative.  hCG is unremarkable.  Bedside ultrasound shows no signs of pericardial effusion.  I spoke to cardiologist regarding this patient and they feel that due to her unremarkable EKG, if delta troponin is negative then she is suitable for discharge and outpatient cardiology follow-up.  Patient is agreeable to this plan.  We will give her contact information for cardiology follow-up and have her follow-up with her PCP as  well.   Patient counseled on importance of medication compliance.  Patient is hemodynamically stable, in NAD, and able to ambulate in the ED. Evaluation does not show pathology that would require ongoing emergent intervention or inpatient treatment. I explained the diagnosis to the patient. Pain has been managed and has no complaints prior to discharge. Patient is comfortable with above plan and is stable for discharge at this time. All questions were answered prior to disposition. Strict return precautions for returning to the ED were discussed. Encouraged follow up with PCP.    Portions of this note were generated with [employer]. Dictation errors may occur despite best attempts at proofreading.   Final Clinical Impressions(s) / ED Diagnoses   Final diagnoses:  Chest wall pain    ED Discharge Orders    None       Delia Heady, PA-C 02/12/18 2351    Blanchie Dessert, MD 02/20/18 [phone]

## 2018-02-12 NOTE — ED Triage Notes (Signed)
Pt c.o left sided chest pain 5 days ago. SOB at night when she lays down. Dialysis T TH Sat and pt received full treatment today. Pt a.o, nad noted in triage

## 2018-03-01 ENCOUNTER — Telehealth: Payer: Self-pay | Admitting: *Deleted

## 2018-03-01 DIAGNOSIS — M792 Neuralgia and neuritis, unspecified: Secondary | ICD-10-CM | POA: Diagnosis not present

## 2018-03-01 DIAGNOSIS — M79672 Pain in left foot: Secondary | ICD-10-CM | POA: Diagnosis not present

## 2018-03-01 DIAGNOSIS — M79671 Pain in right foot: Secondary | ICD-10-CM | POA: Diagnosis not present

## 2018-03-01 NOTE — Telephone Encounter (Signed)
Great- Thank you Dr Hilarie Fredrickson.

## 2018-03-01 NOTE — Telephone Encounter (Signed)
Dr Hilarie Fredrickson,  This pt is scheduled for a colon with you 03-11-2018. She is on Plavix.  She had an OV with Ellouise Newer 01-14-2018 and a colon was discussed.  In the note it says will hold Plavix for 5 days how ever there is no hold from the prescribing MD.  Pt is on dialysis, she had been admitted for an acute diverticulitis prior to her OV with Anderson [place].  Do we need to get a hold from the prescribing MD ?   Does she need another OV?  Please advise, Thanks so much,Marie

## 2018-03-01 NOTE — Telephone Encounter (Signed)
No worries, I will send letter.

## 2018-03-01 NOTE — Telephone Encounter (Signed)
Paula Bass- Were you working on getting Plavix hold for this patient seen on 01/14/18? Looks like patient was holding at date of visit but then was called and told to restart med again per [place]. I just dont see anything about clearance??

## 2018-03-01 NOTE — Telephone Encounter (Signed)
More information has been ascertained --Patient has now resumed Plavix, having been off of it at the time she met with Ellouise Newer, PA-C -prescribing provider for Plavix was originally in [place], and most recent prescription came from hospitalization thus it is difficult to get clearance from prescribing provider --Also it should be noted that when last seen the patient had ongoing diverticulitis and was given 2 weeks of moxifloxacin and colonoscopy schedule III weeks thereafter --At this point given the above issues I think best for the patient to come back to clinic to meet with me or advanced practitioner, Ellouise Newer, PA-C to ensure diverticulitis is better, ensure appropriateness for colonoscopy and to determine whom will take responsibility for her clopidogrel

## 2018-03-01 NOTE — Telephone Encounter (Signed)
Dottie, looked into this.  On 01/14/18 a CT was ordered no mention on [facility] sheet of a procedure.  But I will be happy to send a letter out.  Looks like procedure was scheduled on 02/07/18 by Precious Bard.  Thanks

## 2018-03-01 NOTE — Telephone Encounter (Signed)
We do need permission from prescribing MD I am okay without OV if we can get this info from necessary provider I will CC: Dottie for help

## 2018-03-04 ENCOUNTER — Encounter: Payer: Self-pay | Admitting: Advanced Practice Midwife

## 2018-03-04 ENCOUNTER — Ambulatory Visit (INDEPENDENT_AMBULATORY_CARE_PROVIDER_SITE_OTHER): Payer: Medicare (Managed Care) | Admitting: Advanced Practice Midwife

## 2018-03-04 ENCOUNTER — Other Ambulatory Visit (HOSPITAL_COMMUNITY)
Admission: RE | Admit: 2018-03-04 | Discharge: 2018-03-04 | Disposition: A | Discharge status: Home / Self Care | Payer: Medicare (Managed Care) | Source: Ambulatory Visit | Attending: Advanced Practice Midwife | Admitting: Advanced Practice Midwife

## 2018-03-04 ENCOUNTER — Other Ambulatory Visit: Payer: Self-pay | Admitting: Advanced Practice Midwife

## 2018-03-04 VITALS — BP 155/93 | HR 88 | Ht 61.0 in | Wt 150.0 lb

## 2018-03-04 DIAGNOSIS — B372 Candidiasis of skin and nail: Secondary | ICD-10-CM

## 2018-03-04 DIAGNOSIS — Z124 Encounter for screening for malignant neoplasm of cervix: Secondary | ICD-10-CM | POA: Insufficient documentation

## 2018-03-04 DIAGNOSIS — B373 Candidiasis of vulva and vagina: Secondary | ICD-10-CM

## 2018-03-04 DIAGNOSIS — Z1231 Encounter for screening mammogram for malignant neoplasm of breast: Secondary | ICD-10-CM

## 2018-03-04 DIAGNOSIS — Z1239 Encounter for other screening for malignant neoplasm of breast: Secondary | ICD-10-CM

## 2018-03-04 DIAGNOSIS — Z01419 Encounter for gynecological examination (general) (routine) without abnormal findings: Secondary | ICD-10-CM

## 2018-03-04 DIAGNOSIS — B3731 Acute candidiasis of vulva and vagina: Secondary | ICD-10-CM

## 2018-03-04 MED ORDER — NYSTATIN 100000 UNIT/GM EX POWD: Freq: Four times a day (QID) | CUTANEOUS | 3 refills | Status: DC

## 2018-03-04 MED ORDER — TERCONAZOLE 0.4 % VA CREA: 1.0000 | TOPICAL_CREAM | Freq: Every day | VAGINAL | 3 refills | Status: DC

## 2018-03-04 NOTE — Telephone Encounter (Signed)
Attempted pt- Paula Bass to call our office so I can make her an OV per Dr Hilarie Fredrickson

## 2018-03-04 NOTE — Patient Instructions (Signed)
Pelvic Exam A pelvic exam is an exam of a woman's outer and inner genitals and reproductive organs. Pelvic exams are done to screen for health problems and to help prevent health problems from developing. During your pelvic exam, your health care provider may ask you questions about your health, your family's health, your menstrual periods, immunizations, and your sexual activity. The information shared between you and your health care provider will not be shared with anyone else. Who should have a pelvic exam? Talk with your health care provider about when and how often you should have a pelvic exam. There are many possible reasons you might have a pelvic exam. A pelvic exam may be recommended to check for:  Normal development and function of the reproductive organs.  Cancer of the cervix, ovaries, uterus, or vagina.  Signs of sexually transmitted infections (STIs) or other types of infections.  Pregnancy. If you are pregnant, a pelvic exam can also help determine how far along you are in your pregnancy.  Widening (dilation) of the cervix during labor.  Injury (trauma) to the reproductive organs. How is a pelvic exam done? Usually, a physical exam is done first. This may include:  An exam of your breasts. Your health care provider may feel your breasts to check for abnormalities.  An exam of your abdomen. Your health care provider may press on your abdomen to check for abnormalities. Pelvic exams may vary among health care providers and hospitals. The following things are usually done during a pelvic exam:  You will remove your clothes from the waist down. You will put on a gown or a wrap to cover yourself while you get ready for the exam.  You will lie on your back on an examination table. Your feet will be placed into foot rests (stirrups) so that your legs are wide apart and your knees are bent. A drape will be placed over your abdomen and your legs.  Your health care provider will  wear gloves and examine your outer genitals to check for anything unusual. This includes your clitoris, urethra, vaginal opening, labia, and the skin between your vagina and your anus (perineum).  Your health care provider will examine your inner genitals. To do this, a lubricated instrument (speculum) will be inserted into your vagina. The speculum will be widened to open the walls of your vagina. ? Your health care provider will examine your vagina and cervix. ? A Pap test, cervical biopsy, or cultures may be done as needed. ? After the internal exam is done, the speculum will be removed.  Your health care provider will insert two fingers into your vagina to gently press against various organs. ? Your health care provider may use his or her other hand to gently press on your lower abdomen while doing this. Depending on the purpose of your pelvic exam, your health care provider may perform:  A Pap test. This is sometimes called a Pap smear. It is a screening test that is used to check for signs of cancer of the cervix. The test can also identify the presence of infection or precancerous changes.  A cervical biopsy. This is the removal of a small sample of tissue from the cervix. The cervix is the lowest part of the womb (uterus), which opens into the vagina (birth canal). The tissue will be checked under a microscope.  Other diagnostic tests that involve taking samples of tissue or fluid (cultures).  If you have tests done, it is your responsibility to  get your test results. Ask your health care provider or the department performing the test when your results will be ready. What are the benefits of a pelvic exam? A pelvic exam may be recommended to help explain or diagnose:  Changes in your body that may be signs of cancer in the reproductive system.  Inability to get pregnant (infertility).  Vaginal itching or burning.  Abnormal vaginal discharge or bleeding.  Problems with sexual  function.  Problems with urination, such as: ? Painful urination. ? Frequent urinary tract infections. ? Inability to control when you urinate (urinary incontinence).  Problems with menstrual periods, such as: ? Severe cramping. ? Absence of any menstrual flow in a female by the age of 25 years (primary amenorrhea). ? Absence of menstrual flow for 3-6 months at a time (secondary amenorrhea).  Problems with the position of your pelvic organs due to weakening muscles (prolapse), such as: ? Uterine prolapse. ? Bladder prolapse. ? Rectal prolapse. What are the risks of a pelvic exam?  A pelvic exam is usually painless, although it can cause mild discomfort. If you experience pain at any time during your pelvic exam, tell your health care provider right away.  You may have very light bleeding after a pelvic exam, particularly if a biopsy or cultures were obtained. When should I seek medical care? Seek medical care after your pelvic exam if:  You develop new symptoms.  You experience pain or discomfort from anything that was done during your pelvic exam. Summary  A pelvic exam is an exam of a woman's outer and inner genitals and reproductive organs.  A pelvic exam may be recommended to help explain or diagnose various problems with your pelvic organs.  You may experience mild discomfort during a pelvic exam. This information is not intended to replace advice given to you by your health care provider. Make sure you discuss any questions you have with your health care provider. Document Released: 05/06/2002 Document Revised: 07/03/2017 Document Reviewed: 07/03/2017 Elsevier Interactive Patient Education  2019 [employer].

## 2018-03-04 NOTE — Telephone Encounter (Signed)
Spoke with pt- PV cancelled for [facility] 1-8 Pt scheduled for an OV with [facility] as per Dr Hilarie Fredrickson 1-8 at 215 pm-  I left the colon on the schedule for 1-13- If pt cannot have this it will need to be cancelled at the [facility] 1-8.  Lelan Pons PV

## 2018-03-04 NOTE — Progress Notes (Signed)
NGYN pt from [place] presents for Annual Exam today.  CC: NONE   Last pap: last year per pt WNL Mammogram: WNL last year STD Screening : Declined LMP: 01/21/2018

## 2018-03-04 NOTE — Progress Notes (Signed)
Subjective:     Paula Bass is a 48 y.o. female with medical hx significant for HTN, Lupus, ESRD on dialysis here for a routine gyn exam.  Current complaints: Occasional vaginal discharge with itching, frequent yeast due to DM/dialysis and itching under right breast. Pt is sexually active although rare and uses condoms most of the time.  Menses are infrequent and light/moderate in amount when they occur.  She has primary care provider and specialty care providers.  Her kidney specialists request an up to date Pap.  Personal health questionnaire reviewed: yes.   Gynecologic History Patient's last menstrual period was 01/21/2018 (approximate). Contraception: condoms Last Pap: 2019. Results were: normal Last mammogram: 2019. Results were: normal  Obstetric History OB History  Gravida Para Term Preterm AB Living  2       1 1   SAB TAB Ectopic Multiple Live Births    1          # Outcome Date GA Lbr Len/2nd Weight Sex Delivery Anes PTL Lv  2 Gravida           1 TAB              The following portions of the patient's history were reviewed and updated as appropriate: allergies, current medications, past family history, past medical history, past social history, past surgical history and problem list.  Review of Systems Pertinent items noted in HPI and remainder of comprehensive ROS otherwise negative.    Objective:   BP (!) 155/93   Pulse 88   Ht 5\' 1"  (1.549 m)   Wt 68 kg   LMP 01/21/2018 (Approximate)   BMI 28.34 kg/m    VS reviewed, nursing note reviewed,  Constitutional: well developed, well nourished, no distress HEENT: normocephalic CV: normal rate Pulm/chest wall: normal effort Breast Exam:  right breast normal without mass, skin or nipple changes or axillary nodes, left breast normal without mass, skin or nipple changes or axillary nodes Abdomen: soft Neuro: alert and oriented x 3 Skin: warm, dry Psych: affect normal Pelvic exam: Cervix pink, visually closed,  without lesion, small amount thick white discharge, vaginal walls and external genitalia normal Bimanual exam: Cervix 0/long/high, firm, anterior, neg CMT, uterus nontender, nonenlarged, adnexa without tenderness, enlargement, or mass  Assessment:    1. Encounter for annual routine gynecological examination --Doing well, sexually active without problems. Recurrent yeast infection due to dialysis/DM.  2. Screening for cervical cancer - Cytology - PAP( [place])  3. Vaginal candidiasis  --On exam and pt with symptoms, swab results pending. --Unsure of Diflucan with pt meds/health conditions. Pt has never taken before. - terconazole (TERAZOL 7) 0.4 % vaginal cream; Place 1 applicator vaginally at bedtime.  Dispense: 45 g; Refill: 3  4. Skin candidiasis --Erythema, scaling noted under right breast, c/w yeast. - nystatin (MYCOSTATIN/NYSTOP) powder; Apply topically 4 (four) times daily.  Dispense: 15 g; Refill: 3  5. Breast cancer screening by mammogram  - MM DIGITAL SCREENING BILATERAL; Future   .

## 2018-03-05 NOTE — Addendum Note (Signed)
Addended by: Courtney Heys on: 03/05/2018 09:44 AM   Modules accepted: Orders

## 2018-03-06 ENCOUNTER — Ambulatory Visit (INDEPENDENT_AMBULATORY_CARE_PROVIDER_SITE_OTHER): Payer: Medicare (Managed Care) | Admitting: Physician Assistant

## 2018-03-06 ENCOUNTER — Encounter: Payer: Self-pay | Admitting: Physician Assistant

## 2018-03-06 VITALS — BP 108/68 | HR 98 | Ht 61.0 in | Wt 145.0 lb

## 2018-03-06 DIAGNOSIS — Z7901 Long term (current) use of anticoagulants: Secondary | ICD-10-CM

## 2018-03-06 DIAGNOSIS — K5732 Diverticulitis of large intestine without perforation or abscess without bleeding: Secondary | ICD-10-CM

## 2018-03-06 DIAGNOSIS — K921 Melena: Secondary | ICD-10-CM

## 2018-03-06 DIAGNOSIS — R142 Eructation: Secondary | ICD-10-CM

## 2018-03-06 DIAGNOSIS — R1032 Left lower quadrant pain: Secondary | ICD-10-CM | POA: Diagnosis not present

## 2018-03-06 LAB — CERVICOVAGINAL ANCILLARY ONLY
Bacterial vaginitis: NEGATIVE
Candida vaginitis: POSITIVE — AB

## 2018-03-06 MED ORDER — PANTOPRAZOLE SODIUM 40 MG PO TBEC: 40.0000 mg | DELAYED_RELEASE_TABLET | Freq: Every day | ORAL | 0 refills | Status: DC

## 2018-03-06 MED ORDER — HYOSCYAMINE SULFATE 0.125 MG SL SUBL: 0.1250 mg | SUBLINGUAL_TABLET | Freq: Four times a day (QID) | SUBLINGUAL | 2 refills | Status: DC | PRN

## 2018-03-06 MED ORDER — PANTOPRAZOLE SODIUM 40 MG PO TBEC: 40.0000 mg | DELAYED_RELEASE_TABLET | Freq: Every day | ORAL | 6 refills | Status: DC

## 2018-03-06 NOTE — Patient Instructions (Signed)
We have sent the following medications to your pharmacy for you to pick up at your convenience: Hyocyomine, Pantoprazole  We will cancel your colonoscopy scheduled for 03/11/18 until we can get clearance for holding your Plavix.  We will call you to reschedule once we have done this.

## 2018-03-06 NOTE — Progress Notes (Addendum)
Chief Complaint: Follow-up left lower quadrant pain  HPI:    Paula Bass is a 48 year old AA female, with a past medical history of end-stage renal dialysis on hemodialysis Tuesday/Thursday/Saturday, diabetes, lupus and chronic Prednisone use as well as others listed below including chronic anticoagulation with Plavix status post stenting in 2017, known to Dr. Hilarie Fredrickson, who presents to clinic today for follow-up of her left lower quadrant pain.    01/14/2018 office visit with me for follow-up after hospital visit for large volume hematochezia.  A CT at that time it showed left lower quadrant diverticulitis.  Patient presented to clinic and had finished her antibiotics 2 weeks ago and was doing well for about a week but over the past week she had increasing left lower quadrant discomfort.  Repeat CT abdomen pelvis was ordered this showed continued diverticulitis and patient was given Moxifloxacin for 2 weeks.  It was recommended she have a colonoscopy a week following antibiotics.  It was discussed she would need to be off of her Plavix for 5 days prior to time of any scheduled procedure but given the unknown schedule for this she was told to restart at that office visit.    03/01/2018 phone call with the patient she is currently scheduled for colonoscopy 03/11/2018.  Discussed patient would need return office visit to discuss left lower quadrant pain as well as get Plavix clearance prior to procedure.    Today, patient presents clinic accompanied by her husband and explains that she finished her antibiotics about a week ago.  She does continue with very intermittent left lower quadrant pain which she describes as a 6-7/10 which will come and go.  It would last for 15 minutes at a time and may occur 2-3 times per day.  Her bowel movements are solid and normal.  Overall she is much improved.    Does complain of some eructations and reflux today, worse after stopping Pantoprazole 40 mg.  Patient tells me she was  only given a refill for a couple of weeks after leaving the hospital.    Patient tells that she has not had a refill of her Plavix since being in [place].  This was previously prescribed by her primary care provider in [place]. Has established with PCP here.    Denies fever, chills, weight loss, blood in her stool or symptoms that awaken her from sleep.  Past Medical History:  Diagnosis Date  . CAD (coronary artery disease) 2016  . Diabetes mellitus without complication ([place])   . Dialysis patient ([place])   . Diverticulitis   . GI bleeding   . Hypertension   . Lupus ([place])   . Renal disorder    dialysis    Past Surgical History:  Procedure Laterality Date  . CARDIAC SURGERY     cardiac shunt  . Kidney graft Left     Current Outpatient Medications  Medication Sig Dispense Refill  . amLODipine (NORVASC) 10 MG tablet Take 10 mg by mouth daily.    Marland Kitchen aspirin EC 81 MG tablet Take 1 tablet (81 mg total) by mouth daily. Hold for next 2 weeks and resume on 01/02/2018 (Patient taking differently: Take 81 mg by mouth daily. )    . atorvastatin (LIPITOR) 40 MG tablet Take 40 mg by mouth daily.     . clopidogrel (PLAVIX) 75 MG tablet Take 1 tablet (75 mg total) by mouth daily. Hold for next 2 weeks (Patient taking differently: Take 75 mg by mouth daily. )    .  ferrous sulfate 325 (65 FE) MG EC tablet Take 1 tablet (325 mg total) by mouth 2 (two) times daily. (Patient not taking: Reported on 02/12/2018) 60 tablet 3  . insulin lispro (HUMALOG) 100 UNIT/ML injection Inject 30-40 Units into the skin See admin instructions. Inject 40 units into the skin before breakfast and 30 units at bedtime    . lidocaine-prilocaine (EMLA) cream Apply 1 application topically Every Tuesday,Thursday,and Saturday with dialysis.     Marland Kitchen metoprolol tartrate (LOPRESSOR) 25 MG tablet Take 25 mg by mouth 2 (two) times daily.    . multivitamin (RENA-VIT) TABS tablet Take 1 tablet by mouth daily.    Marland Kitchen nystatin  (MYCOSTATIN/NYSTOP) powder Apply topically 4 (four) times daily. 15 g 3  . pantoprazole (PROTONIX) 40 MG tablet Take 1 tablet (40 mg total) by mouth daily. (Patient not taking: Reported on 02/12/2018) 30 tablet 0  . predniSONE (DELTASONE) 10 MG tablet Take 10 mg by mouth daily.    Marland Kitchen terconazole (TERAZOL 7) 0.4 % vaginal cream Place 1 applicator vaginally at bedtime. 45 g 3   No current facility-administered medications for this visit.     Allergies as of 03/06/2018 - Review Complete 03/04/2018  Allergen Reaction Noted  . Penicillins Hives 06/20/2017    Family History  Problem Relation Age of Onset  . Colon cancer Neg Hx   . Esophageal cancer Neg Hx   . Breast cancer Neg Hx     Social History   Socioeconomic History  . Marital status: Single    Spouse name: Not on file  . Number of children: 1  . Years of education: Not on file  . Highest education level: Not on file  Occupational History  . Occupation: disabled  Social Needs  . Financial resource strain: Not on file  . Food insecurity:    Worry: Not on file    Inability: Not on file  . Transportation needs:    Medical: Not on file    Non-medical: Not on file  Tobacco Use  . Smoking status: Never Smoker  . Smokeless tobacco: Never Used  Substance and Sexual Activity  . Alcohol use: Never    Frequency: Never  . Drug use: Never  . Sexual activity: Yes    Partners: Male    Birth control/protection: Condom  Lifestyle  . Physical activity:    Days per week: Not on file    Minutes per session: Not on file  . Stress: Not on file  Relationships  . Social connections:    Talks on phone: Not on file    Gets together: Not on file    Attends religious service: Not on file    Active member of club or organization: Not on file    Attends meetings of clubs or organizations: Not on file    Relationship status: Not on file  . Intimate partner violence:    Fear of current or ex partner: Not on file    Emotionally abused:  Not on file    Physically abused: Not on file    Forced sexual activity: Not on file  Other Topics Concern  . Not on file  Social History Narrative  . Not on file    Review of Systems:    Constitutional: No weight loss, fever or chills Cardiovascular: No chest pain Respiratory: No SOB  Gastrointestinal: See HPI and otherwise negative   Physical Exam:  Vital signs: BP 108/68   Pulse 98   Ht 5\' 1"  (1.549 m)  Wt 145 lb (65.8 kg)   BMI 27.40 kg/m   Constitutional:   Pleasant AA female appears to be in NAD, Well developed, Well nourished, alert and cooperative Respiratory: Respirations even and unlabored. Lungs clear to auscultation bilaterally.   No wheezes, crackles, or rhonchi.  Cardiovascular: Normal S1, S2. No MRG. Regular rate and rhythm. No peripheral edema, cyanosis or pallor.  Gastrointestinal:  Soft, nondistended, nontender. No rebound or guarding. Normal bowel sounds. No appreciable masses or hepatomegaly. Psychiatric: Demonstrates good judgement and reason without abnormal affect or behaviors.  No recent labs.  Assessment: 1.  History of current diverticulitis: Patient just finished 2 weeks of Moxifloxacin, continues with intermittent left lower quadrant pain, much improved 2.  Recent hematochezia: During hospital admission in November of last year, thought related to diverticulitis at that time 3.  Chronic anticoagulation status post stent in 2017: On Plavix 4.  ESRD on dialysis: Tuesday/ Thursday/ Saturday 5.  Eructations: With some reflux, worse off of Pantoprazole, patient ran out of prescription about a week ago  Plan: 1.  Currently patient is scheduled for colonoscopy 03/11/2018, we were unable to get clearance for her Plavix prior to today, due to this we will have to reschedule this colonoscopy. 2.  Patient does have history of recurrent diverticulitis, we will need to emergently schedule colonoscopy once we receive clearance for patient's Plavix.  Patient was  advised to hold her Plavix for 5 days prior to time procedure.  Initially we will try contacting her PCP in [place] who prescribed this for her prior to moving to [place].  Secondly we will try to contact patient's new PCP here in [place] to see if they will sign off on this. 3.  Once we receive above, patient will need to be rescheduled as soon as possible for a colonoscopy given her history of recurrent diverticulitis. 4.  Prescribed Hyoscyamine sulfate 0.125 mg sublingual tabs every 6 hours as needed for left lower quadrant cramping #30 with 1 refill 5.  Refilled patient's Pantoprazole 40 mg po daily, #30 refill x5 6.  Patient to follow wit Dr. Hilarie Fredrickson after time of procedure.  Ellouise Newer, PA-C [facility] 03/06/2018, 1:55 PM  Cc: Benito Mccreedy, MD   Addendum: Reviewed and agree with assessment and management plan. Pyrtle, Lajuan Lines, MD

## 2018-03-07 ENCOUNTER — Telehealth: Payer: Self-pay | Admitting: Internal Medicine

## 2018-03-07 NOTE — Telephone Encounter (Signed)
Jana Half from the pt primary care called in ref to the request to get Plavix clearance for the pt she advised was it a form that the doctor needs to sign and how long is the request for.

## 2018-03-07 NOTE — Telephone Encounter (Signed)
error 

## 2018-03-08 LAB — CYTOLOGY - PAP
DIAGNOSIS: NEGATIVE
HPV (WINDOPATH): DETECTED — AB
HPV 16/18/45 genotyping: NEGATIVE

## 2018-03-08 NOTE — Telephone Encounter (Signed)
Error

## 2018-03-11 ENCOUNTER — Encounter: Payer: Medicare (Managed Care) | Admitting: Internal Medicine

## 2018-03-11 ENCOUNTER — Telehealth: Payer: Self-pay | Admitting: Advanced Practice Midwife

## 2018-03-11 NOTE — Telephone Encounter (Signed)
Pt Pap results are normal cytology (no evidence of cancer or precancer) but HPV positive. She is negative for high risk HPV 16, 18 so per ASCCP guidelines follow up is Pap in 1 year.  Left message on pt phone to call office regarding lab results.

## 2018-03-19 ENCOUNTER — Other Ambulatory Visit: Payer: Self-pay | Admitting: Advanced Practice Midwife

## 2018-03-19 DIAGNOSIS — R928 Other abnormal and inconclusive findings on diagnostic imaging of breast: Secondary | ICD-10-CM

## 2018-04-03 ENCOUNTER — Other Ambulatory Visit: Payer: Self-pay

## 2018-04-10 ENCOUNTER — Other Ambulatory Visit: Payer: Medicare (Managed Care)

## 2018-04-15 ENCOUNTER — Ambulatory Visit (INDEPENDENT_AMBULATORY_CARE_PROVIDER_SITE_OTHER): Payer: Medicare (Managed Care) | Admitting: Cardiovascular Disease

## 2018-04-15 ENCOUNTER — Encounter (INDEPENDENT_AMBULATORY_CARE_PROVIDER_SITE_OTHER): Payer: Self-pay

## 2018-04-15 ENCOUNTER — Encounter: Payer: Self-pay | Admitting: Cardiovascular Disease

## 2018-04-15 VITALS — BP 140/76 | HR 94 | Ht 61.0 in | Wt 148.4 lb

## 2018-04-15 DIAGNOSIS — I25110 Atherosclerotic heart disease of native coronary artery with unstable angina pectoris: Secondary | ICD-10-CM

## 2018-04-15 DIAGNOSIS — Z992 Dependence on renal dialysis: Secondary | ICD-10-CM

## 2018-04-15 DIAGNOSIS — N186 End stage renal disease: Secondary | ICD-10-CM | POA: Diagnosis not present

## 2018-04-15 NOTE — Progress Notes (Signed)
Cardiology Office Note:    Date:  04/15/2018   ID:  Paula Bass, DOB 11-16-1970, MRN [id number]  PCP:  Benito Mccreedy, MD  Cardiologist:  Mertie Moores, MD  Electrophysiologist:  None   Referring MD: Benito Mccreedy, MD   Problem list 1.  Coronary artery disease-status post stenting follow-up in [place] in 2017 2.  End-stage renal disease 3.  Hypertension 4.  Hyperlipidemia  Chief Complaint  Patient presents with  . Chest Pain      Feb. 17, 2020    Paula Bass is a 48 y.o. female with a hx of end-stage renal disease, diabetes mellitus, hypertension, lupus who we are asked to see today by Dr. Jeanella Anton for further evaluation of chest discomfort.  She has had repeated episdoes of CP during dialysis .   Seems to be related to the volume and speed of the dialysis.   She has had them slow down dialysis and stop dialysis and the chest pains typically resolve at that point. Mid sterma,  Associated with dyspnea.  No radiation   Does not get any regular exercise  Watches her diet pretty well  Moved from [place] in   Has a hx of CAD with stenting  -she is had at least one stent placed.  She thinks he might of had balloon angioplasty of another vessel.  She thinks these CP are similar to when she was stented in the past.   Past Medical History:  Diagnosis Date  . CAD (coronary artery disease) 2016  . Diabetes mellitus without complication ([place])   . Dialysis patient ([place])   . Diverticulitis   . GI bleeding   . Hypertension   . Lupus ([place])   . Renal disorder    dialysis    Past Surgical History:  Procedure Laterality Date  . CARDIAC SURGERY     cardiac shunt  . Kidney graft Left     Current Medications: Current Meds  Medication Sig  . amLODipine (NORVASC) 10 MG tablet Take 10 mg by mouth daily.  Marland Kitchen aspirin EC 81 MG tablet Take 1 tablet (81 mg total) by mouth daily. Hold for next 2 weeks and resume on 01/02/2018  . atorvastatin (LIPITOR) 40 MG  tablet Take 40 mg by mouth daily.   . clopidogrel (PLAVIX) 75 MG tablet Take 1 tablet (75 mg total) by mouth daily. Hold for next 2 weeks  . hyoscyamine (LEVSIN SL) 0.125 MG SL tablet Place 1 tablet (0.125 mg total) under the tongue every 6 (six) hours as needed.  . insulin lispro (HUMALOG) 100 UNIT/ML injection Inject 30-40 Units into the skin See admin instructions. Inject 40 units into the skin before breakfast and 30 units at bedtime   . Insulin Syringe-Needle U-100 (INSULIN SYRINGE .5CC/31GX5/16") 31G X 5/16" 0.5 ML MISC   . lidocaine-prilocaine (EMLA) cream Apply 1 application topically Every Tuesday,Thursday,and Saturday with dialysis.   Marland Kitchen metoprolol tartrate (LOPRESSOR) 25 MG tablet Take 25 mg by mouth 2 (two) times daily.  . multivitamin (RENA-VIT) TABS tablet Take 1 tablet by mouth daily.  Marland Kitchen nystatin (MYCOSTATIN/NYSTOP) powder Apply topically 4 (four) times daily.  . pantoprazole (PROTONIX) 40 MG tablet Take 1 tablet (40 mg total) by mouth daily.  . predniSONE (DELTASONE) 10 MG tablet Take 1 tablet by mouth daily.     Allergies:   Penicillins   Social History   Socioeconomic History  . Marital status: Single    Spouse name: Not on file  . Number of children: 1  .  Years of education: Not on file  . Highest education level: Not on file  Occupational History  . Occupation: disabled  Social Needs  . Financial resource strain: Not on file  . Food insecurity:    Worry: Not on file    Inability: Not on file  . Transportation needs:    Medical: Not on file    Non-medical: Not on file  Tobacco Use  . Smoking status: Never Smoker  . Smokeless tobacco: Never Used  Substance and Sexual Activity  . Alcohol use: Never    Frequency: Never  . Drug use: Never  . Sexual activity: Yes    Partners: Male    Birth control/protection: Condom  Lifestyle  . Physical activity:    Days per week: Not on file    Minutes per session: Not on file  . Stress: Not on file  Relationships  .  Social connections:    Talks on phone: Not on file    Gets together: Not on file    Attends religious service: Not on file    Active member of club or organization: Not on file    Attends meetings of clubs or organizations: Not on file    Relationship status: Not on file  Other Topics Concern  . Not on file  Social History Narrative  . Not on file     Family History: The patient's family history is negative for Colon cancer, Esophageal cancer, and Breast cancer.  ROS:   Please see the history of present illness.     All other systems reviewed and are negative.  EKGs/Labs/Other Studies Reviewed:    The following studies were reviewed today:   EKG:    April 15, 2018: Normal sinus rhythm at 82.  No ST or T wave changes.  Recent Labs: 04/15/2018: ALT 9; BUN 43; Creatinine, Ser 8.99; Hemoglobin WILL FOLLOW; Platelets WILL FOLLOW; Potassium 4.7; Sodium 140  Recent Lipid Panel    Component Value Date/Time   CHOL 120 04/15/2018 0948   TRIG 92 04/15/2018 0948   HDL 59 04/15/2018 0948   CHOLHDL 2.0 04/15/2018 0948   LDLCALC 43 04/15/2018 0948    Physical Exam:    VS:  BP 140/76   Pulse 94   Ht 5\' 1"  (1.549 m)   Wt 148 lb 6.4 oz (67.3 kg)   BMI 28.04 kg/m     Wt Readings from Last 3 Encounters:  04/15/18 148 lb 6.4 oz (67.3 kg)  03/06/18 145 lb (65.8 kg)  03/04/18 150 lb (68 kg)     GEN:  Middle age female,   NAD  HEENT: Normal NECK: No JVD; No carotid bruits LYMPHATICS: No lymphadenopathy CARDIAC:   RR  RESPIRATORY:  Clear to auscultation without rales, wheezing or rhonchi  ABDOMEN: Soft, non-tender, non-distended MUSCULOSKELETAL:   She has a dialysis fistula in her left upper arm. SKIN: Warm and dry NEUROLOGIC:  Alert and oriented x 3 PSYCHIATRIC:  Normal affect   ASSESSMENT:    1. Coronary artery disease involving native coronary artery of native heart with unstable angina pectoris ([place])   2. ESRD on hemodialysis ([place])    PLAN:    In order of  problems listed above:  1. 1.  Chest discomfort: The patient presents with chest discomfort.  Symptoms are exactly the same as when she had a previous stent back in 2017.  She has episodes of chest discomfort during dialysis.  She does not get any regular exercise.  Given the same  symptoms as her previous episodes of angina, I think that she needs to have a heart catheterization.  We discussed the risks, benefits, options of heart catheterization.  She understands and agrees to proceed.   Medication Adjustments/Labs and Tests Ordered: Current medicines are reviewed at length with the patient today.  Concerns regarding medicines are outlined above.  Orders Placed This Encounter  Procedures  . CBC  . Basic Metabolic Panel (BMET)  . Hepatic function panel  . Lipid Profile  . EKG 12-Lead   No orders of the defined types were placed in this encounter.   Patient Instructions  Medication Instructions:  Your physician recommends that you continue on your current medications as directed. Please refer to the Current Medication list given to you today.  If you need a refill on your cardiac medications before your next appointment, please call your pharmacy.   Lab work: TODAY - cholesterol, liver panel, basic metabolic panel, CBC  If you have labs (blood work) drawn today and your tests are completely normal, you will receive your results only by: Marland Kitchen MyChart Message (if you have MyChart) OR . A paper copy in the mail If you have any lab test that is abnormal or we need to change your treatment, we will call you to review the results.    Testing/Procedures:  You are scheduled for a Cardiac Catheterization on Friday, February 21 with Dr. Harrell Gave End.  1. Please arrive at the [facility] (Main Entrance A) at [facility]: [place], [place] [address] at 7:00 AM (This time is two hours before your procedure to ensure your preparation). Free valet parking service is  available.   Special note: Every effort is made to have your procedure done on time. Please understand that emergencies sometimes delay scheduled procedures.  2. Diet: Do not eat solid foods after midnight.  The patient may have clear liquids until 5am upon the day of the procedure.  3. Labs: You will need to have blood drawn on Monday, February 17 at [facility] at [facility]. [address]  Open: 7:30am - 5pm    Phone: [phone]. You do not need to be fasting.  4. Medication instructions in preparation for your procedure:   Contrast Allergy: No   Take only 15 units of insulin the night before your procedure. Do not take any insulin on the day of the procedure.   On the morning of your procedure, take your Plavix/Clopidogrel and Aspirin any morning medicines NOT listed above.  You may use sips of water.  5. Plan for one night stay--bring personal belongings. 6. Bring a current list of your medications and current insurance cards. 7. You MUST have a responsible person to drive you home. 8. Someone MUST be with you the first 24 hours after you arrive home or your discharge will be delayed. 9. Please wear clothes that are easy to get on and off and wear slip-on shoes.  Thank you for allowing [place] to care for you!   -- [place] [facility]   Follow-Up: At [facility], you and your health needs are our priority.  As part of our continuing mission to provide you with exceptional heart care, we have created designated Provider Care Teams.  These Care Teams include your primary Cardiologist (physician) and Advanced Practice Providers (APPs -  Physician Assistants and Nurse Practitioners) who all work together to provide you with the care you need, when you need  it. You will need a follow up appointment in:  3-4 weeks.  You may see Dr. Acie Fredrickson or one of the following Advanced Practice Providers on your designated Care Team: Richardson Dopp,  PA-C [place], [place] . Daune Perch, NP      Signed, Mertie Moores, MD  04/15/2018 5:37 PM    [place]

## 2018-04-15 NOTE — Patient Instructions (Signed)
Medication Instructions:  Your physician recommends that you continue on your current medications as directed. Please refer to the Current Medication list given to you today.  If you need a refill on your cardiac medications before your next appointment, please call your pharmacy.   Lab work: TODAY - cholesterol, liver panel, basic metabolic panel, CBC  If you have labs (blood work) drawn today and your tests are completely normal, you will receive your results only by: Marland Kitchen MyChart Message (if you have MyChart) OR . A paper copy in the mail If you have any lab test that is abnormal or we need to change your treatment, we will call you to review the results.    Testing/Procedures:  You are scheduled for a Cardiac Catheterization on Friday, February 21 with Dr. Harrell Gave End.  1. Please arrive at the [facility] (Main Entrance A) at [facility]: [place], [place] [address] at 7:00 AM (This time is two hours before your procedure to ensure your preparation). Free valet parking service is available.   Special note: Every effort is made to have your procedure done on time. Please understand that emergencies sometimes delay scheduled procedures.  2. Diet: Do not eat solid foods after midnight.  The patient may have clear liquids until 5am upon the day of the procedure.  3. Labs: You will need to have blood drawn on Monday, February 17 at [facility] at [facility]. [address]  Open: 7:30am - 5pm    Phone: [phone]. You do not need to be fasting.  4. Medication instructions in preparation for your procedure:   Contrast Allergy: No   Take only 15 units of insulin the night before your procedure. Do not take any insulin on the day of the procedure.   On the morning of your procedure, take your Plavix/Clopidogrel and Aspirin any morning medicines NOT listed above.  You may use sips of water.  5. Plan for one night stay--bring  personal belongings. 6. Bring a current list of your medications and current insurance cards. 7. You MUST have a responsible person to drive you home. 8. Someone MUST be with you the first 24 hours after you arrive home or your discharge will be delayed. 9. Please wear clothes that are easy to get on and off and wear slip-on shoes.  Thank you for allowing [place] to care for you!   -- [place] [facility]   Follow-Up: At [facility], you and your health needs are our priority.  As part of our continuing mission to provide you with exceptional heart care, we have created designated Provider Care Teams.  These Care Teams include your primary Cardiologist (physician) and Advanced Practice Providers (APPs -  Physician Assistants and Nurse Practitioners) who all work together to provide you with the care you need, when you need it. You will need a follow up appointment in:  3-4 weeks.  You may see Dr. Acie Fredrickson or one of the following Advanced Practice Providers on your designated Care Team: Richardson Dopp, PA-C [place], [place] . Daune Perch, NP

## 2018-04-15 NOTE — H&P (View-Only) (Signed)
Cardiology Office Note:    Date:  04/15/2018   ID:  Adaly Puder, DOB 1970-03-29, MRN [id number]  PCP:  Benito Mccreedy, MD  Cardiologist:  Mertie Moores, MD  Electrophysiologist:  None   Referring MD: Benito Mccreedy, MD   Problem list 1.  Coronary artery disease-status post stenting follow-up in [place] in 2017 2.  End-stage renal disease 3.  Hypertension 4.  Hyperlipidemia  Chief Complaint  Patient presents with  . Chest Pain      Feb. 17, 2020    Paula Bass is a 48 y.o. female with a hx of end-stage renal disease, diabetes mellitus, hypertension, lupus who we are asked to see today by Dr. Jeanella Anton for further evaluation of chest discomfort.  She has had repeated episdoes of CP during dialysis .   Seems to be related to the volume and speed of the dialysis.   She has had them slow down dialysis and stop dialysis and the chest pains typically resolve at that point. Mid sterma,  Associated with dyspnea.  No radiation   Does not get any regular exercise  Watches her diet pretty well  Moved from [place] in   Has a hx of CAD with stenting  -she is had at least one stent placed.  She thinks he might of had balloon angioplasty of another vessel.  She thinks these CP are similar to when she was stented in the past.   Past Medical History:  Diagnosis Date  . CAD (coronary artery disease) 2016  . Diabetes mellitus without complication ([place])   . Dialysis patient ([place])   . Diverticulitis   . GI bleeding   . Hypertension   . Lupus ([place])   . Renal disorder    dialysis    Past Surgical History:  Procedure Laterality Date  . CARDIAC SURGERY     cardiac shunt  . Kidney graft Left     Current Medications: Current Meds  Medication Sig  . amLODipine (NORVASC) 10 MG tablet Take 10 mg by mouth daily.  Marland Kitchen aspirin EC 81 MG tablet Take 1 tablet (81 mg total) by mouth daily. Hold for next 2 weeks and resume on 01/02/2018  . atorvastatin (LIPITOR) 40 MG  tablet Take 40 mg by mouth daily.   . clopidogrel (PLAVIX) 75 MG tablet Take 1 tablet (75 mg total) by mouth daily. Hold for next 2 weeks  . hyoscyamine (LEVSIN SL) 0.125 MG SL tablet Place 1 tablet (0.125 mg total) under the tongue every 6 (six) hours as needed.  . insulin lispro (HUMALOG) 100 UNIT/ML injection Inject 30-40 Units into the skin See admin instructions. Inject 40 units into the skin before breakfast and 30 units at bedtime   . Insulin Syringe-Needle U-100 (INSULIN SYRINGE .5CC/31GX5/16") 31G X 5/16" 0.5 ML MISC   . lidocaine-prilocaine (EMLA) cream Apply 1 application topically Every Tuesday,Thursday,and Saturday with dialysis.   Marland Kitchen metoprolol tartrate (LOPRESSOR) 25 MG tablet Take 25 mg by mouth 2 (two) times daily.  . multivitamin (RENA-VIT) TABS tablet Take 1 tablet by mouth daily.  Marland Kitchen nystatin (MYCOSTATIN/NYSTOP) powder Apply topically 4 (four) times daily.  . pantoprazole (PROTONIX) 40 MG tablet Take 1 tablet (40 mg total) by mouth daily.  . predniSONE (DELTASONE) 10 MG tablet Take 1 tablet by mouth daily.     Allergies:   Penicillins   Social History   Socioeconomic History  . Marital status: Single    Spouse name: Not on file  . Number of children: 1  .  Years of education: Not on file  . Highest education level: Not on file  Occupational History  . Occupation: disabled  Social Needs  . Financial resource strain: Not on file  . Food insecurity:    Worry: Not on file    Inability: Not on file  . Transportation needs:    Medical: Not on file    Non-medical: Not on file  Tobacco Use  . Smoking status: Never Smoker  . Smokeless tobacco: Never Used  Substance and Sexual Activity  . Alcohol use: Never    Frequency: Never  . Drug use: Never  . Sexual activity: Yes    Partners: Male    Birth control/protection: Condom  Lifestyle  . Physical activity:    Days per week: Not on file    Minutes per session: Not on file  . Stress: Not on file  Relationships  .  Social connections:    Talks on phone: Not on file    Gets together: Not on file    Attends religious service: Not on file    Active member of club or organization: Not on file    Attends meetings of clubs or organizations: Not on file    Relationship status: Not on file  Other Topics Concern  . Not on file  Social History Narrative  . Not on file     Family History: The patient's family history is negative for Colon cancer, Esophageal cancer, and Breast cancer.  ROS:   Please see the history of present illness.     All other systems reviewed and are negative.  EKGs/Labs/Other Studies Reviewed:    The following studies were reviewed today:   EKG:    April 15, 2018: Normal sinus rhythm at 82.  No ST or T wave changes.  Recent Labs: 04/15/2018: ALT 9; BUN 43; Creatinine, Ser 8.99; Hemoglobin WILL FOLLOW; Platelets WILL FOLLOW; Potassium 4.7; Sodium 140  Recent Lipid Panel    Component Value Date/Time   CHOL 120 04/15/2018 0948   TRIG 92 04/15/2018 0948   HDL 59 04/15/2018 0948   CHOLHDL 2.0 04/15/2018 0948   LDLCALC 43 04/15/2018 0948    Physical Exam:    VS:  BP 140/76   Pulse 94   Ht 5\' 1"  (1.549 m)   Wt 148 lb 6.4 oz (67.3 kg)   BMI 28.04 kg/m     Wt Readings from Last 3 Encounters:  04/15/18 148 lb 6.4 oz (67.3 kg)  03/06/18 145 lb (65.8 kg)  03/04/18 150 lb (68 kg)     GEN:  Middle age female,   NAD  HEENT: Normal NECK: No JVD; No carotid bruits LYMPHATICS: No lymphadenopathy CARDIAC:   RR  RESPIRATORY:  Clear to auscultation without rales, wheezing or rhonchi  ABDOMEN: Soft, non-tender, non-distended MUSCULOSKELETAL:   She has a dialysis fistula in her left upper arm. SKIN: Warm and dry NEUROLOGIC:  Alert and oriented x 3 PSYCHIATRIC:  Normal affect   ASSESSMENT:    1. Coronary artery disease involving native coronary artery of native heart with unstable angina pectoris ([place])   2. ESRD on hemodialysis ([place])    PLAN:    In order of  problems listed above:  1. 1.  Chest discomfort: The patient presents with chest discomfort.  Symptoms are exactly the same as when she had a previous stent back in 2017.  She has episodes of chest discomfort during dialysis.  She does not get any regular exercise.  Given the same  symptoms as her previous episodes of angina, I think that she needs to have a heart catheterization.  We discussed the risks, benefits, options of heart catheterization.  She understands and agrees to proceed.   Medication Adjustments/Labs and Tests Ordered: Current medicines are reviewed at length with the patient today.  Concerns regarding medicines are outlined above.  Orders Placed This Encounter  Procedures  . CBC  . Basic Metabolic Panel (BMET)  . Hepatic function panel  . Lipid Profile  . EKG 12-Lead   No orders of the defined types were placed in this encounter.   Patient Instructions  Medication Instructions:  Your physician recommends that you continue on your current medications as directed. Please refer to the Current Medication list given to you today.  If you need a refill on your cardiac medications before your next appointment, please call your pharmacy.   Lab work: TODAY - cholesterol, liver panel, basic metabolic panel, CBC  If you have labs (blood work) drawn today and your tests are completely normal, you will receive your results only by: Marland Kitchen MyChart Message (if you have MyChart) OR . A paper copy in the mail If you have any lab test that is abnormal or we need to change your treatment, we will call you to review the results.    Testing/Procedures:  You are scheduled for a Cardiac Catheterization on Friday, February 21 with Dr. Harrell Gave End.  1. Please arrive at the [facility] (Main Entrance A) at [facility]: [place], [place] [address] at 7:00 AM (This time is two hours before your procedure to ensure your preparation). Free valet parking service is  available.   Special note: Every effort is made to have your procedure done on time. Please understand that emergencies sometimes delay scheduled procedures.  2. Diet: Do not eat solid foods after midnight.  The patient may have clear liquids until 5am upon the day of the procedure.  3. Labs: You will need to have blood drawn on Monday, February 17 at [facility] at [facility]. [address]  Open: 7:30am - 5pm    Phone: [phone]. You do not need to be fasting.  4. Medication instructions in preparation for your procedure:   Contrast Allergy: No   Take only 15 units of insulin the night before your procedure. Do not take any insulin on the day of the procedure.   On the morning of your procedure, take your Plavix/Clopidogrel and Aspirin any morning medicines NOT listed above.  You may use sips of water.  5. Plan for one night stay--bring personal belongings. 6. Bring a current list of your medications and current insurance cards. 7. You MUST have a responsible person to drive you home. 8. Someone MUST be with you the first 24 hours after you arrive home or your discharge will be delayed. 9. Please wear clothes that are easy to get on and off and wear slip-on shoes.  Thank you for allowing [place] to care for you!   -- [place] [facility]   Follow-Up: At [facility], you and your health needs are our priority.  As part of our continuing mission to provide you with exceptional heart care, we have created designated Provider Care Teams.  These Care Teams include your primary Cardiologist (physician) and Advanced Practice Providers (APPs -  Physician Assistants and Nurse Practitioners) who all work together to provide you with the care you need, when you need  it. You will need a follow up appointment in:  3-4 weeks.  You may see Dr. Acie Fredrickson or one of the following Advanced Practice Providers on your designated Care Team: Richardson Dopp,  PA-C [place], [place] . Daune Perch, NP      Signed, Mertie Moores, MD  04/15/2018 5:37 PM    [place]

## 2018-04-16 LAB — BASIC METABOLIC PANEL
BUN/Creatinine Ratio: 5 — ABNORMAL LOW (ref 9–23)
BUN: 43 mg/dL — ABNORMAL HIGH (ref 6–24)
CO2: 23 mmol/L (ref 20–29)
Calcium: 9.6 mg/dL (ref 8.7–10.2)
Chloride: 99 mmol/L (ref 96–106)
Creatinine, Ser: 8.99 mg/dL — ABNORMAL HIGH (ref 0.57–1.00)
GFR calc Af Amer: 5 mL/min/{1.73_m2} — ABNORMAL LOW (ref >59)
GFR, EST NON AFRICAN AMERICAN: 5 mL/min/{1.73_m2} — AB (ref >59)
Glucose: 122 mg/dL — ABNORMAL HIGH (ref 65–99)
Potassium: 4.7 mmol/L (ref 3.5–5.2)
Sodium: 140 mmol/L (ref 134–144)

## 2018-04-16 LAB — LIPID PANEL
CHOLESTEROL TOTAL: 120 mg/dL (ref 100–199)
Chol/HDL Ratio: 2 ratio (ref 0.0–4.4)
HDL: 59 mg/dL (ref >39)
LDL Calculated: 43 mg/dL (ref 0–99)
Triglycerides: 92 mg/dL (ref 0–149)
VLDL Cholesterol Cal: 18 mg/dL (ref 5–40)

## 2018-04-16 LAB — HEPATIC FUNCTION PANEL
ALT: 9 IU/L (ref 0–32)
AST: 14 IU/L (ref 0–40)
Albumin: 3.7 g/dL — ABNORMAL LOW (ref 3.8–4.8)
Alkaline Phosphatase: 118 IU/L — ABNORMAL HIGH (ref 39–117)
Bilirubin Total: 0.3 mg/dL (ref 0.0–1.2)
Bilirubin, Direct: 0.12 mg/dL (ref 0.00–0.40)
Total Protein: 6.7 g/dL (ref 6.0–8.5)

## 2018-04-16 LAB — CBC
HEMATOCRIT: 35.5 % (ref 34.0–46.6)
Hemoglobin: 11.5 g/dL (ref 11.1–15.9)
MCH: 29.8 pg (ref 26.6–33.0)
MCHC: 32.4 g/dL (ref 31.5–35.7)
MCV: 92 fL (ref 79–97)
Platelets: 285 10*3/uL (ref 150–450)
RBC: 3.86 x10E6/uL (ref 3.77–5.28)
RDW: 16.8 % — ABNORMAL HIGH (ref 11.7–15.4)
WBC: 11.6 10*3/uL — ABNORMAL HIGH (ref 3.4–10.8)

## 2018-04-17 ENCOUNTER — Encounter: Payer: Self-pay | Admitting: Physician Assistant

## 2018-04-18 ENCOUNTER — Telehealth: Payer: Self-pay | Admitting: *Deleted

## 2018-04-18 NOTE — Telephone Encounter (Signed)
Pt contacted pre-catheterization scheduled at [facility] for: Friday February 21,2020 9 AM Verified arrival time and place: [place] Entrance A at: 7AM  No solid food after midnight prior to cath, clear liquids until 5 AM day of procedure. Contrast allergy: no  Hold: Insulin-AM of procedure  1/2 Insulin PM prior to procedure.  Except hold medications AM meds can be  taken pre-cath with sip of water including: ASA 81 mg  Confirmed patient has responsible person to drive home post procedure and observe 24 hours after arriving home: yes

## 2018-04-19 ENCOUNTER — Ambulatory Visit (HOSPITAL_COMMUNITY)
Admission: RE | Admit: 2018-04-19 | Discharge: 2018-04-22 | Disposition: A | Discharge status: Home / Self Care | Payer: Medicare (Managed Care) | Attending: Internal Medicine | Admitting: Internal Medicine

## 2018-04-19 ENCOUNTER — Encounter (HOSPITAL_COMMUNITY)
Admission: RE | Disposition: A | Discharge status: Home / Self Care | Payer: Self-pay | Source: Home / Self Care | Attending: Internal Medicine

## 2018-04-19 ENCOUNTER — Other Ambulatory Visit: Payer: Self-pay

## 2018-04-19 DIAGNOSIS — Z992 Dependence on renal dialysis: Secondary | ICD-10-CM | POA: Insufficient documentation

## 2018-04-19 DIAGNOSIS — I2584 Coronary atherosclerosis due to calcified coronary lesion: Secondary | ICD-10-CM | POA: Diagnosis not present

## 2018-04-19 DIAGNOSIS — Z7982 Long term (current) use of aspirin: Secondary | ICD-10-CM | POA: Diagnosis not present

## 2018-04-19 DIAGNOSIS — Z955 Presence of coronary angioplasty implant and graft: Secondary | ICD-10-CM | POA: Diagnosis not present

## 2018-04-19 DIAGNOSIS — K5730 Diverticulosis of large intestine without perforation or abscess without bleeding: Secondary | ICD-10-CM | POA: Insufficient documentation

## 2018-04-19 DIAGNOSIS — E1122 Type 2 diabetes mellitus with diabetic chronic kidney disease: Secondary | ICD-10-CM | POA: Diagnosis not present

## 2018-04-19 DIAGNOSIS — I25110 Atherosclerotic heart disease of native coronary artery with unstable angina pectoris: Secondary | ICD-10-CM | POA: Diagnosis not present

## 2018-04-19 DIAGNOSIS — Z7902 Long term (current) use of antithrombotics/antiplatelets: Secondary | ICD-10-CM | POA: Diagnosis not present

## 2018-04-19 DIAGNOSIS — E278 Other specified disorders of adrenal gland: Secondary | ICD-10-CM | POA: Insufficient documentation

## 2018-04-19 DIAGNOSIS — M329 Systemic lupus erythematosus, unspecified: Secondary | ICD-10-CM | POA: Diagnosis not present

## 2018-04-19 DIAGNOSIS — I2510 Atherosclerotic heart disease of native coronary artery without angina pectoris: Secondary | ICD-10-CM | POA: Diagnosis present

## 2018-04-19 DIAGNOSIS — I200 Unstable angina: Secondary | ICD-10-CM

## 2018-04-19 DIAGNOSIS — Z880 Allergy status to penicillin: Secondary | ICD-10-CM | POA: Insufficient documentation

## 2018-04-19 DIAGNOSIS — N186 End stage renal disease: Secondary | ICD-10-CM | POA: Insufficient documentation

## 2018-04-19 DIAGNOSIS — I700 Atherosclerosis of aorta: Secondary | ICD-10-CM | POA: Insufficient documentation

## 2018-04-19 DIAGNOSIS — Z79899 Other long term (current) drug therapy: Secondary | ICD-10-CM | POA: Insufficient documentation

## 2018-04-19 DIAGNOSIS — B372 Candidiasis of skin and nail: Secondary | ICD-10-CM

## 2018-04-19 DIAGNOSIS — Z794 Long term (current) use of insulin: Secondary | ICD-10-CM | POA: Diagnosis not present

## 2018-04-19 DIAGNOSIS — E785 Hyperlipidemia, unspecified: Secondary | ICD-10-CM | POA: Insufficient documentation

## 2018-04-19 DIAGNOSIS — I120 Hypertensive chronic kidney disease with stage 5 chronic kidney disease or end stage renal disease: Secondary | ICD-10-CM | POA: Diagnosis not present

## 2018-04-19 HISTORY — PX: CORONARY STENT INTERVENTION: CATH118234

## 2018-04-19 HISTORY — PX: LEFT HEART CATH AND CORONARY ANGIOGRAPHY: CATH118249

## 2018-04-19 LAB — CBC WITH DIFFERENTIAL/PLATELET
Abs Immature Granulocytes: 0.12 10*3/uL — ABNORMAL HIGH (ref 0.00–0.07)
BASOS ABS: 0 10*3/uL (ref 0.0–0.1)
Basophils Relative: 0 %
Eosinophils Absolute: 0 10*3/uL (ref 0.0–0.5)
Eosinophils Relative: 0 %
HCT: 28 % — ABNORMAL LOW (ref 36.0–46.0)
Hemoglobin: 8.8 g/dL — ABNORMAL LOW (ref 12.0–15.0)
Immature Granulocytes: 1 %
Lymphocytes Relative: 7 %
Lymphs Abs: 1.3 10*3/uL (ref 0.7–4.0)
MCH: 30.1 pg (ref 26.0–34.0)
MCHC: 31.4 g/dL (ref 30.0–36.0)
MCV: 95.9 fL (ref 80.0–100.0)
Monocytes Absolute: 1.1 10*3/uL — ABNORMAL HIGH (ref 0.1–1.0)
Monocytes Relative: 6 %
NEUTROS ABS: 14.7 10*3/uL — AB (ref 1.7–7.7)
Neutrophils Relative %: 86 %
Platelets: 176 10*3/uL (ref 150–400)
RBC: 2.92 MIL/uL — ABNORMAL LOW (ref 3.87–5.11)
RDW: 19 % — ABNORMAL HIGH (ref 11.5–15.5)
WBC: 17.1 10*3/uL — ABNORMAL HIGH (ref 4.0–10.5)
nRBC: 0.2 % (ref 0.0–0.2)

## 2018-04-19 LAB — CBC
HEMATOCRIT: 27.7 % — AB (ref 36.0–46.0)
Hemoglobin: 8.6 g/dL — ABNORMAL LOW (ref 12.0–15.0)
MCH: 29.6 pg (ref 26.0–34.0)
MCHC: 31 g/dL (ref 30.0–36.0)
MCV: 95.2 fL (ref 80.0–100.0)
Platelets: <5 10*3/uL — CL (ref 150–400)
RBC: 2.91 MIL/uL — ABNORMAL LOW (ref 3.87–5.11)
RDW: 18.6 % — ABNORMAL HIGH (ref 11.5–15.5)
WBC: 3.5 10*3/uL — ABNORMAL LOW (ref 4.0–10.5)
nRBC: 0.6 % — ABNORMAL HIGH (ref 0.0–0.2)

## 2018-04-19 LAB — GLUCOSE, CAPILLARY
GLUCOSE-CAPILLARY: 205 mg/dL — AB (ref 70–99)
Glucose-Capillary: 66 mg/dL — ABNORMAL LOW (ref 70–99)
Glucose-Capillary: 116 mg/dL — ABNORMAL HIGH (ref 70–99)
Glucose-Capillary: 76 mg/dL (ref 70–99)
Glucose-Capillary: 72 mg/dL (ref 70–99)
Glucose-Capillary: 60 mg/dL — ABNORMAL LOW (ref 70–99)
Glucose-Capillary: 157 mg/dL — ABNORMAL HIGH (ref 70–99)
Glucose-Capillary: 165 mg/dL — ABNORMAL HIGH (ref 70–99)

## 2018-04-19 LAB — IMMATURE PLATELET FRACTION: Immature Platelet Fraction: 3.4 % (ref 1.2–8.6)

## 2018-04-19 LAB — PREGNANCY, URINE: PREG TEST UR: NEGATIVE

## 2018-04-19 LAB — POCT ACTIVATED CLOTTING TIME
Activated Clotting Time: 191 seconds
Activated Clotting Time: 180 seconds

## 2018-04-19 SURGERY — LEFT HEART CATH AND CORONARY ANGIOGRAPHY
Anesthesia: LOCAL

## 2018-04-19 MED ORDER — DEXTROSE 50 % IV SOLN
12.5000 g | INTRAVENOUS | Status: AC
  Administered 2018-04-19: 12.5 g via INTRAVENOUS

## 2018-04-19 MED ORDER — NITROGLYCERIN 1 MG/10 ML FOR IR/CATH LAB
INTRA_ARTERIAL | Status: DC | PRN
  Administered 2018-04-19 (×2): 200 ug via INTRACORONARY

## 2018-04-19 MED ORDER — ACETAMINOPHEN 325 MG PO TABS
650.0000 mg | ORAL_TABLET | ORAL | Status: DC | PRN
  Administered 2018-04-21 (×2): 650 mg via ORAL
  Filled 2018-04-19 (×2): qty 2

## 2018-04-19 MED ORDER — PROMETHAZINE HCL 25 MG PO TABS: 25.0000 mg | ORAL_TABLET | Freq: Four times a day (QID) | ORAL | Status: DC | PRN

## 2018-04-19 MED ORDER — THE SENSUOUS HEART BOOK
Freq: Once | Status: AC
  Administered 2018-04-20: 03:00:00
  Filled 2018-04-19: qty 1

## 2018-04-19 MED ORDER — ANGIOPLASTY BOOK
Freq: Once | Status: AC
  Administered 2018-04-20: 03:00:00
  Filled 2018-04-19: qty 1

## 2018-04-19 MED ORDER — PROMETHAZINE HCL 25 MG/ML IJ SOLN
12.5000 mg | Freq: Four times a day (QID) | INTRAMUSCULAR | Status: DC | PRN
  Filled 2018-04-19: qty 1

## 2018-04-19 MED ORDER — PREDNISONE 10 MG PO TABS
10.0000 mg | ORAL_TABLET | Freq: Every day | ORAL | Status: DC
  Administered 2018-04-19 – 2018-04-22 (×4): 10 mg via ORAL
  Filled 2018-04-19 (×4): qty 1

## 2018-04-19 MED ORDER — FENTANYL CITRATE (PF) 100 MCG/2ML IJ SOLN
INTRAMUSCULAR | Status: DC | PRN
  Administered 2018-04-19 (×2): 12.5 ug via INTRAVENOUS

## 2018-04-19 MED ORDER — ASPIRIN 81 MG PO CHEW: 81.0000 mg | CHEWABLE_TABLET | ORAL | Status: DC

## 2018-04-19 MED ORDER — NITROGLYCERIN 1 MG/10 ML FOR IR/CATH LAB
INTRA_ARTERIAL | Status: AC
  Filled 2018-04-19: qty 10

## 2018-04-19 MED ORDER — DEXTROSE 50 % IV SOLN
INTRAVENOUS | Status: AC
  Administered 2018-04-19: 12:00:00 50 mL
  Filled 2018-04-19: qty 50

## 2018-04-19 MED ORDER — LIDOCAINE HCL (PF) 1 % IJ SOLN
INTRAMUSCULAR | Status: AC
  Filled 2018-04-19: qty 30

## 2018-04-19 MED ORDER — SODIUM CHLORIDE 0.9% FLUSH: 3.0000 mL | INTRAVENOUS | Status: DC | PRN

## 2018-04-19 MED ORDER — PANTOPRAZOLE SODIUM 40 MG PO TBEC
40.0000 mg | DELAYED_RELEASE_TABLET | Freq: Every day | ORAL | Status: DC
  Administered 2018-04-19 – 2018-04-22 (×4): 40 mg via ORAL
  Filled 2018-04-19 (×4): qty 1

## 2018-04-19 MED ORDER — CLOPIDOGREL BISULFATE 300 MG PO TABS
ORAL_TABLET | ORAL | Status: AC
  Filled 2018-04-19: qty 1

## 2018-04-19 MED ORDER — CLOPIDOGREL BISULFATE 75 MG PO TABS
75.0000 mg | ORAL_TABLET | Freq: Every day | ORAL | Status: DC
  Administered 2018-04-20 – 2018-04-22 (×3): 75 mg via ORAL
  Filled 2018-04-19 (×3): qty 1

## 2018-04-19 MED ORDER — AMLODIPINE BESYLATE 5 MG PO TABS: 10.0000 mg | ORAL_TABLET | Freq: Every day | ORAL | Status: DC

## 2018-04-19 MED ORDER — MIDAZOLAM HCL 2 MG/2ML IJ SOLN
INTRAMUSCULAR | Status: DC | PRN
  Administered 2018-04-19 (×2): 0.5 mg via INTRAVENOUS
  Administered 2018-04-19: 1 mg via INTRAVENOUS

## 2018-04-19 MED ORDER — MIDAZOLAM HCL 2 MG/2ML IJ SOLN
INTRAMUSCULAR | Status: AC
  Filled 2018-04-19: qty 2

## 2018-04-19 MED ORDER — HYDRALAZINE HCL 20 MG/ML IJ SOLN: 5.0000 mg | INTRAMUSCULAR | Status: AC | PRN

## 2018-04-19 MED ORDER — LIDOCAINE HCL (PF) 1 % IJ SOLN
INTRAMUSCULAR | Status: DC | PRN
  Administered 2018-04-19: 15 mL

## 2018-04-19 MED ORDER — HEPARIN SODIUM (PORCINE) 1000 UNIT/ML IJ SOLN
INTRAMUSCULAR | Status: AC
  Filled 2018-04-19: qty 1

## 2018-04-19 MED ORDER — ATORVASTATIN CALCIUM 40 MG PO TABS
40.0000 mg | ORAL_TABLET | Freq: Every day | ORAL | Status: DC
  Administered 2018-04-19 – 2018-04-22 (×4): 40 mg via ORAL
  Filled 2018-04-19 (×4): qty 1

## 2018-04-19 MED ORDER — MORPHINE SULFATE (PF) 2 MG/ML IV SOLN
2.0000 mg | INTRAVENOUS | Status: DC | PRN
  Administered 2018-04-19: 16:00:00 2 mg via INTRAVENOUS
  Filled 2018-04-19: qty 1

## 2018-04-19 MED ORDER — ONDANSETRON HCL 4 MG/2ML IJ SOLN
4.0000 mg | Freq: Once | INTRAMUSCULAR | Status: AC
  Administered 2018-04-19: 4 mg via INTRAVENOUS
  Filled 2018-04-19: qty 2

## 2018-04-19 MED ORDER — HEPARIN (PORCINE) IN NACL 1000-0.9 UT/500ML-% IV SOLN
INTRAVENOUS | Status: AC
  Filled 2018-04-19: qty 1000

## 2018-04-19 MED ORDER — HEPARIN (PORCINE) IN NACL 1000-0.9 UT/500ML-% IV SOLN
INTRAVENOUS | Status: DC | PRN
  Administered 2018-04-19 (×2): 500 mL

## 2018-04-19 MED ORDER — SODIUM CHLORIDE 0.9 % IV SOLN: 250.0000 mL | INTRAVENOUS | Status: DC | PRN

## 2018-04-19 MED ORDER — HYOSCYAMINE SULFATE 0.125 MG SL SUBL
0.1250 mg | SUBLINGUAL_TABLET | Freq: Four times a day (QID) | SUBLINGUAL | Status: DC | PRN
  Filled 2018-04-19: qty 1

## 2018-04-19 MED ORDER — SODIUM CHLORIDE 0.9% FLUSH
3.0000 mL | Freq: Two times a day (BID) | INTRAVENOUS | Status: DC
  Administered 2018-04-19 – 2018-04-21 (×5): 3 mL via INTRAVENOUS

## 2018-04-19 MED ORDER — LABETALOL HCL 5 MG/ML IV SOLN: 10.0000 mg | INTRAVENOUS | Status: AC | PRN

## 2018-04-19 MED ORDER — FENTANYL CITRATE (PF) 100 MCG/2ML IJ SOLN
INTRAMUSCULAR | Status: AC
  Filled 2018-04-19: qty 2

## 2018-04-19 MED ORDER — CLOPIDOGREL BISULFATE 300 MG PO TABS
ORAL_TABLET | ORAL | Status: DC | PRN
  Administered 2018-04-19: 300 mg via ORAL

## 2018-04-19 MED ORDER — ONDANSETRON HCL 4 MG/2ML IJ SOLN
4.0000 mg | Freq: Four times a day (QID) | INTRAMUSCULAR | Status: DC | PRN
  Administered 2018-04-19 – 2018-04-20 (×2): 4 mg via INTRAVENOUS
  Filled 2018-04-19: qty 2

## 2018-04-19 MED ORDER — ASPIRIN EC 81 MG PO TBEC
81.0000 mg | DELAYED_RELEASE_TABLET | Freq: Every day | ORAL | Status: DC
  Administered 2018-04-20 – 2018-04-22 (×3): 81 mg via ORAL
  Filled 2018-04-19 (×3): qty 1

## 2018-04-19 MED ORDER — RENA-VITE PO TABS
1.0000 | ORAL_TABLET | Freq: Every day | ORAL | Status: DC
  Administered 2018-04-21: 1 via ORAL
  Filled 2018-04-19 (×2): qty 1

## 2018-04-19 MED ORDER — METOPROLOL TARTRATE 12.5 MG HALF TABLET: 25.0000 mg | ORAL_TABLET | Freq: Two times a day (BID) | ORAL | Status: DC

## 2018-04-19 MED ORDER — HEPARIN SODIUM (PORCINE) 1000 UNIT/ML IJ SOLN
INTRAMUSCULAR | Status: DC | PRN
  Administered 2018-04-19: 2000 [IU] via INTRAVENOUS
  Administered 2018-04-19: 6000 [IU] via INTRAVENOUS

## 2018-04-19 MED ORDER — PROMETHAZINE HCL 25 MG RE SUPP
25.0000 mg | Freq: Four times a day (QID) | RECTAL | Status: DC | PRN
  Filled 2018-04-19: qty 1

## 2018-04-19 MED ORDER — SODIUM CHLORIDE 0.9% IV SOLUTION: Freq: Once | INTRAVENOUS | Status: DC

## 2018-04-19 MED ORDER — CLOPIDOGREL BISULFATE 75 MG PO TABS: 75.0000 mg | ORAL_TABLET | ORAL | Status: DC

## 2018-04-19 MED ORDER — IOHEXOL 350 MG/ML SOLN
INTRAVENOUS | Status: DC | PRN
  Administered 2018-04-19: 143 mL via INTRACARDIAC

## 2018-04-19 MED ORDER — DEXTROSE 50 % IV SOLN
INTRAVENOUS | Status: AC
  Administered 2018-04-19: 12.5 g via INTRAVENOUS
  Filled 2018-04-19: qty 50

## 2018-04-19 MED ORDER — SODIUM CHLORIDE 0.9% FLUSH: 3.0000 mL | Freq: Two times a day (BID) | INTRAVENOUS | Status: DC

## 2018-04-19 MED ORDER — INSULIN ASPART 100 UNIT/ML ~~LOC~~ SOLN
0.0000 [IU] | Freq: Three times a day (TID) | SUBCUTANEOUS | Status: DC
  Administered 2018-04-20: 2 [IU] via SUBCUTANEOUS
  Administered 2018-04-21 (×2): 3 [IU] via SUBCUTANEOUS
  Administered 2018-04-22: 1 [IU] via SUBCUTANEOUS

## 2018-04-19 MED ORDER — SODIUM CHLORIDE 0.9 % IV SOLN
INTRAVENOUS | Status: DC
  Administered 2018-04-19: 08:00:00 via INTRAVENOUS

## 2018-04-19 SURGICAL SUPPLY — 21 items
BALLN SAPPHIRE 2.0X12 (BALLOONS) ×2
BALLN SAPPHIRE ~~LOC~~ 2.25X15 (BALLOONS) ×2 IMPLANT
BALLN ~~LOC~~ EUPHORA RX 2.25X8 (BALLOONS) ×2
BALLOON SAPPHIRE 2.0X12 (BALLOONS) ×1 IMPLANT
BALLOON ~~LOC~~ EUPHORA RX 2.25X8 (BALLOONS) ×1 IMPLANT
CATH INFINITI 5FR MULTPACK ANG (CATHETERS) ×2 IMPLANT
CATH LAUNCHER 6FR EBU 3.75 (CATHETERS) ×2 IMPLANT
KIT ENCORE 26 ADVANTAGE (KITS) ×2 IMPLANT
KIT HEART LEFT (KITS) ×2 IMPLANT
KIT MICROPUNCTURE NIT STIFF (SHEATH) ×2 IMPLANT
PACK CARDIAC CATHETERIZATION (CUSTOM PROCEDURE TRAY) ×2 IMPLANT
SHEATH PINNACLE 5F 10CM (SHEATH) ×2 IMPLANT
SHEATH PINNACLE 6F 10CM (SHEATH) ×2 IMPLANT
SHEATH PROBE COVER 6X72 (BAG) ×2 IMPLANT
STENT SYNERGY DES 2.25X20 (Permanent Stent) ×2 IMPLANT
SYR MEDRAD MARK 7 150ML (SYRINGE) ×2 IMPLANT
TRANSDUCER W/STOPCOCK (MISCELLANEOUS) ×2 IMPLANT
TUBING CIL FLEX 10 FLL-RA (TUBING) ×2 IMPLANT
WIRE ASAHI GRAND SLAM 180CM (WIRE) ×2 IMPLANT
WIRE EMERALD 3MM-J .035X150CM (WIRE) ×2 IMPLANT
WIRE MINAMO 190 (WIRE) ×2 IMPLANT

## 2018-04-19 NOTE — Brief Op Note (Signed)
BRIEF CARDIAC CATHETERIZATION NOTE  04/19/2018  10:39 AM  PATIENT:  Paula Bass  48 y.o. female  PRE-OPERATIVE DIAGNOSIS:  Unstable angina  POST-OPERATIVE DIAGNOSIS:  Unstable angina  PROCEDURE:  Procedure(s): LEFT HEART CATH AND CORONARY ANGIOGRAPHY (N/A) CORONARY STENT INTERVENTION (N/A)  SURGEON:  Surgeon(s) and Role:    * Connell Bognar, Harrell Gave, MD - Primary  FINDINGS: 1. Significant 2-vessel CAD, with 80% mid/distal LCx and 90% mid RCA stenoses. 2. Moderate, non-obstructive LAD disease. 3. Patent stent in proximal RCA. 4. Normal LVEF and LVEDP. 5. Successful PCI to mid/distal LCx using Synergy 2.25 x 20 mm DES with 0% residual stenosis and TIMI-3 flow.  RECOMMENDATIONS: 1. Overnight extended recovery; anticipate d/c home tomorrow AM with HD at outpatient center tomorrow PM. 2. Continue DAPT with ASA and clopidogrel for at least 12 months, ideally longer. 3. Medical therapy of LAD and RCA disease.  If she has recurrent CP, PCI to mid RCA with orbital atherectomy will need to be considered.  [name], MD [facility] Pager: [phone]

## 2018-04-19 NOTE — Progress Notes (Signed)
Site area: right groin  Site Prior to Removal:  Level 0  Pressure Applied For 60 MINUTES    Minutes Beginning at 1455  Manual:   Yes.    Patient Status During Pull:  See below  Post Pull Groin Site:  Level 3  Post Pull Instructions Given:  Yes.    Post Pull Pulses Present:  Yes.    Dressing Applied:  Yes.    Comments:  Hematoma developed during site hold, growing to 22 cm X 11 cm.  Sherlyn Lick assisted with hold.  Patient became nauseous and hypotensive.  Given zofran, morphine, and NS boluses totaling 700 cc.  Rapid response, Bhagat, PA, and Dr. Saunders Revel called to room for assist and evaluation.  CBC ordered but lab delayed in drawing.  Hypotension persists.  Dr. Saunders Revel up to see patient later in evening as well.  Patient with recurring nausea and vomitting, given second dose of zofran as ordered.   Patient hypoglycemic this AM and early afternoon; given 1.5 amps D 50 with good result.

## 2018-04-19 NOTE — Progress Notes (Signed)
CRITICAL VALUE ALERT  Critical Value:  Platelet <5  Date & Time Notied:  04/19/18 2035  Provider Notified: Dr. Shirlee Latch  Orders Received/Actions taken: repeat platelet

## 2018-04-19 NOTE — Interval H&P Note (Signed)
History and Physical Interval Note:  04/19/2018 9:05 AM  Paula Bass  has presented today for cardiac catheterization, with the diagnosis of unstable angina. The various methods of treatment have been discussed with the patient and family. After consideration of risks, benefits and other options for treatment, the patient has consented to  Procedure(s): LEFT HEART CATH AND CORONARY ANGIOGRAPHY (N/A) as a surgical intervention .  The patient's history has been reviewed, patient examined, no change in status, stable for surgery.  I have reviewed the patient's chart and labs.  Questions were answered to the patient's satisfaction.    Cath Lab Visit (complete for each Cath Lab visit)  Clinical Evaluation Leading to the Procedure:   ACS: No.  Non-ACS:    Anginal Classification: CCS IV  Anti-ischemic medical therapy: Maximal Therapy (2 or more classes of medications)  Non-Invasive Test Results: No non-invasive testing performed  Prior CABG: No previous CABG  Rumaldo Difatta

## 2018-04-19 NOTE — Progress Notes (Signed)
[facility] Post-PCI Note  Date: 04/19/18 Time: 6:26 PM  Subjective:  I was alerted this afternoon that the patient developed large right groin hematoma during sheath removal.  This was accompanied by hypotension which resolved with IV fluid bolus.  Upon my arrival, the patient demonstrated low normal blood pressure and was complaining of only mild pain in the right groin.  She denied chest pain and shortness of breath.  She experienced some nausea after eating this evening.  She otherwise feels well.  Phlebotomy is unable to draw CBC due to poor venous access.  Objective: Temp:  [97.6 F (36.4 C)-98.1 F (36.7 C)] 97.6 F (36.4 C) (02/21 1105) Pulse Rate:  [77-133] 84 (02/21 1700) Resp:  [7-59] 24 (02/21 1700) BP: (64-157)/(31-91) 102/69 (02/21 1700) SpO2:  [95 %-100 %] 100 % (02/21 1700) Weight:  [65.8 kg] 65.8 kg (02/21 0705) Gen: Lying flat in bed in no acute distress. Heart: Regular rate and rhythm with 1/6 systolic murmur. Lungs: Clear anteriorly. Extremities: Right femoral arteriotomy site is soft.  There is a soft hematoma extending medially above the pubis.  Right femoral artery pulses 2+.  No bruits appreciated.  Assessment/plan: 48 year old woman admitted post PCI to the mid/distal LCx for unstable angina, complicated by large hematoma during removal of right femoral artery sheath.  She had transient hypotension, which has resolved with IV fluid bolus.  Hopefully, phlebotomy will be able to draw a CBC later this evening.  I recommend deferring PRBC transfusion unless she has symptoms of ischemia or hemoglobin falls below 7.  She will need to remain on aspirin and clopidogrel.  I will hold amlodipine and metoprolol in the setting of soft blood pressure.  Repeat fluid bolus could be considered if she has recurrent hypotension.  However, if that occurs, I would also advocate for unenhanced CT of the abdomen and pelvis to exclude retroperitoneal bleeding.  Clinical status will need  to be reassessed tomorrow morning.  If she is stable for discharge, she can proceed with my dialysis at her outpatient center tomorrow afternoon.  However, in the setting of her groin hematoma today, I suspect that she will need to stay at least 1 more day in which case nephrology will need to be consulted to provide hemodialysis in the inpatient setting.  [name], MD [facility] Pager: [phone]

## 2018-04-19 NOTE — Progress Notes (Signed)
Spoke to Paula Bass at patients diaylsis center.  She states patient needs to arrive at diaylsis center at 12:30 tomorrow for diaylsis.

## 2018-04-19 NOTE — Progress Notes (Signed)
Called to 6c for groin hold assistance. Upon arrival , large area of hematoma from right femoral access. Manual pressure taken over, pressure held over right groin for 30 minutes additional. Groin site softer but hematoma extends from right groin over the bladder to the left left groin.  Patients pressure dropped during groin hold, systolic pressure 93 then 73 then 64 systolic. Dr. Saunders Revel called as well as rapid response.   Groin level 3 , 5 inch by 11 inch across hematoma present over bladder to left groin.  Dr. Saunders Revel present at bedside.  Total groin hold time , 1 hour. Bilateral dp pulses palpable.   Post cath bedrest instructions given.

## 2018-04-20 ENCOUNTER — Encounter (HOSPITAL_COMMUNITY): Payer: Self-pay | Admitting: Surgery

## 2018-04-20 ENCOUNTER — Ambulatory Visit (HOSPITAL_COMMUNITY): Payer: Medicare (Managed Care)

## 2018-04-20 DIAGNOSIS — N186 End stage renal disease: Secondary | ICD-10-CM

## 2018-04-20 DIAGNOSIS — I2584 Coronary atherosclerosis due to calcified coronary lesion: Secondary | ICD-10-CM | POA: Diagnosis not present

## 2018-04-20 DIAGNOSIS — D631 Anemia in chronic kidney disease: Secondary | ICD-10-CM | POA: Diagnosis not present

## 2018-04-20 DIAGNOSIS — Z955 Presence of coronary angioplasty implant and graft: Secondary | ICD-10-CM | POA: Diagnosis not present

## 2018-04-20 DIAGNOSIS — N2581 Secondary hyperparathyroidism of renal origin: Secondary | ICD-10-CM | POA: Diagnosis not present

## 2018-04-20 DIAGNOSIS — I200 Unstable angina: Secondary | ICD-10-CM | POA: Diagnosis not present

## 2018-04-20 DIAGNOSIS — I25110 Atherosclerotic heart disease of native coronary artery with unstable angina pectoris: Secondary | ICD-10-CM | POA: Diagnosis not present

## 2018-04-20 DIAGNOSIS — Z992 Dependence on renal dialysis: Secondary | ICD-10-CM | POA: Diagnosis not present

## 2018-04-20 DIAGNOSIS — E875 Hyperkalemia: Secondary | ICD-10-CM | POA: Diagnosis not present

## 2018-04-20 DIAGNOSIS — I120 Hypertensive chronic kidney disease with stage 5 chronic kidney disease or end stage renal disease: Secondary | ICD-10-CM | POA: Diagnosis not present

## 2018-04-20 DIAGNOSIS — E1122 Type 2 diabetes mellitus with diabetic chronic kidney disease: Secondary | ICD-10-CM | POA: Diagnosis not present

## 2018-04-20 DIAGNOSIS — S301XXD Contusion of abdominal wall, subsequent encounter: Secondary | ICD-10-CM

## 2018-04-20 LAB — BASIC METABOLIC PANEL
Anion gap: 13 (ref 5–15)
BUN: 48 mg/dL — ABNORMAL HIGH (ref 6–20)
CO2: 21 mmol/L — ABNORMAL LOW (ref 22–32)
Calcium: 8.4 mg/dL — ABNORMAL LOW (ref 8.9–10.3)
Chloride: 100 mmol/L (ref 98–111)
Creatinine, Ser: 9 mg/dL — ABNORMAL HIGH (ref 0.44–1.00)
GFR calc Af Amer: 5 mL/min — ABNORMAL LOW (ref >60)
GFR calc non Af Amer: 5 mL/min — ABNORMAL LOW (ref >60)
Glucose, Bld: 117 mg/dL — ABNORMAL HIGH (ref 70–99)
Potassium: 6 mmol/L — ABNORMAL HIGH (ref 3.5–5.1)
Sodium: 134 mmol/L — ABNORMAL LOW (ref 135–145)

## 2018-04-20 LAB — CBC
HCT: 26.3 % — ABNORMAL LOW (ref 36.0–46.0)
Hemoglobin: 7.8 g/dL — ABNORMAL LOW (ref 12.0–15.0)
MCH: 28.6 pg (ref 26.0–34.0)
MCHC: 29.7 g/dL — ABNORMAL LOW (ref 30.0–36.0)
MCV: 96.3 fL (ref 80.0–100.0)
PLATELETS: 189 10*3/uL (ref 150–400)
RBC: 2.73 MIL/uL — ABNORMAL LOW (ref 3.87–5.11)
RDW: 19.3 % — ABNORMAL HIGH (ref 11.5–15.5)
WBC: 13.2 10*3/uL — ABNORMAL HIGH (ref 4.0–10.5)
nRBC: 0 % (ref 0.0–0.2)

## 2018-04-20 LAB — RENAL FUNCTION PANEL
Albumin: 2.8 g/dL — ABNORMAL LOW (ref 3.5–5.0)
Anion gap: 13 (ref 5–15)
BUN: 47 mg/dL — ABNORMAL HIGH (ref 6–20)
CO2: 23 mmol/L (ref 22–32)
Calcium: 8.3 mg/dL — ABNORMAL LOW (ref 8.9–10.3)
Chloride: 99 mmol/L (ref 98–111)
Creatinine, Ser: 9.13 mg/dL — ABNORMAL HIGH (ref 0.44–1.00)
GFR calc Af Amer: 5 mL/min — ABNORMAL LOW
GFR calc non Af Amer: 5 mL/min — ABNORMAL LOW
Glucose, Bld: 116 mg/dL — ABNORMAL HIGH (ref 70–99)
Phosphorus: 5.6 mg/dL — ABNORMAL HIGH (ref 2.5–4.6)
Potassium: 6.1 mmol/L — ABNORMAL HIGH (ref 3.5–5.1)
Sodium: 135 mmol/L (ref 135–145)

## 2018-04-20 LAB — GLUCOSE, CAPILLARY
GLUCOSE-CAPILLARY: 268 mg/dL — AB (ref 70–99)
Glucose-Capillary: 84 mg/dL (ref 70–99)

## 2018-04-20 MED ORDER — LIDOCAINE HCL (PF) 1 % IJ SOLN: 5.0000 mL | INTRAMUSCULAR | Status: DC | PRN

## 2018-04-20 MED ORDER — SODIUM CHLORIDE 0.9 % IV SOLN: 100.0000 mL | INTRAVENOUS | Status: DC | PRN

## 2018-04-20 MED ORDER — CHLORHEXIDINE GLUCONATE CLOTH 2 % EX PADS
6.0000 | MEDICATED_PAD | Freq: Every day | CUTANEOUS | Status: DC
  Administered 2018-04-20: 10:00:00 6 via TOPICAL

## 2018-04-20 MED ORDER — DIPHENHYDRAMINE HCL 50 MG/ML IJ SOLN
INTRAMUSCULAR | Status: AC
  Administered 2018-04-20: 25 mg via INTRAVENOUS
  Filled 2018-04-20: qty 1

## 2018-04-20 MED ORDER — PENTAFLUOROPROP-TETRAFLUOROETH EX AERO: 1.0000 "application " | INHALATION_SPRAY | CUTANEOUS | Status: DC | PRN

## 2018-04-20 MED ORDER — DIPHENHYDRAMINE HCL 50 MG/ML IJ SOLN
25.0000 mg | Freq: Once | INTRAMUSCULAR | Status: AC
  Administered 2018-04-20: 25 mg via INTRAVENOUS

## 2018-04-20 MED ORDER — ONDANSETRON HCL 4 MG/2ML IJ SOLN
INTRAMUSCULAR | Status: AC
  Administered 2018-04-20: 4 mg via INTRAVENOUS
  Filled 2018-04-20: qty 2

## 2018-04-20 MED ORDER — HEPARIN SODIUM (PORCINE) 1000 UNIT/ML DIALYSIS
1000.0000 [IU] | INTRAMUSCULAR | Status: DC | PRN
  Filled 2018-04-20: qty 1

## 2018-04-20 MED ORDER — ALTEPLASE 2 MG IJ SOLR: 2.0000 mg | Freq: Once | INTRAMUSCULAR | Status: DC | PRN

## 2018-04-20 MED ORDER — PRO-STAT SUGAR FREE PO LIQD
30.0000 mL | Freq: Two times a day (BID) | ORAL | Status: DC
  Administered 2018-04-21 – 2018-04-22 (×3): 30 mL via ORAL
  Filled 2018-04-20 (×3): qty 30

## 2018-04-20 MED ORDER — LIDOCAINE-PRILOCAINE 2.5-2.5 % EX CREA
1.0000 "application " | TOPICAL_CREAM | CUTANEOUS | Status: DC | PRN
  Filled 2018-04-20: qty 5

## 2018-04-20 NOTE — Progress Notes (Signed)
Called by renal service secondary to drop in Hgb- 11.5 on 2/17 to 7.8 on 2/22. They advise transfusion and will order her to be transfused while in dialysis. I'll go ahead an order non contrast abdomin and pelvis CT. F/U Hgb in am.  Kerin Ransom PA-C 04/20/2018 3:31 PM

## 2018-04-20 NOTE — Progress Notes (Signed)
CARDIAC REHAB PHASE I   PRE:  Rate/Rhythm: 108 ST  BP:  Supine: 145/84  Sitting:   Standing:    SaO2: 100%RA  MODE:  Ambulation:  ft  around bed  POST:  Rate/Rhythm: 127 ST  BP:  Supine:   Sitting: 139/75  Standing:    SaO2: 100%RA 0915-1002 Pt only able to walk around bed with gait belt use, rolling walker and asst x1. C/o legs weak, neuropathy, dizziness and groin pain. Assisted back to bed. No c/o CP. Encouraged pt to try to walk with staff later when feeling better. Discussed importance of plavix with stent. Reviewed NTG use, modified walking instructions when groin healed, CRP 2 [place]. Referred diet instruction to dietitan at HD. Referred to [place] CRP 2. Pt has dialysis on TTHS. Not sure if she will feel up to CRP 2 also. She will consider.   Graylon Good, RN BSN  04/20/2018 9:57 AM

## 2018-04-20 NOTE — Progress Notes (Signed)
Progress Note  Patient Name: Paula Bass Date of Encounter: 04/20/2018  Primary Cardiologist: Dr. Liam Rogers  Subjective   No chest pain this morning.  Some soreness in the right groin area, no abdominal pain however.  No nausea or emesis.  Inpatient Medications    Scheduled Meds: . sodium chloride   Intravenous Once  . aspirin EC  81 mg Oral Daily  . atorvastatin  40 mg Oral Daily  . Chlorhexidine Gluconate Cloth  6 each Topical Q0600  . clopidogrel  75 mg Oral Daily  . insulin aspart  0-9 Units Subcutaneous TID WC  . multivitamin  1 tablet Oral QHS  . pantoprazole  40 mg Oral Daily  . predniSONE  10 mg Oral Daily  . sodium chloride flush  3 mL Intravenous Q12H   Continuous Infusions: . sodium chloride     PRN Meds: sodium chloride, acetaminophen, hyoscyamine, morphine injection, ondansetron (ZOFRAN) IV, promethazine **OR** promethazine **OR** promethazine, sodium chloride flush   Vital Signs    Vitals:   04/19/18 1645 04/19/18 1700 04/19/18 1934 04/20/18 0306  BP: 104/70 102/69 102/76 134/68  Pulse: 99 84 75 100  Resp: (!) 22 (!) 24 (!) 25 (!) 27  Temp:   97.7 F (36.5 C) 98.3 F (36.8 C)  TempSrc:   Oral Oral  SpO2: 100% 100% 100% 100%  Weight:    65.5 kg  Height:        Intake/Output Summary (Last 24 hours) at 04/20/2018 0904 Last data filed at 04/19/2018 1600 Gross per 24 hour  Intake 940 ml  Output -  Net 940 ml   Filed Weights   04/19/18 0705 04/20/18 0306  Weight: 65.8 kg 65.5 kg    Telemetry    Sinus rhythm.  Personally reviewed.  ECG    From 04/20/2018 shows normal sinus rhythm.  Personally reviewed.  Physical Exam   GEN: No acute distress.   Neck: No JVD. Cardiac: RRR, systolic murmur, no gallop.  Respiratory: Nonlabored. Clear to auscultation bilaterally. GI: Soft, nontender, bowel sounds present.  Small linear area of firmness along right inguinal ligament, otherwise soft at marked borders of hematoma. MS: No edema; No  deformity. Neuro:  Nonfocal. Psych: Alert and oriented x 3. Normal affect.  Labs    Chemistry Recent Labs  Lab 04/15/18 0948 04/20/18 0515  NA 140 134*  K 4.7 6.0*  CL 99 100  CO2 23 21*  GLUCOSE 122* 117*  BUN 43* 48*  CREATININE 8.99* 9.00*  CALCIUM 9.6 8.4*  PROT 6.7  --   ALBUMIN 3.7*  --   AST 14  --   ALT 9  --   ALKPHOS 118*  --   BILITOT 0.3  --   GFRNONAA 5* 5*  GFRAA 5* 5*  ANIONGAP  --  13     Hematology Recent Labs  Lab 04/19/18 1856 04/19/18 2115 04/20/18 0515  WBC 3.5* 17.1* 13.2*  RBC 2.91* 2.92* 2.73*  HGB 8.6* 8.8* 7.8*  HCT 27.7* 28.0* 26.3*  MCV 95.2 95.9 96.3  MCH 29.6 30.1 28.6  MCHC 31.0 31.4 29.7*  RDW 18.6* 19.0* 19.3*  PLT <5* 176 189    Radiology    No results found.  Cardiac Studies   Cardiac catheterization 04/19/2018: Conclusions: 1. Significant two-vessel CAD, with 80-90% mid/distal LCx and 90% mid RCA stenoses. 2. Moderate, non-obstructive LAD and proximal LCx/OM disease. 3. Patent stent in proximal RCA. 4. Normal left ventricular systolic function and filling pressure. 5. Successful PCI  to mid/distal LCx using Synergy 2.25 x 20 mm DES with 0% residual stenosis and TIMI-3 flow.  Recommendations: 1. Overnight extended recovery; anticipate d/c home tomorrow AM with HD at outpatient center tomorrow PM. 2. Continue dual antiplatelet therapy with aspirin and clopidogrel for at least 12 months, ideally longer. 3. Medical therapy of LAD and RCA disease. If she has recurrent CP, PCI to mid RCA with orbital atherectomy will need to be considered.  Patient Profile     48 y.o. female with a history of ESRD on hemodialysis, type 2 diabetes mellitus, hypertension, lupus, and history of CAD with previous RCA stent in [place] back in 2017.  She underwent cardiac catheterization yesterday in the setting of unstable angina and ultimately placement of DES to the mid to distal circumflex with otherwise plan to manage RCA disease  medically.  Assessment & Plan    1.  CAD status post stent intervention to the RCA in [place] back in 2017, now status post DES to the mid to distal circumflex on 2/21 in the setting of unstable angina.  90% mid RCA stenosis was managed medically at that time.  She is on aspirin and Plavix, currently chest pain-free.  2.  Large right groin hematoma following cardiac catheterization, improved at this time with small residual hematoma noted.  No abdominal or pelvic pain.  Hemoglobin 8.8 down to 7.8.  Hemodynamically stable.  3.  ESRD on hemodialysis.  4.  Mixed hyperlipidemia, on Lipitor.  5.  Essential hypertension.  Plan to observe patient one more day to ensure stability in right groin hematoma and no further decline in hemoglobin.  Holding off on abdominal/pelvic CT at this time.  Ambulate with assistance.  She will need hemodialysis session today, nephrology service contacted.  Otherwise continue medical therapy and follow-up CBC in the morning.  Signed, Rozann Lesches, MD  04/20/2018, 9:04 AM

## 2018-04-20 NOTE — Consult Note (Addendum)
[facility] Renal Consultation Note    Indication for Consultation:  Management of ESRD/hemodialysis; anemia, hypertension/volume and secondary hyperparathyroidism PCP: Dr. Isla Pence  HPI: Paula Bass is a 48 y.o. female with ESRD on hemodialysis T,Th,S at [facility]. PMH significant for lupus, HTN, DM, CAD. obesity, GIB, AOCD, SHPT. Last HD 04/18/2018, ran full treatment, left 0.2 kg above OP EDW.   Patient was referred to cardiology by Dr. Marval Regal for evaluation of chest pain during dialysis. She had cardiac cath 04/19/2018 which revealed 2 vessel CAD with 80-90% mid/distal LCX and 90% mid RCA stenosis. Patent stent prox RCA, S/T PCI to mid/distal LCX DES with 0% residual stenosis and TIMI-3 flow post cath. She developed large R groin hematoma post cath, HGB fell from 8.8 to 7.8. HGB was 11.2 04/18/2018.   Past Medical History:  Diagnosis Date  . CAD (coronary artery disease) 2016  . Diabetes mellitus without complication ([place])   . Dialysis patient ([place])   . Diverticulitis   . GI bleeding   . Hypertension   . Lupus ([place])   . Renal disorder    dialysis   Past Surgical History:  Procedure Laterality Date  . CARDIAC SURGERY     cardiac shunt  . Kidney graft Left    Family History  Problem Relation Age of Onset  . Colon cancer Neg Hx   . Esophageal cancer Neg Hx   . Breast cancer Neg Hx    Social History:  reports that she has never smoked. She has never used smokeless tobacco. She reports that she does not drink alcohol or use drugs. Allergies  Allergen Reactions  . Penicillins Hives    DID THE REACTION INVOLVE: Swelling of the face/tongue/throat, SOB, or low BP? Yes Sudden or severe rash/hives, skin peeling, or the inside of the mouth or nose? Yes Did it require medical treatment? No When did it last happen?Within the past 10 years If all above answers are "NO", may proceed with cephalosporin use.    Prior to Admission  medications   Medication Sig Start Date End Date Taking? Authorizing Provider  amLODipine (NORVASC) 10 MG tablet Take 10 mg by mouth daily. 11/23/17  Yes [provider]  aspirin EC 81 MG tablet Take 1 tablet (81 mg total) by mouth daily. Hold for next 2 weeks and resume on 01/02/2018 Patient taking differently: Take 81 mg by mouth daily.  01/02/18  Yes Elgergawy, Silver Huguenin, MD  atorvastatin (LIPITOR) 40 MG tablet Take 40 mg by mouth daily.  10/25/17  Yes [provider]  clopidogrel (PLAVIX) 75 MG tablet Take 1 tablet (75 mg total) by mouth daily. Hold for next 2 weeks Patient taking differently: Take 75 mg by mouth daily.  01/02/18  Yes Elgergawy, Silver Huguenin, MD  hyoscyamine (LEVSIN SL) 0.125 MG SL tablet Place 1 tablet (0.125 mg total) under the tongue every 6 (six) hours as needed. Patient taking differently: Place 0.125 mg under the tongue every 6 (six) hours as needed for cramping.  03/06/18  Yes Levin Erp, PA  insulin lispro (HUMALOG) 100 UNIT/ML injection Inject 30-40 Units into the skin See admin instructions. Inject 40 units into the skin before breakfast and 30 units at bedtime    Yes [provider]  lidocaine-prilocaine (EMLA) cream Apply 1 application topically Every Tuesday,Thursday,and Saturday with dialysis.  11/18/17  Yes [provider]  metoprolol tartrate (LOPRESSOR) 25 MG tablet Take 25 mg by mouth 2 (two) times daily. 11/23/17  Yes [provider]  multivitamin (RENA-VIT) TABS tablet Take 1 tablet by mouth daily.   Yes [provider]  nystatin (MYCOSTATIN/NYSTOP) powder Apply topically 4 (four) times daily. Patient taking differently: Apply 1 g topically 4 (four) times daily as needed (skin irritation/yeast).  03/04/18  Yes Leftwich-Kirby, Kathie Dike, CNM  pantoprazole (PROTONIX) 40 MG tablet Take 1 tablet (40 mg total) by mouth daily. 03/06/18  Yes Lemmon, Lavone Nian, PA  Polyethyl Glycol-Propyl Glycol (LUBRICANT EYE DROPS)  0.4-0.3 % SOLN Place 1 drop into both eyes 3 (three) times daily as needed (dry/irritated eyes.).   Yes [provider]  predniSONE (DELTASONE) 10 MG tablet Take 10 mg by mouth daily.  03/12/18  Yes [provider]   Current Facility-Administered Medications  Medication Dose Route Frequency Provider Last Rate Last Dose  . 0.9 %  sodium chloride infusion (Manually program via Guardrails IV Fluids)   Intravenous Once Sinda Du A, MD 999 mL/hr at 04/19/18 1600    . 0.9 %  sodium chloride infusion  250 mL Intravenous PRN End, Harrell Gave, MD      . acetaminophen (TYLENOL) tablet 650 mg  650 mg Oral Q4H PRN End, Harrell Gave, MD      . aspirin EC tablet 81 mg  81 mg Oral Daily End, Christopher, MD   81 mg at 04/20/18 0850  . atorvastatin (LIPITOR) tablet 40 mg  40 mg Oral Daily End, Christopher, MD   40 mg at 04/20/18 0850  . Chlorhexidine Gluconate Cloth 2 % PADS 6 each  6 each Topical Q0600 Valentina Gu, NP   6 each at 04/20/18 [phone]  . clopidogrel (PLAVIX) tablet 75 mg  75 mg Oral Daily End, Christopher, MD   75 mg at 04/20/18 0849  . hyoscyamine (LEVSIN SL) SL tablet 0.125 mg  0.125 mg Sublingual Q6H PRN End, Harrell Gave, MD      . insulin aspart (novoLOG) injection 0-9 Units  0-9 Units Subcutaneous TID WC End, Christopher, MD      . morphine 2 MG/ML injection 2 mg  2 mg Intravenous Q4H PRN Bhagat, Bhavinkumar, PA   2 mg at 04/19/18 1537  . multivitamin (RENA-VIT) tablet 1 tablet  1 tablet Oral QHS End, Christopher, MD      . ondansetron (ZOFRAN) injection 4 mg  4 mg Intravenous Q6H PRN Bhagat, Bhavinkumar, PA   4 mg at 04/19/18 1535  . pantoprazole (PROTONIX) EC tablet 40 mg  40 mg Oral Daily End, Christopher, MD   40 mg at 04/20/18 0850  . predniSONE (DELTASONE) tablet 10 mg  10 mg Oral Daily End, Christopher, MD   10 mg at 04/20/18 0851  . promethazine (PHENERGAN) tablet 25 mg  25 mg Oral Q6H PRN Wilmot, Kobina A, MD       Or  . promethazine (PHENERGAN) injection  12.5-25 mg  12.5-25 mg Intravenous Q6H PRN Wilmot, Donalynn Furlong, MD       Or  . promethazine (PHENERGAN) suppository 25 mg  25 mg Rectal Q6H PRN Wilmot, Kobina A, MD      . sodium chloride flush (NS) 0.9 % injection 3 mL  3 mL Intravenous Q12H End, Christopher, MD   3 mL at 04/20/18 0951  . sodium chloride flush (NS) 0.9 % injection 3 mL  3 mL Intravenous PRN End, Harrell Gave, MD       Labs: Basic Metabolic Panel: Recent Labs  Lab 04/15/18 0948 04/20/18 0515  NA 140 135  134*  K 4.7 6.1*  6.0*  CL 99 99  100  CO2 23 23  21*  GLUCOSE 122* 116*  117*  BUN 43* 47*  48*  CREATININE 8.99* 9.13*  9.00*  CALCIUM 9.6 8.3*  8.4*  PHOS  --  5.6*   Liver Function Tests: Recent Labs  Lab 04/15/18 0948 04/20/18 0515  AST 14  --   ALT 9  --   ALKPHOS 118*  --   BILITOT 0.3  --   PROT 6.7  --   ALBUMIN 3.7* 2.8*   No results for input(s): LIPASE, AMYLASE in the last 168 hours. No results for input(s): AMMONIA in the last 168 hours. CBC: Recent Labs  Lab 04/15/18 0948 04/19/18 1856 04/19/18 2115 04/20/18 0515  WBC 11.6* 3.5* 17.1* 13.2*  NEUTROABS  --   --  14.7*  --   HGB 11.5 8.6* 8.8* 7.8*  HCT 35.5 27.7* 28.0* 26.3*  MCV 92 95.2 95.9 96.3  PLT 285 <5* 176 189   Cardiac Enzymes: No results for input(s): CKTOTAL, CKMB, CKMBINDEX, TROPONINI in the last 168 hours. CBG: Recent Labs  Lab 04/19/18 1202 04/19/18 1304 04/19/18 1622 04/19/18 2123 04/20/18 0648  GLUCAP 60* 157* 165* 205* 84   Iron Studies: No results for input(s): IRON, TIBC, TRANSFERRIN, FERRITIN in the last 72 hours. Studies/Results: No results found.  ROS: As per HPI otherwise negative.   Physical Exam: Vitals:   04/20/18 0306 04/20/18 0925 04/20/18 1025 04/20/18 1038  BP: 134/68 (!) 145/84 121/70   Pulse: 100 (!) 104 (!) 109 (!) 115  Resp: (!) 27 19 20 18   Temp: 98.3 F (36.8 C) 98.7 F (37.1 C) 100.2 F (37.9 C)   TempSrc: Oral Oral Oral   SpO2: 100% 100%  99%  Weight: 65.5 kg  65.9  kg   Height:   5\' 1"  (1.549 m)      General: Well developed, well nourished, in no acute distress. Head: Normocephalic, atraumatic, sclera non-icteric, mucus membranes are moist Neck: Supple. JVD not elevated. Lungs: Clear bilaterally to auscultation without wheezes, rales, or rhonchi. Breathing is unlabored. Heart: RRR with S1 S2. 2/6 systolic M. ST on monitor. Tachy HR 100s.  Abdomen: Soft, tender LLQ, No flank pain. non-distended with normoactive bowel sounds.  M-S:  Strength and tone appear normal for age. Lower extremities:without edema or ischemic changes, no open wounds. Hematoma noted R groin, tender to touch.  Neuro: Alert and oriented X 3. Moves all extremities spontaneously. Psych:  Responds to questions appropriately with a normal affect. Dialysis Access: L AVG + bruit.   Dialysis Orders: [facility] T,Th,S 3 hrs 15 min 180 NRe 400/800 65 kg 3.0 K/2.25 Ca  L AVG -No heparin -Mircera 75 mcg IV q 2 weeks (last dose 04/16/18 Last HGB 11.2 04/18/18)/  -Venofer 50 mg IV q week (last dose 04/16/18 Last Tsat 22 03/21/18)/  Assessment/Plan: 1.  CAD: S/P PCI of LCX-DAPT for 1 year. No further CP since procedure.Per primary 2.  R groin hematoma. HGB 11.2 04/18/18 now down to 7.8. Symptomatic, feels dizzy. Will recheck CBC prior to HD. Transfuse 1 unit of PRBCS. Concern for active bleeding/retroperitoneal bleed. Discussed with L. Kilroy, PAC, getting CT of abd/pelvis. 3.  Hyperkalemia: K+ 6.0 today. Will have HD ASAP today on schedule.  4.  ESRD - T,Th,S HD today on schedule. 5.  Hypertension/volume  -BP well controlled. No evidence of excess volume. Slightly above OP EDW 65.9. Says she has been feeling weak post HD. Thinks EDW too low. Consider  raising 0.5 kg on DC.  6.  Anemia  - As noted # 2. Recent OP ESA dose. Will transfuse 1 unit of PRBCS today.  7.  Metabolic bone disease -  Ca 8.3 Phos 5.6 Continue auryixa binders, VDRA.   8.  Nutrition - Albumin 2.8 renal/carb mod diet, add  prostat, renal vit. 9.  DM-per primary  Jimmye Norman. Owens Shark, NP-C 04/20/2018, 2:25 PM  [employer] [phone]  Pt seen, examined and agree w A/P as above. ESRD pt admitted for chest pain / heart cath, had prior RCA stent , cath showed LCX tight lesion and distal RCA lesion.  LCX lesion stented. Post cath had development of R groin hematoma / pain.  Hb drop to 7.8.  Bp's stable, no hypotension, +^HR.  Repeat CBC pre HD tonight and current plan to give 1 unit prbc's w/ HD. Will follow. No heparin on HD.  [facility] 04/20/2018, 6:13 PM

## 2018-04-21 ENCOUNTER — Encounter (HOSPITAL_COMMUNITY): Payer: Self-pay

## 2018-04-21 DIAGNOSIS — I120 Hypertensive chronic kidney disease with stage 5 chronic kidney disease or end stage renal disease: Secondary | ICD-10-CM | POA: Diagnosis not present

## 2018-04-21 DIAGNOSIS — I25110 Atherosclerotic heart disease of native coronary artery with unstable angina pectoris: Secondary | ICD-10-CM | POA: Diagnosis not present

## 2018-04-21 DIAGNOSIS — Z955 Presence of coronary angioplasty implant and graft: Secondary | ICD-10-CM | POA: Diagnosis not present

## 2018-04-21 DIAGNOSIS — I2584 Coronary atherosclerosis due to calcified coronary lesion: Secondary | ICD-10-CM | POA: Diagnosis not present

## 2018-04-21 DIAGNOSIS — E875 Hyperkalemia: Secondary | ICD-10-CM | POA: Diagnosis not present

## 2018-04-21 DIAGNOSIS — E1122 Type 2 diabetes mellitus with diabetic chronic kidney disease: Secondary | ICD-10-CM | POA: Diagnosis not present

## 2018-04-21 DIAGNOSIS — I200 Unstable angina: Secondary | ICD-10-CM | POA: Diagnosis not present

## 2018-04-21 DIAGNOSIS — Z992 Dependence on renal dialysis: Secondary | ICD-10-CM | POA: Diagnosis not present

## 2018-04-21 DIAGNOSIS — S301XXD Contusion of abdominal wall, subsequent encounter: Secondary | ICD-10-CM | POA: Diagnosis not present

## 2018-04-21 DIAGNOSIS — N186 End stage renal disease: Secondary | ICD-10-CM | POA: Diagnosis not present

## 2018-04-21 DIAGNOSIS — N2581 Secondary hyperparathyroidism of renal origin: Secondary | ICD-10-CM | POA: Diagnosis not present

## 2018-04-21 DIAGNOSIS — D631 Anemia in chronic kidney disease: Secondary | ICD-10-CM | POA: Diagnosis not present

## 2018-04-21 LAB — BASIC METABOLIC PANEL
Anion gap: 11 (ref 5–15)
BUN: 20 mg/dL (ref 6–20)
CO2: 27 mmol/L (ref 22–32)
CREATININE: 5.47 mg/dL — AB (ref 0.44–1.00)
Calcium: 8.2 mg/dL — ABNORMAL LOW (ref 8.9–10.3)
Chloride: 98 mmol/L (ref 98–111)
GFR calc Af Amer: 10 mL/min — ABNORMAL LOW (ref >60)
GFR calc non Af Amer: 9 mL/min — ABNORMAL LOW (ref >60)
Glucose, Bld: 114 mg/dL — ABNORMAL HIGH (ref 70–99)
Potassium: 3.8 mmol/L (ref 3.5–5.1)
Sodium: 136 mmol/L (ref 135–145)

## 2018-04-21 LAB — GLUCOSE, CAPILLARY
Glucose-Capillary: 88 mg/dL (ref 70–99)
Glucose-Capillary: 206 mg/dL — ABNORMAL HIGH (ref 70–99)
Glucose-Capillary: 209 mg/dL — ABNORMAL HIGH (ref 70–99)
Glucose-Capillary: 316 mg/dL — ABNORMAL HIGH (ref 70–99)

## 2018-04-21 LAB — CBC
HCT: 22.4 % — ABNORMAL LOW (ref 36.0–46.0)
Hemoglobin: 6.9 g/dL — CL (ref 12.0–15.0)
MCH: 29.1 pg (ref 26.0–34.0)
MCHC: 30.8 g/dL (ref 30.0–36.0)
MCV: 94.5 fL (ref 80.0–100.0)
Platelets: 160 10*3/uL (ref 150–400)
RBC: 2.37 MIL/uL — ABNORMAL LOW (ref 3.87–5.11)
RDW: 18.7 % — ABNORMAL HIGH (ref 11.5–15.5)
WBC: 9.4 10*3/uL (ref 4.0–10.5)
nRBC: 0 % (ref 0.0–0.2)

## 2018-04-21 LAB — HEMOGLOBIN AND HEMATOCRIT, BLOOD
HCT: 30.9 % — ABNORMAL LOW (ref 36.0–46.0)
Hemoglobin: 10 g/dL — ABNORMAL LOW (ref 12.0–15.0)

## 2018-04-21 LAB — PREPARE RBC (CROSSMATCH)

## 2018-04-21 MED ORDER — METOPROLOL TARTRATE 12.5 MG HALF TABLET
12.5000 mg | ORAL_TABLET | Freq: Two times a day (BID) | ORAL | Status: DC
  Administered 2018-04-21 – 2018-04-22 (×3): 12.5 mg via ORAL
  Filled 2018-04-21 (×3): qty 1

## 2018-04-21 MED ORDER — SODIUM CHLORIDE 0.9% IV SOLUTION
Freq: Once | INTRAVENOUS | Status: AC
  Administered 2018-04-21: 08:00:00 via INTRAVENOUS

## 2018-04-21 NOTE — Progress Notes (Signed)
HD tx ended 15 min early @ 0045 d/t nausea secondary to bp drop, 200 ml NS bolus and UF was turned off, as bp came up in the 90's pt stated she didn't want UF turned back on as that was too low for her and d/t nausea UF goal not met Blood rinsed back VSS Report called to Kennieth Francois, RN

## 2018-04-21 NOTE — Progress Notes (Deleted)
[facility] Progress Note  Subjective: feeling good, got HD last night. Some pain R groin, no other c/o.  CT abd showed no intra-abd bleeds.   Vitals:   04/21/18 0847 04/21/18 1147 04/21/18 1203 04/21/18 1218  BP: 132/86 134/75 134/75 129/72  Pulse: (!) 104 (!) 102 (!) 102 96  Resp: 18 18 18 17   Temp: 98.6 F (37 C) 98.6 F (37 C) 98.6 F (37 C) 99.5 F (37.5 C)  TempSrc: Oral Oral Oral Oral  SpO2: 98% 92% 92% 94%  Weight:      Height:        Inpatient medications: . sodium chloride   Intravenous Once  . aspirin EC  81 mg Oral Daily  . atorvastatin  40 mg Oral Daily  . Chlorhexidine Gluconate Cloth  6 each Topical Q0600  . clopidogrel  75 mg Oral Daily  . feeding supplement (PRO-STAT SUGAR FREE 64)  30 mL Oral BID  . insulin aspart  0-9 Units Subcutaneous TID WC  . metoprolol tartrate  12.5 mg Oral BID  . multivitamin  1 tablet Oral QHS  . pantoprazole  40 mg Oral Daily  . predniSONE  10 mg Oral Daily  . sodium chloride flush  3 mL Intravenous Q12H   . sodium chloride    . sodium chloride    . sodium chloride     sodium chloride, sodium chloride, sodium chloride, acetaminophen, alteplase, heparin, hyoscyamine, lidocaine (PF), lidocaine-prilocaine, morphine injection, ondansetron (ZOFRAN) IV, pentafluoroprop-tetrafluoroeth, promethazine **OR** promethazine **OR** promethazine, sodium chloride flush  Iron/TIBC/Ferritin/ %Sat No results found for: IRON, TIBC, FERRITIN, IRONPCTSAT  Exam: General: Well developed, well nourished, in no acute distress. Head: Normocephalic, atraumatic, sclera non-icteric, mucus membranes are moist Neck: Supple. JVD not elevated. Lungs: Clear bilaterally to auscultation without wheezes, rales, or rhonchi. Breathing is unlabored. Heart: RRR with S1 S2. 2/6 systolic M. ST on monitor. Tachy HR 100s.  Abdomen: Soft, tender LLQ, No flank pain. non-distended with normoactive bowel sounds.  M-S:  Strength and tone appear normal for  age. Lower extremities:without edema or ischemic changes, no open wounds. Hematoma noted R groin, tender to touch.  Neuro: Alert and oriented X 3. Moves all extremities spontaneously. Psych:  Responds to questions appropriately with a normal affect. Dialysis Access: L AVG + bruit.   [place] TTS 3h 53min  400/800  65kg   3K/2.25 bath Hep none  L AVG -Mircera 75 mcg IV q 2 weeks (last dose 04/16/18 Last HGB 11.2 04/18/18)/  -Venofer 50 mg IV q week (last dose 04/16/18 Last Tsat 22 03/21/18)/  Assessment/Plan: 1. 2V CAD/ hx prior RCA stent: S/P PCI of LCX on 2/21. Plan DAPT for 1 year. No further CP since procedure. 2.  R groin hematoma. HGB 11.2 on 2/20 now down to 7 range. Will give 2u prbc's today.  CT abd neg for intra-abd bleed.  3.  Hyperkalemia: resolved  4.  ESRD - TTS HD. Had HD last night.  5.  Hypertension/volume  -BP good, at dry wt 6.  Anemia ABL/ CKD - As noted # 2. Recent OP ESA dose.  7.  Metabolic bone disease -  Ca 8.3 Phos 5.6 Continue auryixa binders, VDRA.   8.  Nutrition - Albumin 2.8 renal/carb mod diet, add prostat, renal vit. 9. DM-per primary  [facility] 04/21/2018, 1:18 PM  Recent Labs  Lab 04/15/18 0948 04/20/18 0515 04/21/18 0455  NA 140 135  134* 136  K 4.7 6.1*  6.0* 3.8  CL 99 99  100 98  CO2 23 23  21* 27  GLUCOSE 122* 116*  117* 114*  BUN 43* 47*  48* 20  CREATININE 8.99* 9.13*  9.00* 5.47*  CALCIUM 9.6 8.3*  8.4* 8.2*  PHOS  --  5.6*  --   ALBUMIN 3.7* 2.8*  --    Recent Labs  Lab 04/15/18 0948  AST 14  ALT 9  ALKPHOS 118*  BILITOT 0.3  PROT 6.7   Recent Labs  Lab 04/19/18 2115 04/20/18 0515 04/21/18 0455  WBC 17.1* 13.2* 9.4  NEUTROABS 14.7*  --   --   HGB 8.8* 7.8* 6.9*  HCT 28.0* 26.3* 22.4*  MCV 95.9 96.3 94.5  PLT 176 189 160

## 2018-04-21 NOTE — Progress Notes (Signed)
HD tx initiated via 15Gx2 w/o problem Pull/push/flush well w/o problem VSS Will continue to monitor while on HD tx 

## 2018-04-21 NOTE — Progress Notes (Addendum)
[facility] Progress Note   Subjective: Says she feels better today. 2nd unit PRBCs infusing. Denies CP/SOB. Still tenderness RLQ of abdomen, R groin.   Objective Vitals:   04/21/18 0847 04/21/18 1147 04/21/18 1203 04/21/18 1218  BP: 132/86 134/75 134/75 129/72  Pulse: (!) 104 (!) 102 (!) 102 96  Resp: 18 18 18 17   Temp: 98.6 F (37 C) 98.6 F (37 C) 98.6 F (37 C) 99.5 F (37.5 C)  TempSrc: Oral Oral Oral Oral  SpO2: 98% 92% 92% 94%  Weight:      Height:       Physical Exam General: Pleasant, NAD Heart: S3,M1 2/6 systolic M. No R/G. SR on monitor.  Lungs: CTAB Abdomen: Tender to palpation RLQ. Active BS Extremities: R groin hematoma. Area tender to palpation. No LE edema Dialysis Access: L AVG + bruit   Additional Objective Labs: Basic Metabolic Panel: Recent Labs  Lab 04/15/18 0948 04/20/18 0515 04/21/18 0455  NA 140 135  134* 136  K 4.7 6.1*  6.0* 3.8  CL 99 99  100 98  CO2 23 23  21* 27  GLUCOSE 122* 116*  117* 114*  BUN 43* 47*  48* 20  CREATININE 8.99* 9.13*  9.00* 5.47*  CALCIUM 9.6 8.3*  8.4* 8.2*  PHOS  --  5.6*  --    Liver Function Tests: Recent Labs  Lab 04/15/18 0948 04/20/18 0515  AST 14  --   ALT 9  --   ALKPHOS 118*  --   BILITOT 0.3  --   PROT 6.7  --   ALBUMIN 3.7* 2.8*   No results for input(s): LIPASE, AMYLASE in the last 168 hours. CBC: Recent Labs  Lab 04/15/18 0948  04/19/18 1856 04/19/18 2115 04/20/18 0515 04/21/18 0455  WBC 11.6*   < > 3.5* 17.1* 13.2* 9.4  NEUTROABS  --   --   --  14.7*  --   --   HGB 11.5   < > 8.6* 8.8* 7.8* 6.9*  HCT 35.5   < > 27.7* 28.0* 26.3* 22.4*  MCV 92  --  95.2 95.9 96.3 94.5  PLT 285   < > <5* 176 189 160   < > = values in this interval not displayed.   Blood Culture    Component Value Date/Time   SDES  12/01/2017 1747    URINE, RANDOM Performed at [facility], [place] [address] [place], [place] [address]    SPECREQUEST  12/01/2017 1747     NONE Performed at [facility], [place] [address], [place], [place] [address]    CULT (A) 12/01/2017 1747    >=100,000 COLONIES/mL GROUP B STREP(S.AGALACTIAE)ISOLATED TESTING AGAINST S. AGALACTIAE NOT ROUTINELY PERFORMED DUE TO PREDICTABILITY OF AMP/PEN/VAN SUSCEPTIBILITY. Performed at [facility], [place] [address] [place], [place] [address]    REPTSTATUS 12/03/2017 FINAL 12/01/2017 1747    Cardiac Enzymes: No results for input(s): CKTOTAL, CKMB, CKMBINDEX, TROPONINI in the last 168 hours. CBG: Recent Labs  Lab 04/19/18 2123 04/20/18 0648 04/20/18 2056 04/21/18 0721 04/21/18 1204  GLUCAP 205* 84 268* 88 206*   Iron Studies: No results for input(s): IRON, TIBC, TRANSFERRIN, FERRITIN in the last 72 hours. @lablastinr3 @ Studies/Results: Ct Abdomen Pelvis Wo Contrast  Result Date: 04/20/2018 CLINICAL DATA:  RIGHT groin hematoma and drop in hemoglobin after recent cardiac catheterization. Assess for retroperitoneal hemorrhage. History of end-stage renal disease on dialysis, lupus, diverticulitis and GI bleed. EXAM: CT ABDOMEN AND PELVIS  WITHOUT CONTRAST TECHNIQUE: Multidetector CT imaging of the abdomen and pelvis was performed following the standard protocol without IV contrast. COMPARISON:  CT abdomen and pelvis February 06, 2018 FINDINGS: LOWER CHEST: LEFT lung base pleural thickening and scarring. Heart is mildly enlarged. Trace pericardial effusion and thickening. Coronary artery stents. HEPATOBILIARY: Vicarious excretion of contrast in gallbladder. Contrast in the common bile duct. Negative non-contrast CT liver. PANCREAS: Normal. SPLEEN: Normal. ADRENALS/URINARY TRACT: Kidneys are orthotopic, demonstrating normal size; RIGHT renal scarring. Contrast in the collecting system decreases sensitivity for calcifications. Similar appearance of approximately 5 RIGHT and single LEFT renal cysts measuring to 2.8 cm. Limited assessment for renal masses by nonenhanced CT.  Urinary bladder is partially distended with contrast. Normal adrenal glands. STOMACH/BOWEL: The stomach, small and large bowel are normal in course and caliber scratch. Severe colonic diverticulosis, mild distal descending pericolonic fat stranding (axial image 49). Contrast in the colon and normal appendix. VASCULAR/LYMPHATIC: Aortoiliac vessels are normal in course and caliber. Mild calcific atherosclerosis. No lymphadenopathy by CT size criteria. REPRODUCTIVE: Mildly lobulated uterine contour seen with leiomyomas. 11 mm RIGHT Bartholin cyst. Homogeneously hypodense benign-appearing 4.2 cm RIGHT ovarian cyst. OTHER: No intraperitoneal free fluid or free air. MUSCULOSKELETAL: RIGHT flank and anterior pelvic wall subcutaneous fat stranding extending to RIGHT hip. Old bilateral rib fractures. Old RIGHT inferior pubic ramus fracture. Anterior abdominal wall subcentimeter calcifications. IMPRESSION: 1. No acute intra-abdominal/pelvic process; no retroperitoneal hematoma. 2. RIGHT flank and anterior pelvic wall subcutaneous fat stranding concerning for hemorrhage. 3. Colonic diverticulosis and minimal residual versus recurrent descending colonic diverticulitis. 4. **An incidental finding of potential clinical significance has been found. 4.2 cm benign-appearing RIGHT adnexal cyst; if patient is Peri menopausal, recommend ultrasound follow-up at 6-12 months; if premenopausal, no follow-up indicated. This recommendation follows ACR consensus guidelines: White Paper of the ACR Incidental Findings Committee II on Adnexal Findings. J Am Coll Radiol [phone]. ** Aortic Atherosclerosis (ICD10-I70.0). Electronically Signed   By: Elon Alas M.D.   On: 04/20/2018 19:48   Medications: . sodium chloride    . sodium chloride    . sodium chloride     . sodium chloride   Intravenous Once  . aspirin EC  81 mg Oral Daily  . atorvastatin  40 mg Oral Daily  . Chlorhexidine Gluconate Cloth  6 each Topical Q0600  .  clopidogrel  75 mg Oral Daily  . feeding supplement (PRO-STAT SUGAR FREE 64)  30 mL Oral BID  . insulin aspart  0-9 Units Subcutaneous TID WC  . metoprolol tartrate  12.5 mg Oral BID  . multivitamin  1 tablet Oral QHS  . pantoprazole  40 mg Oral Daily  . predniSONE  10 mg Oral Daily  . sodium chloride flush  3 mL Intravenous Q12H     Dialysis Orders: [place] T,Th,S 3 hrs 15 min 180 NRe 400/800 65 kg 3.0 K/2.25 Ca  L AVG -No heparin -Mircera 75 mcg IV q 2 weeks (last dose 04/16/18 Last HGB 11.2 04/18/18)/  -Venofer 50 mg IV q week (last dose 04/16/18 Last Tsat 22 03/21/18)/  Assessment/Plan: 1.  CAD: S/P PCI of LCX-DAPT for 1 year. No further CP since procedure.Per primary 2.  R groin hematoma. HGB 11.2 04/18/18 now down to 7.8. Symptomatic, feels dizzy. Will recheck CBC prior to HD.  CT abd obtained, no evidence of active bleed. HGB down to 6.9 this AM. Did not get PRBCs with HD last PM.  Give 2 units PRBCs today.   3.  Hyperkalemia: K+  6.0 04/20/18. Resolved with HD.  4.  ESRD - T,Th,S. Next HD 04/23/2018 5.  Hypertension/volume  -Has not been meeting EDW as OP. C/O feeling tired and weak post HD. Issues with hypotension last night toward end of tx. Tx shortened. Net UF only 364 post wt 66 kg. Raise EDW to 66.5 kg.  6.  Anemia  - As noted # 2. Recent OP ESA dose. Will transfuse 2 unit of PRBCS today.  7.  Metabolic bone disease -  Ca 8.3 Phos 5.6 Continue auryixa binders, VDRA.   8.  Nutrition - Albumin 2.8 renal/carb mod diet, add prostat, renal vit. 9.  DM-per primary  Jimmye Norman. Brown NP-C 04/21/2018, 1:11 PM  [facility] [phone]  Pt seen, examined and agree w A/P as above.  [facility] 04/21/2018, 1:33 PM

## 2018-04-21 NOTE — Progress Notes (Signed)
As I went to begin to enter my procedure notes for the HD tx for 2/22 I saw first thing a note from [place], [place] that stated he had discussed pt getting 1 unit PRBC on HD today w/ Nephrology. Went to look at Nephrology note and in the assessment/plan it stated will transfuse 1 unit PRBC on HD tonight. However, there were no transfusion orders entered as an order nor was it included in the HD tx orders. I called primary nurse, Kennieth Francois, RN to make him aware of what I had found and asked was he aware and thought I would be admin PRBC in HD and he said no. He rechecked behind me and also found no orders for any transfusion. Advised primary nurse to reach out to his primary team to discuss if they still wanted PRBC to be admin by him on the floor.

## 2018-04-21 NOTE — Progress Notes (Addendum)
Progress Note  Patient Name: Paula Bass Date of Encounter: 04/21/2018  Primary Cardiologist: Mertie Moores, MD   Subjective   No active angina symptoms or breathlessness.  Did not complete full hemodialysis session last evening due to nausea and blood pressure drop, stopped 15 minutes early.  Mild lower abdominal discomfort.  No nausea or emesis this morning.  Inpatient Medications    Scheduled Meds: . sodium chloride   Intravenous Once  . aspirin EC  81 mg Oral Daily  . atorvastatin  40 mg Oral Daily  . Chlorhexidine Gluconate Cloth  6 each Topical Q0600  . clopidogrel  75 mg Oral Daily  . feeding supplement (PRO-STAT SUGAR FREE 64)  30 mL Oral BID  . insulin aspart  0-9 Units Subcutaneous TID WC  . multivitamin  1 tablet Oral QHS  . pantoprazole  40 mg Oral Daily  . predniSONE  10 mg Oral Daily  . sodium chloride flush  3 mL Intravenous Q12H   Continuous Infusions: . sodium chloride    . sodium chloride    . sodium chloride     PRN Meds: sodium chloride, sodium chloride, sodium chloride, acetaminophen, alteplase, heparin, hyoscyamine, lidocaine (PF), lidocaine-prilocaine, morphine injection, ondansetron (ZOFRAN) IV, pentafluoroprop-tetrafluoroeth, promethazine **OR** promethazine **OR** promethazine, sodium chloride flush   Vital Signs    Vitals:   04/21/18 0030 04/21/18 0045 04/21/18 0107 04/21/18 0505  BP: 97/63 (!) 95/56 (!) 104/57 114/67  Pulse: (!) 110 100 (!) 102 (!) 104  Resp: (!) 28 (!) 26 (!) 22 18  Temp:   98.3 F (36.8 C) 99.6 F (37.6 C)  TempSrc:   Oral Oral  SpO2: 100% 100% 100% 100%  Weight:   66 kg 65.4 kg  Height:        Intake/Output Summary (Last 24 hours) at 04/21/2018 0759 Last data filed at 04/21/2018 0200 Gross per 24 hour  Intake 480 ml  Output 764 ml  Net -284 ml   Last 3 Weights 04/21/2018 04/21/2018 04/20/2018  Weight (lbs) 144 lb 2.9 oz 145 lb 8.1 oz 146 lb 6.2 oz  Weight (kg) 65.4 kg 66 kg 66.4 kg      Telemetry    NSR -  Personally Reviewed  Physical Exam   GEN: No acute distress.   Neck: No JVD Cardiac: RRR, no murmurs, rubs, or gallops.  Respiratory: Clear to auscultation bilaterally. GI: Soft, nontender, non-distended  MS: Rt groin ecchymotic, tender, firm linear area in inguinal ligament region. Neuro:  Nonfocal  Psych: Normal affect   Labs    Chemistry Recent Labs  Lab 04/15/18 0948 04/20/18 0515 04/21/18 0455  NA 140 135  134* 136  K 4.7 6.1*  6.0* 3.8  CL 99 99  100 98  CO2 23 23  21* 27  GLUCOSE 122* 116*  117* 114*  BUN 43* 47*  48* 20  CREATININE 8.99* 9.13*  9.00* 5.47*  CALCIUM 9.6 8.3*  8.4* 8.2*  PROT 6.7  --   --   ALBUMIN 3.7* 2.8*  --   AST 14  --   --   ALT 9  --   --   ALKPHOS 118*  --   --   BILITOT 0.3  --   --   GFRNONAA 5* 5*  5* 9*  GFRAA 5* 5*  5* 10*  ANIONGAP  --  13  13 11      Hematology Recent Labs  Lab 04/19/18 2115 04/20/18 0515 04/21/18 0455  WBC 17.1* 13.2* 9.4  RBC 2.92* 2.73* 2.37*  HGB 8.8* 7.8* 6.9*  HCT 28.0* 26.3* 22.4*  MCV 95.9 96.3 94.5  MCH 30.1 28.6 29.1  MCHC 31.4 29.7* 30.8  RDW 19.0* 19.3* 18.7*  PLT 176 189 160    Radiology    Ct Abdomen Pelvis Wo Contrast  Result Date: 04/20/2018 CLINICAL DATA:  RIGHT groin hematoma and drop in hemoglobin after recent cardiac catheterization. Assess for retroperitoneal hemorrhage. History of end-stage renal disease on dialysis, lupus, diverticulitis and GI bleed. EXAM: CT ABDOMEN AND PELVIS WITHOUT CONTRAST TECHNIQUE: Multidetector CT imaging of the abdomen and pelvis was performed following the standard protocol without IV contrast. COMPARISON:  CT abdomen and pelvis February 06, 2018 FINDINGS: LOWER CHEST: LEFT lung base pleural thickening and scarring. Heart is mildly enlarged. Trace pericardial effusion and thickening. Coronary artery stents. HEPATOBILIARY: Vicarious excretion of contrast in gallbladder. Contrast in the common bile duct. Negative non-contrast CT liver.  PANCREAS: Normal. SPLEEN: Normal. ADRENALS/URINARY TRACT: Kidneys are orthotopic, demonstrating normal size; RIGHT renal scarring. Contrast in the collecting system decreases sensitivity for calcifications. Similar appearance of approximately 5 RIGHT and single LEFT renal cysts measuring to 2.8 cm. Limited assessment for renal masses by nonenhanced CT. Urinary bladder is partially distended with contrast. Normal adrenal glands. STOMACH/BOWEL: The stomach, small and large bowel are normal in course and caliber scratch. Severe colonic diverticulosis, mild distal descending pericolonic fat stranding (axial image 49). Contrast in the colon and normal appendix. VASCULAR/LYMPHATIC: Aortoiliac vessels are normal in course and caliber. Mild calcific atherosclerosis. No lymphadenopathy by CT size criteria. REPRODUCTIVE: Mildly lobulated uterine contour seen with leiomyomas. 11 mm RIGHT Bartholin cyst. Homogeneously hypodense benign-appearing 4.2 cm RIGHT ovarian cyst. OTHER: No intraperitoneal free fluid or free air. MUSCULOSKELETAL: RIGHT flank and anterior pelvic wall subcutaneous fat stranding extending to RIGHT hip. Old bilateral rib fractures. Old RIGHT inferior pubic ramus fracture. Anterior abdominal wall subcentimeter calcifications. IMPRESSION: 1. No acute intra-abdominal/pelvic process; no retroperitoneal hematoma. 2. RIGHT flank and anterior pelvic wall subcutaneous fat stranding concerning for hemorrhage. 3. Colonic diverticulosis and minimal residual versus recurrent descending colonic diverticulitis. 4. **An incidental finding of potential clinical significance has been found. 4.2 cm benign-appearing RIGHT adnexal cyst; if patient is Peri menopausal, recommend ultrasound follow-up at 6-12 months; if premenopausal, no follow-up indicated. This recommendation follows ACR consensus guidelines: White Paper of the ACR Incidental Findings Committee II on Adnexal Findings. J Am Coll Radiol [phone]. ** Aortic  Atherosclerosis (ICD10-I70.0). Electronically Signed   By: Elon Alas M.D.   On: 04/20/2018 19:48    Cardiac Studies   Cardiac catheterization and PCI 04/19/2018: Conclusions: 1. Significant two-vessel CAD, with 80-90% mid/distal LCx and 90% mid RCA stenoses. 2. Moderate, non-obstructive LAD and proximal LCx/OM disease. 3. Patent stent in proximal RCA. 4. Normal left ventricular systolic function and filling pressure. 5. Successful PCI to mid/distal LCx using Synergy 2.25 x 20 mm DES with 0% residual stenosis and TIMI-3 flow.  Recommendations: 1. Overnight extended recovery; anticipate d/c home tomorrow AM with HD at outpatient center tomorrow PM. 2. Continue dual antiplatelet therapy with aspirin and clopidogrel for at least 12 months, ideally longer. 3. Medical therapy of LAD and RCA disease. If she has recurrent CP, PCI to mid RCA with orbital atherectomy will need to be considered.  Patient Profile     48 y.o. female with a history of ESRD on hemodialysis, type 2 diabetes mellitus, hypertension, lupus, and history of CAD with previous RCA stent in [place] back in 2017.  She  underwent cardiac catheterization yesterday in the setting of unstable angina and ultimately placement of DES to the mid to distal circumflex with otherwise plan to manage RCA disease medically.  Assessment & Plan    1.  CAD status post stent intervention to the RCA in [place] back in 2017, now status post DES to the mid to distal circumflex on 2/21 in the setting of unstable angina.  90% mid RCA stenosis was managed medically at that time.  She is on aspirin and Plavix, currently chest pain-free.  2.  Large right groin hematoma following cardiac catheterization. CT c/w hematoma, but did not show retroperitoneal bleed. Hgb down to 6.9 this morning - Nephrology has ordered 2 unit PRBC transfusion.  3.  ESRD on hemodialysis.  4.  Mixed hyperlipidemia, on Lipitor.  5.  Essential  hypertension.  6.  Transient low-grade temperature elevation likely secondary to hematoma.  Plan - Continue ASA Plavix and Lipitor. Add lopressor 12.5 mg BID.  PRBC transfusion as noted.  Continue observation.  For questions or updates, please contact [place] Please consult [email or web address] for contact info under     Signed, Kerin Ransom, PA-C  04/21/2018, 7:59 AM     Attending note:  Patient seen and examined.  I reviewed interval hospital course since evaluation yesterday morning and discussion with Mr. Rosalyn Gess PA-C yesterday afternoon.  Hemoglobin has dropped to 6.9 in the setting of right groin hematoma status post cardiac catheterization.  Abdominal and pelvic CT did not show retroperitoneal bleed however.  She is ordered to receive 2 units PRBCs by nephrology today.  Hemodialysis session was interrupted 15 minutes early last evening.  She is otherwise clinically stable this morning with mild lower abdominal and inguinal discomfort.  No nausea or vomiting.  No angina symptoms.  Recent systolic blood pressure [id number].  Heart rate 90s to 100 and sinus rhythm.  Clear without labored breathing.  Cardiac exam reveals RRR without gallop.  Right inguinal area shows ecchymosis consistent with hematoma with firm linear region around inguinal ligament, no obvious expansion anteriorly.  Large right inguinal hematoma as noted above status post cardiac catheterization.  Continue observation with repeat CBC in the morning, sooner if she has sudden clinical deterioration.  Abdominal and pelvic CT imaging did not show retroperitoneal bleed.  Do not plan to stop aspirin and Plavix at this time with recent DES intervention.  Satira Sark, M.D., F.A.C.C.

## 2018-04-22 ENCOUNTER — Encounter (HOSPITAL_COMMUNITY): Payer: Self-pay | Admitting: Internal Medicine

## 2018-04-22 DIAGNOSIS — N186 End stage renal disease: Secondary | ICD-10-CM | POA: Diagnosis not present

## 2018-04-22 DIAGNOSIS — I120 Hypertensive chronic kidney disease with stage 5 chronic kidney disease or end stage renal disease: Secondary | ICD-10-CM | POA: Diagnosis not present

## 2018-04-22 DIAGNOSIS — N2581 Secondary hyperparathyroidism of renal origin: Secondary | ICD-10-CM | POA: Diagnosis not present

## 2018-04-22 DIAGNOSIS — Z992 Dependence on renal dialysis: Secondary | ICD-10-CM | POA: Diagnosis not present

## 2018-04-22 DIAGNOSIS — E875 Hyperkalemia: Secondary | ICD-10-CM | POA: Diagnosis not present

## 2018-04-22 DIAGNOSIS — T148XXA Other injury of unspecified body region, initial encounter: Secondary | ICD-10-CM

## 2018-04-22 DIAGNOSIS — E1122 Type 2 diabetes mellitus with diabetic chronic kidney disease: Secondary | ICD-10-CM | POA: Diagnosis not present

## 2018-04-22 DIAGNOSIS — D631 Anemia in chronic kidney disease: Secondary | ICD-10-CM | POA: Diagnosis not present

## 2018-04-22 DIAGNOSIS — I25110 Atherosclerotic heart disease of native coronary artery with unstable angina pectoris: Secondary | ICD-10-CM | POA: Diagnosis not present

## 2018-04-22 DIAGNOSIS — I200 Unstable angina: Secondary | ICD-10-CM | POA: Diagnosis not present

## 2018-04-22 LAB — CBC
HEMATOCRIT: 30.5 % — AB (ref 36.0–46.0)
Hemoglobin: 9.6 g/dL — ABNORMAL LOW (ref 12.0–15.0)
MCH: 28.8 pg (ref 26.0–34.0)
MCHC: 31.5 g/dL (ref 30.0–36.0)
MCV: 91.6 fL (ref 80.0–100.0)
Platelets: 148 10*3/uL — ABNORMAL LOW (ref 150–400)
RBC: 3.33 MIL/uL — ABNORMAL LOW (ref 3.87–5.11)
RDW: 17.4 % — ABNORMAL HIGH (ref 11.5–15.5)
WBC: 7.9 10*3/uL (ref 4.0–10.5)
nRBC: 0 % (ref 0.0–0.2)

## 2018-04-22 LAB — BASIC METABOLIC PANEL
Anion gap: 10 (ref 5–15)
BUN: 45 mg/dL — AB (ref 6–20)
CO2: 25 mmol/L (ref 22–32)
Calcium: 8.9 mg/dL (ref 8.9–10.3)
Chloride: 100 mmol/L (ref 98–111)
Creatinine, Ser: 8.49 mg/dL — ABNORMAL HIGH (ref 0.44–1.00)
GFR calc Af Amer: 6 mL/min — ABNORMAL LOW (ref >60)
GFR calc non Af Amer: 5 mL/min — ABNORMAL LOW (ref >60)
GLUCOSE: 211 mg/dL — AB (ref 70–99)
Potassium: 4.4 mmol/L (ref 3.5–5.1)
Sodium: 135 mmol/L (ref 135–145)

## 2018-04-22 LAB — TYPE AND SCREEN
ABO/RH(D): O POS
Antibody Screen: NEGATIVE
Unit division: 0
Unit division: 0

## 2018-04-22 LAB — BPAM RBC
BLOOD PRODUCT EXPIRATION DATE: 202003182359
Blood Product Expiration Date: 202003212359
ISSUE DATE / TIME: 202002230822
ISSUE DATE / TIME: 202002231153
Unit Type and Rh: 5100
Unit Type and Rh: 5100

## 2018-04-22 LAB — POCT ACTIVATED CLOTTING TIME
Activated Clotting Time: 235 seconds
Activated Clotting Time: 274 seconds
Activated Clotting Time: 268 seconds

## 2018-04-22 LAB — GLUCOSE, CAPILLARY: Glucose-Capillary: 143 mg/dL — ABNORMAL HIGH (ref 70–99)

## 2018-04-22 MED ORDER — CLOPIDOGREL BISULFATE 75 MG PO TABS: 75.0000 mg | ORAL_TABLET | Freq: Every day | ORAL | Status: DC

## 2018-04-22 MED ORDER — ASPIRIN EC 81 MG PO TBEC: 81.0000 mg | DELAYED_RELEASE_TABLET | Freq: Every day | ORAL | Status: AC

## 2018-04-22 MED ORDER — CHLORHEXIDINE GLUCONATE CLOTH 2 % EX PADS: 6.0000 | MEDICATED_PAD | Freq: Every day | CUTANEOUS | Status: DC

## 2018-04-22 MED ORDER — CLOPIDOGREL BISULFATE 75 MG PO TABS: 75.0000 mg | ORAL_TABLET | Freq: Every day | ORAL | Status: AC

## 2018-04-22 MED ORDER — NITROGLYCERIN 0.4 MG SL SUBL: 0.4000 mg | SUBLINGUAL_TABLET | SUBLINGUAL | 2 refills | Status: DC | PRN

## 2018-04-22 MED ORDER — ASPIRIN EC 81 MG PO TBEC: 81.0000 mg | DELAYED_RELEASE_TABLET | Freq: Every day | ORAL | Status: DC

## 2018-04-22 MED ORDER — METOPROLOL TARTRATE 25 MG PO TABS: 12.5000 mg | ORAL_TABLET | Freq: Two times a day (BID) | ORAL | 1 refills | Status: DC

## 2018-04-22 NOTE — Progress Notes (Signed)
CARDIAC REHAB PHASE I   PRE:  Rate/Rhythm: 81 SR  BP:  Supine: 142/79  Sitting:   Standing   SaO2:100%RA   MODE:  Ambulation:240  ft   POST:  Rate/Rhythm: 94 SR  BP:  Supine:   Sitting: 139/81  Standing:    SaO2: 100%RA 0850-0945 Pt given time to finish her breakfast and then we walked. Pt walked 240 ft on RA with rolling walker and asst x 1. C/o feeling a little woozy. Also c/o legs weak. This was the farthest pt had walked. Only walked to bathroom. To recliner after walk. BP as documented. Would recommend walker for home use. Discussed with pt to have someone with her when up until she feels steadier. Asked NP for walker order. Pt stopped several times during walk to rest and I offered her to sit but she declined. No c/o CP.  Graylon Good, RN BSN  04/22/2018 9:39 AM

## 2018-04-22 NOTE — Discharge Summary (Addendum)
Discharge Summary    Patient ID: Paula Bass,  MRN: [id number], DOB/AGE: Feb 16, 1971 48 y.o.  Admit date: 04/19/2018 Discharge date: 04/22/2018  Primary Care Provider: Benito Mccreedy Primary Cardiologist: Dr. Acie Fredrickson  Discharge Diagnoses    Principal Problem:   Unstable angina ([place])   Allergies Allergies  Allergen Reactions  . Penicillins Hives    DID THE REACTION INVOLVE: Swelling of the face/tongue/throat, SOB, or low BP? Yes Sudden or severe rash/hives, skin peeling, or the inside of the mouth or nose? Yes Did it require medical treatment? No When did it last happen?Within the past 10 years If all above answers are "NO", may proceed with cephalosporin use.     Diagnostic Studies/Procedures    Cath: 04/19/2018  Conclusions: 1. Significant two-vessel CAD, with 80-90% mid/distal LCx and 90% mid RCA stenoses. 2. Moderate, non-obstructive LAD and proximal LCx/OM disease. 3. Patent stent in proximal RCA. 4. Normal left ventricular systolic function and filling pressure. 5. Successful PCI to mid/distal LCx using Synergy 2.25 x 20 mm DES with 0% residual stenosis and TIMI-3 flow.  Recommendations: 1. Overnight extended recovery; anticipate d/c home tomorrow AM with HD at outpatient center tomorrow PM. 2. Continue dual antiplatelet therapy with aspirin and clopidogrel for at least 12 months, ideally longer. 3. Medical therapy of LAD and RCA disease. If she has recurrent CP, PCI to mid RCA with orbital atherectomy will need to be considered.  [name], MD _____________   History of Present Illness     48 y.o. female with a hx of end-stage renal disease, diabetes mellitus, hypertension, lupus who was referred by Dr. Jeanella Anton for further evaluation of chest discomfort.  She had repeated episdoes of CP during dialysis . Seemed to be related to the volume and speed of the dialysis.   She had them slow down dialysis and stop dialysis and the chest pains  would typically resolve at that point. Mid sternum,  Associated with dyspnea.  No radiation   Does not get any regular exercise. Watches her diet pretty well  Moved from [place].  Has a hx of CAD with stenting  -she had at least one stent placed. She thought she might of had balloon angioplasty of another vessel.  She felt these episodes of CP were similar to when she was stented in the past. Given her symptoms she was set up for outpatient cardiac cath.   Hospital Course     Consultants: Nephrology   1. CAD s/p unknow stent to RCA (2017) and DES to mid Cx - No CP or SHOB  - Continue ASA and plavix, metoprolol. Metoprolol was decreased this admission. Amlodipine held in the setting of soft blood pressures. Consider adding back as an outpatient.   2. Large right groin hematoma following cardiac catheterization.  - CT c/w hematoma, but did not show retroperitoneal bleed.  - s/p 2 units pRBCs  - Hgb stable at 9.6 on DC day  3. Mixed hyperlipidemia - Continue Lipitor  4. Essential hypertension. - Discontinued amlodipine as blood pressures were soft, consider adding back as an outpatient.  - Continue metoprolol   [name] was seen by Dr. Irish Lack and determined stable for discharge home. Follow up in the office has been arranged. Medications are listed below.   _____________  Discharge Vitals Blood pressure (!) 147/80, pulse 79, temperature 98.5 F (36.9 C), temperature source Oral, resp. rate 18, height 5\' 1"  (1.549 m), weight 66.5 kg, last menstrual period 02/16/2018, SpO2 100 %.  Filed Weights   04/21/18 0107 04/21/18 0505 04/22/18 0612  Weight: 66 kg 65.4 kg 66.5 kg    Labs & Radiologic Studies    CBC Recent Labs    04/19/18 2115  04/21/18 0455 04/21/18 1941 04/22/18 0450  WBC 17.1*   < > 9.4  --  7.9  NEUTROABS 14.7*  --   --   --   --   HGB 8.8*   < > 6.9* 10.0* 9.6*  HCT 28.0*   < > 22.4* 30.9* 30.5*  MCV 95.9   < > 94.5  --  91.6  PLT 176   < >  160  --  148*   < > = values in this interval not displayed.   Basic Metabolic Panel Recent Labs    04/20/18 0515 04/21/18 0455 04/22/18 0450  NA 135  134* 136 135  K 6.1*  6.0* 3.8 4.4  CL 99  100 98 100  CO2 23  21* 27 25  GLUCOSE 116*  117* 114* 211*  BUN 47*  48* 20 45*  CREATININE 9.13*  9.00* 5.47* 8.49*  CALCIUM 8.3*  8.4* 8.2* 8.9  PHOS 5.6*  --   --    Liver Function Tests Recent Labs    04/20/18 0515  ALBUMIN 2.8*   No results for input(s): LIPASE, AMYLASE in the last 72 hours. Cardiac Enzymes No results for input(s): CKTOTAL, CKMB, CKMBINDEX, TROPONINI in the last 72 hours. BNP Invalid input(s): POCBNP D-Dimer No results for input(s): DDIMER in the last 72 hours. Hemoglobin A1C No results for input(s): HGBA1C in the last 72 hours. Fasting Lipid Panel No results for input(s): CHOL, HDL, LDLCALC, TRIG, CHOLHDL, LDLDIRECT in the last 72 hours. Thyroid Function Tests No results for input(s): TSH, T4TOTAL, T3FREE, THYROIDAB in the last 72 hours.  Invalid input(s): FREET3 _____________  Ct Abdomen Pelvis Wo Contrast  Result Date: 04/20/2018 CLINICAL DATA:  RIGHT groin hematoma and drop in hemoglobin after recent cardiac catheterization. Assess for retroperitoneal hemorrhage. History of end-stage renal disease on dialysis, lupus, diverticulitis and GI bleed. EXAM: CT ABDOMEN AND PELVIS WITHOUT CONTRAST TECHNIQUE: Multidetector CT imaging of the abdomen and pelvis was performed following the standard protocol without IV contrast. COMPARISON:  CT abdomen and pelvis February 06, 2018 FINDINGS: LOWER CHEST: LEFT lung base pleural thickening and scarring. Heart is mildly enlarged. Trace pericardial effusion and thickening. Coronary artery stents. HEPATOBILIARY: Vicarious excretion of contrast in gallbladder. Contrast in the common bile duct. Negative non-contrast CT liver. PANCREAS: Normal. SPLEEN: Normal. ADRENALS/URINARY TRACT: Kidneys are orthotopic, demonstrating  normal size; RIGHT renal scarring. Contrast in the collecting system decreases sensitivity for calcifications. Similar appearance of approximately 5 RIGHT and single LEFT renal cysts measuring to 2.8 cm. Limited assessment for renal masses by nonenhanced CT. Urinary bladder is partially distended with contrast. Normal adrenal glands. STOMACH/BOWEL: The stomach, small and large bowel are normal in course and caliber scratch. Severe colonic diverticulosis, mild distal descending pericolonic fat stranding (axial image 49). Contrast in the colon and normal appendix. VASCULAR/LYMPHATIC: Aortoiliac vessels are normal in course and caliber. Mild calcific atherosclerosis. No lymphadenopathy by CT size criteria. REPRODUCTIVE: Mildly lobulated uterine contour seen with leiomyomas. 11 mm RIGHT Bartholin cyst. Homogeneously hypodense benign-appearing 4.2 cm RIGHT ovarian cyst. OTHER: No intraperitoneal free fluid or free air. MUSCULOSKELETAL: RIGHT flank and anterior pelvic wall subcutaneous fat stranding extending to RIGHT hip. Old bilateral rib fractures. Old RIGHT inferior pubic ramus fracture. Anterior abdominal wall subcentimeter calcifications. IMPRESSION: 1. No acute  intra-abdominal/pelvic process; no retroperitoneal hematoma. 2. RIGHT flank and anterior pelvic wall subcutaneous fat stranding concerning for hemorrhage. 3. Colonic diverticulosis and minimal residual versus recurrent descending colonic diverticulitis. 4. **An incidental finding of potential clinical significance has been found. 4.2 cm benign-appearing RIGHT adnexal cyst; if patient is Peri menopausal, recommend ultrasound follow-up at 6-12 months; if premenopausal, no follow-up indicated. This recommendation follows ACR consensus guidelines: White Paper of the ACR Incidental Findings Committee II on Adnexal Findings. J Am Coll Radiol [phone]. ** Aortic Atherosclerosis (ICD10-I70.0). Electronically Signed   By: Elon Alas M.D.   On:  04/20/2018 19:48   Disposition   Pt is being discharged home today in good condition.  Follow-up Plans & Appointments    Follow-up Information    Liliane Shi, PA-C Follow up on 05/08/2018.   Specialties:  Cardiology, Physician Assistant Why:  at 8:45am for your follow up appt.  Contact information: [address] [place] [address] [phone]        Benito Mccreedy, MD. Daphane Shepherd on 05/02/2018.   Specialty:  Internal Medicine Why:  @3 :15pm Contact information: [address] [place] [address] [phone]          Discharge Instructions    Amb Referral to Cardiac Rehabilitation   Complete by:  As directed    Diagnosis:  Coronary Stents   Diet - low sodium heart healthy   Complete by:  As directed    Discharge instructions   Complete by:  As directed    Groin Site Care Refer to this sheet in the next few weeks. These instructions provide you with information on caring for yourself after your procedure. Your caregiver may also give you more specific instructions. Your treatment has been planned according to current medical practices, but problems sometimes occur. Call your caregiver if you have any problems or questions after your procedure. HOME CARE INSTRUCTIONS You may shower 24 hours after the procedure. Remove the bandage (dressing) and gently wash the site with plain soap and water. Gently pat the site dry.  Do not apply powder or lotion to the site.  Do not sit in a bathtub, swimming pool, or whirlpool for 5 to 7 days.  No bending, squatting, or lifting anything over 10 pounds (4.5 kg) as directed by your caregiver.  Inspect the site at least twice daily.  Do not drive home if you are discharged the same day of the procedure. Have someone else drive you.  You may drive 24 hours after the procedure unless otherwise instructed by your caregiver.  What to expect: Any bruising will usually fade within 1 to 2 weeks.  Blood that  collects in the tissue (hematoma) may be painful to the touch. It should usually decrease in size and tenderness within 1 to 2 weeks.  SEEK IMMEDIATE MEDICAL CARE IF: You have unusual pain at the groin site or down the affected leg.  You have redness, warmth, swelling, or pain at the groin site.  You have drainage (other than a small amount of blood on the dressing).  You have chills.  You have a fever or persistent symptoms for more than 72 hours.  You have a fever and your symptoms suddenly get worse.  Your leg becomes pale, cool, tingly, or numb.  You have heavy bleeding from the site. Hold pressure on the site. Marland Kitchen  PLEASE DO NOT MISS ANY DOSES OF YOUR PLAVIX!!!!! Also keep a log of you blood pressures and bring  back to your follow up appt. Please call the office with any questions.   Patients taking blood thinners should generally stay away from medicines like ibuprofen, Advil, Motrin, naproxen, and Aleve due to risk of stomach bleeding. You may take Tylenol as directed or talk to your primary doctor about alternatives.   For home use only DME 4 wheeled rolling walker with seat   Complete by:  As directed    Patient needs a walker to treat with the following condition:  Weakness   Increase activity slowly   Complete by:  As directed       Discharge Medications     Medication List    STOP taking these medications   amLODipine 10 MG tablet Commonly known as:  NORVASC     TAKE these medications   aspirin EC 81 MG tablet Take 1 tablet (81 mg total) by mouth daily.   atorvastatin 40 MG tablet Commonly known as:  LIPITOR Take 40 mg by mouth daily.   clopidogrel 75 MG tablet Commonly known as:  PLAVIX Take 1 tablet (75 mg total) by mouth daily.   HUMALOG 100 UNIT/ML injection Generic drug:  insulin lispro Inject 30-40 Units into the skin See admin instructions. Inject 40 units into the skin before breakfast and 30 units at bedtime   hyoscyamine 0.125 MG SL  tablet Commonly known as:  LEVSIN SL Place 1 tablet (0.125 mg total) under the tongue every 6 (six) hours as needed. What changed:  reasons to take this   lidocaine-prilocaine cream Commonly known as:  EMLA Apply 1 application topically Every Tuesday,Thursday,and Saturday with dialysis.   LUBRICANT EYE DROPS 0.4-0.3 % Soln Generic drug:  Polyethyl Glycol-Propyl Glycol Place 1 drop into both eyes 3 (three) times daily as needed (dry/irritated eyes.).   metoprolol tartrate 25 MG tablet Commonly known as:  LOPRESSOR Take 0.5 tablets (12.5 mg total) by mouth 2 (two) times daily. What changed:  how much to take   multivitamin Tabs tablet Take 1 tablet by mouth daily.   nitroGLYCERIN 0.4 MG SL tablet Commonly known as:  NITROSTAT Place 1 tablet (0.4 mg total) under the tongue every 5 (five) minutes as needed.   nystatin powder Commonly known as:  MYCOSTATIN/NYSTOP Apply topically 4 (four) times daily. What changed:    how much to take  when to take this  reasons to take this   pantoprazole 40 MG tablet Commonly known as:  PROTONIX Take 1 tablet (40 mg total) by mouth daily.   predniSONE 10 MG tablet Commonly known as:  DELTASONE Take 10 mg by mouth daily.       Acute coronary syndrome (MI, NSTEMI, STEMI, etc) this admission?: No.     Outstanding Labs/Studies   CBC at HD.   Duration of Discharge Encounter   Greater than 30 minutes including physician time.  Signed, Reino Bellis NP-C 04/22/2018, 12:17 PM  I have examined the patient and reviewed assessment and plan and discussed with patient.  Agree with above as stated.   2+ right PT pulse. Right groin is soft.  Hbg stable.  RIght groin stable. CONtinue DAPT.  Hbg to be checked at dialysis.  OK for discharge.  Larae Grooms

## 2018-04-22 NOTE — Progress Notes (Addendum)
Progress Note  Patient Name: Paula Bass Date of Encounter: 04/22/2018  Primary Cardiologist: Mertie Moores, MD   Subjective   Patient feeling well this AM. She is not having any SHOB or CP. Discussed her medications. She is currently on metoprolol and amlodipine for BP management. Takes them after HD on HD days. Discussed possible discharge today.   Inpatient Medications    Scheduled Meds: . sodium chloride   Intravenous Once  . aspirin EC  81 mg Oral Daily  . atorvastatin  40 mg Oral Daily  . Chlorhexidine Gluconate Cloth  6 each Topical Q0600  . Chlorhexidine Gluconate Cloth  6 each Topical Q0600  . clopidogrel  75 mg Oral Daily  . feeding supplement (PRO-STAT SUGAR FREE 64)  30 mL Oral BID  . insulin aspart  0-9 Units Subcutaneous TID WC  . metoprolol tartrate  12.5 mg Oral BID  . multivitamin  1 tablet Oral QHS  . pantoprazole  40 mg Oral Daily  . predniSONE  10 mg Oral Daily  . sodium chloride flush  3 mL Intravenous Q12H   Continuous Infusions: . sodium chloride    . sodium chloride    . sodium chloride     PRN Meds: sodium chloride, sodium chloride, sodium chloride, acetaminophen, alteplase, heparin, hyoscyamine, lidocaine (PF), lidocaine-prilocaine, morphine injection, ondansetron (ZOFRAN) IV, pentafluoroprop-tetrafluoroeth, promethazine **OR** promethazine **OR** promethazine, sodium chloride flush   Vital Signs    Vitals:   04/21/18 1724 04/21/18 1925 04/22/18 0612 04/22/18 0825  BP: (!) 142/78 (!) 163/77 (!) 148/80 (!) 147/80  Pulse:  98 81 79  Resp:  16 18   Temp: 98.4 F (36.9 C) 98.7 F (37.1 C) 98.5 F (36.9 C)   TempSrc: Oral Oral Oral   SpO2:  99% 96% 100%  Weight:   66.5 kg   Height:        Intake/Output Summary (Last 24 hours) at 04/22/2018 0909 Last data filed at 04/21/2018 1659 Gross per 24 hour  Intake 875 ml  Output 700 ml  Net 175 ml   Last 3 Weights 04/22/2018 04/21/2018 04/21/2018  Weight (lbs) 146 lb 9.6 oz 144 lb 2.9 oz 145 lb  8.1 oz  Weight (kg) 66.497 kg 65.4 kg 66 kg     Telemetry    NSR - Personally Reviewed  Physical Exam   GEN: No acute distress.   Neck: No JVD Cardiac: RRR, no murmurs, rubs, or gallops.  Respiratory: Clear to auscultation bilaterally. GI: Soft, nontender, non-distended  MS: Rt groin ecchymotic, tender, firm linear area in inguinal ligament region. Neuro:  Nonfocal  Psych: Normal affect   Labs    Chemistry Recent Labs  Lab 04/15/18 0948 04/20/18 0515 04/21/18 0455 04/22/18 0450  NA 140 135  134* 136 135  K 4.7 6.1*  6.0* 3.8 4.4  CL 99 99  100 98 100  CO2 23 23  21* 27 25  GLUCOSE 122* 116*  117* 114* 211*  BUN 43* 47*  48* 20 45*  CREATININE 8.99* 9.13*  9.00* 5.47* 8.49*  CALCIUM 9.6 8.3*  8.4* 8.2* 8.9  PROT 6.7  --   --   --   ALBUMIN 3.7* 2.8*  --   --   AST 14  --   --   --   ALT 9  --   --   --   ALKPHOS 118*  --   --   --   BILITOT 0.3  --   --   --  GFRNONAA 5* 5*  5* 9* 5*  GFRAA 5* 5*  5* 10* 6*  ANIONGAP  --  13  13 11 10     Hematology Recent Labs  Lab 04/20/18 0515 04/21/18 0455 04/21/18 1941 04/22/18 0450  WBC 13.2* 9.4  --  7.9  RBC 2.73* 2.37*  --  3.33*  HGB 7.8* 6.9* 10.0* 9.6*  HCT 26.3* 22.4* 30.9* 30.5*  MCV 96.3 94.5  --  91.6  MCH 28.6 29.1  --  28.8  MCHC 29.7* 30.8  --  31.5  RDW 19.3* 18.7*  --  17.4*  PLT 189 160  --  148*   Radiology    Ct Abdomen Pelvis Wo Contrast  Result Date: 04/20/2018 CLINICAL DATA:  RIGHT groin hematoma and drop in hemoglobin after recent cardiac catheterization. Assess for retroperitoneal hemorrhage. History of end-stage renal disease on dialysis, lupus, diverticulitis and GI bleed. EXAM: CT ABDOMEN AND PELVIS WITHOUT CONTRAST TECHNIQUE: Multidetector CT imaging of the abdomen and pelvis was performed following the standard protocol without IV contrast. COMPARISON:  CT abdomen and pelvis February 06, 2018 FINDINGS: LOWER CHEST: LEFT lung base pleural thickening and scarring. Heart is  mildly enlarged. Trace pericardial effusion and thickening. Coronary artery stents. HEPATOBILIARY: Vicarious excretion of contrast in gallbladder. Contrast in the common bile duct. Negative non-contrast CT liver. PANCREAS: Normal. SPLEEN: Normal. ADRENALS/URINARY TRACT: Kidneys are orthotopic, demonstrating normal size; RIGHT renal scarring. Contrast in the collecting system decreases sensitivity for calcifications. Similar appearance of approximately 5 RIGHT and single LEFT renal cysts measuring to 2.8 cm. Limited assessment for renal masses by nonenhanced CT. Urinary bladder is partially distended with contrast. Normal adrenal glands. STOMACH/BOWEL: The stomach, small and large bowel are normal in course and caliber scratch. Severe colonic diverticulosis, mild distal descending pericolonic fat stranding (axial image 49). Contrast in the colon and normal appendix. VASCULAR/LYMPHATIC: Aortoiliac vessels are normal in course and caliber. Mild calcific atherosclerosis. No lymphadenopathy by CT size criteria. REPRODUCTIVE: Mildly lobulated uterine contour seen with leiomyomas. 11 mm RIGHT Bartholin cyst. Homogeneously hypodense benign-appearing 4.2 cm RIGHT ovarian cyst. OTHER: No intraperitoneal free fluid or free air. MUSCULOSKELETAL: RIGHT flank and anterior pelvic wall subcutaneous fat stranding extending to RIGHT hip. Old bilateral rib fractures. Old RIGHT inferior pubic ramus fracture. Anterior abdominal wall subcentimeter calcifications. IMPRESSION: 1. No acute intra-abdominal/pelvic process; no retroperitoneal hematoma. 2. RIGHT flank and anterior pelvic wall subcutaneous fat stranding concerning for hemorrhage. 3. Colonic diverticulosis and minimal residual versus recurrent descending colonic diverticulitis. 4. **An incidental finding of potential clinical significance has been found. 4.2 cm benign-appearing RIGHT adnexal cyst; if patient is Peri menopausal, recommend ultrasound follow-up at 6-12 months; if  premenopausal, no follow-up indicated. This recommendation follows ACR consensus guidelines: White Paper of the ACR Incidental Findings Committee II on Adnexal Findings. J Am Coll Radiol [phone]. ** Aortic Atherosclerosis (ICD10-I70.0). Electronically Signed   By: Elon Alas M.D.   On: 04/20/2018 19:48   Cardiac Studies   Cardiac catheterization and PCI 04/19/2018: Conclusions: 1. Significant two-vessel CAD, with 80-90% mid/distal LCx and 90% mid RCA stenoses. 2. Moderate, non-obstructive LAD and proximal LCx/OM disease. 3. Patent stent in proximal RCA. 4. Normal left ventricular systolic function and filling pressure. 5. Successful PCI to mid/distal LCx using Synergy 2.25 x 20 mm DES with 0% residual stenosis and TIMI-3 flow.  Recommendations: 1. Overnight extended recovery; anticipate d/c home tomorrow AM with HD at outpatient center tomorrow PM. 2. Continue dual antiplatelet therapy with aspirin and clopidogrel for at least  12 months, ideally longer. 3. Medical therapy of LAD and RCA disease. If she has recurrent CP, PCI to mid RCA with orbital atherectomy will need to be considered.  Patient Profile     48 y.o. female with a history of ESRD on hemodialysis, type 2 diabetes mellitus, hypertension, lupus, and history of CAD with previous RCA stent in [place] back in 2017.  She underwent cardiac catheterization yesterday in the setting of unstable angina and ultimately placement of DES to the mid to distal circumflex with otherwise plan to manage RCA disease medically.  Assessment & Plan    CAD s/p unknow stent to RCA (2017) and DES to mid Cx - No CP or SHOB  - Continue ASA and plavix  - Continue metoprolol  - Consider discontinuing amlodipine and starting ACE/ARB  Large right groin hematoma following cardiac catheterization.  - CT c/w hematoma, but did not show retroperitoneal bleed.  - s/p 2 units pRBCs  - Hgb stable at 9.6   Mixed hyperlipidemia - Continue  Lipitor.  Essential hypertension. - Discontinue amlodipine to start ACE/ARB  - Continue metoprolol   Will discuss the case further with Dr. Irish Lack.   For questions or updates, please contact [place] Please consult [email or web address] for contact info under     Signed, Ina Homes, MD  04/22/2018, 9:09 AM     I have examined the patient and reviewed assessment and plan and discussed with patient.  Agree with above as stated.     Hbg stable.  RIght groin stable. CONtinue DAPT.  Hbg to be checked at dialysis.  OK for discharge.  Larae Grooms

## 2018-04-22 NOTE — Progress Notes (Signed)
Subjective: Interval History: has no complaint, leg better, up walking.  Objective: Vital signs in last 24 hours: Temp:  [98.4 F (36.9 C)-99.5 F (37.5 C)] 98.5 F (36.9 C) (02/24 0612) Pulse Rate:  [81-105] 81 (02/24 0612) Resp:  [16-19] 18 (02/24 0612) BP: (124-163)/(72-86) 148/80 (02/24 0612) SpO2:  [92 %-99 %] 96 % (02/24 0612) Weight:  [66.5 kg] 66.5 kg (02/24 0612) Weight change: 0.59 kg  Intake/Output from previous day: 02/23 0701 - 02/24 0700 In: 1310 [P.O.:240; I.V.:50; Blood:1020] Out: 700 [Urine:700] Intake/Output this shift: No intake/output data recorded.  General appearance: alert, cooperative and no distress Resp: clear to auscultation bilaterally Cardio: S1, S2 normal and systolic murmur: systolic ejection 2/6, crescendo and decrescendo at 2nd left intercostal space GI: obese, pos bs, soft,  Extremities: extremities normal, atraumatic, no cyanosis or edema  Lab Results: Recent Labs    04/21/18 0455 04/21/18 1941 04/22/18 0450  WBC 9.4  --  7.9  HGB 6.9* 10.0* 9.6*  HCT 22.4* 30.9* 30.5*  PLT 160  --  148*   BMET:  Recent Labs    04/21/18 0455 04/22/18 0450  NA 136 135  K 3.8 4.4  CL 98 100  CO2 27 25  GLUCOSE 114* 211*  BUN 20 45*  CREATININE 5.47* 8.49*  CALCIUM 8.2* 8.9   No results for input(s): PTH in the last 72 hours. Iron Studies: No results for input(s): IRON, TIBC, TRANSFERRIN, FERRITIN in the last 72 hours.  Studies/Results: Ct Abdomen Pelvis Wo Contrast  Result Date: 04/20/2018 CLINICAL DATA:  RIGHT groin hematoma and drop in hemoglobin after recent cardiac catheterization. Assess for retroperitoneal hemorrhage. History of end-stage renal disease on dialysis, lupus, diverticulitis and GI bleed. EXAM: CT ABDOMEN AND PELVIS WITHOUT CONTRAST TECHNIQUE: Multidetector CT imaging of the abdomen and pelvis was performed following the standard protocol without IV contrast. COMPARISON:  CT abdomen and pelvis February 06, 2018 FINDINGS:  LOWER CHEST: LEFT lung base pleural thickening and scarring. Heart is mildly enlarged. Trace pericardial effusion and thickening. Coronary artery stents. HEPATOBILIARY: Vicarious excretion of contrast in gallbladder. Contrast in the common bile duct. Negative non-contrast CT liver. PANCREAS: Normal. SPLEEN: Normal. ADRENALS/URINARY TRACT: Kidneys are orthotopic, demonstrating normal size; RIGHT renal scarring. Contrast in the collecting system decreases sensitivity for calcifications. Similar appearance of approximately 5 RIGHT and single LEFT renal cysts measuring to 2.8 cm. Limited assessment for renal masses by nonenhanced CT. Urinary bladder is partially distended with contrast. Normal adrenal glands. STOMACH/BOWEL: The stomach, small and large bowel are normal in course and caliber scratch. Severe colonic diverticulosis, mild distal descending pericolonic fat stranding (axial image 49). Contrast in the colon and normal appendix. VASCULAR/LYMPHATIC: Aortoiliac vessels are normal in course and caliber. Mild calcific atherosclerosis. No lymphadenopathy by CT size criteria. REPRODUCTIVE: Mildly lobulated uterine contour seen with leiomyomas. 11 mm RIGHT Bartholin cyst. Homogeneously hypodense benign-appearing 4.2 cm RIGHT ovarian cyst. OTHER: No intraperitoneal free fluid or free air. MUSCULOSKELETAL: RIGHT flank and anterior pelvic wall subcutaneous fat stranding extending to RIGHT hip. Old bilateral rib fractures. Old RIGHT inferior pubic ramus fracture. Anterior abdominal wall subcentimeter calcifications. IMPRESSION: 1. No acute intra-abdominal/pelvic process; no retroperitoneal hematoma. 2. RIGHT flank and anterior pelvic wall subcutaneous fat stranding concerning for hemorrhage. 3. Colonic diverticulosis and minimal residual versus recurrent descending colonic diverticulitis. 4. **An incidental finding of potential clinical significance has been found. 4.2 cm benign-appearing RIGHT adnexal cyst; if patient is  Peri menopausal, recommend ultrasound follow-up at 6-12 months; if premenopausal, no follow-up indicated.  This recommendation follows ACR consensus guidelines: White Paper of the ACR Incidental Findings Committee II on Adnexal Findings. J Am Coll Radiol [id number]. ** Aortic Atherosclerosis (ICD10-I70.0). Electronically Signed   By: Elon Alas M.D.   On: 04/20/2018 19:48    I have reviewed the patient's current medications.  Assessment/Plan: 1 ESRD HD tts.  Some xs vol 2 Anemia esa, TX 2 U 3 DM controlled 4 Obesity 5 CAD per cards 6 HPTH P HD TTS, esa, mobilize     LOS: 0 days   Jeneen Rinks Vici Novick 04/22/2018,7:46 AM

## 2018-04-22 NOTE — Progress Notes (Signed)
Rolling walker ordered for home as requested. Mindi Slicker [facility] [phone]

## 2018-04-23 ENCOUNTER — Telehealth (HOSPITAL_COMMUNITY): Payer: Self-pay

## 2018-04-23 NOTE — Telephone Encounter (Signed)
Called patient to see if she is interested in the Cardiac Rehab Program. Patient stated she is not able to attend due to transportation issues.    Close referral.

## 2018-04-23 NOTE — Telephone Encounter (Signed)
Referral recv'd and verified for MD signature. Follow up appointment is on 05/08/2018. Insurance benefits and eligibility TBD. Tedra Senegal. Support Rep II

## 2018-04-24 ENCOUNTER — Telehealth: Payer: Self-pay | Admitting: *Deleted

## 2018-04-24 NOTE — Telephone Encounter (Signed)
    [facility] Pre-operative Risk Assessment    Request for surgical clearance:  1. What type of surgery is being performed? KIDNEY TRANSPLANT   2. When is this surgery scheduled? TBD   3. What type of clearance is required (medical clearance vs. Pharmacy clearance to hold med vs. Both)? MEDICAL  4. Are there any medications that need to be held prior to surgery and how long?PLAVIX AND ASA   5. Practice name and name of physician performing surgery? [facility]; DR. SHAMKANT MULGAONKAR   6. What is your office phone number [phone]    7.   What is your office fax number [phone]  8.   Anesthesia type (None, local, MAC, general) ? GENERAL ?    Julaine Hua 04/24/2018, 4:32 PM  _________________________________________________________________   (provider comments below)

## 2018-04-24 NOTE — Telephone Encounter (Signed)
Received fax from [place] with [facility]. I called and s/w Mickel Baas and advised received the fax requesting the records and cardiac clearance. I did ask for her to please send me a separate fax in regards to information for the surgery, (date, meds to hold if any, anesthesia etc). Mickel Baas said they don't have an actual surgery form though she will have doctor write this up and she will fax to [phone].

## 2018-04-24 NOTE — Telephone Encounter (Signed)
I touched base with Loyal Buba at [facility] to check and see if doctor faxed over surgery recommendations and information. Mickel Baas states not yet at this time. She did say she will fax over the H&P and see if this will have any of the information for the surgery that may help [place]. I did ask for doctor fax over still requirements needed for surgery.

## 2018-04-25 NOTE — Telephone Encounter (Signed)
   Primary Cardiologist: Mertie Moores, MD  Chart reviewed as part of pre-operative protocol coverage. Surgical clearance request sent for renal transplant, however pt is s/p recent coronary stent placement on 04/19/18 and will require uninterrupted DAPT w/ ASA and Plavix. Ideally, we prefer uninterrupted DAPT for 12 months post stent placement, however in some cases, DAPT can be temporarily held after 3-6 months, if surgery is needed.   For now, pt is not cleared to undergo renal transplant. She will have to wait until she has completed the minimum duration of DAPT, to be decided by her primary cardiologist Dr. Acie Fredrickson. I will route to Dr. Acie Fredrickson for review. He will determine time frame in which we can revisit this.   Pre-op covering staff: - Please contact requesting surgeon's office via preferred method (i.e, phone, fax) to inform them that patient is not cleared by cardiology to have surgery at this time, for the reasons outlined above.    Lyda Jester, PA-C 04/25/2018, 8:38 AM

## 2018-04-29 NOTE — Telephone Encounter (Signed)
I would prefer that we wait at least 6 months before holding her ASA and Brilinta for renal transplant.

## 2018-04-29 NOTE — Telephone Encounter (Signed)
   Primary Cardiologist:Philip Nahser, MD  Chart reviewed as part of pre-operative protocol coverage. Pre-op clearance already addressed by colleagues in earlier phone notes. To summarize recommendations:  - Surgical clearance requested for renal transplant, however pt is s/p recent coronary stent placement on 04/19/18 and will require uninterrupted DAPT w/ ASA and Plavix. Ideally, we prefer uninterrupted DAPT for 12 months post stent placement, however in some cases, DAPT can be temporarily held earlier. Per Dr. Acie Fredrickson, would prefer to wait at least 6 months from stent placement before holding her aspirin and Brilinta for surgery. This would be after 10/18/18.  If surgery is planned for that time please request clearance again closer to the surgery.   Will route this bundled recommendation to requesting provider via Epic fax function. Please call with questions.  Daune Perch, NP 04/29/2018, 3:33 PM

## 2018-05-07 ENCOUNTER — Encounter: Payer: Self-pay | Admitting: Physician Assistant

## 2018-05-07 DIAGNOSIS — I10 Essential (primary) hypertension: Secondary | ICD-10-CM | POA: Insufficient documentation

## 2018-05-07 DIAGNOSIS — E785 Hyperlipidemia, unspecified: Secondary | ICD-10-CM | POA: Insufficient documentation

## 2018-05-07 NOTE — Progress Notes (Addendum)
Cardiology Office Note:    Date:  05/08/2018   ID:  Paula Bass, DOB 07-30-1970, MRN [id number]  PCP:  Benito Mccreedy, MD  Cardiologist:  Mertie Moores, MD   Electrophysiologist:  None   Referring MD: Benito Mccreedy, MD   Chief Complaint  Patient presents with  . Hospitalization Follow-up    Status post PCI     History of Present Illness:    Paula Bass is a 48 y.o. female with CAD s/p prior PCI ([place] in 2017), ESRD, diabetes, hypertension, hyperlipidemia, systemic lupus erythematosus.  She established with Dr. Acie Fredrickson 04/15/18 for the evaluation of chest pain.  Cardiac Catheterization was recommended.  This demonstrated 2 v CAD with 80-90% mid-distal LCx/OM disease and 90% mid RCA disease.  The distal LCx was tx with a DES.  Med Rx was recommended for the RCA disease unless she has refractory angina.  If so, orbital atherectomy could be considered.  Her procedure was c/b a large R groin hematoma.  Her hemoglobin dropped and she required PRBCs x 2.  CT was neg for retroperitoneal hematoma.     Paula Bass returns for post hospital follow up.  She is here with her husband.  Since discharge from the hospital, she notes more frequent left-sided chest discomfort.  This may last a minute or 2.  It does not seem to be consistently related to exertion.  She also feels discomfort associated with belching.  She does have some shortness of breath.  She sleeps on 2 pillows.  She does experience PND at times.  She denies lower extremity swelling.  She denies syncope.  She has been able to get to her dry weight at dialysis.  She goes to dialysis every Tuesday, Thursday, Saturday.  Her blood pressure medications were held when she had her groin bleed in the hospital.  She was not placed back on amlodipine.  However, her blood pressure was significantly elevated when she saw primary care and her amlodipine was resumed.   Prior CV studies:   The following studies were reviewed today:  Cardiac  Catheterization 04/19/18 LM normal LCx dist 85, OM1 40, OM2 50 RCA prox stent patent, mid 90 EF 55-65 PCI:  2.25 x 20 mm Synergy DES to dist LCx       Past Medical History:  Diagnosis Date  . CAD (coronary artery disease) 2016  . CAD (coronary artery disease) 04/19/2018   Hx of PCI to [facility] in [place] in 2017 // [place] 03/2018: dLCx 32, OM1 40, OM2 50, pRCA stent ok, mid RCA 90, EF 55-65, PCI: DES to dLCx  . Diabetes mellitus without complication ([place])   . Dialysis patient ([place])   . Diverticulitis   . GI bleeding   . Hypertension   . Lupus ([place])   . Renal disorder    dialysis   Surgical Hx: The patient  has a past surgical history that includes Cardiac surgery; Kidney graft (Left); LEFT HEART CATH AND CORONARY ANGIOGRAPHY (N/A, 04/19/2018); and CORONARY STENT INTERVENTION (N/A, 04/19/2018).   Current Medications: Current Meds  Medication Sig  . amLODipine (NORVASC) 10 MG tablet Take 10 mg by mouth daily.  Marland Kitchen aspirin EC 81 MG tablet Take 1 tablet (81 mg total) by mouth daily.  Marland Kitchen atorvastatin (LIPITOR) 40 MG tablet Take 40 mg by mouth daily.   . clopidogrel (PLAVIX) 75 MG tablet Take 1 tablet (75 mg total) by mouth daily.  . insulin lispro (HUMALOG) 100 UNIT/ML injection Inject 30-40 Units into the skin See  admin instructions. Inject 40 units into the skin before breakfast and 30 units at bedtime   . lidocaine-prilocaine (EMLA) cream Apply 1 application topically Every Tuesday,Thursday,and Saturday with dialysis.   Marland Kitchen metoprolol tartrate (LOPRESSOR) 25 MG tablet Take 1 tablet (25 mg total) by mouth 2 (two) times daily.  . nitroGLYCERIN (NITROSTAT) 0.4 MG SL tablet Place 1 tablet (0.4 mg total) under the tongue every 5 (five) minutes as needed.  . pantoprazole (PROTONIX) 40 MG tablet Take 1 tablet (40 mg total) by mouth daily.  Vladimir Faster Glycol-Propyl Glycol (LUBRICANT EYE DROPS) 0.4-0.3 % SOLN Place 1 drop into both eyes 3 (three) times daily as needed (dry/irritated eyes.).  Marland Kitchen predniSONE  (DELTASONE) 10 MG tablet Take 10 mg by mouth daily.   . [DISCONTINUED] metoprolol tartrate (LOPRESSOR) 25 MG tablet Take 0.5 tablets (12.5 mg total) by mouth 2 (two) times daily.     Allergies:   Penicillins   Social History   Tobacco Use  . Smoking status: Never Smoker  . Smokeless tobacco: Never Used  Substance Use Topics  . Alcohol use: Never    Frequency: Never  . Drug use: Never     Family Hx: The patient's family history is negative for Colon cancer, Esophageal cancer, and Breast cancer.  ROS:   Please see the history of present illness.    Review of Systems  Cardiovascular: Positive for chest pain.   All other systems reviewed and are negative.   EKGs/Labs/Other Test Reviewed:    EKG:  EKG is  ordered today.  The ekg ordered today demonstrates normal sinus rhythm, heart rate 86, normal axis, nonspecific ST-T wave changes, QTC 481, similar to prior ECGs  Recent Labs: 04/15/2018: ALT 9 04/22/2018: BUN 45; Creatinine, Ser 8.49; Hemoglobin 9.6; Platelets 148; Potassium 4.4; Sodium 135   Recent Lipid Panel Lab Results  Component Value Date/Time   CHOL 120 04/15/2018 09:48 AM   TRIG 92 04/15/2018 09:48 AM   HDL 59 04/15/2018 09:48 AM   CHOLHDL 2.0 04/15/2018 09:48 AM   LDLCALC 43 04/15/2018 09:48 AM    Physical Exam:    VS:  BP 140/70   Pulse 86   Ht 5\' 1"  (1.549 m)   Wt 146 lb (66.2 kg)   BMI 27.59 kg/m     Wt Readings from Last 3 Encounters:  05/08/18 146 lb (66.2 kg)  04/22/18 146 lb 9.6 oz (66.5 kg)  04/15/18 148 lb 6.4 oz (67.3 kg)     Physical Exam  Constitutional: She is oriented to person, place, and time. She appears well-developed and well-nourished. No distress.  HENT:  Head: Normocephalic and atraumatic.  Eyes: No scleral icterus.  Neck: Neck supple. No JVD present. No thyromegaly present.  Cardiovascular: Normal rate, regular rhythm, S1 normal, S2 normal and normal heart sounds.  No murmur heard. Pulmonary/Chest: Effort normal. She has  no rales.  Abdominal: Soft. There is no hepatomegaly.  Musculoskeletal:        General: No edema.     Comments: Right groin with moderate to large hematoma; no bruit; no pulsatile mass  Lymphadenopathy:    She has no cervical adenopathy.  Neurological: She is alert and oriented to person, place, and time.  Skin: Skin is warm and dry.  Psychiatric: She has a normal mood and affect.    ASSESSMENT & PLAN:    Coronary artery disease involving native coronary artery of native heart with angina pectoris ([place])  History of stenting to the RCA in [place]  in 2017.  Recent cardiac catheterization demonstrated distal LCx stenosis which was treated with a DES.  She also has mid RCA 90% stenosis which is to be treated medically.  She continues to have anginal symptoms.  They seem to be somewhat more frequent since she had her PCI.  However, she is having less symptoms during dialysis.  She would like to avoid going through another cardiac catheterization.  We discussed the possibility of proceeding with orbital atherectomy of her RCA disease.  I reviewed this with Dr. Angelena Form (attending MD).  We feel that proceeding with good medical therapy is the best first option.  If she fails this or has lifestyle limiting angina, then we could proceed with intervention on the RCA.  The patient agrees with this approach.  -Continue aspirin, clopidogrel, amlodipine, atorvastatin  -Increase metoprolol to 25 mg twice daily  -Add isosorbide mononitrate 15 mg daily  -Follow-up with Dr. Acie Fredrickson or me in 2 weeks  ESRD (end stage renal disease) ([place]) She goes to dialysis every Tuesday, Thursday, Saturday  Essential hypertension Borderline control.  Adjust medications for anginal control as outlined above.  Hyperlipidemia, unspecified hyperlipidemia type Continue high intensity statin.  R Groin Hematoma Post Cardiac Catheterization  CT was negative for retroperitoneal bleed in the hospital.  She did receive PRBCs.   Her hematoma seems to be getting smaller, slowly.  It appears stable on exam today.  She tells me that she had a recent CBC at dialysis which showed improved hemoglobin.   Dispo:  Return in about 2 weeks (around 05/22/2018) for Close Follow Up, w/ Dr. Acie Fredrickson, or Richardson Dopp, PA-C.   Medication Adjustments/Labs and Tests Ordered: Current medicines are reviewed at length with the patient today.  Concerns regarding medicines are outlined above.  Tests Ordered: Orders Placed This Encounter  Procedures  . EKG 12-Lead   Medication Changes: Meds ordered this encounter  Medications  . isosorbide mononitrate (IMDUR) 30 MG 24 hr tablet    Sig: Take 0.5 tablets (15 mg total) by mouth daily for 90 doses.    Dispense:  45 tablet    Refill:  3  . metoprolol tartrate (LOPRESSOR) 25 MG tablet    Sig: Take 1 tablet (25 mg total) by mouth 2 (two) times daily.    Dispense:  180 tablet    Refill:  3    Signed, Richardson Dopp, PA-C  05/08/2018 9:49 AM    [address], [place], [place]  [address] Phone: [phone]; Fax: [phone]

## 2018-05-08 ENCOUNTER — Ambulatory Visit (INDEPENDENT_AMBULATORY_CARE_PROVIDER_SITE_OTHER): Payer: Medicare (Managed Care) | Admitting: Physician Assistant

## 2018-05-08 ENCOUNTER — Other Ambulatory Visit: Payer: Self-pay

## 2018-05-08 ENCOUNTER — Encounter: Payer: Self-pay | Admitting: Physician Assistant

## 2018-05-08 VITALS — BP 140/70 | HR 86 | Ht 61.0 in | Wt 146.0 lb

## 2018-05-08 DIAGNOSIS — N186 End stage renal disease: Secondary | ICD-10-CM | POA: Diagnosis not present

## 2018-05-08 DIAGNOSIS — I10 Essential (primary) hypertension: Secondary | ICD-10-CM | POA: Diagnosis not present

## 2018-05-08 DIAGNOSIS — I25119 Atherosclerotic heart disease of native coronary artery with unspecified angina pectoris: Secondary | ICD-10-CM

## 2018-05-08 DIAGNOSIS — E785 Hyperlipidemia, unspecified: Secondary | ICD-10-CM

## 2018-05-08 DIAGNOSIS — I97630 Postprocedural hematoma of a circulatory system organ or structure following a cardiac catheterization: Secondary | ICD-10-CM

## 2018-05-08 MED ORDER — METOPROLOL TARTRATE 25 MG PO TABS: 25.0000 mg | ORAL_TABLET | Freq: Two times a day (BID) | ORAL | 3 refills | Status: DC

## 2018-05-08 MED ORDER — ISOSORBIDE MONONITRATE ER 30 MG PO TB24: 15.0000 mg | ORAL_TABLET | Freq: Every day | ORAL | 3 refills | Status: DC

## 2018-05-08 NOTE — Patient Instructions (Signed)
Medication Instructions:   START TAKING METOPROLOL 25 MG TWICE A DAY   START TAKING IMDUR 15 MG ONCE A DAY   If you need a refill on your cardiac medications before your next appointment, please call your pharmacy.   Lab work: NONE ORDERED  TODAY   If you have labs (blood work) drawn today and your tests are completely normal, you will receive your results only by: Marland Kitchen MyChart Message (if you have MyChart) OR . A paper copy in the mail If you have any lab test that is abnormal or we need to change your treatment, we will call you to review the results.   Testing/Procedures:  NONE ORDERED  TODAY    Follow-Up: IN 2 WEEKS WITH [facility]    Any Other Special Instructions Will Be Listed Below (If Applicable).

## 2018-05-16 ENCOUNTER — Telehealth: Payer: Self-pay

## 2018-05-16 NOTE — Telephone Encounter (Signed)
Left message for patient to call back regarding appointment with Dr. Acie Fredrickson on 3/26. Call placed due to restrictions enacted for Covid 19.

## 2018-05-20 NOTE — Telephone Encounter (Signed)
Left detailed message for patient to call back regarding upcoming visit and left information that we are offering virtual visits as an option. I advised her to call back and ask for [facility]

## 2018-05-23 ENCOUNTER — Ambulatory Visit: Payer: Medicare (Managed Care) | Admitting: Cardiovascular Disease

## 2018-05-29 DIAGNOSIS — N186 End stage renal disease: Secondary | ICD-10-CM | POA: Diagnosis not present

## 2018-05-29 DIAGNOSIS — M3214 Glomerular disease in systemic lupus erythematosus: Secondary | ICD-10-CM | POA: Diagnosis not present

## 2018-05-29 DIAGNOSIS — Z992 Dependence on renal dialysis: Secondary | ICD-10-CM | POA: Diagnosis not present

## 2018-05-30 DIAGNOSIS — E876 Hypokalemia: Secondary | ICD-10-CM | POA: Diagnosis not present

## 2018-05-30 DIAGNOSIS — Z23 Encounter for immunization: Secondary | ICD-10-CM | POA: Diagnosis not present

## 2018-05-30 DIAGNOSIS — N186 End stage renal disease: Secondary | ICD-10-CM | POA: Diagnosis not present

## 2018-05-30 DIAGNOSIS — D631 Anemia in chronic kidney disease: Secondary | ICD-10-CM | POA: Diagnosis not present

## 2018-05-30 DIAGNOSIS — N2581 Secondary hyperparathyroidism of renal origin: Secondary | ICD-10-CM | POA: Diagnosis not present

## 2018-05-30 DIAGNOSIS — Z992 Dependence on renal dialysis: Secondary | ICD-10-CM | POA: Diagnosis not present

## 2018-06-01 DIAGNOSIS — N2581 Secondary hyperparathyroidism of renal origin: Secondary | ICD-10-CM | POA: Diagnosis not present

## 2018-06-01 DIAGNOSIS — Z23 Encounter for immunization: Secondary | ICD-10-CM | POA: Diagnosis not present

## 2018-06-01 DIAGNOSIS — Z992 Dependence on renal dialysis: Secondary | ICD-10-CM | POA: Diagnosis not present

## 2018-06-01 DIAGNOSIS — N186 End stage renal disease: Secondary | ICD-10-CM | POA: Diagnosis not present

## 2018-06-01 DIAGNOSIS — E876 Hypokalemia: Secondary | ICD-10-CM | POA: Diagnosis not present

## 2018-06-01 DIAGNOSIS — D631 Anemia in chronic kidney disease: Secondary | ICD-10-CM | POA: Diagnosis not present

## 2018-06-04 DIAGNOSIS — Z23 Encounter for immunization: Secondary | ICD-10-CM | POA: Diagnosis not present

## 2018-06-04 DIAGNOSIS — N2581 Secondary hyperparathyroidism of renal origin: Secondary | ICD-10-CM | POA: Diagnosis not present

## 2018-06-04 DIAGNOSIS — Z992 Dependence on renal dialysis: Secondary | ICD-10-CM | POA: Diagnosis not present

## 2018-06-04 DIAGNOSIS — N186 End stage renal disease: Secondary | ICD-10-CM | POA: Diagnosis not present

## 2018-06-04 DIAGNOSIS — D631 Anemia in chronic kidney disease: Secondary | ICD-10-CM | POA: Diagnosis not present

## 2018-06-04 DIAGNOSIS — E876 Hypokalemia: Secondary | ICD-10-CM | POA: Diagnosis not present

## 2018-06-06 DIAGNOSIS — N2581 Secondary hyperparathyroidism of renal origin: Secondary | ICD-10-CM | POA: Diagnosis not present

## 2018-06-06 DIAGNOSIS — D631 Anemia in chronic kidney disease: Secondary | ICD-10-CM | POA: Diagnosis not present

## 2018-06-06 DIAGNOSIS — Z992 Dependence on renal dialysis: Secondary | ICD-10-CM | POA: Diagnosis not present

## 2018-06-06 DIAGNOSIS — E876 Hypokalemia: Secondary | ICD-10-CM | POA: Diagnosis not present

## 2018-06-06 DIAGNOSIS — N186 End stage renal disease: Secondary | ICD-10-CM | POA: Diagnosis not present

## 2018-06-06 DIAGNOSIS — Z23 Encounter for immunization: Secondary | ICD-10-CM | POA: Diagnosis not present

## 2018-06-08 DIAGNOSIS — E876 Hypokalemia: Secondary | ICD-10-CM | POA: Diagnosis not present

## 2018-06-08 DIAGNOSIS — Z23 Encounter for immunization: Secondary | ICD-10-CM | POA: Diagnosis not present

## 2018-06-08 DIAGNOSIS — N2581 Secondary hyperparathyroidism of renal origin: Secondary | ICD-10-CM | POA: Diagnosis not present

## 2018-06-08 DIAGNOSIS — D631 Anemia in chronic kidney disease: Secondary | ICD-10-CM | POA: Diagnosis not present

## 2018-06-08 DIAGNOSIS — N186 End stage renal disease: Secondary | ICD-10-CM | POA: Diagnosis not present

## 2018-06-08 DIAGNOSIS — Z992 Dependence on renal dialysis: Secondary | ICD-10-CM | POA: Diagnosis not present

## 2018-06-11 ENCOUNTER — Telehealth: Payer: Self-pay

## 2018-06-11 DIAGNOSIS — Z23 Encounter for immunization: Secondary | ICD-10-CM | POA: Diagnosis not present

## 2018-06-11 DIAGNOSIS — N2581 Secondary hyperparathyroidism of renal origin: Secondary | ICD-10-CM | POA: Diagnosis not present

## 2018-06-11 DIAGNOSIS — Z992 Dependence on renal dialysis: Secondary | ICD-10-CM | POA: Diagnosis not present

## 2018-06-11 DIAGNOSIS — E876 Hypokalemia: Secondary | ICD-10-CM | POA: Diagnosis not present

## 2018-06-11 DIAGNOSIS — D631 Anemia in chronic kidney disease: Secondary | ICD-10-CM | POA: Diagnosis not present

## 2018-06-11 DIAGNOSIS — N186 End stage renal disease: Secondary | ICD-10-CM | POA: Diagnosis not present

## 2018-06-11 NOTE — Telephone Encounter (Signed)
Virtual Visit Pre-Appointment Phone Call  Steps For Call:  1. Confirm consent - "In the setting of the current Covid19 crisis, you are scheduled for a (phone or video) visit with your provider on (date) at (time).  Just as we do with many in-office visits, in order for you to participate in this visit, we must obtain consent.  If you'd like, I can send this to your mychart (if signed up) or email for you to review.  Otherwise, I can obtain your verbal consent now.  All virtual visits are billed to your insurance company just like a normal visit would be.  By agreeing to a virtual visit, we'd like you to understand that the technology does not allow for your provider to perform an examination, and thus may limit your provider's ability to fully assess your condition.  Finally, though the technology is pretty good, we cannot assure that it will always work on either your or our end, and in the setting of a video visit, we may have to convert it to a phone-only visit.  In either situation, we cannot ensure that we have a secure connection.  Are you willing to proceed? Yes  2. Confirm the BEST phone number to call the day of the visit by including in appointment notes  3. Give patient instructions for WebEx/MyChart download to smartphone as below or Doximity/Doxy.me if video visit (depending on what platform provider is using)  4. Advise patient to be prepared with their blood pressure, heart rate, weight, any heart rhythm information, their current medicines, and a piece of paper and pen handy for any instructions they may receive the day of their visit  5. Inform patient they will receive a phone call 15 minutes prior to their appointment time (may be from unknown caller ID) so they should be prepared to answer  6. Confirm that appointment type is correct in Epic appointment notes (VIDEO vs PHONE)     TELEPHONE CALL NOTE  Paula Bass has been deemed a candidate for a follow-up tele-health  visit to limit community exposure during the Covid-19 pandemic. I spoke with the patient via phone to ensure availability of phone/video source, confirm preferred email & phone number, and discuss instructions and expectations.  I reminded Paula Bass to be prepared with any vital sign and/or heart rhythm information that could potentially be obtained via home monitoring, at the time of her visit. I reminded Paula Bass to expect a phone call at the time of her visit if her visit.  Paula Bass, [facility] 06/11/2018 4:38 PM   INSTRUCTIONS FOR DOWNLOADING THE [place] APP TO SMARTPHONE  - If Apple, ask patient to go to App Store and type in WebEx in the search bar. [employer], the blue/green circle. If Android, go to [employer] and type in [employer] in the search bar. The app is free but as with any other app downloads, their phone may require them to verify saved payment information or Apple/Android password.  - The patient does NOT have to create an account. - On the day of the visit, the assist will walk the patient through joining the meeting with the meeting number/password.  INSTRUCTIONS FOR DOWNLOADING THE MYCHART APP TO SMARTPHONE  - The patient must first make sure to have activated MyChart and know their login information - If Apple, go to [employer] and type in MyChart in the search bar and download the app. If Android, ask patient to go  to [employer] and type in [place] in the search bar and download the app. The app is free but as with any other app downloads, their phone may require them to verify saved payment information or Apple/Android password.  - The patient will need to then log into the app with their MyChart username and password, and select [facility] as their healthcare provider to link the account. When it is time for your visit, go to the MyChart app, find appointments, and click Begin Video Visit. Be sure to Select Allow for your device to  access the Microphone and Camera for your visit. You will then be connected, and your provider will be with you shortly.  **If they have any issues connecting, or need assistance please contact MyChart service desk [phone])**  **If using a computer, in order to ensure the best quality for their visit they will need to use either of the following Internet Browsers: [employer], or Google Chrome**  IF USING DOXIMITY or DOXY.ME - The patient will receive a link just prior to their visit, either by text or email (to be determined day of appointment depending on if it's doxy.me or Doximity).     FULL LENGTH CONSENT FOR TELE-HEALTH VISIT   I hereby voluntarily request, consent and authorize [facility] and its employed or contracted physicians, physician assistants, nurse practitioners or other licensed health care professionals (the Practitioner), to provide me with telemedicine health care services (the Services") as deemed necessary by the treating Practitioner. I acknowledge and consent to receive the Services by the Practitioner via telemedicine. I understand that the telemedicine visit will involve communicating with the Practitioner through live audiovisual communication technology and the disclosure of certain medical information by electronic transmission. I acknowledge that I have been given the opportunity to request an in-person assessment or other available alternative prior to the telemedicine visit and am voluntarily participating in the telemedicine visit.  I understand that I have the right to withhold or withdraw my consent to the use of telemedicine in the course of my care at any time, without affecting my right to future care or treatment, and that the Practitioner or I may terminate the telemedicine visit at any time. I understand that I have the right to inspect all information obtained and/or recorded in the course of the telemedicine visit and may receive  copies of available information for a reasonable fee.  I understand that some of the potential risks of receiving the Services via telemedicine include:   Delay or interruption in medical evaluation due to technological equipment failure or disruption;  Information transmitted may not be sufficient (e.g. poor resolution of images) to allow for appropriate medical decision making by the Practitioner; and/or   In rare instances, security protocols could fail, causing a breach of personal health information.  Furthermore, I acknowledge that it is my responsibility to provide information about my medical history, conditions and care that is complete and accurate to the best of my ability. I acknowledge that Practitioner's advice, recommendations, and/or decision may be based on factors not within their control, such as incomplete or inaccurate data provided by me or distortions of diagnostic images or specimens that may result from electronic transmissions. I understand that the practice of medicine is not an exact science and that Practitioner makes no warranties or guarantees regarding treatment outcomes. I acknowledge that I will receive a copy of this consent concurrently upon execution via email to the email address I last provided  but may also request a printed copy by calling the office of [facility].    I understand that my insurance will be billed for this visit.   I have read or had this consent read to me.  I understand the contents of this consent, which adequately explains the benefits and risks of the Services being provided via telemedicine.   I have been provided ample opportunity to ask questions regarding this consent and the Services and have had my questions answered to my satisfaction.  I give my informed consent for the services to be provided through the use of telemedicine in my medical care  By participating in this telemedicine visit I agree to the above.

## 2018-06-12 ENCOUNTER — Telehealth: Payer: Self-pay | Admitting: Cardiovascular Disease

## 2018-06-12 NOTE — Telephone Encounter (Signed)
Spoke with patient who confirmed all demographics.  She has a Stage manager.  She successfully sign into My Chart.

## 2018-06-13 ENCOUNTER — Other Ambulatory Visit: Payer: Self-pay

## 2018-06-13 ENCOUNTER — Encounter: Payer: Self-pay | Admitting: Cardiovascular Disease

## 2018-06-13 ENCOUNTER — Telehealth (INDEPENDENT_AMBULATORY_CARE_PROVIDER_SITE_OTHER): Payer: Medicare Other | Admitting: Cardiovascular Disease

## 2018-06-13 VITALS — BP 140/80 | HR 106 | Ht 61.0 in | Wt 145.0 lb

## 2018-06-13 DIAGNOSIS — N186 End stage renal disease: Secondary | ICD-10-CM | POA: Diagnosis not present

## 2018-06-13 DIAGNOSIS — Z992 Dependence on renal dialysis: Secondary | ICD-10-CM | POA: Diagnosis not present

## 2018-06-13 DIAGNOSIS — Z7189 Other specified counseling: Secondary | ICD-10-CM

## 2018-06-13 DIAGNOSIS — D631 Anemia in chronic kidney disease: Secondary | ICD-10-CM | POA: Diagnosis not present

## 2018-06-13 DIAGNOSIS — I25119 Atherosclerotic heart disease of native coronary artery with unspecified angina pectoris: Secondary | ICD-10-CM

## 2018-06-13 DIAGNOSIS — E876 Hypokalemia: Secondary | ICD-10-CM | POA: Diagnosis not present

## 2018-06-13 DIAGNOSIS — Z23 Encounter for immunization: Secondary | ICD-10-CM | POA: Diagnosis not present

## 2018-06-13 DIAGNOSIS — N2581 Secondary hyperparathyroidism of renal origin: Secondary | ICD-10-CM | POA: Diagnosis not present

## 2018-06-13 NOTE — Patient Instructions (Addendum)
Medication Instructions:  Your physician recommends that you continue on your current medications as directed. Please refer to the Current Medication list given to you today.  Take your Imdur (Isosorbide) every day   If you need a refill on your cardiac medications before your next appointment, please call your pharmacy.    Lab work: None Ordered    Testing/Procedures: None Ordered   Follow-Up: At [facility], you and your health needs are our priority.  As part of our continuing mission to provide you with exceptional heart care, we have created designated Provider Care Teams.  These Care Teams include your primary Cardiologist (physician) and Advanced Practice Providers (APPs -  Physician Assistants and Nurse Practitioners) who all work together to provide you with the care you need, when you need it. You will need a follow up appointment in:  3-4 months.  Please call our office 2 months in advance to schedule this appointment.  You may see Mertie Moores, MD or one of the following Advanced Practice Providers on your designated Care Team: Richardson Dopp, PA-C [place], [place] . Daune Perch, NP

## 2018-06-13 NOTE — Progress Notes (Signed)
Virtual Visit via Video Note   This visit type was conducted due to national recommendations for restrictions regarding the COVID-19 Pandemic (e.g. social distancing) in an effort to limit this patient's exposure and mitigate transmission in our community.  Due to her co-morbid illnesses, this patient is at least at moderate risk for complications without adequate follow up.  This format is felt to be most appropriate for this patient at this time.  All issues noted in this document were discussed and addressed.  A limited physical exam was performed with this format.  Please refer to the patient's chart for her consent to telehealth for [facility].   Evaluation Performed:  Follow-up visit  Date:  06/13/2018   ID:  Paula Bass, DOB [birth year], MRN [id number]  Patient Location: Home Provider Location: Home PCP:  Benito Mccreedy, MD  Cardiologist:  Mertie Moores, MD  Electrophysiologist:  None   Chief Complaint:  CAD   History of Present Illness:    Paula Bass is a 48 y.o. female with a history of coronary artery disease, end-stage renal disease, diabetes mellitus, hypertension, lupus.  I saw her initially in February, 2020.  At that time she presented with a history of coronary stenting while up in [place] in 2017.  She presented with symptoms consistent with unstable angina.  We set her up for heart catheterization.  FINDINGS: 1. Significant 2-vessel CAD, with 80% mid/distal LCx and 90% mid RCA stenoses. 2. Moderate, non-obstructive LAD disease. 3. Patent stent in proximal RCA. 4. Normal LVEF and LVEDP. 5. Successful PCI to mid/distal LCx using Synergy 2.25 x 20 mm DES with 0% residual stenosis and TIMI-3 flow.  The plan was to continue with medical therapy.  If she has recurrent angina, we will consider PCI of the right coronary artery with orbital atherectomy.  She saw Richardson Dopp, PA on May 08, 2018.  At that time she was continuing to have some episodes of  angina.  Her metoprolol was increased to 25 mg a day.  Isosorbide mononitrate 15 mg a day was added.  She is scheduled to see me again for follow-up visit.   She is feeling ok Gets a HR with the Imdur Is helping with the cp . Has lots of belching ,  Seems to have more chest pain with this indigestion  Able to walk with out any angina   Dialysis is going well  Watching her diet.   The patient does not have symptoms concerning for COVID-19 infection (fever, chills, cough, or new shortness of breath).  No fever.  No cough Has lost her since of smell after a URI   Past Medical History:  Diagnosis Date  . CAD (coronary artery disease) 2016  . CAD (coronary artery disease) 04/19/2018   Hx of PCI to [facility] in [place] in 2017 // [place] 03/2018: dLCx 35, OM1 40, OM2 50, pRCA stent ok, mid RCA 90, EF 55-65, PCI: DES to dLCx  . Diabetes mellitus without complication ([place])   . Dialysis patient ([place])   . Diverticulitis   . GI bleeding   . Hypertension   . Lupus ([place])   . Renal disorder    dialysis   Past Surgical History:  Procedure Laterality Date  . CARDIAC SURGERY     cardiac shunt  . CORONARY STENT INTERVENTION N/A 04/19/2018   Procedure: CORONARY STENT INTERVENTION;  Surgeon: [name], MD;  Location: [facility];  Service: Cardiovascular;  Laterality: N/A;  . Kidney graft Left   .  LEFT HEART CATH AND CORONARY ANGIOGRAPHY N/A 04/19/2018   Procedure: LEFT HEART CATH AND CORONARY ANGIOGRAPHY;  Surgeon: [name], MD;  Location: [facility];  Service: Cardiovascular;  Laterality: N/A;     Current Meds  Medication Sig  . amLODipine (NORVASC) 10 MG tablet Take 10 mg by mouth daily.  Marland Kitchen aspirin EC 81 MG tablet Take 1 tablet (81 mg total) by mouth daily.  Marland Kitchen atorvastatin (LIPITOR) 40 MG tablet Take 40 mg by mouth daily.   . clopidogrel (PLAVIX) 75 MG tablet Take 1 tablet (75 mg total) by mouth daily.  . insulin lispro (HUMALOG) 100 UNIT/ML injection Inject 30-40 Units  into the skin See admin instructions. Inject 40 units into the skin before breakfast and 30 units at bedtime   . isosorbide mononitrate (IMDUR) 30 MG 24 hr tablet Take 0.5 tablets (15 mg total) by mouth daily for 90 doses.  Marland Kitchen lidocaine-prilocaine (EMLA) cream Apply 1 application topically Every Tuesday,Thursday,and Saturday with dialysis.   Marland Kitchen metoprolol tartrate (LOPRESSOR) 25 MG tablet Take 1 tablet (25 mg total) by mouth 2 (two) times daily.  . nitroGLYCERIN (NITROSTAT) 0.4 MG SL tablet Place 1 tablet (0.4 mg total) under the tongue every 5 (five) minutes as needed.  . pantoprazole (PROTONIX) 40 MG tablet Take 1 tablet (40 mg total) by mouth daily.  Vladimir Faster Glycol-Propyl Glycol (LUBRICANT EYE DROPS) 0.4-0.3 % SOLN Place 1 drop into both eyes 3 (three) times daily as needed (dry/irritated eyes.).  Marland Kitchen predniSONE (DELTASONE) 10 MG tablet Take 10 mg by mouth daily.      Allergies:   Penicillins   Social History   Tobacco Use  . Smoking status: Never Smoker  . Smokeless tobacco: Never Used  Substance Use Topics  . Alcohol use: Never    Frequency: Never  . Drug use: Never     Family Hx: The patient's family history is negative for Colon cancer, Esophageal cancer, and Breast cancer.  ROS:   Please see the history of present illness.     All other systems reviewed and are negative.   Prior CV studies:   The following studies were reviewed today:    Labs/Other Tests and Data Reviewed:    EKG:  No ECG reviewed.  Recent Labs: 04/15/2018: ALT 9 04/22/2018: BUN 45; Creatinine, Ser 8.49; Hemoglobin 9.6; Platelets 148; Potassium 4.4; Sodium 135   Recent Lipid Panel Lab Results  Component Value Date/Time   CHOL 120 04/15/2018 09:48 AM   TRIG 92 04/15/2018 09:48 AM   HDL 59 04/15/2018 09:48 AM   CHOLHDL 2.0 04/15/2018 09:48 AM   LDLCALC 43 04/15/2018 09:48 AM    Wt Readings from Last 3 Encounters:  06/13/18 145 lb (65.8 kg)  05/08/18 146 lb (66.2 kg)  04/22/18 146 lb 9.6  oz (66.5 kg)     Objective:    Vital Signs:  BP 140/80 (BP Location: Right Arm, Patient Position: Sitting, Cuff Size: Normal)   Pulse (!) 106   Ht 5\' 1"  (1.549 m)   Wt 145 lb (65.8 kg)   BMI 27.40 kg/m     General:   Appears healthy,   NAD HEENT:   No obvious JVD or lymphadenopathy Resp:   Normal work of breathing,   resp rate is normal  CV :   BP and HR are normal ,  No edema Abd:   No abdomina swelling , Ext:   No clubbing, cyanosis, or edema  Neuro:   Alert and oriented x  3.   No obvious motor deficits Skin : no obvious rashes    ASSESSMENT & PLAN:    1. CAD :   Seems to be doing well.  Cont current meds.   Explained when she should take NTG.   She has not had any significant CP .  Cont current meds. Will have her see Richardson Dopp, PA in 3 months .    COVID-19 Education: The signs and symptoms of COVID-19 were discussed with the patient and how to seek care for testing (follow up with PCP or arrange E-visit).  The importance of social distancing was discussed today.  Time:   Today, I have spent  16  minutes with the patient with telehealth technology discussing the above problems.  13 additional minutes in charting / precharting.    Medication Adjustments/Labs and Tests Ordered: Current medicines are reviewed at length with the patient today.  Concerns regarding medicines are outlined above.   Tests Ordered: No orders of the defined types were placed in this encounter.   Medication Changes: No orders of the defined types were placed in this encounter.   Disposition:  Follow up in 3 month(s)  Signed, Mertie Moores, MD  06/13/2018 1:21 PM    [facility]

## 2018-06-15 DIAGNOSIS — N186 End stage renal disease: Secondary | ICD-10-CM | POA: Diagnosis not present

## 2018-06-15 DIAGNOSIS — Z992 Dependence on renal dialysis: Secondary | ICD-10-CM | POA: Diagnosis not present

## 2018-06-15 DIAGNOSIS — Z23 Encounter for immunization: Secondary | ICD-10-CM | POA: Diagnosis not present

## 2018-06-15 DIAGNOSIS — E876 Hypokalemia: Secondary | ICD-10-CM | POA: Diagnosis not present

## 2018-06-15 DIAGNOSIS — D631 Anemia in chronic kidney disease: Secondary | ICD-10-CM | POA: Diagnosis not present

## 2018-06-15 DIAGNOSIS — N2581 Secondary hyperparathyroidism of renal origin: Secondary | ICD-10-CM | POA: Diagnosis not present

## 2018-06-18 DIAGNOSIS — E876 Hypokalemia: Secondary | ICD-10-CM | POA: Diagnosis not present

## 2018-06-18 DIAGNOSIS — N2581 Secondary hyperparathyroidism of renal origin: Secondary | ICD-10-CM | POA: Diagnosis not present

## 2018-06-18 DIAGNOSIS — N186 End stage renal disease: Secondary | ICD-10-CM | POA: Diagnosis not present

## 2018-06-18 DIAGNOSIS — Z23 Encounter for immunization: Secondary | ICD-10-CM | POA: Diagnosis not present

## 2018-06-18 DIAGNOSIS — Z992 Dependence on renal dialysis: Secondary | ICD-10-CM | POA: Diagnosis not present

## 2018-06-18 DIAGNOSIS — D631 Anemia in chronic kidney disease: Secondary | ICD-10-CM | POA: Diagnosis not present

## 2018-06-20 DIAGNOSIS — N186 End stage renal disease: Secondary | ICD-10-CM | POA: Diagnosis not present

## 2018-06-20 DIAGNOSIS — D631 Anemia in chronic kidney disease: Secondary | ICD-10-CM | POA: Diagnosis not present

## 2018-06-20 DIAGNOSIS — Z992 Dependence on renal dialysis: Secondary | ICD-10-CM | POA: Diagnosis not present

## 2018-06-20 DIAGNOSIS — E876 Hypokalemia: Secondary | ICD-10-CM | POA: Diagnosis not present

## 2018-06-20 DIAGNOSIS — Z23 Encounter for immunization: Secondary | ICD-10-CM | POA: Diagnosis not present

## 2018-06-20 DIAGNOSIS — N2581 Secondary hyperparathyroidism of renal origin: Secondary | ICD-10-CM | POA: Diagnosis not present

## 2018-06-20 DIAGNOSIS — E119 Type 2 diabetes mellitus without complications: Secondary | ICD-10-CM | POA: Diagnosis not present

## 2018-06-22 DIAGNOSIS — Z992 Dependence on renal dialysis: Secondary | ICD-10-CM | POA: Diagnosis not present

## 2018-06-22 DIAGNOSIS — D631 Anemia in chronic kidney disease: Secondary | ICD-10-CM | POA: Diagnosis not present

## 2018-06-22 DIAGNOSIS — E876 Hypokalemia: Secondary | ICD-10-CM | POA: Diagnosis not present

## 2018-06-22 DIAGNOSIS — Z23 Encounter for immunization: Secondary | ICD-10-CM | POA: Diagnosis not present

## 2018-06-22 DIAGNOSIS — N186 End stage renal disease: Secondary | ICD-10-CM | POA: Diagnosis not present

## 2018-06-22 DIAGNOSIS — N2581 Secondary hyperparathyroidism of renal origin: Secondary | ICD-10-CM | POA: Diagnosis not present

## 2018-06-24 ENCOUNTER — Telehealth: Payer: Self-pay

## 2018-06-24 DIAGNOSIS — Z992 Dependence on renal dialysis: Secondary | ICD-10-CM | POA: Diagnosis not present

## 2018-06-24 DIAGNOSIS — Z794 Long term (current) use of insulin: Secondary | ICD-10-CM | POA: Diagnosis not present

## 2018-06-24 DIAGNOSIS — N186 End stage renal disease: Secondary | ICD-10-CM | POA: Diagnosis not present

## 2018-06-24 DIAGNOSIS — E663 Overweight: Secondary | ICD-10-CM | POA: Diagnosis not present

## 2018-06-24 DIAGNOSIS — G629 Polyneuropathy, unspecified: Secondary | ICD-10-CM | POA: Diagnosis not present

## 2018-06-24 DIAGNOSIS — E1122 Type 2 diabetes mellitus with diabetic chronic kidney disease: Secondary | ICD-10-CM | POA: Diagnosis not present

## 2018-06-24 DIAGNOSIS — I10 Essential (primary) hypertension: Secondary | ICD-10-CM | POA: Diagnosis not present

## 2018-06-24 NOTE — Telephone Encounter (Signed)
It appears this was routed to everyone because the original message was sent to C CV DIV Preop instead of P CV DIV Preop. Anyone who saw this message in error, disregard. Multiple people have since sent to the pre-op pool.

## 2018-06-24 NOTE — Telephone Encounter (Signed)
Pt just had coronary stenting 04/19/2018. She is on ASA 81 mg a day and Plavix 75 mg a day and the recommendation from Dr. Saunders Revel that she should ideally continue the DAPT for 12 months.  I would not suggest any elective surgical procedures until Feb. 2021. If her dialysis is going well, I would suggest that we delay any plans for renal transplantation and continue with the dialysis .  If for some reason continuing current that dialysis treatment is not feasible then we will need to discuss this with the nephrologist.  Stopping the aspirin Plavix at this point will put her at high risk for subacute stent thrombosis.    Mertie Moores, MD  06/24/2018 5:12 PM    [facility],  [address] [place], [place]  [address] Pager [phone] Phone: [phone]; Fax: [phone]

## 2018-06-24 NOTE — Telephone Encounter (Signed)
Dr. Acie Fredrickson just had an evisit with the patient so I'm forwarding to him as it's his patient.

## 2018-06-24 NOTE — Telephone Encounter (Signed)
Not sure why this came to me

## 2018-06-24 NOTE — Telephone Encounter (Signed)
    [facility] Pre-operative Risk Assessment    Request for surgical clearance:  1. What type of surgery is being performed? Kidney Transplant   2. When is this surgery scheduled? TBD  3. What type of clearance is required (medical clearance vs. Pharmacy clearance to hold med vs. Both)? Medical  4. Are there any medications that need to be held prior to surgery and how long? None listed   5. Practice name and name of physician performing surgery? [facility]  6. What is your office phone number: [phone]     7.   What is your office fax number: [phone]  8.   Anesthesia type (None, local, MAC, general) ? None listed   Jacinta Shoe 06/24/2018, 2:57 PM  _________________________________________________________________   (provider comments below)

## 2018-06-25 DIAGNOSIS — Z992 Dependence on renal dialysis: Secondary | ICD-10-CM | POA: Diagnosis not present

## 2018-06-25 DIAGNOSIS — N186 End stage renal disease: Secondary | ICD-10-CM | POA: Diagnosis not present

## 2018-06-25 DIAGNOSIS — E876 Hypokalemia: Secondary | ICD-10-CM | POA: Diagnosis not present

## 2018-06-25 DIAGNOSIS — N2581 Secondary hyperparathyroidism of renal origin: Secondary | ICD-10-CM | POA: Diagnosis not present

## 2018-06-25 DIAGNOSIS — Z23 Encounter for immunization: Secondary | ICD-10-CM | POA: Diagnosis not present

## 2018-06-25 DIAGNOSIS — D631 Anemia in chronic kidney disease: Secondary | ICD-10-CM | POA: Diagnosis not present

## 2018-06-25 NOTE — Telephone Encounter (Signed)
Paula Bass is medically cleared to have her renal transplant. I do want her to continue DAPT for 12 months. If the surgeon is willing to perform the surgery while on DAPT, she is at low - moderate risk .    Mertie Moores, MD  06/25/2018 10:55 AM    [facility],  [name], [name]  [address] Pager [phone] Phone: [phone]; Fax: [phone]

## 2018-06-25 NOTE — Telephone Encounter (Addendum)
Dr. Acie Fredrickson It appears this clearance requested medical clearance. I tried to contact the listed surgeon's office without success. Just to clarify your recommendation: If they are willing to do surgery without interrupting DAPT, is the patient medically cleared? Thank you

## 2018-06-25 NOTE — Telephone Encounter (Signed)
   Primary Cardiologist: Mertie Moores, MD  Chart reviewed as part of pre-operative protocol coverage.  Paula Bass was last seen on 06/13/18 by Dr. Acie Fredrickson. Patient had a recent PCI 04/19/18 and will require uninterrupted DAPT for 12 months.   Per Dr. Acie Fredrickson: Pt just had coronary stenting 04/19/2018. She is on ASA 81 mg a day and Plavix 75 mg a day and the recommendation from Dr. Saunders Revel that she should ideally continue the DAPT for 12 months.  I would not suggest any elective surgical procedures until Feb. 2021.  If her dialysis is going well, I would suggest that we delay any plans for renal transplantation and continue with the dialysis .  If for some reason continuing current that dialysis treatment is not feasible then we will need to discuss this with the nephrologist.  Stopping the aspirin Plavix at this point will put her at high risk for subacute stent thrombosis.  If the surgeon is willing to proceed with surgery on DAPT, she is at low to moderate risk for cardiac complications.   I will route this recommendation to the requesting party via Epic fax function and remove from pre-op pool.  Please call with questions.  Tami Lin Duke, PA 06/25/2018, 11:27 AM

## 2018-06-27 ENCOUNTER — Ambulatory Visit: Payer: Medicare Other

## 2018-06-27 DIAGNOSIS — Z992 Dependence on renal dialysis: Secondary | ICD-10-CM | POA: Diagnosis not present

## 2018-06-27 DIAGNOSIS — N186 End stage renal disease: Secondary | ICD-10-CM | POA: Diagnosis not present

## 2018-06-27 DIAGNOSIS — N2581 Secondary hyperparathyroidism of renal origin: Secondary | ICD-10-CM | POA: Diagnosis not present

## 2018-06-27 DIAGNOSIS — Z23 Encounter for immunization: Secondary | ICD-10-CM | POA: Diagnosis not present

## 2018-06-27 DIAGNOSIS — E876 Hypokalemia: Secondary | ICD-10-CM | POA: Diagnosis not present

## 2018-06-27 DIAGNOSIS — D631 Anemia in chronic kidney disease: Secondary | ICD-10-CM | POA: Diagnosis not present

## 2018-06-28 DIAGNOSIS — Z992 Dependence on renal dialysis: Secondary | ICD-10-CM | POA: Diagnosis not present

## 2018-06-28 DIAGNOSIS — N186 End stage renal disease: Secondary | ICD-10-CM | POA: Diagnosis not present

## 2018-06-28 DIAGNOSIS — M3214 Glomerular disease in systemic lupus erythematosus: Secondary | ICD-10-CM | POA: Diagnosis not present

## 2018-06-29 DIAGNOSIS — Z992 Dependence on renal dialysis: Secondary | ICD-10-CM | POA: Diagnosis not present

## 2018-06-29 DIAGNOSIS — N2581 Secondary hyperparathyroidism of renal origin: Secondary | ICD-10-CM | POA: Diagnosis not present

## 2018-06-29 DIAGNOSIS — N186 End stage renal disease: Secondary | ICD-10-CM | POA: Diagnosis not present

## 2018-06-29 DIAGNOSIS — E876 Hypokalemia: Secondary | ICD-10-CM | POA: Diagnosis not present

## 2018-07-02 DIAGNOSIS — N186 End stage renal disease: Secondary | ICD-10-CM | POA: Diagnosis not present

## 2018-07-02 DIAGNOSIS — N2581 Secondary hyperparathyroidism of renal origin: Secondary | ICD-10-CM | POA: Diagnosis not present

## 2018-07-02 DIAGNOSIS — Z992 Dependence on renal dialysis: Secondary | ICD-10-CM | POA: Diagnosis not present

## 2018-07-02 DIAGNOSIS — E876 Hypokalemia: Secondary | ICD-10-CM | POA: Diagnosis not present

## 2018-07-04 DIAGNOSIS — N2581 Secondary hyperparathyroidism of renal origin: Secondary | ICD-10-CM | POA: Diagnosis not present

## 2018-07-04 DIAGNOSIS — E876 Hypokalemia: Secondary | ICD-10-CM | POA: Diagnosis not present

## 2018-07-04 DIAGNOSIS — Z992 Dependence on renal dialysis: Secondary | ICD-10-CM | POA: Diagnosis not present

## 2018-07-04 DIAGNOSIS — N186 End stage renal disease: Secondary | ICD-10-CM | POA: Diagnosis not present

## 2018-07-06 DIAGNOSIS — N2581 Secondary hyperparathyroidism of renal origin: Secondary | ICD-10-CM | POA: Diagnosis not present

## 2018-07-06 DIAGNOSIS — E876 Hypokalemia: Secondary | ICD-10-CM | POA: Diagnosis not present

## 2018-07-06 DIAGNOSIS — N186 End stage renal disease: Secondary | ICD-10-CM | POA: Diagnosis not present

## 2018-07-06 DIAGNOSIS — Z992 Dependence on renal dialysis: Secondary | ICD-10-CM | POA: Diagnosis not present

## 2018-07-09 DIAGNOSIS — E876 Hypokalemia: Secondary | ICD-10-CM | POA: Diagnosis not present

## 2018-07-09 DIAGNOSIS — Z992 Dependence on renal dialysis: Secondary | ICD-10-CM | POA: Diagnosis not present

## 2018-07-09 DIAGNOSIS — N2581 Secondary hyperparathyroidism of renal origin: Secondary | ICD-10-CM | POA: Diagnosis not present

## 2018-07-09 DIAGNOSIS — N186 End stage renal disease: Secondary | ICD-10-CM | POA: Diagnosis not present

## 2018-07-11 DIAGNOSIS — N186 End stage renal disease: Secondary | ICD-10-CM | POA: Diagnosis not present

## 2018-07-11 DIAGNOSIS — Z992 Dependence on renal dialysis: Secondary | ICD-10-CM | POA: Diagnosis not present

## 2018-07-11 DIAGNOSIS — N2581 Secondary hyperparathyroidism of renal origin: Secondary | ICD-10-CM | POA: Diagnosis not present

## 2018-07-11 DIAGNOSIS — E876 Hypokalemia: Secondary | ICD-10-CM | POA: Diagnosis not present

## 2018-07-13 DIAGNOSIS — N2581 Secondary hyperparathyroidism of renal origin: Secondary | ICD-10-CM | POA: Diagnosis not present

## 2018-07-13 DIAGNOSIS — N186 End stage renal disease: Secondary | ICD-10-CM | POA: Diagnosis not present

## 2018-07-13 DIAGNOSIS — Z992 Dependence on renal dialysis: Secondary | ICD-10-CM | POA: Diagnosis not present

## 2018-07-13 DIAGNOSIS — E876 Hypokalemia: Secondary | ICD-10-CM | POA: Diagnosis not present

## 2018-07-16 DIAGNOSIS — Z992 Dependence on renal dialysis: Secondary | ICD-10-CM | POA: Diagnosis not present

## 2018-07-16 DIAGNOSIS — N2581 Secondary hyperparathyroidism of renal origin: Secondary | ICD-10-CM | POA: Diagnosis not present

## 2018-07-16 DIAGNOSIS — N186 End stage renal disease: Secondary | ICD-10-CM | POA: Diagnosis not present

## 2018-07-16 DIAGNOSIS — E876 Hypokalemia: Secondary | ICD-10-CM | POA: Diagnosis not present

## 2018-07-18 DIAGNOSIS — E876 Hypokalemia: Secondary | ICD-10-CM | POA: Diagnosis not present

## 2018-07-18 DIAGNOSIS — Z992 Dependence on renal dialysis: Secondary | ICD-10-CM | POA: Diagnosis not present

## 2018-07-18 DIAGNOSIS — N2581 Secondary hyperparathyroidism of renal origin: Secondary | ICD-10-CM | POA: Diagnosis not present

## 2018-07-18 DIAGNOSIS — N186 End stage renal disease: Secondary | ICD-10-CM | POA: Diagnosis not present

## 2018-07-23 DIAGNOSIS — Z992 Dependence on renal dialysis: Secondary | ICD-10-CM | POA: Diagnosis not present

## 2018-07-23 DIAGNOSIS — N186 End stage renal disease: Secondary | ICD-10-CM | POA: Diagnosis not present

## 2018-07-23 DIAGNOSIS — N2581 Secondary hyperparathyroidism of renal origin: Secondary | ICD-10-CM | POA: Diagnosis not present

## 2018-07-23 DIAGNOSIS — E876 Hypokalemia: Secondary | ICD-10-CM | POA: Diagnosis not present

## 2018-07-25 DIAGNOSIS — Z992 Dependence on renal dialysis: Secondary | ICD-10-CM | POA: Diagnosis not present

## 2018-07-25 DIAGNOSIS — N2581 Secondary hyperparathyroidism of renal origin: Secondary | ICD-10-CM | POA: Diagnosis not present

## 2018-07-25 DIAGNOSIS — N186 End stage renal disease: Secondary | ICD-10-CM | POA: Diagnosis not present

## 2018-07-25 DIAGNOSIS — E876 Hypokalemia: Secondary | ICD-10-CM | POA: Diagnosis not present

## 2018-07-27 DIAGNOSIS — E876 Hypokalemia: Secondary | ICD-10-CM | POA: Diagnosis not present

## 2018-07-27 DIAGNOSIS — Z992 Dependence on renal dialysis: Secondary | ICD-10-CM | POA: Diagnosis not present

## 2018-07-27 DIAGNOSIS — N186 End stage renal disease: Secondary | ICD-10-CM | POA: Diagnosis not present

## 2018-07-27 DIAGNOSIS — N2581 Secondary hyperparathyroidism of renal origin: Secondary | ICD-10-CM | POA: Diagnosis not present

## 2018-07-29 DIAGNOSIS — Z992 Dependence on renal dialysis: Secondary | ICD-10-CM | POA: Diagnosis not present

## 2018-07-29 DIAGNOSIS — N186 End stage renal disease: Secondary | ICD-10-CM | POA: Diagnosis not present

## 2018-07-29 DIAGNOSIS — M3214 Glomerular disease in systemic lupus erythematosus: Secondary | ICD-10-CM | POA: Diagnosis not present

## 2018-07-30 DIAGNOSIS — E876 Hypokalemia: Secondary | ICD-10-CM | POA: Diagnosis not present

## 2018-07-30 DIAGNOSIS — Z992 Dependence on renal dialysis: Secondary | ICD-10-CM | POA: Diagnosis not present

## 2018-07-30 DIAGNOSIS — N186 End stage renal disease: Secondary | ICD-10-CM | POA: Diagnosis not present

## 2018-07-30 DIAGNOSIS — N2581 Secondary hyperparathyroidism of renal origin: Secondary | ICD-10-CM | POA: Diagnosis not present

## 2018-07-30 DIAGNOSIS — D631 Anemia in chronic kidney disease: Secondary | ICD-10-CM | POA: Diagnosis not present

## 2018-08-01 DIAGNOSIS — Z992 Dependence on renal dialysis: Secondary | ICD-10-CM | POA: Diagnosis not present

## 2018-08-01 DIAGNOSIS — N2581 Secondary hyperparathyroidism of renal origin: Secondary | ICD-10-CM | POA: Diagnosis not present

## 2018-08-01 DIAGNOSIS — D631 Anemia in chronic kidney disease: Secondary | ICD-10-CM | POA: Diagnosis not present

## 2018-08-01 DIAGNOSIS — N186 End stage renal disease: Secondary | ICD-10-CM | POA: Diagnosis not present

## 2018-08-01 DIAGNOSIS — E876 Hypokalemia: Secondary | ICD-10-CM | POA: Diagnosis not present

## 2018-08-03 DIAGNOSIS — N2581 Secondary hyperparathyroidism of renal origin: Secondary | ICD-10-CM | POA: Diagnosis not present

## 2018-08-03 DIAGNOSIS — D631 Anemia in chronic kidney disease: Secondary | ICD-10-CM | POA: Diagnosis not present

## 2018-08-03 DIAGNOSIS — Z992 Dependence on renal dialysis: Secondary | ICD-10-CM | POA: Diagnosis not present

## 2018-08-03 DIAGNOSIS — N186 End stage renal disease: Secondary | ICD-10-CM | POA: Diagnosis not present

## 2018-08-03 DIAGNOSIS — E876 Hypokalemia: Secondary | ICD-10-CM | POA: Diagnosis not present

## 2018-08-06 DIAGNOSIS — D631 Anemia in chronic kidney disease: Secondary | ICD-10-CM | POA: Diagnosis not present

## 2018-08-06 DIAGNOSIS — N2581 Secondary hyperparathyroidism of renal origin: Secondary | ICD-10-CM | POA: Diagnosis not present

## 2018-08-06 DIAGNOSIS — Z992 Dependence on renal dialysis: Secondary | ICD-10-CM | POA: Diagnosis not present

## 2018-08-06 DIAGNOSIS — E876 Hypokalemia: Secondary | ICD-10-CM | POA: Diagnosis not present

## 2018-08-06 DIAGNOSIS — N186 End stage renal disease: Secondary | ICD-10-CM | POA: Diagnosis not present

## 2018-08-08 DIAGNOSIS — E876 Hypokalemia: Secondary | ICD-10-CM | POA: Diagnosis not present

## 2018-08-08 DIAGNOSIS — N186 End stage renal disease: Secondary | ICD-10-CM | POA: Diagnosis not present

## 2018-08-08 DIAGNOSIS — N2581 Secondary hyperparathyroidism of renal origin: Secondary | ICD-10-CM | POA: Diagnosis not present

## 2018-08-08 DIAGNOSIS — D631 Anemia in chronic kidney disease: Secondary | ICD-10-CM | POA: Diagnosis not present

## 2018-08-08 DIAGNOSIS — Z992 Dependence on renal dialysis: Secondary | ICD-10-CM | POA: Diagnosis not present

## 2018-08-10 DIAGNOSIS — N2581 Secondary hyperparathyroidism of renal origin: Secondary | ICD-10-CM | POA: Diagnosis not present

## 2018-08-10 DIAGNOSIS — N186 End stage renal disease: Secondary | ICD-10-CM | POA: Diagnosis not present

## 2018-08-10 DIAGNOSIS — Z992 Dependence on renal dialysis: Secondary | ICD-10-CM | POA: Diagnosis not present

## 2018-08-10 DIAGNOSIS — E876 Hypokalemia: Secondary | ICD-10-CM | POA: Diagnosis not present

## 2018-08-10 DIAGNOSIS — D631 Anemia in chronic kidney disease: Secondary | ICD-10-CM | POA: Diagnosis not present

## 2018-08-13 DIAGNOSIS — D631 Anemia in chronic kidney disease: Secondary | ICD-10-CM | POA: Diagnosis not present

## 2018-08-13 DIAGNOSIS — E876 Hypokalemia: Secondary | ICD-10-CM | POA: Diagnosis not present

## 2018-08-13 DIAGNOSIS — Z992 Dependence on renal dialysis: Secondary | ICD-10-CM | POA: Diagnosis not present

## 2018-08-13 DIAGNOSIS — N2581 Secondary hyperparathyroidism of renal origin: Secondary | ICD-10-CM | POA: Diagnosis not present

## 2018-08-13 DIAGNOSIS — N186 End stage renal disease: Secondary | ICD-10-CM | POA: Diagnosis not present

## 2018-08-17 DIAGNOSIS — E876 Hypokalemia: Secondary | ICD-10-CM | POA: Diagnosis not present

## 2018-08-17 DIAGNOSIS — D631 Anemia in chronic kidney disease: Secondary | ICD-10-CM | POA: Diagnosis not present

## 2018-08-17 DIAGNOSIS — N2581 Secondary hyperparathyroidism of renal origin: Secondary | ICD-10-CM | POA: Diagnosis not present

## 2018-08-17 DIAGNOSIS — Z992 Dependence on renal dialysis: Secondary | ICD-10-CM | POA: Diagnosis not present

## 2018-08-17 DIAGNOSIS — N186 End stage renal disease: Secondary | ICD-10-CM | POA: Diagnosis not present

## 2018-08-20 DIAGNOSIS — N2581 Secondary hyperparathyroidism of renal origin: Secondary | ICD-10-CM | POA: Diagnosis not present

## 2018-08-20 DIAGNOSIS — Z992 Dependence on renal dialysis: Secondary | ICD-10-CM | POA: Diagnosis not present

## 2018-08-20 DIAGNOSIS — D631 Anemia in chronic kidney disease: Secondary | ICD-10-CM | POA: Diagnosis not present

## 2018-08-20 DIAGNOSIS — E876 Hypokalemia: Secondary | ICD-10-CM | POA: Diagnosis not present

## 2018-08-20 DIAGNOSIS — N186 End stage renal disease: Secondary | ICD-10-CM | POA: Diagnosis not present

## 2018-08-22 DIAGNOSIS — Z992 Dependence on renal dialysis: Secondary | ICD-10-CM | POA: Diagnosis not present

## 2018-08-22 DIAGNOSIS — N186 End stage renal disease: Secondary | ICD-10-CM | POA: Diagnosis not present

## 2018-08-22 DIAGNOSIS — E876 Hypokalemia: Secondary | ICD-10-CM | POA: Diagnosis not present

## 2018-08-22 DIAGNOSIS — D631 Anemia in chronic kidney disease: Secondary | ICD-10-CM | POA: Diagnosis not present

## 2018-08-22 DIAGNOSIS — N2581 Secondary hyperparathyroidism of renal origin: Secondary | ICD-10-CM | POA: Diagnosis not present

## 2018-08-24 DIAGNOSIS — E876 Hypokalemia: Secondary | ICD-10-CM | POA: Diagnosis not present

## 2018-08-24 DIAGNOSIS — D631 Anemia in chronic kidney disease: Secondary | ICD-10-CM | POA: Diagnosis not present

## 2018-08-24 DIAGNOSIS — Z992 Dependence on renal dialysis: Secondary | ICD-10-CM | POA: Diagnosis not present

## 2018-08-24 DIAGNOSIS — N2581 Secondary hyperparathyroidism of renal origin: Secondary | ICD-10-CM | POA: Diagnosis not present

## 2018-08-24 DIAGNOSIS — N186 End stage renal disease: Secondary | ICD-10-CM | POA: Diagnosis not present

## 2018-08-27 DIAGNOSIS — N186 End stage renal disease: Secondary | ICD-10-CM | POA: Diagnosis not present

## 2018-08-27 DIAGNOSIS — E876 Hypokalemia: Secondary | ICD-10-CM | POA: Diagnosis not present

## 2018-08-27 DIAGNOSIS — N2581 Secondary hyperparathyroidism of renal origin: Secondary | ICD-10-CM | POA: Diagnosis not present

## 2018-08-27 DIAGNOSIS — D631 Anemia in chronic kidney disease: Secondary | ICD-10-CM | POA: Diagnosis not present

## 2018-08-27 DIAGNOSIS — Z992 Dependence on renal dialysis: Secondary | ICD-10-CM | POA: Diagnosis not present

## 2018-08-28 DIAGNOSIS — M3214 Glomerular disease in systemic lupus erythematosus: Secondary | ICD-10-CM | POA: Diagnosis not present

## 2018-08-28 DIAGNOSIS — Z992 Dependence on renal dialysis: Secondary | ICD-10-CM | POA: Diagnosis not present

## 2018-08-28 DIAGNOSIS — N186 End stage renal disease: Secondary | ICD-10-CM | POA: Diagnosis not present

## 2018-08-29 DIAGNOSIS — E876 Hypokalemia: Secondary | ICD-10-CM | POA: Diagnosis not present

## 2018-08-29 DIAGNOSIS — Z992 Dependence on renal dialysis: Secondary | ICD-10-CM | POA: Diagnosis not present

## 2018-08-29 DIAGNOSIS — D509 Iron deficiency anemia, unspecified: Secondary | ICD-10-CM | POA: Diagnosis not present

## 2018-08-29 DIAGNOSIS — N2581 Secondary hyperparathyroidism of renal origin: Secondary | ICD-10-CM | POA: Diagnosis not present

## 2018-08-29 DIAGNOSIS — N186 End stage renal disease: Secondary | ICD-10-CM | POA: Diagnosis not present

## 2018-08-29 DIAGNOSIS — D631 Anemia in chronic kidney disease: Secondary | ICD-10-CM | POA: Diagnosis not present

## 2018-08-31 DIAGNOSIS — D509 Iron deficiency anemia, unspecified: Secondary | ICD-10-CM | POA: Diagnosis not present

## 2018-08-31 DIAGNOSIS — N2581 Secondary hyperparathyroidism of renal origin: Secondary | ICD-10-CM | POA: Diagnosis not present

## 2018-08-31 DIAGNOSIS — N186 End stage renal disease: Secondary | ICD-10-CM | POA: Diagnosis not present

## 2018-08-31 DIAGNOSIS — Z992 Dependence on renal dialysis: Secondary | ICD-10-CM | POA: Diagnosis not present

## 2018-08-31 DIAGNOSIS — E876 Hypokalemia: Secondary | ICD-10-CM | POA: Diagnosis not present

## 2018-08-31 DIAGNOSIS — D631 Anemia in chronic kidney disease: Secondary | ICD-10-CM | POA: Diagnosis not present

## 2018-09-03 DIAGNOSIS — D631 Anemia in chronic kidney disease: Secondary | ICD-10-CM | POA: Diagnosis not present

## 2018-09-03 DIAGNOSIS — Z992 Dependence on renal dialysis: Secondary | ICD-10-CM | POA: Diagnosis not present

## 2018-09-03 DIAGNOSIS — N186 End stage renal disease: Secondary | ICD-10-CM | POA: Diagnosis not present

## 2018-09-03 DIAGNOSIS — D509 Iron deficiency anemia, unspecified: Secondary | ICD-10-CM | POA: Diagnosis not present

## 2018-09-03 DIAGNOSIS — E876 Hypokalemia: Secondary | ICD-10-CM | POA: Diagnosis not present

## 2018-09-03 DIAGNOSIS — N2581 Secondary hyperparathyroidism of renal origin: Secondary | ICD-10-CM | POA: Diagnosis not present

## 2018-09-05 DIAGNOSIS — D631 Anemia in chronic kidney disease: Secondary | ICD-10-CM | POA: Diagnosis not present

## 2018-09-05 DIAGNOSIS — N2581 Secondary hyperparathyroidism of renal origin: Secondary | ICD-10-CM | POA: Diagnosis not present

## 2018-09-05 DIAGNOSIS — D509 Iron deficiency anemia, unspecified: Secondary | ICD-10-CM | POA: Diagnosis not present

## 2018-09-05 DIAGNOSIS — N186 End stage renal disease: Secondary | ICD-10-CM | POA: Diagnosis not present

## 2018-09-05 DIAGNOSIS — Z992 Dependence on renal dialysis: Secondary | ICD-10-CM | POA: Diagnosis not present

## 2018-09-05 DIAGNOSIS — E876 Hypokalemia: Secondary | ICD-10-CM | POA: Diagnosis not present

## 2018-09-07 DIAGNOSIS — N2581 Secondary hyperparathyroidism of renal origin: Secondary | ICD-10-CM | POA: Diagnosis not present

## 2018-09-07 DIAGNOSIS — Z992 Dependence on renal dialysis: Secondary | ICD-10-CM | POA: Diagnosis not present

## 2018-09-07 DIAGNOSIS — D631 Anemia in chronic kidney disease: Secondary | ICD-10-CM | POA: Diagnosis not present

## 2018-09-07 DIAGNOSIS — N186 End stage renal disease: Secondary | ICD-10-CM | POA: Diagnosis not present

## 2018-09-07 DIAGNOSIS — E876 Hypokalemia: Secondary | ICD-10-CM | POA: Diagnosis not present

## 2018-09-07 DIAGNOSIS — D509 Iron deficiency anemia, unspecified: Secondary | ICD-10-CM | POA: Diagnosis not present

## 2018-09-10 DIAGNOSIS — D631 Anemia in chronic kidney disease: Secondary | ICD-10-CM | POA: Diagnosis not present

## 2018-09-10 DIAGNOSIS — N2581 Secondary hyperparathyroidism of renal origin: Secondary | ICD-10-CM | POA: Diagnosis not present

## 2018-09-10 DIAGNOSIS — D509 Iron deficiency anemia, unspecified: Secondary | ICD-10-CM | POA: Diagnosis not present

## 2018-09-10 DIAGNOSIS — Z992 Dependence on renal dialysis: Secondary | ICD-10-CM | POA: Diagnosis not present

## 2018-09-10 DIAGNOSIS — N186 End stage renal disease: Secondary | ICD-10-CM | POA: Diagnosis not present

## 2018-09-10 DIAGNOSIS — E876 Hypokalemia: Secondary | ICD-10-CM | POA: Diagnosis not present

## 2018-09-11 DIAGNOSIS — Z794 Long term (current) use of insulin: Secondary | ICD-10-CM | POA: Diagnosis not present

## 2018-09-11 DIAGNOSIS — Z0001 Encounter for general adult medical examination with abnormal findings: Secondary | ICD-10-CM | POA: Diagnosis not present

## 2018-09-11 DIAGNOSIS — Z992 Dependence on renal dialysis: Secondary | ICD-10-CM | POA: Diagnosis not present

## 2018-09-11 DIAGNOSIS — M1990 Unspecified osteoarthritis, unspecified site: Secondary | ICD-10-CM | POA: Diagnosis not present

## 2018-09-11 DIAGNOSIS — E1122 Type 2 diabetes mellitus with diabetic chronic kidney disease: Secondary | ICD-10-CM | POA: Diagnosis not present

## 2018-09-11 DIAGNOSIS — Z0000 Encounter for general adult medical examination without abnormal findings: Secondary | ICD-10-CM | POA: Diagnosis not present

## 2018-09-11 DIAGNOSIS — E663 Overweight: Secondary | ICD-10-CM | POA: Diagnosis not present

## 2018-09-11 DIAGNOSIS — N186 End stage renal disease: Secondary | ICD-10-CM | POA: Diagnosis not present

## 2018-09-11 DIAGNOSIS — I10 Essential (primary) hypertension: Secondary | ICD-10-CM | POA: Diagnosis not present

## 2018-09-11 DIAGNOSIS — G629 Polyneuropathy, unspecified: Secondary | ICD-10-CM | POA: Diagnosis not present

## 2018-09-14 DIAGNOSIS — Z992 Dependence on renal dialysis: Secondary | ICD-10-CM | POA: Diagnosis not present

## 2018-09-14 DIAGNOSIS — E876 Hypokalemia: Secondary | ICD-10-CM | POA: Diagnosis not present

## 2018-09-14 DIAGNOSIS — N2581 Secondary hyperparathyroidism of renal origin: Secondary | ICD-10-CM | POA: Diagnosis not present

## 2018-09-14 DIAGNOSIS — N186 End stage renal disease: Secondary | ICD-10-CM | POA: Diagnosis not present

## 2018-09-14 DIAGNOSIS — D509 Iron deficiency anemia, unspecified: Secondary | ICD-10-CM | POA: Diagnosis not present

## 2018-09-14 DIAGNOSIS — D631 Anemia in chronic kidney disease: Secondary | ICD-10-CM | POA: Diagnosis not present

## 2018-09-17 DIAGNOSIS — N2581 Secondary hyperparathyroidism of renal origin: Secondary | ICD-10-CM | POA: Diagnosis not present

## 2018-09-17 DIAGNOSIS — D509 Iron deficiency anemia, unspecified: Secondary | ICD-10-CM | POA: Diagnosis not present

## 2018-09-17 DIAGNOSIS — E876 Hypokalemia: Secondary | ICD-10-CM | POA: Diagnosis not present

## 2018-09-17 DIAGNOSIS — N186 End stage renal disease: Secondary | ICD-10-CM | POA: Diagnosis not present

## 2018-09-17 DIAGNOSIS — Z992 Dependence on renal dialysis: Secondary | ICD-10-CM | POA: Diagnosis not present

## 2018-09-17 DIAGNOSIS — D631 Anemia in chronic kidney disease: Secondary | ICD-10-CM | POA: Diagnosis not present

## 2018-09-19 DIAGNOSIS — D631 Anemia in chronic kidney disease: Secondary | ICD-10-CM | POA: Diagnosis not present

## 2018-09-19 DIAGNOSIS — E119 Type 2 diabetes mellitus without complications: Secondary | ICD-10-CM | POA: Diagnosis not present

## 2018-09-19 DIAGNOSIS — D509 Iron deficiency anemia, unspecified: Secondary | ICD-10-CM | POA: Diagnosis not present

## 2018-09-19 DIAGNOSIS — N2581 Secondary hyperparathyroidism of renal origin: Secondary | ICD-10-CM | POA: Diagnosis not present

## 2018-09-19 DIAGNOSIS — E876 Hypokalemia: Secondary | ICD-10-CM | POA: Diagnosis not present

## 2018-09-19 DIAGNOSIS — Z992 Dependence on renal dialysis: Secondary | ICD-10-CM | POA: Diagnosis not present

## 2018-09-19 DIAGNOSIS — N186 End stage renal disease: Secondary | ICD-10-CM | POA: Diagnosis not present

## 2018-09-21 DIAGNOSIS — Z992 Dependence on renal dialysis: Secondary | ICD-10-CM | POA: Diagnosis not present

## 2018-09-21 DIAGNOSIS — N2581 Secondary hyperparathyroidism of renal origin: Secondary | ICD-10-CM | POA: Diagnosis not present

## 2018-09-21 DIAGNOSIS — E876 Hypokalemia: Secondary | ICD-10-CM | POA: Diagnosis not present

## 2018-09-21 DIAGNOSIS — D509 Iron deficiency anemia, unspecified: Secondary | ICD-10-CM | POA: Diagnosis not present

## 2018-09-21 DIAGNOSIS — D631 Anemia in chronic kidney disease: Secondary | ICD-10-CM | POA: Diagnosis not present

## 2018-09-21 DIAGNOSIS — N186 End stage renal disease: Secondary | ICD-10-CM | POA: Diagnosis not present

## 2018-09-24 DIAGNOSIS — N186 End stage renal disease: Secondary | ICD-10-CM | POA: Diagnosis not present

## 2018-09-24 DIAGNOSIS — D631 Anemia in chronic kidney disease: Secondary | ICD-10-CM | POA: Diagnosis not present

## 2018-09-24 DIAGNOSIS — H25813 Combined forms of age-related cataract, bilateral: Secondary | ICD-10-CM | POA: Diagnosis not present

## 2018-09-24 DIAGNOSIS — E119 Type 2 diabetes mellitus without complications: Secondary | ICD-10-CM | POA: Diagnosis not present

## 2018-09-24 DIAGNOSIS — H04123 Dry eye syndrome of bilateral lacrimal glands: Secondary | ICD-10-CM | POA: Diagnosis not present

## 2018-09-24 DIAGNOSIS — Z794 Long term (current) use of insulin: Secondary | ICD-10-CM | POA: Diagnosis not present

## 2018-09-24 DIAGNOSIS — N2581 Secondary hyperparathyroidism of renal origin: Secondary | ICD-10-CM | POA: Diagnosis not present

## 2018-09-24 DIAGNOSIS — M3210 Systemic lupus erythematosus, organ or system involvement unspecified: Secondary | ICD-10-CM | POA: Diagnosis not present

## 2018-09-24 DIAGNOSIS — E876 Hypokalemia: Secondary | ICD-10-CM | POA: Diagnosis not present

## 2018-09-24 DIAGNOSIS — H00024 Hordeolum internum left upper eyelid: Secondary | ICD-10-CM | POA: Diagnosis not present

## 2018-09-24 DIAGNOSIS — D509 Iron deficiency anemia, unspecified: Secondary | ICD-10-CM | POA: Diagnosis not present

## 2018-09-24 DIAGNOSIS — Z992 Dependence on renal dialysis: Secondary | ICD-10-CM | POA: Diagnosis not present

## 2018-09-24 DIAGNOSIS — H35033 Hypertensive retinopathy, bilateral: Secondary | ICD-10-CM | POA: Diagnosis not present

## 2018-09-26 DIAGNOSIS — D509 Iron deficiency anemia, unspecified: Secondary | ICD-10-CM | POA: Diagnosis not present

## 2018-09-26 DIAGNOSIS — D631 Anemia in chronic kidney disease: Secondary | ICD-10-CM | POA: Diagnosis not present

## 2018-09-26 DIAGNOSIS — N2581 Secondary hyperparathyroidism of renal origin: Secondary | ICD-10-CM | POA: Diagnosis not present

## 2018-09-26 DIAGNOSIS — E876 Hypokalemia: Secondary | ICD-10-CM | POA: Diagnosis not present

## 2018-09-26 DIAGNOSIS — N186 End stage renal disease: Secondary | ICD-10-CM | POA: Diagnosis not present

## 2018-09-26 DIAGNOSIS — Z992 Dependence on renal dialysis: Secondary | ICD-10-CM | POA: Diagnosis not present

## 2018-09-28 DIAGNOSIS — N186 End stage renal disease: Secondary | ICD-10-CM | POA: Diagnosis not present

## 2018-09-28 DIAGNOSIS — M3214 Glomerular disease in systemic lupus erythematosus: Secondary | ICD-10-CM | POA: Diagnosis not present

## 2018-09-28 DIAGNOSIS — E876 Hypokalemia: Secondary | ICD-10-CM | POA: Diagnosis not present

## 2018-09-28 DIAGNOSIS — Z23 Encounter for immunization: Secondary | ICD-10-CM | POA: Diagnosis not present

## 2018-09-28 DIAGNOSIS — Z992 Dependence on renal dialysis: Secondary | ICD-10-CM | POA: Diagnosis not present

## 2018-09-28 DIAGNOSIS — D631 Anemia in chronic kidney disease: Secondary | ICD-10-CM | POA: Diagnosis not present

## 2018-09-28 DIAGNOSIS — D509 Iron deficiency anemia, unspecified: Secondary | ICD-10-CM | POA: Diagnosis not present

## 2018-09-28 DIAGNOSIS — N2581 Secondary hyperparathyroidism of renal origin: Secondary | ICD-10-CM | POA: Diagnosis not present

## 2018-10-01 DIAGNOSIS — Z992 Dependence on renal dialysis: Secondary | ICD-10-CM | POA: Diagnosis not present

## 2018-10-01 DIAGNOSIS — N186 End stage renal disease: Secondary | ICD-10-CM | POA: Diagnosis not present

## 2018-10-01 DIAGNOSIS — N2581 Secondary hyperparathyroidism of renal origin: Secondary | ICD-10-CM | POA: Diagnosis not present

## 2018-10-01 DIAGNOSIS — D631 Anemia in chronic kidney disease: Secondary | ICD-10-CM | POA: Diagnosis not present

## 2018-10-01 DIAGNOSIS — D509 Iron deficiency anemia, unspecified: Secondary | ICD-10-CM | POA: Diagnosis not present

## 2018-10-01 DIAGNOSIS — E876 Hypokalemia: Secondary | ICD-10-CM | POA: Diagnosis not present

## 2018-10-03 DIAGNOSIS — D631 Anemia in chronic kidney disease: Secondary | ICD-10-CM | POA: Diagnosis not present

## 2018-10-03 DIAGNOSIS — D509 Iron deficiency anemia, unspecified: Secondary | ICD-10-CM | POA: Diagnosis not present

## 2018-10-03 DIAGNOSIS — N186 End stage renal disease: Secondary | ICD-10-CM | POA: Diagnosis not present

## 2018-10-03 DIAGNOSIS — N2581 Secondary hyperparathyroidism of renal origin: Secondary | ICD-10-CM | POA: Diagnosis not present

## 2018-10-03 DIAGNOSIS — Z992 Dependence on renal dialysis: Secondary | ICD-10-CM | POA: Diagnosis not present

## 2018-10-03 DIAGNOSIS — E876 Hypokalemia: Secondary | ICD-10-CM | POA: Diagnosis not present

## 2018-10-05 DIAGNOSIS — N186 End stage renal disease: Secondary | ICD-10-CM | POA: Diagnosis not present

## 2018-10-05 DIAGNOSIS — D509 Iron deficiency anemia, unspecified: Secondary | ICD-10-CM | POA: Diagnosis not present

## 2018-10-05 DIAGNOSIS — Z992 Dependence on renal dialysis: Secondary | ICD-10-CM | POA: Diagnosis not present

## 2018-10-05 DIAGNOSIS — N2581 Secondary hyperparathyroidism of renal origin: Secondary | ICD-10-CM | POA: Diagnosis not present

## 2018-10-05 DIAGNOSIS — D631 Anemia in chronic kidney disease: Secondary | ICD-10-CM | POA: Diagnosis not present

## 2018-10-05 DIAGNOSIS — E876 Hypokalemia: Secondary | ICD-10-CM | POA: Diagnosis not present

## 2018-10-08 DIAGNOSIS — D509 Iron deficiency anemia, unspecified: Secondary | ICD-10-CM | POA: Diagnosis not present

## 2018-10-08 DIAGNOSIS — N2581 Secondary hyperparathyroidism of renal origin: Secondary | ICD-10-CM | POA: Diagnosis not present

## 2018-10-08 DIAGNOSIS — D631 Anemia in chronic kidney disease: Secondary | ICD-10-CM | POA: Diagnosis not present

## 2018-10-08 DIAGNOSIS — Z992 Dependence on renal dialysis: Secondary | ICD-10-CM | POA: Diagnosis not present

## 2018-10-08 DIAGNOSIS — N186 End stage renal disease: Secondary | ICD-10-CM | POA: Diagnosis not present

## 2018-10-08 DIAGNOSIS — E876 Hypokalemia: Secondary | ICD-10-CM | POA: Diagnosis not present

## 2018-10-10 DIAGNOSIS — D509 Iron deficiency anemia, unspecified: Secondary | ICD-10-CM | POA: Diagnosis not present

## 2018-10-10 DIAGNOSIS — E876 Hypokalemia: Secondary | ICD-10-CM | POA: Diagnosis not present

## 2018-10-10 DIAGNOSIS — Z992 Dependence on renal dialysis: Secondary | ICD-10-CM | POA: Diagnosis not present

## 2018-10-10 DIAGNOSIS — N186 End stage renal disease: Secondary | ICD-10-CM | POA: Diagnosis not present

## 2018-10-10 DIAGNOSIS — D631 Anemia in chronic kidney disease: Secondary | ICD-10-CM | POA: Diagnosis not present

## 2018-10-10 DIAGNOSIS — N2581 Secondary hyperparathyroidism of renal origin: Secondary | ICD-10-CM | POA: Diagnosis not present

## 2018-10-12 DIAGNOSIS — E876 Hypokalemia: Secondary | ICD-10-CM | POA: Diagnosis not present

## 2018-10-12 DIAGNOSIS — Z992 Dependence on renal dialysis: Secondary | ICD-10-CM | POA: Diagnosis not present

## 2018-10-12 DIAGNOSIS — N186 End stage renal disease: Secondary | ICD-10-CM | POA: Diagnosis not present

## 2018-10-12 DIAGNOSIS — D631 Anemia in chronic kidney disease: Secondary | ICD-10-CM | POA: Diagnosis not present

## 2018-10-12 DIAGNOSIS — N2581 Secondary hyperparathyroidism of renal origin: Secondary | ICD-10-CM | POA: Diagnosis not present

## 2018-10-12 DIAGNOSIS — D509 Iron deficiency anemia, unspecified: Secondary | ICD-10-CM | POA: Diagnosis not present

## 2018-10-15 DIAGNOSIS — D509 Iron deficiency anemia, unspecified: Secondary | ICD-10-CM | POA: Diagnosis not present

## 2018-10-15 DIAGNOSIS — N2581 Secondary hyperparathyroidism of renal origin: Secondary | ICD-10-CM | POA: Diagnosis not present

## 2018-10-15 DIAGNOSIS — D631 Anemia in chronic kidney disease: Secondary | ICD-10-CM | POA: Diagnosis not present

## 2018-10-15 DIAGNOSIS — E876 Hypokalemia: Secondary | ICD-10-CM | POA: Diagnosis not present

## 2018-10-15 DIAGNOSIS — N186 End stage renal disease: Secondary | ICD-10-CM | POA: Diagnosis not present

## 2018-10-15 DIAGNOSIS — Z992 Dependence on renal dialysis: Secondary | ICD-10-CM | POA: Diagnosis not present

## 2018-10-17 DIAGNOSIS — Z992 Dependence on renal dialysis: Secondary | ICD-10-CM | POA: Diagnosis not present

## 2018-10-17 DIAGNOSIS — N2581 Secondary hyperparathyroidism of renal origin: Secondary | ICD-10-CM | POA: Diagnosis not present

## 2018-10-17 DIAGNOSIS — E876 Hypokalemia: Secondary | ICD-10-CM | POA: Diagnosis not present

## 2018-10-17 DIAGNOSIS — D631 Anemia in chronic kidney disease: Secondary | ICD-10-CM | POA: Diagnosis not present

## 2018-10-17 DIAGNOSIS — N186 End stage renal disease: Secondary | ICD-10-CM | POA: Diagnosis not present

## 2018-10-17 DIAGNOSIS — D509 Iron deficiency anemia, unspecified: Secondary | ICD-10-CM | POA: Diagnosis not present

## 2018-10-19 DIAGNOSIS — N2581 Secondary hyperparathyroidism of renal origin: Secondary | ICD-10-CM | POA: Diagnosis not present

## 2018-10-19 DIAGNOSIS — E876 Hypokalemia: Secondary | ICD-10-CM | POA: Diagnosis not present

## 2018-10-19 DIAGNOSIS — D631 Anemia in chronic kidney disease: Secondary | ICD-10-CM | POA: Diagnosis not present

## 2018-10-19 DIAGNOSIS — Z992 Dependence on renal dialysis: Secondary | ICD-10-CM | POA: Diagnosis not present

## 2018-10-19 DIAGNOSIS — D509 Iron deficiency anemia, unspecified: Secondary | ICD-10-CM | POA: Diagnosis not present

## 2018-10-19 DIAGNOSIS — N186 End stage renal disease: Secondary | ICD-10-CM | POA: Diagnosis not present

## 2018-10-22 DIAGNOSIS — N2581 Secondary hyperparathyroidism of renal origin: Secondary | ICD-10-CM | POA: Diagnosis not present

## 2018-10-22 DIAGNOSIS — N186 End stage renal disease: Secondary | ICD-10-CM | POA: Diagnosis not present

## 2018-10-22 DIAGNOSIS — Z992 Dependence on renal dialysis: Secondary | ICD-10-CM | POA: Diagnosis not present

## 2018-10-22 DIAGNOSIS — D631 Anemia in chronic kidney disease: Secondary | ICD-10-CM | POA: Diagnosis not present

## 2018-10-22 DIAGNOSIS — E876 Hypokalemia: Secondary | ICD-10-CM | POA: Diagnosis not present

## 2018-10-22 DIAGNOSIS — D509 Iron deficiency anemia, unspecified: Secondary | ICD-10-CM | POA: Diagnosis not present

## 2018-10-24 DIAGNOSIS — N2581 Secondary hyperparathyroidism of renal origin: Secondary | ICD-10-CM | POA: Diagnosis not present

## 2018-10-24 DIAGNOSIS — Z992 Dependence on renal dialysis: Secondary | ICD-10-CM | POA: Diagnosis not present

## 2018-10-24 DIAGNOSIS — D509 Iron deficiency anemia, unspecified: Secondary | ICD-10-CM | POA: Diagnosis not present

## 2018-10-24 DIAGNOSIS — N186 End stage renal disease: Secondary | ICD-10-CM | POA: Diagnosis not present

## 2018-10-24 DIAGNOSIS — D631 Anemia in chronic kidney disease: Secondary | ICD-10-CM | POA: Diagnosis not present

## 2018-10-24 DIAGNOSIS — E876 Hypokalemia: Secondary | ICD-10-CM | POA: Diagnosis not present

## 2018-10-26 DIAGNOSIS — D509 Iron deficiency anemia, unspecified: Secondary | ICD-10-CM | POA: Diagnosis not present

## 2018-10-26 DIAGNOSIS — N2581 Secondary hyperparathyroidism of renal origin: Secondary | ICD-10-CM | POA: Diagnosis not present

## 2018-10-26 DIAGNOSIS — Z992 Dependence on renal dialysis: Secondary | ICD-10-CM | POA: Diagnosis not present

## 2018-10-26 DIAGNOSIS — N186 End stage renal disease: Secondary | ICD-10-CM | POA: Diagnosis not present

## 2018-10-26 DIAGNOSIS — D631 Anemia in chronic kidney disease: Secondary | ICD-10-CM | POA: Diagnosis not present

## 2018-10-26 DIAGNOSIS — E876 Hypokalemia: Secondary | ICD-10-CM | POA: Diagnosis not present

## 2018-10-29 DIAGNOSIS — E876 Hypokalemia: Secondary | ICD-10-CM | POA: Diagnosis not present

## 2018-10-29 DIAGNOSIS — N186 End stage renal disease: Secondary | ICD-10-CM | POA: Diagnosis not present

## 2018-10-29 DIAGNOSIS — Z23 Encounter for immunization: Secondary | ICD-10-CM | POA: Diagnosis not present

## 2018-10-29 DIAGNOSIS — D631 Anemia in chronic kidney disease: Secondary | ICD-10-CM | POA: Diagnosis not present

## 2018-10-29 DIAGNOSIS — N2581 Secondary hyperparathyroidism of renal origin: Secondary | ICD-10-CM | POA: Diagnosis not present

## 2018-10-29 DIAGNOSIS — Z992 Dependence on renal dialysis: Secondary | ICD-10-CM | POA: Diagnosis not present

## 2018-10-29 DIAGNOSIS — D509 Iron deficiency anemia, unspecified: Secondary | ICD-10-CM | POA: Diagnosis not present

## 2018-10-29 DIAGNOSIS — M3214 Glomerular disease in systemic lupus erythematosus: Secondary | ICD-10-CM | POA: Diagnosis not present

## 2018-10-31 DIAGNOSIS — N186 End stage renal disease: Secondary | ICD-10-CM | POA: Diagnosis not present

## 2018-10-31 DIAGNOSIS — D631 Anemia in chronic kidney disease: Secondary | ICD-10-CM | POA: Diagnosis not present

## 2018-10-31 DIAGNOSIS — E876 Hypokalemia: Secondary | ICD-10-CM | POA: Diagnosis not present

## 2018-10-31 DIAGNOSIS — Z992 Dependence on renal dialysis: Secondary | ICD-10-CM | POA: Diagnosis not present

## 2018-10-31 DIAGNOSIS — D509 Iron deficiency anemia, unspecified: Secondary | ICD-10-CM | POA: Diagnosis not present

## 2018-10-31 DIAGNOSIS — N2581 Secondary hyperparathyroidism of renal origin: Secondary | ICD-10-CM | POA: Diagnosis not present

## 2018-11-02 DIAGNOSIS — N2581 Secondary hyperparathyroidism of renal origin: Secondary | ICD-10-CM | POA: Diagnosis not present

## 2018-11-02 DIAGNOSIS — D631 Anemia in chronic kidney disease: Secondary | ICD-10-CM | POA: Diagnosis not present

## 2018-11-02 DIAGNOSIS — Z992 Dependence on renal dialysis: Secondary | ICD-10-CM | POA: Diagnosis not present

## 2018-11-02 DIAGNOSIS — D509 Iron deficiency anemia, unspecified: Secondary | ICD-10-CM | POA: Diagnosis not present

## 2018-11-02 DIAGNOSIS — E876 Hypokalemia: Secondary | ICD-10-CM | POA: Diagnosis not present

## 2018-11-02 DIAGNOSIS — N186 End stage renal disease: Secondary | ICD-10-CM | POA: Diagnosis not present

## 2018-11-05 DIAGNOSIS — D631 Anemia in chronic kidney disease: Secondary | ICD-10-CM | POA: Diagnosis not present

## 2018-11-05 DIAGNOSIS — N2581 Secondary hyperparathyroidism of renal origin: Secondary | ICD-10-CM | POA: Diagnosis not present

## 2018-11-05 DIAGNOSIS — Z992 Dependence on renal dialysis: Secondary | ICD-10-CM | POA: Diagnosis not present

## 2018-11-05 DIAGNOSIS — D509 Iron deficiency anemia, unspecified: Secondary | ICD-10-CM | POA: Diagnosis not present

## 2018-11-05 DIAGNOSIS — N186 End stage renal disease: Secondary | ICD-10-CM | POA: Diagnosis not present

## 2018-11-05 DIAGNOSIS — E876 Hypokalemia: Secondary | ICD-10-CM | POA: Diagnosis not present

## 2018-11-07 DIAGNOSIS — N2581 Secondary hyperparathyroidism of renal origin: Secondary | ICD-10-CM | POA: Diagnosis not present

## 2018-11-07 DIAGNOSIS — D631 Anemia in chronic kidney disease: Secondary | ICD-10-CM | POA: Diagnosis not present

## 2018-11-07 DIAGNOSIS — N186 End stage renal disease: Secondary | ICD-10-CM | POA: Diagnosis not present

## 2018-11-07 DIAGNOSIS — Z992 Dependence on renal dialysis: Secondary | ICD-10-CM | POA: Diagnosis not present

## 2018-11-07 DIAGNOSIS — D509 Iron deficiency anemia, unspecified: Secondary | ICD-10-CM | POA: Diagnosis not present

## 2018-11-07 DIAGNOSIS — E876 Hypokalemia: Secondary | ICD-10-CM | POA: Diagnosis not present

## 2018-11-09 DIAGNOSIS — D631 Anemia in chronic kidney disease: Secondary | ICD-10-CM | POA: Diagnosis not present

## 2018-11-09 DIAGNOSIS — D509 Iron deficiency anemia, unspecified: Secondary | ICD-10-CM | POA: Diagnosis not present

## 2018-11-09 DIAGNOSIS — E876 Hypokalemia: Secondary | ICD-10-CM | POA: Diagnosis not present

## 2018-11-09 DIAGNOSIS — N186 End stage renal disease: Secondary | ICD-10-CM | POA: Diagnosis not present

## 2018-11-09 DIAGNOSIS — Z992 Dependence on renal dialysis: Secondary | ICD-10-CM | POA: Diagnosis not present

## 2018-11-09 DIAGNOSIS — N2581 Secondary hyperparathyroidism of renal origin: Secondary | ICD-10-CM | POA: Diagnosis not present

## 2018-11-12 DIAGNOSIS — E876 Hypokalemia: Secondary | ICD-10-CM | POA: Diagnosis not present

## 2018-11-12 DIAGNOSIS — D509 Iron deficiency anemia, unspecified: Secondary | ICD-10-CM | POA: Diagnosis not present

## 2018-11-12 DIAGNOSIS — D631 Anemia in chronic kidney disease: Secondary | ICD-10-CM | POA: Diagnosis not present

## 2018-11-12 DIAGNOSIS — N2581 Secondary hyperparathyroidism of renal origin: Secondary | ICD-10-CM | POA: Diagnosis not present

## 2018-11-12 DIAGNOSIS — Z992 Dependence on renal dialysis: Secondary | ICD-10-CM | POA: Diagnosis not present

## 2018-11-12 DIAGNOSIS — N186 End stage renal disease: Secondary | ICD-10-CM | POA: Diagnosis not present

## 2018-11-14 DIAGNOSIS — D631 Anemia in chronic kidney disease: Secondary | ICD-10-CM | POA: Diagnosis not present

## 2018-11-14 DIAGNOSIS — D509 Iron deficiency anemia, unspecified: Secondary | ICD-10-CM | POA: Diagnosis not present

## 2018-11-14 DIAGNOSIS — N2581 Secondary hyperparathyroidism of renal origin: Secondary | ICD-10-CM | POA: Diagnosis not present

## 2018-11-14 DIAGNOSIS — Z992 Dependence on renal dialysis: Secondary | ICD-10-CM | POA: Diagnosis not present

## 2018-11-14 DIAGNOSIS — E876 Hypokalemia: Secondary | ICD-10-CM | POA: Diagnosis not present

## 2018-11-14 DIAGNOSIS — N186 End stage renal disease: Secondary | ICD-10-CM | POA: Diagnosis not present

## 2018-11-16 DIAGNOSIS — D631 Anemia in chronic kidney disease: Secondary | ICD-10-CM | POA: Diagnosis not present

## 2018-11-16 DIAGNOSIS — Z992 Dependence on renal dialysis: Secondary | ICD-10-CM | POA: Diagnosis not present

## 2018-11-16 DIAGNOSIS — N186 End stage renal disease: Secondary | ICD-10-CM | POA: Diagnosis not present

## 2018-11-16 DIAGNOSIS — D509 Iron deficiency anemia, unspecified: Secondary | ICD-10-CM | POA: Diagnosis not present

## 2018-11-16 DIAGNOSIS — N2581 Secondary hyperparathyroidism of renal origin: Secondary | ICD-10-CM | POA: Diagnosis not present

## 2018-11-16 DIAGNOSIS — E876 Hypokalemia: Secondary | ICD-10-CM | POA: Diagnosis not present

## 2018-11-18 ENCOUNTER — Telehealth: Payer: Self-pay | Admitting: Cardiology

## 2018-11-18 ENCOUNTER — Encounter: Payer: Self-pay | Admitting: Cardiology

## 2018-11-18 ENCOUNTER — Ambulatory Visit (INDEPENDENT_AMBULATORY_CARE_PROVIDER_SITE_OTHER): Payer: Medicare Other | Admitting: Cardiology

## 2018-11-18 ENCOUNTER — Other Ambulatory Visit: Payer: Self-pay

## 2018-11-18 VITALS — BP 138/76 | HR 76 | Ht 61.0 in | Wt 157.4 lb

## 2018-11-18 DIAGNOSIS — N186 End stage renal disease: Secondary | ICD-10-CM | POA: Diagnosis not present

## 2018-11-18 DIAGNOSIS — I25119 Atherosclerotic heart disease of native coronary artery with unspecified angina pectoris: Secondary | ICD-10-CM

## 2018-11-18 DIAGNOSIS — Z992 Dependence on renal dialysis: Secondary | ICD-10-CM

## 2018-11-18 DIAGNOSIS — I10 Essential (primary) hypertension: Secondary | ICD-10-CM

## 2018-11-18 DIAGNOSIS — E119 Type 2 diabetes mellitus without complications: Secondary | ICD-10-CM | POA: Diagnosis not present

## 2018-11-18 DIAGNOSIS — I200 Unstable angina: Secondary | ICD-10-CM

## 2018-11-18 DIAGNOSIS — Z794 Long term (current) use of insulin: Secondary | ICD-10-CM | POA: Diagnosis not present

## 2018-11-18 DIAGNOSIS — E785 Hyperlipidemia, unspecified: Secondary | ICD-10-CM | POA: Diagnosis not present

## 2018-11-18 MED ORDER — ISOSORBIDE MONONITRATE ER 30 MG PO TB24: 30.0000 mg | ORAL_TABLET | Freq: Every day | ORAL | 3 refills | Status: DC

## 2018-11-18 NOTE — Telephone Encounter (Signed)
Per discussion with Dr. Saunders Revel, who performed her intervention in February, "I agree with pushing meds as much as possible. Her RCA is quite small (not ideal for atherectomy), and all of her coronaries are quite calcified. I wouldn't be surprised if some of her other disease has progressed as well and that she is on her way need needing CABG (not ideal in such a young patient with lots of other comorbidities). We can certainly recath her if symptoms are refractory to aggressive antianginal therapy, though."

## 2018-11-18 NOTE — Telephone Encounter (Signed)
I discussed the patient's complaint of increased angina and feeling like she would need her other blockage to be intervened on with Dr. Acie Fredrickson.  He recommended to schedule the patient for a cardiac catheterization with possible orbital atherectomy with Dr. Saunders Revel.  I called the patient to discuss possible cardiac catheterization and she states that she does not want to proceed at this time.  She wants to wait and see how she does on the increase in Imdur.  If her symptoms do not improve she will call us back to schedule the cath.  I reviewed signs of unstable angina and when to call us or 911.

## 2018-11-18 NOTE — Patient Instructions (Addendum)
Medication Instructions:  INCREASE: Isosorbide to 30 mg once a day    If you need a refill on your cardiac medications before your next appointment, please call your pharmacy.   Lab work: None   If you have labs (blood work) drawn today and your tests are completely normal, you will receive your results only by: Marland Kitchen MyChart Message (if you have MyChart) OR . A paper copy in the mail If you have any lab test that is abnormal or we need to change your treatment, we will call you to review the results.  Testing/Procedures: None   Follow-Up: At [facility], you and your health needs are our priority.  As part of our continuing mission to provide you with exceptional heart care, we have created designated Provider Care Teams.  These Care Teams include your primary Cardiologist (physician) and Advanced Practice Providers (APPs -  Physician Assistants and Nurse Practitioners) who all work together to provide you with the care you need, when you need it. You will need a follow up appointment in:  3 months.  Please call our office 2 months in advance to schedule this appointment.  You may see Mertie Moores, MD or one of the following Advanced Practice Providers on your designated Care Team: Richardson Dopp, PA-C [place], [place] . Daune Perch, NP  Any Other Special Instructions Will Be Listed Below (If Applicable).   Lifestyle Modifications to Prevent and Treat Heart Disease -Recommend heart healthy/Mediterranean diet, with whole grains, fruits, vegetables, fish, lean meats, nuts, olive oil and avocado oil.  -Limit salt intake to less than 2000 mg per day.  -Recommend moderate walking, starting slowly with a few minutes and working up to 3-5 times/week for 30-50 minutes each session. Aim for at least 150 minutes.week. Goal should be pace of 3 miles/hours, or walking 1.5 miles in 30 minutes -Recommend avoidance of tobacco products. Avoid excess alcohol. -Keep blood pressure well controlled,  ideally less than 130/80.

## 2018-11-18 NOTE — Progress Notes (Signed)
Cardiology Office Note:    Date:  11/18/2018   ID:  Cabella Kimm, DOB 10/21/1970, MRN [id number]  PCP:  Benito Mccreedy, MD  Cardiologist:  Mertie Moores, MD  Referring MD: Benito Mccreedy, MD   Chief Complaint  Patient presents with  . Follow-up  . Coronary Artery Disease    with angina    History of Present Illness:    Paula Bass is a 48 y.o. female with a past medical history significant for lupus, ESRD on HD, HTN, DM, GI bleed. She has history of CAD with prior cardiac stent in [place] in 2017, recent PCI/DES to left circumflex in 03/2018.  With LHC in 2/202 she was noted to have a patent stent to the prox RCA but 90% stenosis of the mid RCA, being treated medically.  It was noted that if she has recurrent angina, we could consider PCI of the RCA with orbital atherectomy.  The patient was seen in follow-up in March with some continued episodes of angina.  Her metoprolol was increased to 25 mg daily and isosorbide mononitrate was added.  At her most recent appointment on 06/13/2018 with Dr. Acie Fredrickson her chest pain had improved.  In April our office received a request for medical clearance for renal transplant from [facility] in [place].  There no request to hold medications.  Dr. Acie Fredrickson noted that ideally the patient should continue dual antiplatelet therapy for 12 months.  He suggested not to do any elective surgical procedures until February 2021.  Since her dialysis was going well he recommends to defer surgery however if continuing dialysis treatment would become not feasible, he would discuss the need for earlier surgery with her nephrologist. She is currently in the process of getting established with the transplant team here in [place].   The patient is here today for 23-month follow-up. Pt was feeling good after her stent in February, however she is now feeling tired, occ short of breath and chest pressure with exertion. This is limiting  her activity, can't walk much. She has chest pain about 3-4 times per week, improves with rest, lasts a few minutes at a time. She gets chest pain and shortness of breath if they take off too much fluid with dialysis. She is limited to 2 liters? of fluid removal.  Her blood pressure runs high prior to dialysis but improves with dialysis.  She denies significant hypotension with dialysis.  She denies orthopnea, PND or edema.    Past Medical History:  Diagnosis Date  . CAD (coronary artery disease) 2016  . CAD (coronary artery disease) 04/19/2018   Hx of PCI to [facility] in [place] in 2017 // [place] 03/2018: dLCx 67, OM1 40, OM2 50, pRCA stent ok, mid RCA 90, EF 55-65, PCI: DES to dLCx  . Diabetes mellitus without complication ([place])   . Dialysis patient ([place])   . Diverticulitis   . GI bleeding   . Hypertension   . Lupus ([place])   . Renal disorder    dialysis    Past Surgical History:  Procedure Laterality Date  . CARDIAC SURGERY     cardiac shunt  . CORONARY STENT INTERVENTION N/A 04/19/2018   Procedure: CORONARY STENT INTERVENTION;  Surgeon: [name], MD;  Location: [place] [facility];  Service: Cardiovascular;  Laterality: N/A;  . Kidney graft Left   . LEFT HEART CATH AND CORONARY ANGIOGRAPHY N/A 04/19/2018   Procedure: LEFT HEART CATH AND CORONARY ANGIOGRAPHY;  Surgeon: [name], MD;  Location: [place] [facility];  Service: Cardiovascular;  Laterality: N/A;    Current Medications: Current Meds  Medication Sig  . amLODipine (NORVASC) 10 MG tablet Take 10 mg by mouth daily.  Marland Kitchen aspirin EC 81 MG tablet Take 1 tablet (81 mg total) by mouth daily.  Marland Kitchen atorvastatin (LIPITOR) 40 MG tablet Take 40 mg by mouth daily.   . clopidogrel (PLAVIX) 75 MG tablet Take 1 tablet (75 mg total) by mouth daily.  . insulin lispro (HUMALOG) 100 UNIT/ML injection Inject 30-40 Units into the skin See admin instructions. Inject 40 units into the skin before breakfast and 30 units at bedtime   . isosorbide  mononitrate (IMDUR) 30 MG 24 hr tablet Take 1 tablet (30 mg total) by mouth daily.  Marland Kitchen lidocaine-prilocaine (EMLA) cream Apply 1 application topically Every Tuesday,Thursday,and Saturday with dialysis.   Marland Kitchen metoprolol tartrate (LOPRESSOR) 25 MG tablet Take 1 tablet (25 mg total) by mouth 2 (two) times daily.  . nitroGLYCERIN (NITROSTAT) 0.4 MG SL tablet Place 1 tablet (0.4 mg total) under the tongue every 5 (five) minutes as needed.  . pantoprazole (PROTONIX) 40 MG tablet Take 1 tablet (40 mg total) by mouth daily.  Vladimir Faster Glycol-Propyl Glycol (LUBRICANT EYE DROPS) 0.4-0.3 % SOLN Place 1 drop into both eyes 3 (three) times daily as needed (dry/irritated eyes.).  Marland Kitchen predniSONE (DELTASONE) 10 MG tablet Take 10 mg by mouth daily.   . [DISCONTINUED] isosorbide mononitrate (IMDUR) 30 MG 24 hr tablet Take 0.5 tablets (15 mg total) by mouth daily for 90 doses.     Allergies:   Penicillins   Social History   Socioeconomic History  . Marital status: Single    Spouse name: Not on file  . Number of children: 1  . Years of education: Not on file  . Highest education level: Not on file  Occupational History  . Occupation: disabled  Social Needs  . Financial resource strain: Not on file  . Food insecurity    Worry: Not on file    Inability: Not on file  . Transportation needs    Medical: Not on file    Non-medical: Not on file  Tobacco Use  . Smoking status: Never Smoker  . Smokeless tobacco: Never Used  Substance and Sexual Activity  . Alcohol use: Never    Frequency: Never  . Drug use: Never  . Sexual activity: Yes    Partners: Male    Birth control/protection: Condom  Lifestyle  . Physical activity    Days per week: Not on file    Minutes per session: Not on file  . Stress: Not on file  Relationships  . Social Herbalist on phone: Not on file    Gets together: Not on file    Attends religious service: Not on file    Active member of club or organization: Not on file     Attends meetings of clubs or organizations: Not on file    Relationship status: Not on file  Other Topics Concern  . Not on file  Social History Narrative  . Not on file     Family History: The patient's family history includes Healthy in her brother and brother; Hypertension in her mother; Other in her father. There is no history of Colon cancer, Esophageal cancer, or Breast cancer. ROS:   Please see the history of present illness.     All other systems reviewed and are negative.  EKGs/Labs/Other Studies Reviewed:  The following studies were reviewed today:  CORONARY STENT INTERVENTION  LEFT HEART CATH AND CORONARY ANGIOGRAPHY  04/19/2018   Conclusions: 1. Significant two-vessel CAD, with 80-90% mid/distal LCx and 90% mid RCA stenoses. 2. Moderate, non-obstructive LAD and proximal LCx/OM disease. 3. Patent stent in proximal RCA. 4. Normal left ventricular systolic function and filling pressure. 5. Successful PCI to mid/distal LCx using Synergy 2.25 x 20 mm DES with 0% residual stenosis and TIMI-3 flow.  Recommendations: 1. Overnight extended recovery; anticipate d/c home tomorrow AM with HD at outpatient center tomorrow PM. 2. Continue dual antiplatelet therapy with aspirin and clopidogrel for at least 12 months, ideally longer. 3. Medical therapy of LAD and RCA disease. If she has recurrent CP, PCI to mid RCA with orbital atherectomy will need to be considered.   Recommend dual antiplatelet therapy with Aspirin 81mg  daily and Clopidogrel 75mg  daily long-term (beyond 12 months) because of Multivessel CAD and PCI..   EKG:  EKG is not ordered today.   Recent Labs: 04/15/2018: ALT 9 04/22/2018: BUN 45; Creatinine, Ser 8.49; Hemoglobin 9.6; Platelets 148; Potassium 4.4; Sodium 135   Recent Lipid Panel    Component Value Date/Time   CHOL 120 04/15/2018 0948   TRIG 92 04/15/2018 0948   HDL 59 04/15/2018 0948   CHOLHDL 2.0 04/15/2018 0948   LDLCALC 43 04/15/2018 0948     Physical Exam:    VS:  BP 138/76   Pulse 76   Ht 5\' 1"  (1.549 m)   Wt 157 lb 6.4 oz (71.4 kg)   SpO2 99%   BMI 29.74 kg/m     Wt Readings from Last 3 Encounters:  11/18/18 157 lb 6.4 oz (71.4 kg)  06/13/18 145 lb (65.8 kg)  05/08/18 146 lb (66.2 kg)     Physical Exam  Constitutional: She is oriented to person, place, and time. She appears well-developed and well-nourished. No distress.  HENT:  Head: Normocephalic and atraumatic.  Neck: Normal range of motion. Neck supple. No JVD present.  Cardiovascular: Normal rate, regular rhythm, normal heart sounds and intact distal pulses. Exam reveals no gallop and no friction rub.  No murmur heard. Pulmonary/Chest: Effort normal and breath sounds normal. No respiratory distress. She has no wheezes. She has no rales.  Abdominal: Soft. Bowel sounds are normal.  Musculoskeletal:        General: No edema.     Comments: Presence of left arm AV fistula  Neurological: She is alert and oriented to person, place, and time.  Skin: Skin is warm and dry.  Psychiatric: She has a normal mood and affect. Her behavior is normal. Judgment and thought content normal.  Vitals reviewed.    ASSESSMENT:    1. Coronary artery disease involving native coronary artery of native heart with angina pectoris ([place])   2. Essential (primary) hypertension   3. Hyperlipidemia, unspecified hyperlipidemia type   4. ESRD on hemodialysis ([place])   5. Type 2 diabetes mellitus without complication, with long-term current use of insulin (H[facility])    PLAN:    In order of problems listed above:  CAD -Prior RCA stent in [place].  Patient with angina evaluated by heart cath in 03/2018.  She underwent DES to the left circumflex.  She was also noted to have 90% stenosis of the distal RCA with patent proximal RCA stent. -Patient continues on aspirin, statin, beta-blocker and Imdur. -Patient continues to have intermittent chest pain and shortness of breath, occurring with  exertion and improving with rest.  It is somewhat limiting her activity levels.  The patient feels like at some point she will need to have her other blockage intervened on.  For now I will increase her Imdur from 15 mg to 30 mg daily.  I will confer with Dr. Acie Fredrickson.  Hypertension -On amlodipine 10 mg daily, metoprolol and Imdur -Blood pressure is well controlled.  Hyperlipidemia, Goal LDL<70 -On atorvastatin 40 mg daily -LDL was 43 in 03/2018, at goal.  Continue current therapy.  ESRD on dialysis T/Th/Sat -Patient is being considered for renal transplant, established in [place].  Patient is trying to get established for possible transplant in [place]. Dr. Acie Fredrickson advised to hold off on elective surgery until 12 months on dual antiplatelet therapy.  Diabetes type 2 on insulin -A1c in 11/2017 was 7.0.  Adequate control.  Advised to target A1c less than 7.   Medication Adjustments/Labs and Tests Ordered: Current medicines are reviewed at length with the patient today.  Concerns regarding medicines are outlined above. Labs and tests ordered and medication changes are outlined in the patient instructions below:  Patient Instructions  Medication Instructions:  INCREASE: Isosorbide to 30 mg once a day    If you need a refill on your cardiac medications before your next appointment, please call your pharmacy.   Lab work: None   If you have labs (blood work) drawn today and your tests are completely normal, you will receive your results only by: Marland Kitchen MyChart Message (if you have MyChart) OR . A paper copy in the mail If you have any lab test that is abnormal or we need to change your treatment, we will call you to review the results.  Testing/Procedures: None   Follow-Up: At [facility], you and your health needs are our priority.  As part of our continuing mission to provide you with exceptional heart care, we have created designated Provider Care Teams.  These Care Teams  include your primary Cardiologist (physician) and Advanced Practice Providers (APPs -  Physician Assistants and Nurse Practitioners) who all work together to provide you with the care you need, when you need it. You will need a follow up appointment in:  3 months.  Please call our office 2 months in advance to schedule this appointment.  You may see Mertie Moores, MD or one of the following Advanced Practice Providers on your designated Care Team: Richardson Dopp, PA-C [place], [place] . Daune Perch, NP  Any Other Special Instructions Will Be Listed Below (If Applicable).   Lifestyle Modifications to Prevent and Treat Heart Disease -Recommend heart healthy/Mediterranean diet, with whole grains, fruits, vegetables, fish, lean meats, nuts, olive oil and avocado oil.  -Limit salt intake to less than 2000 mg per day.  -Recommend moderate walking, starting slowly with a few minutes and working up to 3-5 times/week for 30-50 minutes each session. Aim for at least 150 minutes.week. Goal should be pace of 3 miles/hours, or walking 1.5 miles in 30 minutes -Recommend avoidance of tobacco products. Avoid excess alcohol. -Keep blood pressure well controlled, ideally less than 130/80.      Signed, Daune Perch, NP  11/18/2018 4:49 PM    [facility]

## 2018-11-19 DIAGNOSIS — D509 Iron deficiency anemia, unspecified: Secondary | ICD-10-CM | POA: Diagnosis not present

## 2018-11-19 DIAGNOSIS — D631 Anemia in chronic kidney disease: Secondary | ICD-10-CM | POA: Diagnosis not present

## 2018-11-19 DIAGNOSIS — Z992 Dependence on renal dialysis: Secondary | ICD-10-CM | POA: Diagnosis not present

## 2018-11-19 DIAGNOSIS — E876 Hypokalemia: Secondary | ICD-10-CM | POA: Diagnosis not present

## 2018-11-19 DIAGNOSIS — N2581 Secondary hyperparathyroidism of renal origin: Secondary | ICD-10-CM | POA: Diagnosis not present

## 2018-11-19 DIAGNOSIS — N186 End stage renal disease: Secondary | ICD-10-CM | POA: Diagnosis not present

## 2018-11-20 ENCOUNTER — Other Ambulatory Visit: Payer: Self-pay

## 2018-11-20 MED ORDER — PANTOPRAZOLE SODIUM 40 MG PO TBEC: 40.0000 mg | DELAYED_RELEASE_TABLET | Freq: Every day | ORAL | 2 refills | Status: DC

## 2018-11-21 DIAGNOSIS — E876 Hypokalemia: Secondary | ICD-10-CM | POA: Diagnosis not present

## 2018-11-21 DIAGNOSIS — N186 End stage renal disease: Secondary | ICD-10-CM | POA: Diagnosis not present

## 2018-11-21 DIAGNOSIS — D631 Anemia in chronic kidney disease: Secondary | ICD-10-CM | POA: Diagnosis not present

## 2018-11-21 DIAGNOSIS — D509 Iron deficiency anemia, unspecified: Secondary | ICD-10-CM | POA: Diagnosis not present

## 2018-11-21 DIAGNOSIS — Z992 Dependence on renal dialysis: Secondary | ICD-10-CM | POA: Diagnosis not present

## 2018-11-21 DIAGNOSIS — N2581 Secondary hyperparathyroidism of renal origin: Secondary | ICD-10-CM | POA: Diagnosis not present

## 2018-11-23 DIAGNOSIS — D631 Anemia in chronic kidney disease: Secondary | ICD-10-CM | POA: Diagnosis not present

## 2018-11-23 DIAGNOSIS — D509 Iron deficiency anemia, unspecified: Secondary | ICD-10-CM | POA: Diagnosis not present

## 2018-11-23 DIAGNOSIS — N2581 Secondary hyperparathyroidism of renal origin: Secondary | ICD-10-CM | POA: Diagnosis not present

## 2018-11-23 DIAGNOSIS — E876 Hypokalemia: Secondary | ICD-10-CM | POA: Diagnosis not present

## 2018-11-23 DIAGNOSIS — Z992 Dependence on renal dialysis: Secondary | ICD-10-CM | POA: Diagnosis not present

## 2018-11-23 DIAGNOSIS — N186 End stage renal disease: Secondary | ICD-10-CM | POA: Diagnosis not present

## 2018-11-26 DIAGNOSIS — D631 Anemia in chronic kidney disease: Secondary | ICD-10-CM | POA: Diagnosis not present

## 2018-11-26 DIAGNOSIS — D509 Iron deficiency anemia, unspecified: Secondary | ICD-10-CM | POA: Diagnosis not present

## 2018-11-26 DIAGNOSIS — N2581 Secondary hyperparathyroidism of renal origin: Secondary | ICD-10-CM | POA: Diagnosis not present

## 2018-11-26 DIAGNOSIS — Z992 Dependence on renal dialysis: Secondary | ICD-10-CM | POA: Diagnosis not present

## 2018-11-26 DIAGNOSIS — N186 End stage renal disease: Secondary | ICD-10-CM | POA: Diagnosis not present

## 2018-11-26 DIAGNOSIS — E876 Hypokalemia: Secondary | ICD-10-CM | POA: Diagnosis not present

## 2018-11-28 DIAGNOSIS — Z8679 Personal history of other diseases of the circulatory system: Secondary | ICD-10-CM | POA: Diagnosis not present

## 2018-11-28 DIAGNOSIS — Z8719 Personal history of other diseases of the digestive system: Secondary | ICD-10-CM | POA: Diagnosis not present

## 2018-11-28 DIAGNOSIS — N2581 Secondary hyperparathyroidism of renal origin: Secondary | ICD-10-CM | POA: Diagnosis not present

## 2018-11-28 DIAGNOSIS — D631 Anemia in chronic kidney disease: Secondary | ICD-10-CM | POA: Diagnosis not present

## 2018-11-28 DIAGNOSIS — E876 Hypokalemia: Secondary | ICD-10-CM | POA: Diagnosis not present

## 2018-11-28 DIAGNOSIS — D509 Iron deficiency anemia, unspecified: Secondary | ICD-10-CM | POA: Diagnosis not present

## 2018-11-28 DIAGNOSIS — Z992 Dependence on renal dialysis: Secondary | ICD-10-CM | POA: Diagnosis not present

## 2018-11-28 DIAGNOSIS — M3214 Glomerular disease in systemic lupus erythematosus: Secondary | ICD-10-CM | POA: Diagnosis not present

## 2018-11-28 DIAGNOSIS — N186 End stage renal disease: Secondary | ICD-10-CM | POA: Diagnosis not present

## 2018-11-28 DIAGNOSIS — R195 Other fecal abnormalities: Secondary | ICD-10-CM | POA: Diagnosis not present

## 2018-11-30 DIAGNOSIS — N186 End stage renal disease: Secondary | ICD-10-CM | POA: Diagnosis not present

## 2018-11-30 DIAGNOSIS — D631 Anemia in chronic kidney disease: Secondary | ICD-10-CM | POA: Diagnosis not present

## 2018-11-30 DIAGNOSIS — D509 Iron deficiency anemia, unspecified: Secondary | ICD-10-CM | POA: Diagnosis not present

## 2018-11-30 DIAGNOSIS — E876 Hypokalemia: Secondary | ICD-10-CM | POA: Diagnosis not present

## 2018-11-30 DIAGNOSIS — N2581 Secondary hyperparathyroidism of renal origin: Secondary | ICD-10-CM | POA: Diagnosis not present

## 2018-11-30 DIAGNOSIS — Z992 Dependence on renal dialysis: Secondary | ICD-10-CM | POA: Diagnosis not present

## 2018-12-03 DIAGNOSIS — D509 Iron deficiency anemia, unspecified: Secondary | ICD-10-CM | POA: Diagnosis not present

## 2018-12-03 DIAGNOSIS — N186 End stage renal disease: Secondary | ICD-10-CM | POA: Diagnosis not present

## 2018-12-03 DIAGNOSIS — N2581 Secondary hyperparathyroidism of renal origin: Secondary | ICD-10-CM | POA: Diagnosis not present

## 2018-12-03 DIAGNOSIS — E876 Hypokalemia: Secondary | ICD-10-CM | POA: Diagnosis not present

## 2018-12-03 DIAGNOSIS — Z992 Dependence on renal dialysis: Secondary | ICD-10-CM | POA: Diagnosis not present

## 2018-12-03 DIAGNOSIS — D631 Anemia in chronic kidney disease: Secondary | ICD-10-CM | POA: Diagnosis not present

## 2018-12-05 DIAGNOSIS — Z992 Dependence on renal dialysis: Secondary | ICD-10-CM | POA: Diagnosis not present

## 2018-12-05 DIAGNOSIS — D509 Iron deficiency anemia, unspecified: Secondary | ICD-10-CM | POA: Diagnosis not present

## 2018-12-05 DIAGNOSIS — E119 Type 2 diabetes mellitus without complications: Secondary | ICD-10-CM | POA: Diagnosis not present

## 2018-12-05 DIAGNOSIS — E876 Hypokalemia: Secondary | ICD-10-CM | POA: Diagnosis not present

## 2018-12-05 DIAGNOSIS — N186 End stage renal disease: Secondary | ICD-10-CM | POA: Diagnosis not present

## 2018-12-05 DIAGNOSIS — D631 Anemia in chronic kidney disease: Secondary | ICD-10-CM | POA: Diagnosis not present

## 2018-12-05 DIAGNOSIS — N2581 Secondary hyperparathyroidism of renal origin: Secondary | ICD-10-CM | POA: Diagnosis not present

## 2018-12-07 DIAGNOSIS — Z992 Dependence on renal dialysis: Secondary | ICD-10-CM | POA: Diagnosis not present

## 2018-12-07 DIAGNOSIS — D509 Iron deficiency anemia, unspecified: Secondary | ICD-10-CM | POA: Diagnosis not present

## 2018-12-07 DIAGNOSIS — N186 End stage renal disease: Secondary | ICD-10-CM | POA: Diagnosis not present

## 2018-12-07 DIAGNOSIS — N2581 Secondary hyperparathyroidism of renal origin: Secondary | ICD-10-CM | POA: Diagnosis not present

## 2018-12-07 DIAGNOSIS — E876 Hypokalemia: Secondary | ICD-10-CM | POA: Diagnosis not present

## 2018-12-07 DIAGNOSIS — D631 Anemia in chronic kidney disease: Secondary | ICD-10-CM | POA: Diagnosis not present

## 2018-12-10 DIAGNOSIS — N186 End stage renal disease: Secondary | ICD-10-CM | POA: Diagnosis not present

## 2018-12-10 DIAGNOSIS — N2581 Secondary hyperparathyroidism of renal origin: Secondary | ICD-10-CM | POA: Diagnosis not present

## 2018-12-10 DIAGNOSIS — Z992 Dependence on renal dialysis: Secondary | ICD-10-CM | POA: Diagnosis not present

## 2018-12-10 DIAGNOSIS — E876 Hypokalemia: Secondary | ICD-10-CM | POA: Diagnosis not present

## 2018-12-10 DIAGNOSIS — D631 Anemia in chronic kidney disease: Secondary | ICD-10-CM | POA: Diagnosis not present

## 2018-12-10 DIAGNOSIS — D509 Iron deficiency anemia, unspecified: Secondary | ICD-10-CM | POA: Diagnosis not present

## 2018-12-12 DIAGNOSIS — Z992 Dependence on renal dialysis: Secondary | ICD-10-CM | POA: Diagnosis not present

## 2018-12-12 DIAGNOSIS — D509 Iron deficiency anemia, unspecified: Secondary | ICD-10-CM | POA: Diagnosis not present

## 2018-12-12 DIAGNOSIS — N186 End stage renal disease: Secondary | ICD-10-CM | POA: Diagnosis not present

## 2018-12-12 DIAGNOSIS — E876 Hypokalemia: Secondary | ICD-10-CM | POA: Diagnosis not present

## 2018-12-12 DIAGNOSIS — N2581 Secondary hyperparathyroidism of renal origin: Secondary | ICD-10-CM | POA: Diagnosis not present

## 2018-12-12 DIAGNOSIS — D631 Anemia in chronic kidney disease: Secondary | ICD-10-CM | POA: Diagnosis not present

## 2018-12-14 DIAGNOSIS — N186 End stage renal disease: Secondary | ICD-10-CM | POA: Diagnosis not present

## 2018-12-14 DIAGNOSIS — D631 Anemia in chronic kidney disease: Secondary | ICD-10-CM | POA: Diagnosis not present

## 2018-12-14 DIAGNOSIS — E876 Hypokalemia: Secondary | ICD-10-CM | POA: Diagnosis not present

## 2018-12-14 DIAGNOSIS — N2581 Secondary hyperparathyroidism of renal origin: Secondary | ICD-10-CM | POA: Diagnosis not present

## 2018-12-14 DIAGNOSIS — Z992 Dependence on renal dialysis: Secondary | ICD-10-CM | POA: Diagnosis not present

## 2018-12-14 DIAGNOSIS — D509 Iron deficiency anemia, unspecified: Secondary | ICD-10-CM | POA: Diagnosis not present

## 2018-12-17 DIAGNOSIS — D631 Anemia in chronic kidney disease: Secondary | ICD-10-CM | POA: Diagnosis not present

## 2018-12-17 DIAGNOSIS — N2581 Secondary hyperparathyroidism of renal origin: Secondary | ICD-10-CM | POA: Diagnosis not present

## 2018-12-17 DIAGNOSIS — N186 End stage renal disease: Secondary | ICD-10-CM | POA: Diagnosis not present

## 2018-12-17 DIAGNOSIS — D509 Iron deficiency anemia, unspecified: Secondary | ICD-10-CM | POA: Diagnosis not present

## 2018-12-17 DIAGNOSIS — Z992 Dependence on renal dialysis: Secondary | ICD-10-CM | POA: Diagnosis not present

## 2018-12-17 DIAGNOSIS — E876 Hypokalemia: Secondary | ICD-10-CM | POA: Diagnosis not present

## 2018-12-19 DIAGNOSIS — D509 Iron deficiency anemia, unspecified: Secondary | ICD-10-CM | POA: Diagnosis not present

## 2018-12-19 DIAGNOSIS — D631 Anemia in chronic kidney disease: Secondary | ICD-10-CM | POA: Diagnosis not present

## 2018-12-19 DIAGNOSIS — Z992 Dependence on renal dialysis: Secondary | ICD-10-CM | POA: Diagnosis not present

## 2018-12-19 DIAGNOSIS — N186 End stage renal disease: Secondary | ICD-10-CM | POA: Diagnosis not present

## 2018-12-19 DIAGNOSIS — N2581 Secondary hyperparathyroidism of renal origin: Secondary | ICD-10-CM | POA: Diagnosis not present

## 2018-12-19 DIAGNOSIS — E876 Hypokalemia: Secondary | ICD-10-CM | POA: Diagnosis not present

## 2018-12-21 DIAGNOSIS — Z992 Dependence on renal dialysis: Secondary | ICD-10-CM | POA: Diagnosis not present

## 2018-12-21 DIAGNOSIS — N2581 Secondary hyperparathyroidism of renal origin: Secondary | ICD-10-CM | POA: Diagnosis not present

## 2018-12-21 DIAGNOSIS — D631 Anemia in chronic kidney disease: Secondary | ICD-10-CM | POA: Diagnosis not present

## 2018-12-21 DIAGNOSIS — D509 Iron deficiency anemia, unspecified: Secondary | ICD-10-CM | POA: Diagnosis not present

## 2018-12-21 DIAGNOSIS — E876 Hypokalemia: Secondary | ICD-10-CM | POA: Diagnosis not present

## 2018-12-21 DIAGNOSIS — N186 End stage renal disease: Secondary | ICD-10-CM | POA: Diagnosis not present

## 2018-12-24 DIAGNOSIS — D509 Iron deficiency anemia, unspecified: Secondary | ICD-10-CM | POA: Diagnosis not present

## 2018-12-24 DIAGNOSIS — Z992 Dependence on renal dialysis: Secondary | ICD-10-CM | POA: Diagnosis not present

## 2018-12-24 DIAGNOSIS — D631 Anemia in chronic kidney disease: Secondary | ICD-10-CM | POA: Diagnosis not present

## 2018-12-24 DIAGNOSIS — E876 Hypokalemia: Secondary | ICD-10-CM | POA: Diagnosis not present

## 2018-12-24 DIAGNOSIS — N2581 Secondary hyperparathyroidism of renal origin: Secondary | ICD-10-CM | POA: Diagnosis not present

## 2018-12-24 DIAGNOSIS — N186 End stage renal disease: Secondary | ICD-10-CM | POA: Diagnosis not present

## 2018-12-26 DIAGNOSIS — Z992 Dependence on renal dialysis: Secondary | ICD-10-CM | POA: Diagnosis not present

## 2018-12-26 DIAGNOSIS — D509 Iron deficiency anemia, unspecified: Secondary | ICD-10-CM | POA: Diagnosis not present

## 2018-12-26 DIAGNOSIS — D631 Anemia in chronic kidney disease: Secondary | ICD-10-CM | POA: Diagnosis not present

## 2018-12-26 DIAGNOSIS — N186 End stage renal disease: Secondary | ICD-10-CM | POA: Diagnosis not present

## 2018-12-26 DIAGNOSIS — E876 Hypokalemia: Secondary | ICD-10-CM | POA: Diagnosis not present

## 2018-12-26 DIAGNOSIS — N2581 Secondary hyperparathyroidism of renal origin: Secondary | ICD-10-CM | POA: Diagnosis not present

## 2018-12-28 DIAGNOSIS — D631 Anemia in chronic kidney disease: Secondary | ICD-10-CM | POA: Diagnosis not present

## 2018-12-28 DIAGNOSIS — D509 Iron deficiency anemia, unspecified: Secondary | ICD-10-CM | POA: Diagnosis not present

## 2018-12-28 DIAGNOSIS — N2581 Secondary hyperparathyroidism of renal origin: Secondary | ICD-10-CM | POA: Diagnosis not present

## 2018-12-28 DIAGNOSIS — Z992 Dependence on renal dialysis: Secondary | ICD-10-CM | POA: Diagnosis not present

## 2018-12-28 DIAGNOSIS — E876 Hypokalemia: Secondary | ICD-10-CM | POA: Diagnosis not present

## 2018-12-28 DIAGNOSIS — N186 End stage renal disease: Secondary | ICD-10-CM | POA: Diagnosis not present

## 2018-12-29 DIAGNOSIS — N186 End stage renal disease: Secondary | ICD-10-CM | POA: Diagnosis not present

## 2018-12-29 DIAGNOSIS — M3214 Glomerular disease in systemic lupus erythematosus: Secondary | ICD-10-CM | POA: Diagnosis not present

## 2018-12-29 DIAGNOSIS — Z992 Dependence on renal dialysis: Secondary | ICD-10-CM | POA: Diagnosis not present

## 2018-12-31 DIAGNOSIS — N186 End stage renal disease: Secondary | ICD-10-CM | POA: Diagnosis not present

## 2018-12-31 DIAGNOSIS — Z992 Dependence on renal dialysis: Secondary | ICD-10-CM | POA: Diagnosis not present

## 2018-12-31 DIAGNOSIS — D509 Iron deficiency anemia, unspecified: Secondary | ICD-10-CM | POA: Diagnosis not present

## 2018-12-31 DIAGNOSIS — D631 Anemia in chronic kidney disease: Secondary | ICD-10-CM | POA: Diagnosis not present

## 2018-12-31 DIAGNOSIS — E876 Hypokalemia: Secondary | ICD-10-CM | POA: Diagnosis not present

## 2018-12-31 DIAGNOSIS — N2581 Secondary hyperparathyroidism of renal origin: Secondary | ICD-10-CM | POA: Diagnosis not present

## 2019-01-02 DIAGNOSIS — N2581 Secondary hyperparathyroidism of renal origin: Secondary | ICD-10-CM | POA: Diagnosis not present

## 2019-01-02 DIAGNOSIS — Z992 Dependence on renal dialysis: Secondary | ICD-10-CM | POA: Diagnosis not present

## 2019-01-02 DIAGNOSIS — D631 Anemia in chronic kidney disease: Secondary | ICD-10-CM | POA: Diagnosis not present

## 2019-01-02 DIAGNOSIS — E876 Hypokalemia: Secondary | ICD-10-CM | POA: Diagnosis not present

## 2019-01-02 DIAGNOSIS — D509 Iron deficiency anemia, unspecified: Secondary | ICD-10-CM | POA: Diagnosis not present

## 2019-01-02 DIAGNOSIS — N186 End stage renal disease: Secondary | ICD-10-CM | POA: Diagnosis not present

## 2019-01-04 DIAGNOSIS — D509 Iron deficiency anemia, unspecified: Secondary | ICD-10-CM | POA: Diagnosis not present

## 2019-01-04 DIAGNOSIS — Z992 Dependence on renal dialysis: Secondary | ICD-10-CM | POA: Diagnosis not present

## 2019-01-04 DIAGNOSIS — N2581 Secondary hyperparathyroidism of renal origin: Secondary | ICD-10-CM | POA: Diagnosis not present

## 2019-01-04 DIAGNOSIS — E876 Hypokalemia: Secondary | ICD-10-CM | POA: Diagnosis not present

## 2019-01-04 DIAGNOSIS — D631 Anemia in chronic kidney disease: Secondary | ICD-10-CM | POA: Diagnosis not present

## 2019-01-04 DIAGNOSIS — N186 End stage renal disease: Secondary | ICD-10-CM | POA: Diagnosis not present

## 2019-01-07 DIAGNOSIS — D509 Iron deficiency anemia, unspecified: Secondary | ICD-10-CM | POA: Diagnosis not present

## 2019-01-07 DIAGNOSIS — N2581 Secondary hyperparathyroidism of renal origin: Secondary | ICD-10-CM | POA: Diagnosis not present

## 2019-01-07 DIAGNOSIS — Z992 Dependence on renal dialysis: Secondary | ICD-10-CM | POA: Diagnosis not present

## 2019-01-07 DIAGNOSIS — N186 End stage renal disease: Secondary | ICD-10-CM | POA: Diagnosis not present

## 2019-01-07 DIAGNOSIS — E876 Hypokalemia: Secondary | ICD-10-CM | POA: Diagnosis not present

## 2019-01-07 DIAGNOSIS — D631 Anemia in chronic kidney disease: Secondary | ICD-10-CM | POA: Diagnosis not present

## 2019-01-09 DIAGNOSIS — N2581 Secondary hyperparathyroidism of renal origin: Secondary | ICD-10-CM | POA: Diagnosis not present

## 2019-01-09 DIAGNOSIS — E876 Hypokalemia: Secondary | ICD-10-CM | POA: Diagnosis not present

## 2019-01-09 DIAGNOSIS — D509 Iron deficiency anemia, unspecified: Secondary | ICD-10-CM | POA: Diagnosis not present

## 2019-01-09 DIAGNOSIS — Z992 Dependence on renal dialysis: Secondary | ICD-10-CM | POA: Diagnosis not present

## 2019-01-09 DIAGNOSIS — D631 Anemia in chronic kidney disease: Secondary | ICD-10-CM | POA: Diagnosis not present

## 2019-01-09 DIAGNOSIS — N186 End stage renal disease: Secondary | ICD-10-CM | POA: Diagnosis not present

## 2019-01-11 DIAGNOSIS — E876 Hypokalemia: Secondary | ICD-10-CM | POA: Diagnosis not present

## 2019-01-11 DIAGNOSIS — N2581 Secondary hyperparathyroidism of renal origin: Secondary | ICD-10-CM | POA: Diagnosis not present

## 2019-01-11 DIAGNOSIS — Z992 Dependence on renal dialysis: Secondary | ICD-10-CM | POA: Diagnosis not present

## 2019-01-11 DIAGNOSIS — D631 Anemia in chronic kidney disease: Secondary | ICD-10-CM | POA: Diagnosis not present

## 2019-01-11 DIAGNOSIS — N186 End stage renal disease: Secondary | ICD-10-CM | POA: Diagnosis not present

## 2019-01-11 DIAGNOSIS — D509 Iron deficiency anemia, unspecified: Secondary | ICD-10-CM | POA: Diagnosis not present

## 2019-01-14 DIAGNOSIS — D509 Iron deficiency anemia, unspecified: Secondary | ICD-10-CM | POA: Diagnosis not present

## 2019-01-14 DIAGNOSIS — N2581 Secondary hyperparathyroidism of renal origin: Secondary | ICD-10-CM | POA: Diagnosis not present

## 2019-01-14 DIAGNOSIS — D631 Anemia in chronic kidney disease: Secondary | ICD-10-CM | POA: Diagnosis not present

## 2019-01-14 DIAGNOSIS — Z992 Dependence on renal dialysis: Secondary | ICD-10-CM | POA: Diagnosis not present

## 2019-01-14 DIAGNOSIS — N186 End stage renal disease: Secondary | ICD-10-CM | POA: Diagnosis not present

## 2019-01-14 DIAGNOSIS — E876 Hypokalemia: Secondary | ICD-10-CM | POA: Diagnosis not present

## 2019-01-16 DIAGNOSIS — N2581 Secondary hyperparathyroidism of renal origin: Secondary | ICD-10-CM | POA: Diagnosis not present

## 2019-01-16 DIAGNOSIS — D509 Iron deficiency anemia, unspecified: Secondary | ICD-10-CM | POA: Diagnosis not present

## 2019-01-16 DIAGNOSIS — E876 Hypokalemia: Secondary | ICD-10-CM | POA: Diagnosis not present

## 2019-01-16 DIAGNOSIS — D631 Anemia in chronic kidney disease: Secondary | ICD-10-CM | POA: Diagnosis not present

## 2019-01-16 DIAGNOSIS — Z992 Dependence on renal dialysis: Secondary | ICD-10-CM | POA: Diagnosis not present

## 2019-01-16 DIAGNOSIS — N186 End stage renal disease: Secondary | ICD-10-CM | POA: Diagnosis not present

## 2019-01-17 ENCOUNTER — Other Ambulatory Visit: Payer: Self-pay

## 2019-01-17 DIAGNOSIS — Z20822 Contact with and (suspected) exposure to covid-19: Secondary | ICD-10-CM

## 2019-01-18 DIAGNOSIS — N2581 Secondary hyperparathyroidism of renal origin: Secondary | ICD-10-CM | POA: Diagnosis not present

## 2019-01-18 DIAGNOSIS — D631 Anemia in chronic kidney disease: Secondary | ICD-10-CM | POA: Diagnosis not present

## 2019-01-18 DIAGNOSIS — Z992 Dependence on renal dialysis: Secondary | ICD-10-CM | POA: Diagnosis not present

## 2019-01-18 DIAGNOSIS — N186 End stage renal disease: Secondary | ICD-10-CM | POA: Diagnosis not present

## 2019-01-18 DIAGNOSIS — E876 Hypokalemia: Secondary | ICD-10-CM | POA: Diagnosis not present

## 2019-01-18 DIAGNOSIS — D509 Iron deficiency anemia, unspecified: Secondary | ICD-10-CM | POA: Diagnosis not present

## 2019-01-20 DIAGNOSIS — E876 Hypokalemia: Secondary | ICD-10-CM | POA: Diagnosis not present

## 2019-01-20 DIAGNOSIS — D509 Iron deficiency anemia, unspecified: Secondary | ICD-10-CM | POA: Diagnosis not present

## 2019-01-20 DIAGNOSIS — Z992 Dependence on renal dialysis: Secondary | ICD-10-CM | POA: Diagnosis not present

## 2019-01-20 DIAGNOSIS — N2581 Secondary hyperparathyroidism of renal origin: Secondary | ICD-10-CM | POA: Diagnosis not present

## 2019-01-20 DIAGNOSIS — D631 Anemia in chronic kidney disease: Secondary | ICD-10-CM | POA: Diagnosis not present

## 2019-01-20 DIAGNOSIS — N186 End stage renal disease: Secondary | ICD-10-CM | POA: Diagnosis not present

## 2019-01-20 LAB — NOVEL CORONAVIRUS, NAA: SARS-CoV-2, NAA: NOT DETECTED

## 2019-01-22 ENCOUNTER — Other Ambulatory Visit: Payer: Self-pay

## 2019-01-22 DIAGNOSIS — Z20822 Contact with and (suspected) exposure to covid-19: Secondary | ICD-10-CM

## 2019-01-22 DIAGNOSIS — Z992 Dependence on renal dialysis: Secondary | ICD-10-CM | POA: Diagnosis not present

## 2019-01-22 DIAGNOSIS — E876 Hypokalemia: Secondary | ICD-10-CM | POA: Diagnosis not present

## 2019-01-22 DIAGNOSIS — N186 End stage renal disease: Secondary | ICD-10-CM | POA: Diagnosis not present

## 2019-01-22 DIAGNOSIS — D631 Anemia in chronic kidney disease: Secondary | ICD-10-CM | POA: Diagnosis not present

## 2019-01-22 DIAGNOSIS — N2581 Secondary hyperparathyroidism of renal origin: Secondary | ICD-10-CM | POA: Diagnosis not present

## 2019-01-22 DIAGNOSIS — D509 Iron deficiency anemia, unspecified: Secondary | ICD-10-CM | POA: Diagnosis not present

## 2019-01-24 LAB — NOVEL CORONAVIRUS, NAA: SARS-CoV-2, NAA: NOT DETECTED

## 2019-01-25 DIAGNOSIS — Z992 Dependence on renal dialysis: Secondary | ICD-10-CM | POA: Diagnosis not present

## 2019-01-25 DIAGNOSIS — N186 End stage renal disease: Secondary | ICD-10-CM | POA: Diagnosis not present

## 2019-01-29 ENCOUNTER — Telehealth: Payer: Self-pay | Admitting: Cardiovascular Disease

## 2019-01-29 DIAGNOSIS — I120 Hypertensive chronic kidney disease with stage 5 chronic kidney disease or end stage renal disease: Secondary | ICD-10-CM

## 2019-01-29 DIAGNOSIS — Z992 Dependence on renal dialysis: Secondary | ICD-10-CM

## 2019-01-29 DIAGNOSIS — I2510 Atherosclerotic heart disease of native coronary artery without angina pectoris: Secondary | ICD-10-CM

## 2019-01-29 NOTE — Telephone Encounter (Signed)
Patient is requesting an EKG and Echo order to be put in before her appt with Dr. Acie Fredrickson on 02/12/19.

## 2019-01-29 NOTE — Telephone Encounter (Signed)
Agree for the need of an echo .  She has CAD and ESRD and has been having progressive shortness of breath.  We will get an EKG at the time of her office visit.

## 2019-01-29 NOTE — Telephone Encounter (Signed)
Order for echocardiogram has been placed. I have asked our scheduler to call her. If unable to complete echo before 12/16 office visit, will discuss with patient her preference for timing of test and appointment.

## 2019-02-06 ENCOUNTER — Ambulatory Visit (HOSPITAL_COMMUNITY): Payer: Medicare Other | Attending: Cardiology

## 2019-02-06 ENCOUNTER — Other Ambulatory Visit: Payer: Self-pay

## 2019-02-06 DIAGNOSIS — Z992 Dependence on renal dialysis: Secondary | ICD-10-CM | POA: Diagnosis not present

## 2019-02-06 DIAGNOSIS — I2510 Atherosclerotic heart disease of native coronary artery without angina pectoris: Secondary | ICD-10-CM

## 2019-02-06 DIAGNOSIS — N186 End stage renal disease: Secondary | ICD-10-CM | POA: Diagnosis not present

## 2019-02-06 DIAGNOSIS — I120 Hypertensive chronic kidney disease with stage 5 chronic kidney disease or end stage renal disease: Secondary | ICD-10-CM

## 2019-02-12 ENCOUNTER — Encounter: Payer: Self-pay | Admitting: Cardiovascular Disease

## 2019-02-12 ENCOUNTER — Telehealth (INDEPENDENT_AMBULATORY_CARE_PROVIDER_SITE_OTHER): Payer: Medicare Other | Admitting: Cardiovascular Disease

## 2019-02-12 ENCOUNTER — Other Ambulatory Visit: Payer: Self-pay

## 2019-02-12 VITALS — BP 149/89 | Ht 61.0 in

## 2019-02-12 DIAGNOSIS — Z992 Dependence on renal dialysis: Secondary | ICD-10-CM | POA: Diagnosis not present

## 2019-02-12 DIAGNOSIS — I25119 Atherosclerotic heart disease of native coronary artery with unspecified angina pectoris: Secondary | ICD-10-CM | POA: Diagnosis not present

## 2019-02-12 DIAGNOSIS — Z01810 Encounter for preprocedural cardiovascular examination: Secondary | ICD-10-CM

## 2019-02-12 DIAGNOSIS — N186 End stage renal disease: Secondary | ICD-10-CM

## 2019-02-12 DIAGNOSIS — I2510 Atherosclerotic heart disease of native coronary artery without angina pectoris: Secondary | ICD-10-CM

## 2019-02-12 NOTE — Progress Notes (Signed)
Virtual Visit via Telephone Note   This visit type was conducted due to national recommendations for restrictions regarding the COVID-19 Pandemic (e.g. social distancing) in an effort to limit this patient's exposure and mitigate transmission in our community.  Due to her co-morbid illnesses, this patient is at least at moderate risk for complications without adequate follow up.  This format is felt to be most appropriate for this patient at this time.  The patient did not have access to video technology/had technical difficulties with video requiring transitioning to audio format only (telephone).  All issues noted in this document were discussed and addressed.  No physical exam could be performed with this format.  Please refer to the patient's chart for her  consent to telehealth for [facility].   Date:  02/12/2019   ID:  Paula Bass, DOB Jun 20, 1970, MRN [id number]  Patient Location: Home Provider Location: Office  PCP:  Benito Mccreedy, MD  Cardiologist:  Mertie Moores, MD  Electrophysiologist:  None   Evaluation Performed:  Follow-Up Visit  Chief Complaint:  CAD   History of Present Illness:    Paula Bass is a 48 y.o. female with lupus, end-stage renal disease, on hemodialysis, hypertension, diabetes mellitus, GI bleed.  She has a history of coronary artery disease with a previous stent placed in [place] and a recent PCI/DES to the left circumflex artery in February, 2020.  She has a  90% stenosis in her mid RCA that is being treated medically.  She was seen by Pecolia Ades, NP in September, 2020 and complained of significant shortness of breath and chest pressure with any exertion. Isosorbide was increased from 15 mg to 30 mg a day.  An echocardiogram reveals normal left ventricular systolic function and minimal valvular disease.  No further episodes of cp Has improved her diet and is feeling better. Is  Avoiding processed meats and is feeling better.   Still  makes some urine.   Is watching her fluid intake   She is waiting to get on the renal transplant list.   She is not having any chest pain clinically but her transplant doctor in [place] says that she needs a [place] study to be placed on the renal transplant list.  We need to fax her echocardiogram and her [place] study to [facility] in [place].  Attention Reinaldo Raddle, social worker and also Dr. Posey Pronto. Fax [phone]     The patient does not have symptoms concerning for COVID-19 infection (fever, chills, cough, or new shortness of breath).    Past Medical History:  Diagnosis Date  . CAD (coronary artery disease) 2016  . CAD (coronary artery disease) 04/19/2018   Hx of PCI to [facility] in [place] in 2017 // [place] 03/2018: dLCx 60, OM1 40, OM2 50, pRCA stent ok, mid RCA 90, EF 55-65, PCI: DES to dLCx  . Diabetes mellitus without complication ([place])   . Dialysis patient ([place])   . Diverticulitis   . GI bleeding   . Hypertension   . Lupus ([place])   . Renal disorder    dialysis   Past Surgical History:  Procedure Laterality Date  . CARDIAC SURGERY     cardiac shunt  . CORONARY STENT INTERVENTION N/A 04/19/2018   Procedure: CORONARY STENT INTERVENTION;  Surgeon: [name], MD;  Location: [place] [facility];  Service: Cardiovascular;  Laterality: N/A;  . Kidney graft Left   . LEFT HEART CATH AND CORONARY ANGIOGRAPHY N/A 04/19/2018  Procedure: LEFT HEART CATH AND CORONARY ANGIOGRAPHY;  Surgeon: [name], MD;  Location: [place] CV [facility];  Service: Cardiovascular;  Laterality: N/A;     Current Meds  Medication Sig  . amLODipine (NORVASC) 10 MG tablet Take 10 mg by mouth daily.  Marland Kitchen aspirin EC 81 MG tablet Take 1 tablet (81 mg total) by mouth daily.  Marland Kitchen atorvastatin (LIPITOR) 40 MG tablet Take 40 mg by mouth daily.   . clopidogrel (PLAVIX) 75 MG tablet Take 1 tablet (75 mg total) by mouth daily.  . insulin lispro (HUMALOG) 100 UNIT/ML injection  Inject 30-40 Units into the skin See admin instructions. Inject 40 units into the skin before breakfast and 30 units at bedtime   . isosorbide mononitrate (IMDUR) 30 MG 24 hr tablet Take 1 tablet (30 mg total) by mouth daily.  Marland Kitchen lidocaine-prilocaine (EMLA) cream Apply 1 application topically Every Tuesday,Thursday,and Saturday with dialysis.   Marland Kitchen metoprolol tartrate (LOPRESSOR) 25 MG tablet Take 1 tablet (25 mg total) by mouth 2 (two) times daily.  . nitroGLYCERIN (NITROSTAT) 0.4 MG SL tablet Place 1 tablet (0.4 mg total) under the tongue every 5 (five) minutes as needed.  . pantoprazole (PROTONIX) 40 MG tablet Take 1 tablet (40 mg total) by mouth daily.  Vladimir Faster Glycol-Propyl Glycol (LUBRICANT EYE DROPS) 0.4-0.3 % SOLN Place 1 drop into both eyes 3 (three) times daily as needed (dry/irritated eyes.).  Marland Kitchen predniSONE (DELTASONE) 10 MG tablet Take 10 mg by mouth daily.      Allergies:   Penicillins   Social History   Tobacco Use  . Smoking status: Never Smoker  . Smokeless tobacco: Never Used  Substance Use Topics  . Alcohol use: Never  . Drug use: Never     Family Hx: The patient's family history includes Healthy in her brother and brother; Hypertension in her mother; Other in her father. There is no history of Colon cancer, Esophageal cancer, or Breast cancer.  ROS:   Please see the history of present illness.     All other systems reviewed and are negative.   Prior CV studies:   The following studies were reviewed today:    Labs/Other Tests and Data Reviewed:    EKG:  No ECG reviewed.  Recent Labs: 04/15/2018: ALT 9 04/22/2018: BUN 45; Creatinine, Ser 8.49; Hemoglobin 9.6; Platelets 148; Potassium 4.4; Sodium 135   Recent Lipid Panel Lab Results  Component Value Date/Time   CHOL 120 04/15/2018 09:48 AM   TRIG 92 04/15/2018 09:48 AM   HDL 59 04/15/2018 09:48 AM   CHOLHDL 2.0 04/15/2018 09:48 AM   LDLCALC 43 04/15/2018 09:48 AM    Wt Readings from Last 3  Encounters:  11/18/18 157 lb 6.4 oz (71.4 kg)  06/13/18 145 lb (65.8 kg)  05/08/18 146 lb (66.2 kg)     Objective:    Vital Signs:  Ht 5\' 1"  (1.549 m)   BMI 29.74 kg/m      ASSESSMENT & PLAN:    1. Coronary artery disease: She is doing very well from a coronary standpoint.  She is status post stenting and DES placement to her left circumflex artery.  She has residual stenosis in her mid right coronary artery that Dr. In 1 to treat medically.  She has done well on medical therapy and has not had any episodes of chest discomfort.  She is waiting to get listed for renal transplantation.  The transplant center needs for her to have a [place] study prior  to listing her for transplantation.  We will arrange for a Lexiscan Myoview study.  We will also send echocardiogram as well.  2.  Acute congestive heart failure: The patient was eating lots of processed meats including breakfast sausages and similar foods.  She is feeling quite a bitbetter after improving her diet.  COVID-19 Education: The signs and symptoms of COVID-19 were discussed with the patient and how to seek care for testing (follow up with PCP or arrange E-visit).  The importance of social distancing was discussed today.  Time:   Today, I have spent 28  minutes with the patient with telehealth technology discussing the above problems.     Medication Adjustments/Labs and Tests Ordered: Current medicines are reviewed at length with the patient today.  Concerns regarding medicines are outlined above.   Tests Ordered: No orders of the defined types were placed in this encounter.   Medication Changes: No orders of the defined types were placed in this encounter.   Follow Up:  In Person in 6 month(s)  Signed, Mertie Moores, MD  02/12/2019 3:43 PM    [place]

## 2019-02-12 NOTE — Patient Instructions (Signed)
Medication Instructions:  Your physician recommends that you continue on your current medications as directed. Please refer to the Current Medication list given to you today.  *If you need a refill on your cardiac medications before your next appointment, please call your pharmacy*  Lab Work: None Ordered    Testing/Procedures: Your physician has requested that you have a lexiscan myoview. For further information please visit [email or web address]. Please follow instruction sheet, as given.    Follow-Up: At [facility], you and your health needs are our priority.  As part of our continuing mission to provide you with exceptional heart care, we have created designated Provider Care Teams.  These Care Teams include your primary Cardiologist (physician) and Advanced Practice Providers (APPs -  Physician Assistants and Nurse Practitioners) who all work together to provide you with the care you need, when you need it.  Your next appointment:   6 month(s)  The format for your next appointment:   Either In Person or Virtual  Provider:   Richardson Dopp, PA-C, Robbie Lis, PA-C or Daune Perch, NP

## 2019-02-13 ENCOUNTER — Telehealth (HOSPITAL_COMMUNITY): Payer: Self-pay

## 2019-02-13 NOTE — Telephone Encounter (Signed)
Spoke with the patient, instructions given. She stated that she would be here for her test. Asked to call back with any questions. S.Cregg Jutte EMTP 

## 2019-02-19 ENCOUNTER — Ambulatory Visit (HOSPITAL_COMMUNITY): Payer: Medicare Other | Attending: Cardiology

## 2019-02-19 ENCOUNTER — Other Ambulatory Visit: Payer: Self-pay

## 2019-02-19 ENCOUNTER — Other Ambulatory Visit: Payer: Self-pay | Admitting: *Deleted

## 2019-02-19 DIAGNOSIS — I2510 Atherosclerotic heart disease of native coronary artery without angina pectoris: Secondary | ICD-10-CM | POA: Diagnosis not present

## 2019-02-19 DIAGNOSIS — N186 End stage renal disease: Secondary | ICD-10-CM | POA: Insufficient documentation

## 2019-02-19 DIAGNOSIS — Z01810 Encounter for preprocedural cardiovascular examination: Secondary | ICD-10-CM | POA: Diagnosis not present

## 2019-02-19 DIAGNOSIS — Z992 Dependence on renal dialysis: Secondary | ICD-10-CM | POA: Diagnosis not present

## 2019-02-19 LAB — MYOCARDIAL PERFUSION IMAGING
LV dias vol: 71 mL (ref 46–106)
LV sys vol: 24 mL
Peak HR: 107 {beats}/min
Rest HR: 76 {beats}/min
SDS: 0
SRS: 4
SSS: 4
TID: 0.89

## 2019-02-19 MED ORDER — PANTOPRAZOLE SODIUM 40 MG PO TBEC: 40.0000 mg | DELAYED_RELEASE_TABLET | Freq: Every day | ORAL | 2 refills | Status: DC

## 2019-02-19 MED ORDER — TECHNETIUM TC 99M TETROFOSMIN IV KIT
10.0000 | PACK | Freq: Once | INTRAVENOUS | Status: AC | PRN
  Administered 2019-02-19: 10 via INTRAVENOUS
  Filled 2019-02-19: qty 10

## 2019-02-19 MED ORDER — REGADENOSON 0.4 MG/5ML IV SOLN
0.4000 mg | Freq: Once | INTRAVENOUS | Status: AC
  Administered 2019-02-19: 0.4 mg via INTRAVENOUS

## 2019-02-19 MED ORDER — TECHNETIUM TC 99M TETROFOSMIN IV KIT
31.3000 | PACK | Freq: Once | INTRAVENOUS | Status: AC | PRN
  Administered 2019-02-19: 31.3 via INTRAVENOUS
  Filled 2019-02-19: qty 32

## 2019-02-19 NOTE — Progress Notes (Signed)
Sent refill request of Protonix electronically per fax request

## 2019-03-10 ENCOUNTER — Encounter: Payer: Self-pay | Admitting: Advanced Practice Midwife

## 2019-03-10 ENCOUNTER — Other Ambulatory Visit: Payer: Self-pay

## 2019-03-10 ENCOUNTER — Other Ambulatory Visit (HOSPITAL_COMMUNITY)
Admission: RE | Admit: 2019-03-10 | Discharge: 2019-03-10 | Disposition: A | Discharge status: Home / Self Care | Payer: Medicare HMO | Source: Ambulatory Visit | Attending: Advanced Practice Midwife | Admitting: Advanced Practice Midwife

## 2019-03-10 ENCOUNTER — Ambulatory Visit (INDEPENDENT_AMBULATORY_CARE_PROVIDER_SITE_OTHER): Payer: Medicare HMO | Admitting: Advanced Practice Midwife

## 2019-03-10 VITALS — BP 158/93 | HR 75 | Wt 161.0 lb

## 2019-03-10 DIAGNOSIS — Z1151 Encounter for screening for human papillomavirus (HPV): Secondary | ICD-10-CM | POA: Diagnosis not present

## 2019-03-10 DIAGNOSIS — R87820 Cervical low risk human papillomavirus (HPV) DNA test positive: Secondary | ICD-10-CM

## 2019-03-10 DIAGNOSIS — Z01419 Encounter for gynecological examination (general) (routine) without abnormal findings: Secondary | ICD-10-CM | POA: Diagnosis present

## 2019-03-10 DIAGNOSIS — R87810 Cervical high risk human papillomavirus (HPV) DNA test positive: Secondary | ICD-10-CM | POA: Insufficient documentation

## 2019-03-10 DIAGNOSIS — Z113 Encounter for screening for infections with a predominantly sexual mode of transmission: Secondary | ICD-10-CM

## 2019-03-10 NOTE — Progress Notes (Signed)
History:  Ms. Paula Bass is a 49 y.o. G2P0011 who presents to clinic today for annual well woman exam with PAP smear.   Patient has no complaints. She no longer has a menstrual period, with her LMP recorded at 02/16/18. She sees her PCP regularly, whom manages her lupus and hypertension.   The following portions of the patient's history were reviewed and updated as appropriate: allergies, current medications, family history, past medical history, social history, past surgical history and problem list.  Review of Systems:  Review of Systems  Genitourinary:       Negative for dysuria, discharge, abnormal bleeding.       Objective:  Physical Exam BP (!) 158/93   Pulse 75   Wt 73 kg   BMI 30.42 kg/m  Physical Exam  Constitutional: She is oriented to person, place, and time. She appears well-developed and well-nourished.  HENT:  Head: Normocephalic and atraumatic.  Eyes: Conjunctivae and EOM are normal.  Neck: No thyromegaly present.  Thyroid symmetrical, without nodules or enlargement.   Cardiovascular: Normal rate, regular rhythm and normal heart sounds. Exam reveals no gallop and no friction rub.  No murmur heard. Respiratory: Effort normal and breath sounds normal. No respiratory distress. Right breast exhibits no inverted nipple, no mass and no tenderness. Left breast exhibits no inverted nipple, no mass and no tenderness. Breasts are symmetrical.  Genitourinary:    Genitourinary Comments: Vaginal mucosa is pink and without lesions. Cervix is multiparous. No discharge or mucus present. Gc/ct swab and PAP collected.    Musculoskeletal:        General: Normal range of motion.     Cervical back: Normal range of motion and neck supple.  Neurological: She is alert and oriented to person, place, and time.  Skin: Skin is warm and dry.  Psychiatric: She has a normal mood and affect.       Office Visit from 03/10/2019 in [place]  PHQ-2 Total Score   0      Labs and Imaging No results found for this or any previous visit (from the past 24 hour(s)).  No results found.   Assessment & Plan:  1. Well woman exam with routine gynecological exam Patient reports no complaints or concerns.  - Mammogram Screening Routine; Future  2. Papanicolaou smear of cervix with low risk human papillomavirus (HPV) DNA test positive - Cytology - PAP( [place])  3. Asymptomatic STI screening - Gc/ct cervical swab    Marlinda Mike 03/10/2019 2:40 PM

## 2019-03-10 NOTE — Patient Instructions (Signed)

## 2019-03-10 NOTE — Progress Notes (Signed)
Patient presents for Annual Exam today  Last pap: 03/04/2018 +HPV   STD Screening: Declines   Mammogram : 2019 per pt needs to make appt this yr cannot remember where she had imaging studies.   Family Hx of Breast Cancer: None   DK:EUVH   Pt consents to student in exam room

## 2019-03-11 LAB — CYTOLOGY - PAP
Comment: NEGATIVE
Diagnosis: NEGATIVE
High risk HPV: POSITIVE — AB

## 2019-03-11 LAB — CERVICOVAGINAL ANCILLARY ONLY
Chlamydia: NEGATIVE
Comment: NEGATIVE
Comment: NORMAL
Neisseria Gonorrhea: NEGATIVE

## 2019-03-31 ENCOUNTER — Encounter: Payer: Medicare HMO | Admitting: Obstetrics and Gynecology

## 2019-04-23 ENCOUNTER — Other Ambulatory Visit: Payer: Self-pay | Admitting: Advanced Practice Midwife

## 2019-04-23 ENCOUNTER — Other Ambulatory Visit: Payer: Self-pay | Admitting: Physician Assistant

## 2019-05-13 ENCOUNTER — Ambulatory Visit (INDEPENDENT_AMBULATORY_CARE_PROVIDER_SITE_OTHER): Payer: Medicare HMO | Admitting: Obstetrics and Gynecology

## 2019-05-13 ENCOUNTER — Other Ambulatory Visit (HOSPITAL_COMMUNITY)
Admission: RE | Admit: 2019-05-13 | Discharge: 2019-05-13 | Disposition: A | Discharge status: Home / Self Care | Payer: Medicare HMO | Source: Ambulatory Visit | Attending: Obstetrics and Gynecology | Admitting: Obstetrics and Gynecology

## 2019-05-13 ENCOUNTER — Encounter: Payer: Self-pay | Admitting: Obstetrics and Gynecology

## 2019-05-13 ENCOUNTER — Other Ambulatory Visit: Payer: Self-pay

## 2019-05-13 DIAGNOSIS — R87619 Unspecified abnormal cytological findings in specimens from cervix uteri: Secondary | ICD-10-CM | POA: Insufficient documentation

## 2019-05-13 DIAGNOSIS — N870 Mild cervical dysplasia: Secondary | ICD-10-CM | POA: Diagnosis not present

## 2019-05-13 NOTE — Progress Notes (Signed)
    GYNECOLOGY CLINIC COLPOSCOPY PROCEDURE NOTE  49 y.o. [id number] here for colposcopy for Normal cytology x 2 yrs but HPV + x  2 yrs . Discussed role for HPV in cervical dysplasia, need for surveillance.  Patient given informed consent, signed copy in the chart, time out was performed.  Placed in lithotomy position. Cervix viewed with speculum and colposcope after application of acetic acid.   Colposcopy adequate? Yes  mosaicism noted from 6 to 10 o'clock, Bx at 12 and 7 o'clock plus ECC. Monsel's for hemostasis applied. . All specimens were labelled and sent to pathology.   Patient was given post procedure instructions.  Will follow up pathology and manage accordingly.  Routine preventative health maintenance measures emphasized.    Arlina Robes, MD, [place] Attending [place] for [place]

## 2019-05-13 NOTE — Patient Instructions (Signed)
Colposcopy, Care After This sheet gives you information about how to care for yourself after your procedure. Your doctor may also give you more specific instructions. If you have problems or questions, contact your doctor. What can I expect after the procedure? If you did not have a tissue sample removed (did not have a biopsy), you may only have some spotting for a few days. You can go back to your normal activities. If you had a tissue sample removed, it is common to have:  Soreness and pain. This may last for a few days.  Light-headedness.  Mild bleeding from your vagina or dark-colored, grainy discharge from your vagina. This may last for a few days. You may need to wear a sanitary pad.  Spotting for at least 48 hours after the procedure. Follow these instructions at home:   Take over-the-counter and prescription medicines only as told by your doctor. Ask your doctor what medicines you can start taking again. This is very important if you take blood-thinning medicine.  Do not drive or use heavy machinery while taking prescription pain medicine.  For 3 days, or as long as your doctor tells you, avoid: ? Douching. ? Using tampons. ? Having sex.  If you use birth control (contraception), keep using it.  Limit activity for the first day after the procedure. Ask your doctor what activities are safe for you.  It is up to you to get the results of your procedure. Ask your doctor when your results will be ready.  Keep all follow-up visits as told by your doctor. This is important. Contact a doctor if:  You get a skin rash. Get help right away if:  You are bleeding a lot from your vagina. It is a lot of bleeding if you are using more than one pad an hour for 2 hours in a row.  You have clumps of blood (blood clots) coming from your vagina.  You have a fever.  You have chills  You have pain in your lower belly (pelvic area).  You have signs of infection, such as vaginal  discharge that is: ? Different than usual. ? Yellow. ? Bad-smelling.  You have very pain or cramps in your lower belly that do not get better with medicine.  You feel light-headed.  You feel dizzy.  You pass out (faint). Summary  If you did not have a tissue sample removed (did not have a biopsy), you may only have some spotting for a few days. You can go back to your normal activities.  If you had a tissue sample removed, it is common to have mild pain and spotting for 48 hours.  For 3 days, or as long as your doctor tells you, avoid douching, using tampons and having sex.  Get help right away if you have bleeding, very bad pain, or signs of infection. This information is not intended to replace advice given to you by your health care provider. Make sure you discuss any questions you have with your health care provider. Document Revised: 01/26/2017 Document Reviewed: 11/03/2015 Elsevier Patient Education  2020 Elsevier Inc.  

## 2019-05-15 LAB — SURGICAL PATHOLOGY

## 2019-05-16 ENCOUNTER — Other Ambulatory Visit: Payer: Self-pay

## 2019-05-16 ENCOUNTER — Ambulatory Visit (INDEPENDENT_AMBULATORY_CARE_PROVIDER_SITE_OTHER): Payer: Medicare HMO | Admitting: Podiatry

## 2019-05-16 DIAGNOSIS — E119 Type 2 diabetes mellitus without complications: Secondary | ICD-10-CM

## 2019-05-16 DIAGNOSIS — L853 Xerosis cutis: Secondary | ICD-10-CM | POA: Diagnosis not present

## 2019-05-16 DIAGNOSIS — Z794 Long term (current) use of insulin: Secondary | ICD-10-CM

## 2019-05-16 MED ORDER — AMMONIUM LACTATE 12 % EX LOTN: 1.0000 "application " | TOPICAL_LOTION | CUTANEOUS | 0 refills | Status: DC | PRN

## 2019-05-19 ENCOUNTER — Encounter: Payer: Self-pay | Admitting: Podiatry

## 2019-05-19 NOTE — Progress Notes (Signed)
Subjective:  Patient ID: Paula Bass, female    DOB: May 02, 1970,  MRN: [id number]  Chief Complaint  Patient presents with  . Foot Pain    pt is here for bil numbness in both of her foot. pt states that there is no pain behind it. Pt states that most of the numbness is the bottom and top of the foot.    49 y.o. female presents with the above complaint.  Patient presents with bilateral xerosis to the lower extremity.  She has not tried over-the-counter treatment options.  She states that she puts some lotion but does it once a day.  She also states that she applies Vaseline cream as well.  Patient is a diabetic with last A1c of 7.0.  Patient also states that she has numbness and tingling to both of her lower extremity to the digits.  She states that sometimes she feels cold.  It has been going for about couple of months has progressively gotten worse.  She does not have any pain.  She has not tried any further treatments.  She denies any other acute complaints.  She has not seen anyone else for this.   Review of Systems: Negative except as noted in the HPI. Denies N/V/F/Ch.  Past Medical History:  Diagnosis Date  . Acute diverticulitis 12/01/2017  . CAD (coronary artery disease) 2016  . CAD (coronary artery disease) 04/19/2018   Hx of PCI to [facility] in [place] in 2017 // [place] 03/2018: dLCx 11, OM1 40, OM2 50, pRCA stent ok, mid RCA 90, EF 55-65, PCI: DES to dLCx  . Diabetes mellitus without complication ([place])   . Dialysis patient ([place])   . Diverticulitis   . GI bleeding   . Hypertension   . LGI bleed 12/16/2017  . Lower GI bleed   . Lupus ([place])   . Renal disorder    dialysis  . UTI (urinary tract infection) 12/01/2017    Current Outpatient Medications:  .  amLODipine (NORVASC) 10 MG tablet, Take 10 mg by mouth daily., Disp: , Rfl:  .  aspirin EC 81 MG tablet, Take 1 tablet (81 mg total) by mouth daily., Disp: , Rfl:  .  atorvastatin (LIPITOR) 40 MG tablet, Take 40 mg by mouth daily. , Disp:  , Rfl:  .  clopidogrel (PLAVIX) 75 MG tablet, Take 1 tablet (75 mg total) by mouth daily., Disp: , Rfl:  .  insulin lispro (HUMALOG) 100 UNIT/ML injection, Inject 30-40 Units into the skin See admin instructions. Inject 40 units into the skin before breakfast and 30 units at bedtime , Disp: , Rfl:  .  isosorbide mononitrate (IMDUR) 30 MG 24 hr tablet, Take 1 tablet (30 mg total) by mouth daily., Disp: 90 tablet, Rfl: 3 .  lidocaine-prilocaine (EMLA) cream, Apply 1 application topically Every Tuesday,Thursday,and Saturday with dialysis. , Disp: , Rfl:  .  metoprolol tartrate (LOPRESSOR) 25 MG tablet, Take 1 tablet (25 mg total) by mouth 2 (two) times daily., Disp: 180 tablet, Rfl: 3 .  nitroGLYCERIN (NITROSTAT) 0.4 MG SL tablet, Place 1 tablet (0.4 mg total) under the tongue every 5 (five) minutes as needed., Disp: 25 tablet, Rfl: 2 .  pantoprazole (PROTONIX) 40 MG tablet, Take 1 tablet (40 mg total) by mouth daily., Disp: 30 tablet, Rfl: 2 .  Polyethyl Glycol-Propyl Glycol (LUBRICANT EYE DROPS) 0.4-0.3 % SOLN, Place 1 drop into both eyes 3 (three) times daily as needed (dry/irritated eyes.)., Disp: , Rfl:  .  predniSONE (DELTASONE) 10 MG  tablet, Take 10 mg by mouth daily. , Disp: , Rfl:  .  ammonium lactate (AMLACTIN) 12 % lotion, Apply 1 application topically as needed for dry skin., Disp: 400 g, Rfl: 0  Social History   Tobacco Use  Smoking Status Never Smoker  Smokeless Tobacco Never Used    Allergies  Allergen Reactions  . Penicillins Hives    DID THE REACTION INVOLVE: Swelling of the face/tongue/throat, SOB, or low BP? Yes Sudden or severe rash/hives, skin peeling, or the inside of the mouth or nose? Yes Did it require medical treatment? No When did it last happen?Within the past 10 years If all above answers are "NO", may proceed with cephalosporin use.    Objective:  There were no vitals filed for this visit. There is no height or weight on file to calculate  BMI. Constitutional Well developed. Well nourished.  Vascular Dorsalis pedis pulses palpable bilaterally. Posterior tibial pulses palpable bilaterally. Capillary refill normal to all digits.  No cyanosis or clubbing noted. Pedal hair growth normal.  Neurologic Normal speech. Oriented to person, place, and time. Epicritic sensation to light touch grossly present bilaterally.  Numbness to the distal tips of the digits noted.  Dermatologic  bilateral severe xerosis to the lower extremity noted.  Orthopedic: Normal joint ROM without pain or crepitus bilaterally. No visible deformities. No bony tenderness.   Radiographs: None Assessment:   1. Type 2 diabetes mellitus without complication, with long-term current use of insulin ([place])   2. Xerosis of skin    Plan:  Patient was evaluated and treated and all questions answered.  Xerosis bilateral plantar lower extremity secondary to diabetes -.  I explained to the patient the etiology of xerosis and various treatment options were discussed with the patient in extensive detail.  I believe patient will benefit from prescription lotion to help decrease the amount of xerosis that is present.  Patient has tried over-the-counter lotions which has not helped. -AmLactin lotion was dispensed    No follow-ups on file.

## 2019-05-26 ENCOUNTER — Other Ambulatory Visit: Payer: Self-pay

## 2019-05-26 ENCOUNTER — Ambulatory Visit
Admission: RE | Admit: 2019-05-26 | Discharge: 2019-05-26 | Disposition: A | Discharge status: Home / Self Care | Payer: Medicare HMO | Source: Ambulatory Visit | Attending: Advanced Practice Midwife | Admitting: Advanced Practice Midwife

## 2019-05-26 DIAGNOSIS — Z01419 Encounter for gynecological examination (general) (routine) without abnormal findings: Secondary | ICD-10-CM

## 2019-05-28 ENCOUNTER — Other Ambulatory Visit: Payer: Self-pay | Admitting: Advanced Practice Midwife

## 2019-05-28 DIAGNOSIS — R928 Other abnormal and inconclusive findings on diagnostic imaging of breast: Secondary | ICD-10-CM

## 2019-06-02 ENCOUNTER — Ambulatory Visit
Admission: RE | Admit: 2019-06-02 | Discharge: 2019-06-02 | Disposition: A | Discharge status: Home / Self Care | Payer: Medicare HMO | Source: Ambulatory Visit | Attending: Advanced Practice Midwife | Admitting: Advanced Practice Midwife

## 2019-06-02 ENCOUNTER — Other Ambulatory Visit: Payer: Self-pay

## 2019-06-02 ENCOUNTER — Other Ambulatory Visit: Payer: Self-pay | Admitting: Advanced Practice Midwife

## 2019-06-02 DIAGNOSIS — R928 Other abnormal and inconclusive findings on diagnostic imaging of breast: Secondary | ICD-10-CM

## 2019-06-02 DIAGNOSIS — N6320 Unspecified lump in the left breast, unspecified quadrant: Secondary | ICD-10-CM

## 2019-06-16 ENCOUNTER — Other Ambulatory Visit: Payer: Self-pay

## 2019-06-16 MED ORDER — METOPROLOL TARTRATE 25 MG PO TABS: 25.0000 mg | ORAL_TABLET | Freq: Two times a day (BID) | ORAL | 2 refills | Status: DC

## 2019-07-04 ENCOUNTER — Encounter (HOSPITAL_COMMUNITY): Payer: Self-pay

## 2019-07-04 ENCOUNTER — Inpatient Hospital Stay (HOSPITAL_COMMUNITY)
Admission: EM | Admit: 2019-07-04 | Discharge: 2019-07-11 | DRG: 329 | Disposition: A | Discharge status: Home Health | Payer: Medicare HMO | Attending: Internal Medicine | Admitting: Internal Medicine

## 2019-07-04 ENCOUNTER — Other Ambulatory Visit: Payer: Self-pay

## 2019-07-04 ENCOUNTER — Emergency Department (HOSPITAL_COMMUNITY): Payer: Medicare HMO

## 2019-07-04 DIAGNOSIS — I120 Hypertensive chronic kidney disease with stage 5 chronic kidney disease or end stage renal disease: Secondary | ICD-10-CM | POA: Diagnosis present

## 2019-07-04 DIAGNOSIS — R059 Cough, unspecified: Secondary | ICD-10-CM

## 2019-07-04 DIAGNOSIS — Z20822 Contact with and (suspected) exposure to covid-19: Secondary | ICD-10-CM | POA: Diagnosis present

## 2019-07-04 DIAGNOSIS — K611 Rectal abscess: Secondary | ICD-10-CM | POA: Diagnosis not present

## 2019-07-04 DIAGNOSIS — E785 Hyperlipidemia, unspecified: Secondary | ICD-10-CM | POA: Diagnosis present

## 2019-07-04 DIAGNOSIS — Z7982 Long term (current) use of aspirin: Secondary | ICD-10-CM

## 2019-07-04 DIAGNOSIS — I2510 Atherosclerotic heart disease of native coronary artery without angina pectoris: Secondary | ICD-10-CM | POA: Diagnosis present

## 2019-07-04 DIAGNOSIS — L03317 Cellulitis of buttock: Secondary | ICD-10-CM | POA: Diagnosis present

## 2019-07-04 DIAGNOSIS — D631 Anemia in chronic kidney disease: Secondary | ICD-10-CM | POA: Diagnosis present

## 2019-07-04 DIAGNOSIS — Z79899 Other long term (current) drug therapy: Secondary | ICD-10-CM

## 2019-07-04 DIAGNOSIS — E669 Obesity, unspecified: Secondary | ICD-10-CM | POA: Diagnosis present

## 2019-07-04 DIAGNOSIS — Z992 Dependence on renal dialysis: Secondary | ICD-10-CM

## 2019-07-04 DIAGNOSIS — E1122 Type 2 diabetes mellitus with diabetic chronic kidney disease: Secondary | ICD-10-CM | POA: Diagnosis present

## 2019-07-04 DIAGNOSIS — Z6830 Body mass index (BMI) 30.0-30.9, adult: Secondary | ICD-10-CM

## 2019-07-04 DIAGNOSIS — R34 Anuria and oliguria: Secondary | ICD-10-CM | POA: Diagnosis present

## 2019-07-04 DIAGNOSIS — B9561 Methicillin susceptible Staphylococcus aureus infection as the cause of diseases classified elsewhere: Secondary | ICD-10-CM | POA: Diagnosis present

## 2019-07-04 DIAGNOSIS — I10 Essential (primary) hypertension: Secondary | ICD-10-CM | POA: Diagnosis present

## 2019-07-04 DIAGNOSIS — N2581 Secondary hyperparathyroidism of renal origin: Secondary | ICD-10-CM | POA: Diagnosis present

## 2019-07-04 DIAGNOSIS — R05 Cough: Secondary | ICD-10-CM

## 2019-07-04 DIAGNOSIS — Z8249 Family history of ischemic heart disease and other diseases of the circulatory system: Secondary | ICD-10-CM

## 2019-07-04 DIAGNOSIS — N839 Noninflammatory disorder of ovary, fallopian tube and broad ligament, unspecified: Secondary | ICD-10-CM | POA: Diagnosis present

## 2019-07-04 DIAGNOSIS — M329 Systemic lupus erythematosus, unspecified: Secondary | ICD-10-CM | POA: Diagnosis present

## 2019-07-04 DIAGNOSIS — Z880 Allergy status to penicillin: Secondary | ICD-10-CM

## 2019-07-04 DIAGNOSIS — L304 Erythema intertrigo: Secondary | ICD-10-CM | POA: Diagnosis present

## 2019-07-04 DIAGNOSIS — Z794 Long term (current) use of insulin: Secondary | ICD-10-CM

## 2019-07-04 DIAGNOSIS — E11649 Type 2 diabetes mellitus with hypoglycemia without coma: Secondary | ICD-10-CM | POA: Diagnosis present

## 2019-07-04 DIAGNOSIS — E119 Type 2 diabetes mellitus without complications: Secondary | ICD-10-CM

## 2019-07-04 DIAGNOSIS — N12 Tubulo-interstitial nephritis, not specified as acute or chronic: Secondary | ICD-10-CM

## 2019-07-04 DIAGNOSIS — Z955 Presence of coronary angioplasty implant and graft: Secondary | ICD-10-CM

## 2019-07-04 DIAGNOSIS — IMO0002 Reserved for concepts with insufficient information to code with codable children: Secondary | ICD-10-CM | POA: Diagnosis present

## 2019-07-04 DIAGNOSIS — N186 End stage renal disease: Secondary | ICD-10-CM | POA: Diagnosis present

## 2019-07-04 DIAGNOSIS — Z7902 Long term (current) use of antithrombotics/antiplatelets: Secondary | ICD-10-CM

## 2019-07-04 LAB — COMPREHENSIVE METABOLIC PANEL
ALT: 18 U/L (ref 0–44)
AST: 53 U/L — ABNORMAL HIGH (ref 15–41)
Albumin: 3 g/dL — ABNORMAL LOW (ref 3.5–5.0)
Alkaline Phosphatase: 83 U/L (ref 38–126)
Anion gap: 18 — ABNORMAL HIGH (ref 5–15)
BUN: 63 mg/dL — ABNORMAL HIGH (ref 6–20)
CO2: 22 mmol/L (ref 22–32)
Calcium: 9.1 mg/dL (ref 8.9–10.3)
Chloride: 94 mmol/L — ABNORMAL LOW (ref 98–111)
Creatinine, Ser: 14.61 mg/dL — ABNORMAL HIGH (ref 0.44–1.00)
GFR calc Af Amer: 3 mL/min — ABNORMAL LOW (ref >60)
GFR calc non Af Amer: 3 mL/min — ABNORMAL LOW (ref >60)
Glucose, Bld: 120 mg/dL — ABNORMAL HIGH (ref 70–99)
Potassium: 4.4 mmol/L (ref 3.5–5.1)
Sodium: 134 mmol/L — ABNORMAL LOW (ref 135–145)
Total Bilirubin: 0.8 mg/dL (ref 0.3–1.2)
Total Protein: 7.1 g/dL (ref 6.5–8.1)

## 2019-07-04 LAB — CBC
HCT: 38.4 % (ref 36.0–46.0)
Hemoglobin: 12 g/dL (ref 12.0–15.0)
MCH: 30.6 pg (ref 26.0–34.0)
MCHC: 31.3 g/dL (ref 30.0–36.0)
MCV: 98 fL (ref 80.0–100.0)
Platelets: 162 10*3/uL (ref 150–400)
RBC: 3.92 MIL/uL (ref 3.87–5.11)
RDW: 17.7 % — ABNORMAL HIGH (ref 11.5–15.5)
WBC: 17.7 10*3/uL — ABNORMAL HIGH (ref 4.0–10.5)
nRBC: 0 % (ref 0.0–0.2)

## 2019-07-04 LAB — I-STAT BETA HCG BLOOD, ED (MC, WL, AP ONLY): I-stat hCG, quantitative: 12.9 m[IU]/mL — ABNORMAL HIGH (ref <5)

## 2019-07-04 LAB — LIPASE, BLOOD: Lipase: 50 U/L (ref 11–51)

## 2019-07-04 LAB — HCG, QUANTITATIVE, PREGNANCY: hCG, Beta Chain, Quant, S: 4 m[IU]/mL (ref <5)

## 2019-07-04 LAB — CBG MONITORING, ED: Glucose-Capillary: 111 mg/dL — ABNORMAL HIGH (ref 70–99)

## 2019-07-04 MED ORDER — IOHEXOL 300 MG/ML  SOLN
100.0000 mL | Freq: Once | INTRAMUSCULAR | Status: AC | PRN
  Administered 2019-07-04: 100 mL via INTRAVENOUS

## 2019-07-04 MED ORDER — HYDROMORPHONE HCL 1 MG/ML IJ SOLN
0.5000 mg | Freq: Once | INTRAMUSCULAR | Status: AC
  Administered 2019-07-04: 0.5 mg via INTRAVENOUS
  Filled 2019-07-04: qty 1

## 2019-07-04 MED ORDER — METRONIDAZOLE IN NACL 5-0.79 MG/ML-% IV SOLN
500.0000 mg | Freq: Once | INTRAVENOUS | Status: AC
  Administered 2019-07-04: 500 mg via INTRAVENOUS
  Filled 2019-07-04: qty 100

## 2019-07-04 MED ORDER — SODIUM CHLORIDE 0.9 % IV SOLN
1.0000 g | Freq: Once | INTRAVENOUS | Status: AC
  Administered 2019-07-04: 1 g via INTRAVENOUS
  Filled 2019-07-04: qty 10

## 2019-07-04 MED ORDER — ACETAMINOPHEN 500 MG PO TABS
1000.0000 mg | ORAL_TABLET | Freq: Once | ORAL | Status: AC
  Administered 2019-07-04: 1000 mg via ORAL
  Filled 2019-07-04: qty 2

## 2019-07-04 NOTE — ED Provider Notes (Signed)
[facility] Emergency Department Provider Note MRN:  [id number]  Arrival date & time: 07/04/19     Chief Complaint   Fever, Diarrhea, and Weakness   History of Present Illness   Paula Bass is a 49 y.o. year-old female with a history of CAD, CKD, diabetes, lupus presenting to the ED with chief complaint of fever, diarrhea, weakness.  General malaise, fever, diarrhea, weakness for the past week.  Also noting some pain and swelling to her buttocks.  Denies nausea or vomiting, no chest pain or shortness of breath.  Generalized intermittent abdominal pain.  Also endorses recent antibiotics 2 or 3 weeks ago for diverticulitis.  Symptoms are constant, mild to moderate, no exacerbating relieving factors.  Review of Systems  A complete 10 system review of systems was obtained and all systems are negative except as noted in the HPI and PMH.   Patient's Health History    Past Medical History:  Diagnosis Date  . Acute diverticulitis 12/01/2017  . CAD (coronary artery disease) 2016  . CAD (coronary artery disease) 04/19/2018   Hx of PCI to [facility] in [place] in 2017 // [place] 03/2018: dLCx 31, OM1 40, OM2 50, pRCA stent ok, mid RCA 90, EF 55-65, PCI: DES to dLCx  . Diabetes mellitus without complication ([place])   . Dialysis patient ([place])   . Diverticulitis   . GI bleeding   . Hypertension   . LGI bleed 12/16/2017  . Lower GI bleed   . Lupus ([place])   . Renal disorder    dialysis  . UTI (urinary tract infection) 12/01/2017    Past Surgical History:  Procedure Laterality Date  . CARDIAC SURGERY     cardiac shunt  . CORONARY STENT INTERVENTION N/A 04/19/2018   Procedure: CORONARY STENT INTERVENTION;  Surgeon: [name], MD;  Location: [facility];  Service: Cardiovascular;  Laterality: N/A;  . Kidney graft Left   . LEFT HEART CATH AND CORONARY ANGIOGRAPHY N/A 04/19/2018   Procedure: LEFT HEART CATH AND CORONARY ANGIOGRAPHY;  Surgeon: [name], MD;   Location: [place] [facility];  Service: Cardiovascular;  Laterality: N/A;    Family History  Problem Relation Age of Onset  . Hypertension Mother   . Other Father        unkown health history  . Healthy Brother   . Healthy Brother   . Colon cancer Neg Hx   . Esophageal cancer Neg Hx   . Breast cancer Neg Hx     Social History   Socioeconomic History  . Marital status: Single    Spouse name: Not on file  . Number of children: 1  . Years of education: Not on file  . Highest education level: Not on file  Occupational History  . Occupation: disabled  Tobacco Use  . Smoking status: Never Smoker  . Smokeless tobacco: Never Used  Substance and Sexual Activity  . Alcohol use: Never  . Drug use: Never  . Sexual activity: Yes    Partners: Male    Birth control/protection: Condom  Other Topics Concern  . Not on file  Social History Narrative  . Not on file   Social Determinants of Health   Financial Resource Strain:   . Difficulty of Paying Living Expenses:   Food Insecurity:   . Worried About Charity fundraiser in the Last Year:   . [place] in the Last Year:   Transportation Needs:   . Lack of  Transportation (Medical):   Marland Kitchen Lack of Transportation (Non-Medical):   Physical Activity:   . Days of Exercise per Week:   . Minutes of Exercise per Session:   Stress:   . Feeling of Stress :   Social Connections:   . Frequency of Communication with Friends and Family:   . Frequency of Social Gatherings with Friends and Family:   . Attends Religious Services:   . Active Member of Clubs or Organizations:   . Attends Archivist Meetings:   Marland Kitchen Marital Status:   Intimate Partner Violence:   . Fear of Current or Ex-Partner:   . Emotionally Abused:   Marland Kitchen Physically Abused:   . Sexually Abused:      Physical Exam   Vitals:   07/04/19 2115 07/04/19 2210  BP: 116/65 127/68  Pulse: 85 83  Resp: (!) 30 16  Temp:  98.5 F (36.9 C)  SpO2: 97% 95%     CONSTITUTIONAL: Chronically ill-appearing, NAD NEURO:  Alert and oriented x 3, no focal deficits EYES:  eyes equal and reactive ENT/NECK:  no LAD, no JVD CARDIO: Regular rate, well-perfused, normal S1 and S2 PULM:  CTAB no wheezing or rhonchi GI/GU:  normal bowel sounds, non-distended, non-tender MSK/SPINE:  No gross deformities, no edema SKIN:  no rash, atraumatic PSYCH:  Appropriate speech and behavior  *Additional and/or pertinent findings included in MDM below  Diagnostic and Interventional Summary    EKG Interpretation  Date/Time:  Friday Jul 04 2019 17:15:25 EDT Ventricular Rate:  114 PR Interval:    QRS Duration: 82 QT Interval:  326 QTC Calculation: 449 R Axis:   118 Text Interpretation: Sinus tachycardia Low voltage, precordial leads Consider right ventricular hypertrophy Borderline T abnormalities, anterior leads Confirmed by Gerlene Fee [phone]) on 07/04/2019 11:16:56 PM      Labs Reviewed  COMPREHENSIVE METABOLIC PANEL - Abnormal; Notable for the following components:      Result Value   Sodium 134 (*)    Chloride 94 (*)    Glucose, Bld 120 (*)    BUN 63 (*)    Creatinine, Ser 14.61 (*)    Albumin 3.0 (*)    AST 53 (*)    GFR calc non Af Amer 3 (*)    GFR calc Af Amer 3 (*)    Anion gap 18 (*)    All other components within normal limits  CBC - Abnormal; Notable for the following components:   WBC 17.7 (*)    RDW 17.7 (*)    All other components within normal limits  I-STAT BETA HCG BLOOD, ED (MC, WL, AP ONLY) - Abnormal; Notable for the following components:   I-stat hCG, quantitative 12.9 (*)    All other components within normal limits  CBG MONITORING, ED - Abnormal; Notable for the following components:   Glucose-Capillary 111 (*)    All other components within normal limits  CULTURE, BLOOD (SINGLE)  RESPIRATORY PANEL BY RT PCR (FLU A&B, COVID)  LIPASE, BLOOD  HCG, QUANTITATIVE, PREGNANCY  URINALYSIS, ROUTINE W REFLEX MICROSCOPIC    CT ABDOMEN  PELVIS W CONTRAST  Final Result      Medications  acetaminophen (TYLENOL) tablet 1,000 mg (1,000 mg Oral Given 07/04/19 1832)  HYDROmorphone (DILAUDID) injection 0.5 mg (0.5 mg Intravenous Given 07/04/19 1832)  cefTRIAXone (ROCEPHIN) 1 g in sodium chloride 0.9 % 100 mL IVPB (0 g Intravenous Stopped 07/04/19 1946)  metroNIDAZOLE (FLAGYL) IVPB 500 mg (0 mg Intravenous Stopped 07/04/19 2122)  iohexol (OMNIPAQUE)  300 MG/ML solution 100 mL (100 mLs Intravenous Contrast Given 07/04/19 1936)     Procedures  /  Critical Care Procedures  ED Course and Medical Decision Making  I have reviewed the triage vital signs, the nursing notes, and pertinent available records from the EMR.  Listed above are laboratory and imaging tests that I personally ordered, reviewed, and interpreted and then considered in my medical decision making (see below for details).      Fever, abdominal pain, diarrhea, recent antibiotics, on exam patient also has erythema and induration to the right buttocks, no signs of perianal abscess.  Will CT to evaluate for any signs of intra-abdominal infection and to evaluate the extent of infection to the buttocks.  CT reveals possible pyelonephritis, also likely cellulitis of the buttocks but no abscess.  Patient continues to feel unwell.  Vital signs and fever have improved, we attempted to ambulate and she did quite poorly.  She is concerned that she might feel too unwell to get to dialysis tomorrow.  Will request admission for further management.  Barth Kirks. [place]onia Small, [place] [email or web address]  Final Clinical Impressions(s) / ED Diagnoses     ICD-10-CM   1. Cellulitis of buttock  L03.317     ED Discharge Orders    None       Discharge Instructions Discussed with and Provided to Patient:   Discharge Instructions   None      Maudie Flakes, MD 07/04/19 2320

## 2019-07-04 NOTE — ED Notes (Addendum)
Pt gait was unstable while ambulating. Pt complained of lightheadedness & shortness of breath. Pt was one assisted standing and walking

## 2019-07-04 NOTE — ED Notes (Signed)
Patient returned from CT

## 2019-07-04 NOTE — ED Triage Notes (Signed)
Pt reports fever, diarrhea and generalized weakness for the past week. Pt missed dialysis today due to illness. Last dialysis was on Tuesday. 3 episodes of diarrhea today.

## 2019-07-05 ENCOUNTER — Encounter (HOSPITAL_COMMUNITY): Payer: Self-pay | Admitting: Internal Medicine

## 2019-07-05 DIAGNOSIS — N839 Noninflammatory disorder of ovary, fallopian tube and broad ligament, unspecified: Secondary | ICD-10-CM | POA: Diagnosis present

## 2019-07-05 DIAGNOSIS — D631 Anemia in chronic kidney disease: Secondary | ICD-10-CM | POA: Diagnosis present

## 2019-07-05 DIAGNOSIS — Z20822 Contact with and (suspected) exposure to covid-19: Secondary | ICD-10-CM | POA: Diagnosis present

## 2019-07-05 DIAGNOSIS — Z955 Presence of coronary angioplasty implant and graft: Secondary | ICD-10-CM | POA: Diagnosis not present

## 2019-07-05 DIAGNOSIS — Z79899 Other long term (current) drug therapy: Secondary | ICD-10-CM | POA: Diagnosis not present

## 2019-07-05 DIAGNOSIS — I2510 Atherosclerotic heart disease of native coronary artery without angina pectoris: Secondary | ICD-10-CM | POA: Diagnosis present

## 2019-07-05 DIAGNOSIS — E785 Hyperlipidemia, unspecified: Secondary | ICD-10-CM | POA: Diagnosis present

## 2019-07-05 DIAGNOSIS — E669 Obesity, unspecified: Secondary | ICD-10-CM | POA: Diagnosis present

## 2019-07-05 DIAGNOSIS — Z794 Long term (current) use of insulin: Secondary | ICD-10-CM | POA: Diagnosis not present

## 2019-07-05 DIAGNOSIS — N12 Tubulo-interstitial nephritis, not specified as acute or chronic: Secondary | ICD-10-CM | POA: Diagnosis present

## 2019-07-05 DIAGNOSIS — N2581 Secondary hyperparathyroidism of renal origin: Secondary | ICD-10-CM | POA: Diagnosis present

## 2019-07-05 DIAGNOSIS — E119 Type 2 diabetes mellitus without complications: Secondary | ICD-10-CM | POA: Diagnosis not present

## 2019-07-05 DIAGNOSIS — I120 Hypertensive chronic kidney disease with stage 5 chronic kidney disease or end stage renal disease: Secondary | ICD-10-CM | POA: Diagnosis present

## 2019-07-05 DIAGNOSIS — E1122 Type 2 diabetes mellitus with diabetic chronic kidney disease: Secondary | ICD-10-CM | POA: Diagnosis present

## 2019-07-05 DIAGNOSIS — K611 Rectal abscess: Secondary | ICD-10-CM | POA: Diagnosis present

## 2019-07-05 DIAGNOSIS — Z880 Allergy status to penicillin: Secondary | ICD-10-CM | POA: Diagnosis not present

## 2019-07-05 DIAGNOSIS — L03317 Cellulitis of buttock: Secondary | ICD-10-CM | POA: Diagnosis present

## 2019-07-05 DIAGNOSIS — L304 Erythema intertrigo: Secondary | ICD-10-CM | POA: Diagnosis present

## 2019-07-05 DIAGNOSIS — Z7982 Long term (current) use of aspirin: Secondary | ICD-10-CM | POA: Diagnosis not present

## 2019-07-05 DIAGNOSIS — Z992 Dependence on renal dialysis: Secondary | ICD-10-CM | POA: Diagnosis not present

## 2019-07-05 DIAGNOSIS — E11649 Type 2 diabetes mellitus with hypoglycemia without coma: Secondary | ICD-10-CM | POA: Diagnosis present

## 2019-07-05 DIAGNOSIS — R34 Anuria and oliguria: Secondary | ICD-10-CM | POA: Diagnosis present

## 2019-07-05 DIAGNOSIS — Z7902 Long term (current) use of antithrombotics/antiplatelets: Secondary | ICD-10-CM | POA: Diagnosis not present

## 2019-07-05 DIAGNOSIS — N186 End stage renal disease: Secondary | ICD-10-CM | POA: Diagnosis present

## 2019-07-05 DIAGNOSIS — Z6830 Body mass index (BMI) 30.0-30.9, adult: Secondary | ICD-10-CM | POA: Diagnosis not present

## 2019-07-05 DIAGNOSIS — I10 Essential (primary) hypertension: Secondary | ICD-10-CM | POA: Diagnosis not present

## 2019-07-05 LAB — GLUCOSE, CAPILLARY
Glucose-Capillary: 129 mg/dL — ABNORMAL HIGH (ref 70–99)
Glucose-Capillary: 42 mg/dL — CL (ref 70–99)
Glucose-Capillary: 79 mg/dL (ref 70–99)
Glucose-Capillary: 79 mg/dL (ref 70–99)
Glucose-Capillary: 58 mg/dL — ABNORMAL LOW (ref 70–99)
Glucose-Capillary: 83 mg/dL (ref 70–99)

## 2019-07-05 LAB — RESPIRATORY PANEL BY RT PCR (FLU A&B, COVID)
Influenza A by PCR: NEGATIVE
Influenza B by PCR: NEGATIVE
SARS Coronavirus 2 by RT PCR: NEGATIVE

## 2019-07-05 LAB — MRSA PCR SCREENING: MRSA by PCR: POSITIVE — AB

## 2019-07-05 MED ORDER — MECLIZINE HCL 25 MG PO TABS: 12.5000 mg | ORAL_TABLET | Freq: Two times a day (BID) | ORAL | Status: DC | PRN

## 2019-07-05 MED ORDER — INSULIN ASPART PROT & ASPART (70-30 MIX) 100 UNIT/ML ~~LOC~~ SUSP
35.0000 [IU] | Freq: Two times a day (BID) | SUBCUTANEOUS | Status: DC
  Administered 2019-07-05 (×2): 35 [IU] via SUBCUTANEOUS
  Filled 2019-07-05: qty 10

## 2019-07-05 MED ORDER — CHLORHEXIDINE GLUCONATE CLOTH 2 % EX PADS
6.0000 | MEDICATED_PAD | Freq: Every day | CUTANEOUS | Status: DC
  Administered 2019-07-05 – 2019-07-11 (×5): 6 via TOPICAL

## 2019-07-05 MED ORDER — AMLODIPINE BESYLATE 10 MG PO TABS
10.0000 mg | ORAL_TABLET | Freq: Every day | ORAL | Status: DC
  Administered 2019-07-05 – 2019-07-11 (×4): 10 mg via ORAL
  Filled 2019-07-05 (×6): qty 1

## 2019-07-05 MED ORDER — ACETAMINOPHEN 325 MG PO TABS
650.0000 mg | ORAL_TABLET | ORAL | Status: DC | PRN
  Administered 2019-07-05 – 2019-07-07 (×5): 650 mg via ORAL
  Filled 2019-07-05 (×5): qty 2

## 2019-07-05 MED ORDER — ISOSORBIDE MONONITRATE ER 30 MG PO TB24
30.0000 mg | ORAL_TABLET | Freq: Every day | ORAL | Status: DC
  Administered 2019-07-05 – 2019-07-11 (×6): 30 mg via ORAL
  Filled 2019-07-05 (×6): qty 1

## 2019-07-05 MED ORDER — INSULIN ASPART 100 UNIT/ML ~~LOC~~ SOLN
0.0000 [IU] | Freq: Three times a day (TID) | SUBCUTANEOUS | Status: DC
  Administered 2019-07-07 – 2019-07-10 (×2): 2 [IU] via SUBCUTANEOUS
  Administered 2019-07-10: 1 [IU] via SUBCUTANEOUS
  Administered 2019-07-10: 2 [IU] via SUBCUTANEOUS

## 2019-07-05 MED ORDER — METOPROLOL TARTRATE 25 MG PO TABS
25.0000 mg | ORAL_TABLET | Freq: Two times a day (BID) | ORAL | Status: DC
  Administered 2019-07-05 – 2019-07-11 (×12): 25 mg via ORAL
  Filled 2019-07-05 (×12): qty 1

## 2019-07-05 MED ORDER — ONDANSETRON HCL 4 MG PO TABS: 4.0000 mg | ORAL_TABLET | Freq: Four times a day (QID) | ORAL | Status: DC | PRN

## 2019-07-05 MED ORDER — HEPARIN SODIUM (PORCINE) 5000 UNIT/ML IJ SOLN
5000.0000 [IU] | Freq: Three times a day (TID) | INTRAMUSCULAR | Status: DC
  Administered 2019-07-05 – 2019-07-07 (×5): 5000 [IU] via SUBCUTANEOUS
  Filled 2019-07-05 (×8): qty 1

## 2019-07-05 MED ORDER — ATORVASTATIN CALCIUM 40 MG PO TABS
40.0000 mg | ORAL_TABLET | Freq: Every day | ORAL | Status: DC
  Administered 2019-07-05 – 2019-07-11 (×7): 40 mg via ORAL
  Filled 2019-07-05 (×7): qty 1

## 2019-07-05 MED ORDER — VANCOMYCIN HCL IN DEXTROSE 750-5 MG/150ML-% IV SOLN
750.0000 mg | INTRAVENOUS | Status: DC
  Administered 2019-07-05 – 2019-07-11 (×2): 750 mg via INTRAVENOUS
  Filled 2019-07-05 (×3): qty 150

## 2019-07-05 MED ORDER — ONDANSETRON HCL 4 MG/2ML IJ SOLN
4.0000 mg | Freq: Four times a day (QID) | INTRAMUSCULAR | Status: DC | PRN
  Administered 2019-07-10: 4 mg via INTRAVENOUS
  Filled 2019-07-05: qty 2

## 2019-07-05 MED ORDER — RISAQUAD PO CAPS
1.0000 | ORAL_CAPSULE | Freq: Every day | ORAL | Status: DC
  Administered 2019-07-05 – 2019-07-11 (×7): 1 via ORAL
  Filled 2019-07-05 (×7): qty 1

## 2019-07-05 MED ORDER — PANTOPRAZOLE SODIUM 40 MG PO TBEC
40.0000 mg | DELAYED_RELEASE_TABLET | Freq: Every day | ORAL | Status: DC
  Administered 2019-07-05 – 2019-07-11 (×7): 40 mg via ORAL
  Filled 2019-07-05 (×7): qty 1

## 2019-07-05 MED ORDER — CHLORHEXIDINE GLUCONATE CLOTH 2 % EX PADS
6.0000 | MEDICATED_PAD | Freq: Every day | CUTANEOUS | Status: AC
  Administered 2019-07-05 – 2019-07-09 (×2): 6 via TOPICAL

## 2019-07-05 MED ORDER — SODIUM CHLORIDE 0.9 % IV SOLN
500.0000 mg | Freq: Every day | INTRAVENOUS | Status: DC
  Administered 2019-07-05 – 2019-07-09 (×5): 500 mg via INTRAVENOUS
  Filled 2019-07-05 (×6): qty 0.5

## 2019-07-05 MED ORDER — VANCOMYCIN HCL 1.5 G IV SOLR
1500.0000 mg | Freq: Once | INTRAVENOUS | Status: AC
  Administered 2019-07-05: 1500 mg via INTRAVENOUS
  Filled 2019-07-05: qty 1500

## 2019-07-05 MED ORDER — CLOPIDOGREL BISULFATE 75 MG PO TABS
75.0000 mg | ORAL_TABLET | Freq: Every day | ORAL | Status: DC
  Administered 2019-07-05 – 2019-07-11 (×7): 75 mg via ORAL
  Filled 2019-07-05 (×7): qty 1

## 2019-07-05 MED ORDER — MUPIROCIN 2 % EX OINT
1.0000 "application " | TOPICAL_OINTMENT | Freq: Two times a day (BID) | CUTANEOUS | Status: AC
  Administered 2019-07-05 – 2019-07-09 (×10): 1 via NASAL
  Filled 2019-07-05 (×3): qty 22

## 2019-07-05 MED ORDER — RENA-VITE PO TABS
1.0000 | ORAL_TABLET | Freq: Every day | ORAL | Status: DC
  Administered 2019-07-05 – 2019-07-10 (×6): 1 via ORAL
  Filled 2019-07-05 (×7): qty 1

## 2019-07-05 MED ORDER — ASPIRIN EC 81 MG PO TBEC
81.0000 mg | DELAYED_RELEASE_TABLET | Freq: Every day | ORAL | Status: DC
  Administered 2019-07-05 – 2019-07-11 (×7): 81 mg via ORAL
  Filled 2019-07-05 (×7): qty 1

## 2019-07-05 MED ORDER — SODIUM CHLORIDE 0.9 % IV SOLN
1.0000 g | Freq: Once | INTRAVENOUS | Status: AC
  Administered 2019-07-05: 1 g via INTRAVENOUS
  Filled 2019-07-05: qty 1

## 2019-07-05 MED ORDER — PREDNISONE 5 MG PO TABS
5.0000 mg | ORAL_TABLET | Freq: Every day | ORAL | Status: DC
  Administered 2019-07-05 – 2019-07-11 (×6): 5 mg via ORAL
  Filled 2019-07-05 (×7): qty 1

## 2019-07-05 MED ORDER — NITROGLYCERIN 0.4 MG SL SUBL: 0.4000 mg | SUBLINGUAL_TABLET | SUBLINGUAL | Status: DC | PRN

## 2019-07-05 NOTE — Progress Notes (Signed)
Patient admitted earlier this morning by Dr. Hal Hope please see his note for detailed H&P. 49 year old lady prior history of end-stage renal disease on dialysis TTS, hypertension, diabetes, lupus unstable angina s/p PCI, obesity presents to ED for fever and chills for 3 days was found to have cellulitis of the skin and soft tissue of the buttocks and pyelonephritis.  Pt seen and examined at bedside.  She is febrile and has chills this am.  Lungs  Diminished atbases, nowheezing heard.  CVS s1s2, RRR, no JVD,  abd is soft , non tender non distended bowel sounds wnl.     Plan,  Resume IV antibiotics and nephrology consulted for HD.    Hosie Poisson, MD

## 2019-07-05 NOTE — Progress Notes (Signed)
Pharmacy Antibiotic Note  Paula Bass is a 49 y.o. female admitted on 07/04/2019 with cellulitis.  Pharmacy has been consulted for Vancomycin and Meropenem dosing.  Plan: Vancomycin 1500 mg IV now, then Vancomycin 750 mg IV after each HD Meropenem 500 mg IV q24h  Height: 5\' 1"  (154.9 cm) Weight: 68 kg (150 lb) IBW/kg (Calculated) : 47.8  Temp (24hrs), Avg:98.8 F (37.1 C), Min:98.1 F (36.7 C), Max:100.9 F (38.3 C)  Recent Labs  Lab 07/04/19 1238  WBC 17.7*  CREATININE 14.61*    Estimated Creatinine Clearance: 4.2 mL/min (A) (by C-G formula based on SCr of 14.61 mg/dL (H)).    Allergies  Allergen Reactions  . Penicillins Hives    DID THE REACTION INVOLVE: Swelling of the face/tongue/throat, SOB, or low BP? Yes Sudden or severe rash/hives, skin peeling, or the inside of the mouth or nose? Yes Did it require medical treatment? No When did it last happen?Within the past 10 years If all above answers are "NO", may proceed with cephalosporin use.      Caryl Pina 07/05/2019 1:31 AM

## 2019-07-05 NOTE — Plan of Care (Signed)
  Problem: Education: Goal: Knowledge of General Education information will improve Description Including pain rating scale, medication(s)/side effects and non-pharmacologic comfort measures Outcome: Progressing   

## 2019-07-05 NOTE — Progress Notes (Signed)
New Admission Note: ? Arrival Method: via stretcher Mental Orientation: A/O x 4 Telemetry: Box #12 NSR Assessment: Completed Skin: Refer to flowsheet IV: Right AC Pain: 0/10 Tubes: Safety Measures: Safety Fall Prevention Plan discussed with patient. Admission: Completed 5 [place] Orientation: Patient has been orientated to the room, unit and the staff.  Orders have been reviewed and are being implemented. Will continue to monitor the patient. Call light has been placed within reach and bed alarm has been activated.  ? [employer], [place]

## 2019-07-05 NOTE — Progress Notes (Signed)
CBG is 58 at this time. Pt is alert and wanting to drink juice. NT will recheck in the next few minutes. RN made MD on call aware.   Eleanora Neighbor, RN

## 2019-07-05 NOTE — Progress Notes (Addendum)
   07/05/19 0845  Assess: MEWS Score  Temp (!) 101.7 F (38.7 C)  BP (!) 150/77  Pulse Rate 94  Resp 18  SpO2 100 %  O2 Device Room Air  Patient Activity (if Appropriate) In bed  Assess: MEWS Score  MEWS Temp 2  MEWS Systolic 0  MEWS Pulse 0  MEWS RR 0  MEWS LOC 0  MEWS Score 2  MEWS Score Color Yellow  Assess: if the MEWS score is Yellow or Red  Were vital signs taken at a resting state? Yes  Focused Assessment Documented focused assessment  Early Detection of Sepsis Score *See Row Information* High  MEWS guidelines implemented *See Row Information* No, other (Comment) (Provider notification for new order for tylenol)  Take Vital Signs  Increase Vital Sign Frequency  Yellow: Q 2hr X 2 then Q 4hr X 2, if remains yellow, continue Q 4hrs  Notify: Charge Nurse/RN  Name of Charge Nurse/RN Notified Kami RN  Date Charge Nurse/RN Notified 07/05/19  Time Charge Nurse/RN Notified 3668  Notify: Provider  Provider Name/Title Dr. Ignacia Bayley  Date Provider Notified 07/05/19  Time Provider Notified 0900  Notification Type Page  Notification Reason Other (Comment)  Response See new orders  Date of Provider Response 07/05/19  Time of Provider Response 1051

## 2019-07-05 NOTE — Plan of Care (Signed)
?  Problem: Clinical Measurements: ?Goal: Diagnostic test results will improve ?Outcome: Progressing ?  ?Problem: Safety: ?Goal: Ability to remain free from injury will improve ?Outcome: Progressing ?  ?

## 2019-07-05 NOTE — Consult Note (Addendum)
[facility] Renal Consultation Note    Indication for Consultation:  Management of ESRD/hemodialysis; anemia, hypertension/volume and secondary hyperparathyroidism  [id number], [name], MD  HPI: Paula Bass is a 49 y.o. female. ESRD 2/2 DM/HTN on HD TTS at [facility].  Past medical history significant for Lupus, Dm, HTN, obesity, Hx diverticulitis and GIB and unstable angina s/p PCI.  Of note patient is compliant with prescribed dialysis regimen.   Patient presented to the ED due to weakness, fever and chills starting 3 days ago. Reports painful boil on R buttock starting about a week ago that felt hot to the touch.  Denies drainage.  Has had boils previously but done in the last few years.  Additional symptoms include 1 episode of vomiting a few days ago, anorexia, abdominal pain, fatigue and dyspnea on exertion for the last day.  Denies CP, edema, SOB at rest, orthopnea, flank pain, dysuria, hematuria and dizziness. Reports she missed dialysis on Thursday because she was feeling so poorly.    Pertinent findings since admission include tmax 101.7, WBC 17.7, negative respiratory panel, CT abd showed "Stranding of the right inferior perinephric fat concerning for edema possibly related to cyst infection or pyelonephritis" and edema/stranding in the R buttock consistent with cellulitis. She has been admitted to observation for further evaluation and management.   Past Medical History:  Diagnosis Date  . Acute diverticulitis 12/01/2017  . CAD (coronary artery disease) 2016  . CAD (coronary artery disease) 04/19/2018   Hx of PCI to [facility] in [place] in 2017 // [place] 03/2018: dLCx 52, OM1 40, OM2 50, pRCA stent ok, mid RCA 90, EF 55-65, PCI: DES to dLCx  . Diabetes mellitus without complication ([place])   . Dialysis patient ([place])   . Diverticulitis   . GI bleeding   . Hypertension   . LGI bleed 12/16/2017  . Lower GI bleed   . Lupus ([place])   . Renal disorder    dialysis  . UTI  (urinary tract infection) 12/01/2017   Past Surgical History:  Procedure Laterality Date  . CARDIAC SURGERY     cardiac shunt  . CORONARY STENT INTERVENTION N/A 04/19/2018   Procedure: CORONARY STENT INTERVENTION;  Surgeon: [name], MD;  Location: [place] [facility];  Service: Cardiovascular;  Laterality: N/A;  . Kidney graft Left   . LEFT HEART CATH AND CORONARY ANGIOGRAPHY N/A 04/19/2018   Procedure: LEFT HEART CATH AND CORONARY ANGIOGRAPHY;  Surgeon: [name], MD;  Location: [place] [facility];  Service: Cardiovascular;  Laterality: N/A;   Family History  Problem Relation Age of Onset  . Hypertension Mother   . Other Father        unkown health history  . Healthy Brother   . Healthy Brother   . Colon cancer Neg Hx   . Esophageal cancer Neg Hx   . Breast cancer Neg Hx    Social History:  reports that she has never smoked. She has never used smokeless tobacco. She reports that she does not drink alcohol or use drugs. Allergies  Allergen Reactions  . Penicillins Hives    DID THE REACTION INVOLVE: Swelling of the face/tongue/throat, SOB, or low BP? Yes Sudden or severe rash/hives, skin peeling, or the inside of the mouth or nose? Yes Did it require medical treatment? No When did it last happen?Within the past 10 years If all above answers are "NO", may proceed with cephalosporin use.    Prior to Admission medications   Medication Sig  Start Date End Date Taking? Authorizing Provider  amLODipine (NORVASC) 10 MG tablet Take 10 mg by mouth daily.   Yes [provider]  ammonium lactate (AMLACTIN) 12 % lotion Apply 1 application topically as needed for dry skin. 05/16/19  Yes Felipa Furnace, DPM  aspirin EC 81 MG tablet Take 1 tablet (81 mg total) by mouth daily. 04/22/18  Yes Reino Bellis B, NP  atorvastatin (LIPITOR) 40 MG tablet Take 40 mg by mouth daily.  10/25/17  Yes [provider]  AURYXIA 1 GM 210 MG(Fe) tablet Take 1-2 tablets by mouth  See admin instructions. Take 2 tablets three times a day with meals and 1 tablet twice a day with a snack 06/09/19  Yes [provider]  clopidogrel (PLAVIX) 75 MG tablet Take 1 tablet (75 mg total) by mouth daily. 04/22/18  Yes Reino Bellis B, NP  CYANOCOBALAMIN PO Take 2 tablets by mouth daily.   Yes [provider]  insulin NPH-regular Human (70-30) 100 UNIT/ML injection Inject 30-40 Units into the skin 2 (two) times daily. Inject 40 units in the morning and 30 units at bedtime.   Yes [provider]  isosorbide mononitrate (IMDUR) 30 MG 24 hr tablet Take 1 tablet (30 mg total) by mouth daily. 11/18/18  Yes Daune Perch, NP  Lactobacillus (ACIDOPHILUS) CAPS capsule Take 1 capsule by mouth daily.   Yes [provider]  lidocaine-prilocaine (EMLA) cream Apply 1 application topically Every Tuesday,Thursday,and Saturday with dialysis.  11/18/17  Yes [provider]  meclizine (ANTIVERT) 12.5 MG tablet Take 12.5 mg by mouth 2 (two) times daily as needed for dizziness. 06/10/19  Yes [provider]  metoprolol tartrate (LOPRESSOR) 25 MG tablet Take 1 tablet (25 mg total) by mouth 2 (two) times daily. 06/16/19  Yes Nahser, Wonda Cheng, MD  multivitamin (RENA-VIT) TABS tablet Take 1 tablet by mouth daily.   Yes [provider]  nitroGLYCERIN (NITROSTAT) 0.4 MG SL tablet Place 1 tablet (0.4 mg total) under the tongue every 5 (five) minutes as needed. 04/22/18  Yes Cheryln Manly, NP  NYSTATIN powder Apply 1 application topically 3 (three) times daily as needed for rash. 06/24/19  Yes [provider]  OVER THE COUNTER MEDICATION Take 3 tablets by mouth daily. Tumeric and Ginger gummies   Yes [provider]  pantoprazole (PROTONIX) 40 MG tablet Take 1 tablet (40 mg total) by mouth daily. 02/19/19  Yes Lemmon, Lavone Nian, PA  Polyethyl Glycol-Propyl Glycol (LUBRICANT EYE DROPS) 0.4-0.3 % SOLN Place 1 drop into both eyes 3  (three) times daily as needed (dry/irritated eyes.).   Yes [provider]  predniSONE (DELTASONE) 5 MG tablet Take 5-10 mg by mouth daily as needed for pain. 05/14/19  Yes [provider]   Current Facility-Administered Medications  Medication Dose Route Frequency Provider Last Rate Last Admin  . acetaminophen (TYLENOL) tablet 650 mg  650 mg Oral Q4H PRN Hosie Poisson, MD   650 mg at 07/05/19 1007  . acidophilus (RISAQUAD) capsule 1 capsule  1 capsule Oral Daily Rise Patience, MD   1 capsule at 07/05/19 0160  . amLODipine (NORVASC) tablet 10 mg  10 mg Oral Daily Rise Patience, MD   10 mg at 07/05/19 1093  . aspirin EC tablet 81 mg  81 mg Oral Daily Rise Patience, MD   81 mg at 07/05/19 2355  . atorvastatin (LIPITOR) tablet 40 mg  40 mg Oral Daily Rise Patience, MD  40 mg at 07/05/19 0937  . Chlorhexidine Gluconate Cloth 2 % PADS 6 each  6 each Topical Q0600 Rise Patience, MD   6 each at 07/05/19 0441  . Chlorhexidine Gluconate Cloth 2 % PADS 6 each  6 each Topical Q0600 Penninger, [place], [place]   6 each at 07/05/19 1047  . clopidogrel (PLAVIX) tablet 75 mg  75 mg Oral Daily Rise Patience, MD   75 mg at 07/05/19 5732  . heparin injection 5,000 Units  5,000 Units Subcutaneous Q8H Rise Patience, MD      . insulin aspart (novoLOG) injection 0-6 Units  0-6 Units Subcutaneous TID WC Rise Patience, MD      . insulin aspart protamine- aspart (NOVOLOG MIX 70/30) injection 35 Units  35 Units Subcutaneous BID WC Rise Patience, MD   35 Units at 07/05/19 [phone]  . isosorbide mononitrate (IMDUR) 24 hr tablet 30 mg  30 mg Oral Daily Rise Patience, MD   30 mg at 07/05/19 4270  . meclizine (ANTIVERT) tablet 12.5 mg  12.5 mg Oral BID PRN Rise Patience, MD      . meropenem (MERREM) 500 mg in sodium chloride 0.9 % 100 mL IVPB  500 mg Intravenous QHS Rise Patience, MD      . metoprolol tartrate (LOPRESSOR) tablet 25 mg   25 mg Oral BID Rise Patience, MD   25 mg at 07/05/19 6237  . multivitamin (RENA-VIT) tablet 1 tablet  1 tablet Oral QHS Rise Patience, MD   1 tablet at 07/05/19 0324  . mupirocin ointment (BACTROBAN) 2 % 1 application  1 application Nasal BID Rise Patience, MD   1 application at 62/83/15 [phone]  . nitroGLYCERIN (NITROSTAT) SL tablet 0.4 mg  0.4 mg Sublingual Q5 min PRN Rise Patience, MD      . ondansetron [facility]) tablet 4 mg  4 mg Oral Q6H PRN Rise Patience, MD       Or  . ondansetron [facility]) injection 4 mg  4 mg Intravenous Q6H PRN Rise Patience, MD      . pantoprazole (PROTONIX) EC tablet 40 mg  40 mg Oral Daily Rise Patience, MD   40 mg at 07/05/19 6073  . predniSONE (DELTASONE) tablet 5 mg  5 mg Oral Q breakfast Rise Patience, MD   5 mg at 07/05/19 7106  . vancomycin (VANCOCIN) IVPB 750 mg/150 ml premix  750 mg Intravenous Q T,Th,Sa-HD Rise Patience, MD       Labs: Basic Metabolic Panel: Recent Labs  Lab 07/04/19 1238  NA 134*  K 4.4  CL 94*  CO2 22  GLUCOSE 120*  BUN 63*  CREATININE 14.61*  CALCIUM 9.1   Liver Function Tests: Recent Labs  Lab 07/04/19 1238  AST 53*  ALT 18  ALKPHOS 83  BILITOT 0.8  PROT 7.1  ALBUMIN 3.0*   Recent Labs  Lab 07/04/19 1238  LIPASE 50   CBC: Recent Labs  Lab 07/04/19 1238  WBC 17.7*  HGB 12.0  HCT 38.4  MCV 98.0  PLT 162   CBG: Recent Labs  Lab 07/04/19 1852 07/05/19 0652 07/05/19 1122 07/05/19 1157  GLUCAP 111* 129* 42* 79   Studies/Results: CT ABDOMEN PELVIS W CONTRAST  Result Date: 07/04/2019 CLINICAL DATA:  49 year old female with abdominal pain, fever and leukocytosis. EXAM: CT ABDOMEN AND PELVIS WITH CONTRAST TECHNIQUE: Multidetector CT imaging of the abdomen and pelvis was performed using the standard  protocol following bolus administration of intravenous contrast. CONTRAST:  119mL OMNIPAQUE IOHEXOL 300 MG/ML  SOLN COMPARISON:  CT abdomen pelvis dated  04/20/2018. FINDINGS: Lower chest: The visualized lung bases are clear. Multi vessel coronary vascular calcifications noted. Intra-abdominal free air or free fluid. Hepatobiliary: The liver is unremarkable. No intrahepatic biliary ductal dilatation. The gallbladder is unremarkable. Pancreas: Unremarkable. No pancreatic ductal dilatation or surrounding inflammatory changes. Spleen: Normal in size without focal abnormality. Adrenals/Urinary Tract: The adrenal glands are unremarkable. There are bilateral renal cystic lesions as well as additional subcentimeter hypodense lesions which are too small to characterize. There is a 3 cm low attenuating lesion in the inferior pole of the right kidney similar to prior CT. There is slight higher attenuation of this cystic lesion likely represents a complex cyst with internal debris. There is stranding of the right inferior perinephric fat concerning for edema possibly related to cyst infection. This has slightly increased since the prior CT. Clinical correlation and further evaluation with renal ultrasound recommended. Several punctate calcific foci along the periphery of the right inferior pole cyst may represent areas of wall calcification. A 2 cm cyst in the interpolar aspect of the left kidney has a partially calcified wall appears similar to prior CT. There is no hydronephrosis on either side. Punctate nonobstructing bilateral renal calculi versus renal vascular calcification. The visualized ureters and urinary bladder appear unremarkable. Stomach/Bowel: There is sigmoid diverticulosis without active inflammatory changes. There is no bowel obstruction. The appendix is normal. Vascular/Lymphatic: Moderate aortoiliac atherosclerotic disease. The IVC is unremarkable. No portal venous gas. There is no adenopathy. Reproductive: The uterus is anteverted. Multiple uterine fibroids noted. There is a 2 cm slightly higher attenuating lesion in the right ovary which is not well  characterized. Pelvic ultrasound is recommended for better evaluation on a nonemergent/outpatient basis. The left ovary is unremarkable. Other: There is stranding and edema of the subcutaneous soft tissues of the right gluteal region and right buttock. No fluid collection. Musculoskeletal: No acute or significant osseous findings. IMPRESSION: 1. Stranding of the right inferior perinephric fat concerning for edema possibly related to cyst infection or pyelonephritis. Clinical correlation and further evaluation with renal ultrasound recommended. 2. Sigmoid diverticulosis. No bowel obstruction. Normal appendix. 3. A 2 cm slightly higher attenuating lesion in the right ovary is not well characterized. Pelvic ultrasound is recommended for better evaluation on a nonemergent/outpatient basis. 4. Right gluteal subcutaneous edema and stranding. No fluid collection. 5. Aortic Atherosclerosis (ICD10-I70.0). Electronically Signed   By: Anner Crete M.D.   On: 07/04/2019 19:53    ROS: All others negative except those listed in HPI.  Physical Exam: Vitals:   07/05/19 0620 07/05/19 0845 07/05/19 1045 07/05/19 1107  BP: (!) 147/75 (!) 150/77 118/67 118/67  Pulse: 83 94  94  Resp:  18 18 18   Temp: 99.2 F (37.3 C) (!) 101.7 F (38.7 C)  98.1 F (36.7 C)  TempSrc: Oral Oral  Oral  SpO2: 95% 100% 100% 100%  Weight:      Height:         General: WDWN, pleasant female in NAD, +chills, +skin hot to the touch Head: NCAT sclera not icteric MMM Neck: Supple. No lymphadenopathy. No JVD Lungs: CTA bilaterally. No wheeze, rales or rhonchi. Breathing is unlabored. Heart: RRR. No murmur, rubs or gallops.  Abdomen: soft, nontender, +BS, no guarding, no rebound tenderness Lower extremities:no edema, ischemic changes, or open wounds  Neuro: AAOx3. Moves all extremities spontaneously. Psych:  Responds to questions appropriately  with a normal affect. Dialysis Access: LU AVG +b  Dialysis Orders:  [facility] [place]   3.25hrs, BFR 400, DFR 800,  EDW 69.5kg, 2K/ 2.25Ca  Access: LU AVG  Heparin none Mircera 50 mcg q4wks - last 4/27 Hectorol 82mcg IV qHD   Assessment/Plan: 1.  Fever/chills - CT showed likely cellulitis of R buttock and possible pyelonephritis vs infected cyst.  Renal US recommended for further determination.  BC ordered. UA ordered. Vanc ordered.   2. Abnormal ovarian lesion - incidental findings on CT. will need additional workup. Per primary 3.  ESRD -  On HD TTS. Missed last HD. K 4.4. Orders written for HD today per regular schedule.   4.  Hypertension/volume  - BP well controlled.  Does not appear volume overloaded but reports dyspnea.  Plan for HD today with net UF 2-3L as tolerated.  5.  Anemia of CKD - Hgb 12.0.  No indication for ESA at this time.  6.  Secondary Hyperparathyroidism -  Ca at goal. Will check phos.  Continue VDRA and binders.   7.  Nutrition - Renal diet w/fluid restrictions.  8. Lupus - on chronic prednisone 9. CAD s/p stenting - per primary 10. DMT2 - on insulin, per primary  Paula Mow, PA-C [facility] Pager: [phone] 07/05/2019, 11:59 AM   Nephrology attending:  Patient was seen and examined at bedside.  Chart reviewed.  I agree with the consult note including assessment and plan as outlined above.  50 year old female ESRD on HD admitted with fever chills possibly due to right buttock cellulitis versus kidney infection.  Already on broad-spectrum antibiotics.  Plan for HD today as per her TTS schedule.  Volume status acceptable.  I will lower the goal to 1 to 2 L as tolerated.  Hemoglobin at goal.  Katheran James, MD [facility].

## 2019-07-05 NOTE — H&P (Addendum)
History and Physical    Paula Bass NTZ:[id number] DOB: December 12, 1970 DOA: 07/04/2019  PCP: Benito Mccreedy, MD  Patient coming from: Home.  Chief Complaint: Not feeling well.  HPI: Paula Bass is a 49 y.o. female with history of ESRD on hemodialysis, history of lupus on prednisone, CAD status post stenting on Plavix aspirin diabetes mellitus type 2 has not been feeling well over the last 3 to 4 days with nausea vomiting and diarrhea and right buttock pain.  Denies any productive cough or shortness of breath.  Patient missed her dialysis yesterday because patient was not feeling well.  Has been having fever chills.  Presents to the ER because of worsening symptoms.  ED Course: In the ER patient was febrile and tachycardic.  Covid test was negative.  WBC was 17 platelets 162.  CT abdomen pelvis shows features concerning for right perinephric stranding right ovarian lesion and also right buttock cellulitis.  Blood cultures were obtained and started on empiric antibiotics.  Review of Systems: As per HPI, rest all negative.   Past Medical History:  Diagnosis Date  . Acute diverticulitis 12/01/2017  . CAD (coronary artery disease) 2016  . CAD (coronary artery disease) 04/19/2018   Hx of PCI to [facility] in [place] in 2017 // [place] 03/2018: dLCx 61, OM1 40, OM2 50, pRCA stent ok, mid RCA 90, EF 55-65, PCI: DES to dLCx  . Diabetes mellitus without complication ([place])   . Dialysis patient ([place])   . Diverticulitis   . GI bleeding   . Hypertension   . LGI bleed 12/16/2017  . Lower GI bleed   . Lupus ([place])   . Renal disorder    dialysis  . UTI (urinary tract infection) 12/01/2017    Past Surgical History:  Procedure Laterality Date  . CARDIAC SURGERY     cardiac shunt  . CORONARY STENT INTERVENTION N/A 04/19/2018   Procedure: CORONARY STENT INTERVENTION;  Surgeon: [name], MD;  Location: [facility];  Service: Cardiovascular;  Laterality: N/A;  . Kidney graft Left     . LEFT HEART CATH AND CORONARY ANGIOGRAPHY N/A 04/19/2018   Procedure: LEFT HEART CATH AND CORONARY ANGIOGRAPHY;  Surgeon: [name], MD;  Location: [facility];  Service: Cardiovascular;  Laterality: N/A;     reports that she has never smoked. She has never used smokeless tobacco. She reports that she does not drink alcohol or use drugs.  Allergies  Allergen Reactions  . Penicillins Hives    DID THE REACTION INVOLVE: Swelling of the face/tongue/throat, SOB, or low BP? Yes Sudden or severe rash/hives, skin peeling, or the inside of the mouth or nose? Yes Did it require medical treatment? No When did it last happen?Within the past 10 years If all above answers are "NO", may proceed with cephalosporin use.     Family History  Problem Relation Age of Onset  . Hypertension Mother   . Other Father        unkown health history  . Healthy Brother   . Healthy Brother   . Colon cancer Neg Hx   . Esophageal cancer Neg Hx   . Breast cancer Neg Hx     Prior to Admission medications   Medication Sig Start Date End Date Taking? Authorizing Provider  amLODipine (NORVASC) 10 MG tablet Take 10 mg by mouth daily.   Yes [provider]  ammonium lactate (AMLACTIN) 12 % lotion Apply 1 application topically as needed for dry skin. 05/16/19  Yes Felipa Furnace, DPM  aspirin EC 81 MG tablet Take 1 tablet (81 mg total) by mouth daily. 04/22/18  Yes Reino Bellis B, NP  atorvastatin (LIPITOR) 40 MG tablet Take 40 mg by mouth daily.  10/25/17  Yes [provider]  AURYXIA 1 GM 210 MG(Fe) tablet Take 1-2 tablets by mouth See admin instructions. Take 2 tablets three times a day with meals and 1 tablet twice a day with a snack 06/09/19  Yes [provider]  clopidogrel (PLAVIX) 75 MG tablet Take 1 tablet (75 mg total) by mouth daily. 04/22/18  Yes Reino Bellis B, NP  CYANOCOBALAMIN PO Take 2 tablets by mouth daily.   Yes [provider]  insulin  NPH-regular Human (70-30) 100 UNIT/ML injection Inject 30-40 Units into the skin 2 (two) times daily. Inject 40 units in the morning and 30 units at bedtime.   Yes [provider]  isosorbide mononitrate (IMDUR) 30 MG 24 hr tablet Take 1 tablet (30 mg total) by mouth daily. 11/18/18  Yes Daune Perch, NP  Lactobacillus (ACIDOPHILUS) CAPS capsule Take 1 capsule by mouth daily.   Yes [provider]  lidocaine-prilocaine (EMLA) cream Apply 1 application topically Every Tuesday,Thursday,and Saturday with dialysis.  11/18/17  Yes [provider]  meclizine (ANTIVERT) 12.5 MG tablet Take 12.5 mg by mouth 2 (two) times daily as needed for dizziness. 06/10/19  Yes [provider]  metoprolol tartrate (LOPRESSOR) 25 MG tablet Take 1 tablet (25 mg total) by mouth 2 (two) times daily. 06/16/19  Yes Nahser, Wonda Cheng, MD  multivitamin (RENA-VIT) TABS tablet Take 1 tablet by mouth daily.   Yes [provider]  nitroGLYCERIN (NITROSTAT) 0.4 MG SL tablet Place 1 tablet (0.4 mg total) under the tongue every 5 (five) minutes as needed. 04/22/18  Yes Cheryln Manly, NP  NYSTATIN powder Apply 1 application topically 3 (three) times daily as needed for rash. 06/24/19  Yes [provider]  OVER THE COUNTER MEDICATION Take 3 tablets by mouth daily. Tumeric and Ginger gummies   Yes [provider]  pantoprazole (PROTONIX) 40 MG tablet Take 1 tablet (40 mg total) by mouth daily. 02/19/19  Yes Lemmon, Lavone Nian, PA  Polyethyl Glycol-Propyl Glycol (LUBRICANT EYE DROPS) 0.4-0.3 % SOLN Place 1 drop into both eyes 3 (three) times daily as needed (dry/irritated eyes.).   Yes [provider]  predniSONE (DELTASONE) 5 MG tablet Take 5-10 mg by mouth daily as needed for pain. 05/14/19  Yes [provider]    Physical Exam: Constitutional: Moderately built and nourished. Vitals:   07/04/19 2210 07/04/19 2230 07/04/19 2353 07/05/19 0030  BP:  127/68 (!) 109/59 (!) 103/59 133/79  Pulse: 83 83 80 84  Resp: 16 (!) 34 (!) 25 18  Temp: 98.5 F (36.9 C)  98.1 F (36.7 C) 98.6 F (37 C)  TempSrc: Oral  Oral Oral  SpO2: 95% 97% 99% 93%  Weight:      Height:       Eyes: Anicteric no pallor. ENMT: No discharge from the ears eyes nose or mouth. Neck: No mass felt.  No neck rigidity. Respiratory: No rhonchi or crepitations. Cardiovascular: S1-S2 heard. Abdomen: Soft nontender bowel sounds present. Musculoskeletal: No edema. Skin: Tenderness in the right buttock area. Neurologic: Alert awake oriented to time place and person.  Moves all extremities. Psychiatric: Appears normal.   Labs on Admission: I have personally reviewed following labs and imaging studies  CBC: Recent Labs  Lab 07/04/19  1238  WBC 17.7*  HGB 12.0  HCT 38.4  MCV 98.0  PLT 315   Basic Metabolic Panel: Recent Labs  Lab 07/04/19 1238  NA 134*  K 4.4  CL 94*  CO2 22  GLUCOSE 120*  BUN 63*  CREATININE 14.61*  CALCIUM 9.1   GFR: Estimated Creatinine Clearance: 4.2 mL/min (A) (by C-G formula based on SCr of 14.61 mg/dL (H)). Liver Function Tests: Recent Labs  Lab 07/04/19 1238  AST 53*  ALT 18  ALKPHOS 83  BILITOT 0.8  PROT 7.1  ALBUMIN 3.0*   Recent Labs  Lab 07/04/19 1238  LIPASE 50   No results for input(s): AMMONIA in the last 168 hours. Coagulation Profile: No results for input(s): INR, PROTIME in the last 168 hours. Cardiac Enzymes: No results for input(s): CKTOTAL, CKMB, CKMBINDEX, TROPONINI in the last 168 hours. BNP (last 3 results) No results for input(s): PROBNP in the last 8760 hours. HbA1C: No results for input(s): HGBA1C in the last 72 hours. CBG: Recent Labs  Lab 07/04/19 1852  GLUCAP 111*   Lipid Profile: No results for input(s): CHOL, HDL, LDLCALC, TRIG, CHOLHDL, LDLDIRECT in the last 72 hours. Thyroid Function Tests: No results for input(s): TSH, T4TOTAL, FREET4, T3FREE, THYROIDAB in the last 72  hours. Anemia Panel: No results for input(s): VITAMINB12, FOLATE, FERRITIN, TIBC, IRON, RETICCTPCT in the last 72 hours. Urine analysis:    Component Value Date/Time   COLORURINE YELLOW 12/01/2017 2017   APPEARANCEUR HAZY (A) 12/01/2017 2017   LABSPEC 1.009 12/01/2017 2017   PHURINE 9.0 (H) 12/01/2017 2017   GLUCOSEU 50 (A) 12/01/2017 2017   HGBUR NEGATIVE 12/01/2017 2017   BILIRUBINUR NEGATIVE 12/01/2017 2017   KETONESUR NEGATIVE 12/01/2017 2017   PROTEINUR >=300 (A) 12/01/2017 2017   NITRITE NEGATIVE 12/01/2017 2017   LEUKOCYTESUR LARGE (A) 12/01/2017 2017   Sepsis Labs: @LABRCNTIP (procalcitonin:4,lacticidven:4) ) Recent Results (from the past 240 hour(s))  Respiratory Panel by RT PCR (Flu A&B, Covid) - Nasopharyngeal Swab     Status: None   Collection Time: 07/04/19 11:43 PM   Specimen: Nasopharyngeal Swab  Result Value Ref Range Status   SARS Coronavirus 2 by RT PCR NEGATIVE NEGATIVE Final    Comment: (NOTE) SARS-CoV-2 target nucleic acids are NOT DETECTED. The SARS-CoV-2 RNA is generally detectable in upper respiratoy specimens during the acute phase of infection. The lowest concentration of SARS-CoV-2 viral copies this assay can detect is 131 copies/mL. A negative result does not preclude SARS-Cov-2 infection and should not be used as the sole basis for treatment or other patient management decisions. A negative result may occur with  improper specimen collection/handling, submission of specimen other than nasopharyngeal swab, presence of viral mutation(s) within the areas targeted by this assay, and inadequate number of viral copies (<131 copies/mL). A negative result must be combined with clinical observations, patient history, and epidemiological information. The expected result is Negative. Fact Sheet for Patients:  PinkCheek.be Fact Sheet for Healthcare Providers:  GravelBags.it This test is not yet ap  proved or cleared by the [place] FDA and  has been authorized for detection and/or diagnosis of SARS-CoV-2 by FDA under an Emergency Use Authorization (EUA). This EUA will remain  in effect (meaning this test can be used) for the duration of the COVID-19 declaration under Section 564(b)(1) of the Act, 21 U.S.C. section 360bbb-3(b)(1), unless the authorization is terminated or revoked sooner.    Influenza A by PCR NEGATIVE NEGATIVE Final   Influenza B by PCR NEGATIVE NEGATIVE  Final    Comment: (NOTE) The Xpert Xpress SARS-CoV-2/FLU/RSV assay is intended as an aid in  the diagnosis of influenza from Nasopharyngeal swab specimens and  should not be used as a sole basis for treatment. Nasal washings and  aspirates are unacceptable for Xpert Xpress SARS-CoV-2/FLU/RSV  testing. Fact Sheet for Patients: PinkCheek.be Fact Sheet for Healthcare Providers: GravelBags.it This test is not yet approved or cleared by the [place] FDA and  has been authorized for detection and/or diagnosis of SARS-CoV-2 by  FDA under an Emergency Use Authorization (EUA). This EUA will remain  in effect (meaning this test can be used) for the duration of the  Covid-19 declaration under Section 564(b)(1) of the Act, 21  U.S.C. section 360bbb-3(b)(1), unless the authorization is  terminated or revoked. Performed at [facility], [place] [address] [place], [place] [address]      Radiological Exams on Admission: CT ABDOMEN PELVIS W CONTRAST  Result Date: 07/04/2019 CLINICAL DATA:  50 year old female with abdominal pain, fever and leukocytosis. EXAM: CT ABDOMEN AND PELVIS WITH CONTRAST TECHNIQUE: Multidetector CT imaging of the abdomen and pelvis was performed using the standard protocol following bolus administration of intravenous contrast. CONTRAST:  145mL OMNIPAQUE IOHEXOL 300 MG/ML  SOLN COMPARISON:  CT abdomen pelvis dated 04/20/2018. FINDINGS:  Lower chest: The visualized lung bases are clear. Multi vessel coronary vascular calcifications noted. Intra-abdominal free air or free fluid. Hepatobiliary: The liver is unremarkable. No intrahepatic biliary ductal dilatation. The gallbladder is unremarkable. Pancreas: Unremarkable. No pancreatic ductal dilatation or surrounding inflammatory changes. Spleen: Normal in size without focal abnormality. Adrenals/Urinary Tract: The adrenal glands are unremarkable. There are bilateral renal cystic lesions as well as additional subcentimeter hypodense lesions which are too small to characterize. There is a 3 cm low attenuating lesion in the inferior pole of the right kidney similar to prior CT. There is slight higher attenuation of this cystic lesion likely represents a complex cyst with internal debris. There is stranding of the right inferior perinephric fat concerning for edema possibly related to cyst infection. This has slightly increased since the prior CT. Clinical correlation and further evaluation with renal ultrasound recommended. Several punctate calcific foci along the periphery of the right inferior pole cyst may represent areas of wall calcification. A 2 cm cyst in the interpolar aspect of the left kidney has a partially calcified wall appears similar to prior CT. There is no hydronephrosis on either side. Punctate nonobstructing bilateral renal calculi versus renal vascular calcification. The visualized ureters and urinary bladder appear unremarkable. Stomach/Bowel: There is sigmoid diverticulosis without active inflammatory changes. There is no bowel obstruction. The appendix is normal. Vascular/Lymphatic: Moderate aortoiliac atherosclerotic disease. The IVC is unremarkable. No portal venous gas. There is no adenopathy. Reproductive: The uterus is anteverted. Multiple uterine fibroids noted. There is a 2 cm slightly higher attenuating lesion in the right ovary which is not well characterized. Pelvic  ultrasound is recommended for better evaluation on a nonemergent/outpatient basis. The left ovary is unremarkable. Other: There is stranding and edema of the subcutaneous soft tissues of the right gluteal region and right buttock. No fluid collection. Musculoskeletal: No acute or significant osseous findings. IMPRESSION: 1. Stranding of the right inferior perinephric fat concerning for edema possibly related to cyst infection or pyelonephritis. Clinical correlation and further evaluation with renal ultrasound recommended. 2. Sigmoid diverticulosis. No bowel obstruction. Normal appendix. 3. A 2 cm slightly higher attenuating lesion in the right ovary is not well characterized. Pelvic ultrasound is recommended  for better evaluation on a nonemergent/outpatient basis. 4. Right gluteal subcutaneous edema and stranding. No fluid collection. 5. Aortic Atherosclerosis (ICD10-I70.0). Electronically Signed   By: Anner Crete M.D.   On: 07/04/2019 19:53    EKG: Independently reviewed.  Sinus tachycardia.  Assessment/Plan Principal Problem:   Cellulitis of buttock Active Problems:   ESRD (end stage renal disease) (HCC)   DM2 (diabetes mellitus, type 2) (HCC)   Lupus (HCC)   CAD (coronary artery disease)   Essential hypertension    1. Possible developing sepsis from right buttock cellulitis no definite abscess at this time closely observe.  There is also concerning for possible right perinephric stranding for pyelonephritis.  Follow blood cultures on empiric antibiotics for now. 2. ESRD on hemodialysis missed dialysis last session consult nephrology for dialysis. 3. Abnormal ovarian lesion will need further work-up. 4. Diabetes mellitus type 2 on NovoLog 70/30.  Takes twice daily. 5. CAD status post stenting on aspirin Plavix Imdur statins and beta-blockers. 6. Hypertension on Imdur beta-blockers and amlodipine. 7. History of lupus on chronic prednisone therapy.  Since patient has septic picture on  presentation with multiple comorbidities will need close monitoring for any further deterioration in inpatient status.   DVT prophylaxis: Heparin. Code Status: Full code. Family Communication: Discussed with patient. Disposition Plan: Home. Consults called: None. Admission status: Inpatient.   Rise Patience MD [facility] Pager [phone].  If 7PM-7AM, please contact night-coverage [email or web address] Password [email or web address]  07/05/2019, 1:19 AM

## 2019-07-06 ENCOUNTER — Inpatient Hospital Stay (HOSPITAL_COMMUNITY): Payer: Medicare HMO

## 2019-07-06 DIAGNOSIS — I10 Essential (primary) hypertension: Secondary | ICD-10-CM

## 2019-07-06 DIAGNOSIS — Z794 Long term (current) use of insulin: Secondary | ICD-10-CM

## 2019-07-06 DIAGNOSIS — E119 Type 2 diabetes mellitus without complications: Secondary | ICD-10-CM

## 2019-07-06 DIAGNOSIS — N186 End stage renal disease: Secondary | ICD-10-CM

## 2019-07-06 DIAGNOSIS — N12 Tubulo-interstitial nephritis, not specified as acute or chronic: Secondary | ICD-10-CM

## 2019-07-06 LAB — CBC WITH DIFFERENTIAL/PLATELET
Abs Immature Granulocytes: 0.07 10*3/uL (ref 0.00–0.07)
Basophils Absolute: 0 10*3/uL (ref 0.0–0.1)
Basophils Relative: 0 %
Eosinophils Absolute: 0.1 10*3/uL (ref 0.0–0.5)
Eosinophils Relative: 1 %
HCT: 31.5 % — ABNORMAL LOW (ref 36.0–46.0)
Hemoglobin: 10.1 g/dL — ABNORMAL LOW (ref 12.0–15.0)
Immature Granulocytes: 1 %
Lymphocytes Relative: 9 %
Lymphs Abs: 1.1 10*3/uL (ref 0.7–4.0)
MCH: 30.7 pg (ref 26.0–34.0)
MCHC: 32.1 g/dL (ref 30.0–36.0)
MCV: 95.7 fL (ref 80.0–100.0)
Monocytes Absolute: 0.9 10*3/uL (ref 0.1–1.0)
Monocytes Relative: 8 %
Neutro Abs: 9.3 10*3/uL — ABNORMAL HIGH (ref 1.7–7.7)
Neutrophils Relative %: 81 %
Platelets: 150 10*3/uL (ref 150–400)
RBC: 3.29 MIL/uL — ABNORMAL LOW (ref 3.87–5.11)
RDW: 17.6 % — ABNORMAL HIGH (ref 11.5–15.5)
WBC: 11.5 10*3/uL — ABNORMAL HIGH (ref 4.0–10.5)
nRBC: 0 % (ref 0.0–0.2)

## 2019-07-06 LAB — COMPREHENSIVE METABOLIC PANEL
ALT: 35 U/L (ref 0–44)
AST: 95 U/L — ABNORMAL HIGH (ref 15–41)
Albumin: 2.3 g/dL — ABNORMAL LOW (ref 3.5–5.0)
Alkaline Phosphatase: 69 U/L (ref 38–126)
Anion gap: 15 (ref 5–15)
BUN: 25 mg/dL — ABNORMAL HIGH (ref 6–20)
CO2: 24 mmol/L (ref 22–32)
Calcium: 8.6 mg/dL — ABNORMAL LOW (ref 8.9–10.3)
Chloride: 95 mmol/L — ABNORMAL LOW (ref 98–111)
Creatinine, Ser: 8.16 mg/dL — ABNORMAL HIGH (ref 0.44–1.00)
GFR calc Af Amer: 6 mL/min — ABNORMAL LOW (ref >60)
GFR calc non Af Amer: 5 mL/min — ABNORMAL LOW (ref >60)
Glucose, Bld: 115 mg/dL — ABNORMAL HIGH (ref 70–99)
Potassium: 3.5 mmol/L (ref 3.5–5.1)
Sodium: 134 mmol/L — ABNORMAL LOW (ref 135–145)
Total Bilirubin: 0.7 mg/dL (ref 0.3–1.2)
Total Protein: 6.1 g/dL — ABNORMAL LOW (ref 6.5–8.1)

## 2019-07-06 LAB — GLUCOSE, CAPILLARY
Glucose-Capillary: 33 mg/dL — CL (ref 70–99)
Glucose-Capillary: 55 mg/dL — ABNORMAL LOW (ref 70–99)
Glucose-Capillary: 73 mg/dL (ref 70–99)
Glucose-Capillary: 111 mg/dL — ABNORMAL HIGH (ref 70–99)
Glucose-Capillary: 138 mg/dL — ABNORMAL HIGH (ref 70–99)
Glucose-Capillary: 184 mg/dL — ABNORMAL HIGH (ref 70–99)

## 2019-07-06 LAB — HIV ANTIBODY (ROUTINE TESTING W REFLEX): HIV Screen 4th Generation wRfx: NONREACTIVE

## 2019-07-06 LAB — PROCALCITONIN: Procalcitonin: 6.14 ng/mL

## 2019-07-06 MED ORDER — TRAMADOL HCL 50 MG PO TABS
50.0000 mg | ORAL_TABLET | Freq: Four times a day (QID) | ORAL | Status: DC | PRN
  Administered 2019-07-08 – 2019-07-10 (×5): 50 mg via ORAL
  Filled 2019-07-06 (×7): qty 1

## 2019-07-06 MED ORDER — MORPHINE SULFATE (PF) 2 MG/ML IV SOLN: 1.0000 mg | INTRAVENOUS | Status: DC | PRN

## 2019-07-06 NOTE — Progress Notes (Signed)
PROGRESS NOTE    Paula Bass  WJX:[id number] DOB: 1970-11-21 DOA: 07/04/2019 PCP: Benito Mccreedy, MD    Chief Complaint  Patient presents with  . Fever  . Diarrhea  . Weakness    Brief Narrative:   49 year old lady prior history of end-stage renal disease on dialysis TTS, hypertension, diabetes, lupus unstable angina s/p PCI, obesity presents to ED for fever and chills for 3 days was found to have cellulitis of the skin and soft tissue of the buttocks and pyelonephritis.  Assessment & Plan:   Principal Problem:   Cellulitis of buttock Active Problems:   ESRD (end stage renal disease) (HCC)   DM2 (diabetes mellitus, type 2) (HCC)   Lupus (HCC)   CAD (coronary artery disease)   Essential hypertension   Cellulitis of the Right Buttock: - improving, fever curve has improved, but still has chills.  - resume broad spectrum IV antibiotics.  - pain control with tramadol and IV morphine.     ESRD on HD Nephrology consulted and recommendations given HD on TTS.    Type 2 DM:  CBG (last 3)  Recent Labs    07/06/19 0938 07/06/19 1251 07/06/19 1636  GLUCAP 73 111* 138*   Resume SSI. Hold the novolog 70/30 today as her cbg's have been low in the last 24 hours.   Essential Hypertension: Well controlled. Resume Imdur and Norvasc.    Hyperlipidemia:  Resume Lipitor.       DVT prophylaxis: scd's Code Status: full code.  Family Communication: family at bedside.  Disposition:   Status is: Inpatient  Remains inpatient appropriate because:IV treatments appropriate due to intensity of illness or inability to take PO   Dispo: The patient is from: Home              Anticipated d/c is to: pending              Anticipated d/c date is: 3 days              Patient currently is not medically stable to d/c.        Consultants:   Nephrology.    Procedures: HD session  Antimicrobials: IV vancomycin and IV meropenam since admission.    Subjective: Reports worsening pain.requesting for pain meds.   Objective: Vitals:   07/05/19 2114 07/06/19 0108 07/06/19 0558 07/06/19 0940  BP: 126/67 119/62 121/72 127/83  Pulse: 95 88 92 95  Resp: 18 20 18 20   Temp: 100.2 F (37.9 C) 99.5 F (37.5 C) 99.2 F (37.3 C) 98 F (36.7 C)  TempSrc: Oral Oral Oral Oral  SpO2: 98% 94% 100% 100%  Weight: 69.9 kg     Height:        Intake/Output Summary (Last 24 hours) at 07/06/2019 1629 Last data filed at 07/06/2019 1303 Gross per 24 hour  Intake 1090 ml  Output 500 ml  Net 590 ml   Filed Weights   07/05/19 1324 07/05/19 1708 07/05/19 2114  Weight: 70.3 kg 70 kg 69.9 kg    Examination:  General exam: well developed and in mild distress from pain in the right buttock Respiratory system: Clear to auscultation. Respiratory effort normal. Cardiovascular system: S1 & S2 heard, RRR. No JVD, No pedal edema. Gastrointestinal system: Abdomen is nondistended, soft and nontender.  Normal bowel sounds heard. Central nervous system: Alert and oriented. No focal neurological deficits. Extremities: Symmetric 5 x 5 power. Skin: redness, swollen right buttocks. Psychiatry: Mood & affect appropriate.  Data Reviewed: I have personally reviewed following labs and imaging studies  CBC: Recent Labs  Lab 07/04/19 1238 07/06/19 1122  WBC 17.7* 11.5*  NEUTROABS  --  9.3*  HGB 12.0 10.1*  HCT 38.4 31.5*  MCV 98.0 95.7  PLT 162 409    Basic Metabolic Panel: Recent Labs  Lab 07/04/19 1238 07/06/19 1122  NA 134* 134*  K 4.4 3.5  CL 94* 95*  CO2 22 24  GLUCOSE 120* 115*  BUN 63* 25*  CREATININE 14.61* 8.16*  CALCIUM 9.1 8.6*    GFR: Estimated Creatinine Clearance: 7.6 mL/min (A) (by C-G formula based on SCr of 8.16 mg/dL (H)).  Liver Function Tests: Recent Labs  Lab 07/04/19 1238 07/06/19 1122  AST 53* 95*  ALT 18 35  ALKPHOS 83 69  BILITOT 0.8 0.7  PROT 7.1 6.1*  ALBUMIN 3.0* 2.3*    CBG: Recent Labs  Lab  07/05/19 2230 07/06/19 0717 07/06/19 0803 07/06/19 0938 07/06/19 1251  GLUCAP 83 33* 55* 73 111*     Recent Results (from the past 240 hour(s))  Respiratory Panel by RT PCR (Flu A&B, Covid) - Nasopharyngeal Swab     Status: None   Collection Time: 07/04/19 11:43 PM   Specimen: Nasopharyngeal Swab  Result Value Ref Range Status   SARS Coronavirus 2 by RT PCR NEGATIVE NEGATIVE Final    Comment: (NOTE) SARS-CoV-2 target nucleic acids are NOT DETECTED. The SARS-CoV-2 RNA is generally detectable in upper respiratoy specimens during the acute phase of infection. The lowest concentration of SARS-CoV-2 viral copies this assay can detect is 131 copies/mL. A negative result does not preclude SARS-Cov-2 infection and should not be used as the sole basis for treatment or other patient management decisions. A negative result may occur with  improper specimen collection/handling, submission of specimen other than nasopharyngeal swab, presence of viral mutation(s) within the areas targeted by this assay, and inadequate number of viral copies (<131 copies/mL). A negative result must be combined with clinical observations, patient history, and epidemiological information. The expected result is Negative. Fact Sheet for Patients:  PinkCheek.be Fact Sheet for Healthcare Providers:  GravelBags.it This test is not yet ap proved or cleared by the [place] FDA and  has been authorized for detection and/or diagnosis of SARS-CoV-2 by FDA under an Emergency Use Authorization (EUA). This EUA will remain  in effect (meaning this test can be used) for the duration of the COVID-19 declaration under Section 564(b)(1) of the Act, 21 U.S.C. section 360bbb-3(b)(1), unless the authorization is terminated or revoked sooner.    Influenza A by PCR NEGATIVE NEGATIVE Final   Influenza B by PCR NEGATIVE NEGATIVE Final    Comment: (NOTE) The Xpert  Xpress SARS-CoV-2/FLU/RSV assay is intended as an aid in  the diagnosis of influenza from Nasopharyngeal swab specimens and  should not be used as a sole basis for treatment. Nasal washings and  aspirates are unacceptable for Xpert Xpress SARS-CoV-2/FLU/RSV  testing. Fact Sheet for Patients: PinkCheek.be Fact Sheet for Healthcare Providers: GravelBags.it This test is not yet approved or cleared by the [place] FDA and  has been authorized for detection and/or diagnosis of SARS-CoV-2 by  FDA under an Emergency Use Authorization (EUA). This EUA will remain  in effect (meaning this test can be used) for the duration of the  Covid-19 declaration under Section 564(b)(1) of the Act, 21  U.S.C. section 360bbb-3(b)(1), unless the authorization is  terminated or revoked. Performed at [facility], [place]  [address], [place], [place] [address]   MRSA PCR Screening     Status: Abnormal   Collection Time: 07/05/19 12:45 AM   Specimen: Nasal Mucosa; Nasopharyngeal  Result Value Ref Range Status   MRSA by PCR POSITIVE (A) NEGATIVE Final    Comment:        The GeneXpert MRSA Assay (FDA approved for NASAL specimens only), is one component of a comprehensive MRSA colonization surveillance program. It is not intended to diagnose MRSA infection nor to guide or monitor treatment for MRSA infections. RESULT CALLED TO, READ BACK BY AND VERIFIED WITHLendell Caprice RN 07/05/19 0347 JDW Performed at [facility] [address] [place], [place] [address]   Culture, blood (single) w Reflex to ID Panel     Status: None (Preliminary result)   Collection Time: 07/05/19 12:59 AM   Specimen: BLOOD  Result Value Ref Range Status   Specimen Description BLOOD SITE NOT SPECIFIED  Final   Special Requests   Final    BOTTLES DRAWN AEROBIC ONLY Blood Culture adequate volume   Culture   Final    NO GROWTH 1 DAY Performed at [facility], [place] [address] [place], [place] [address]    Report Status PENDING  Incomplete         Radiology Studies: CT ABDOMEN PELVIS W CONTRAST  Result Date: 07/04/2019 CLINICAL DATA:  49 year old female with abdominal pain, fever and leukocytosis. EXAM: CT ABDOMEN AND PELVIS WITH CONTRAST TECHNIQUE: Multidetector CT imaging of the abdomen and pelvis was performed using the standard protocol following bolus administration of intravenous contrast. CONTRAST:  124mL OMNIPAQUE IOHEXOL 300 MG/ML  SOLN COMPARISON:  CT abdomen pelvis dated 04/20/2018. FINDINGS: Lower chest: The visualized lung bases are clear. Multi vessel coronary vascular calcifications noted. Intra-abdominal free air or free fluid. Hepatobiliary: The liver is unremarkable. No intrahepatic biliary ductal dilatation. The gallbladder is unremarkable. Pancreas: Unremarkable. No pancreatic ductal dilatation or surrounding inflammatory changes. Spleen: Normal in size without focal abnormality. Adrenals/Urinary Tract: The adrenal glands are unremarkable. There are bilateral renal cystic lesions as well as additional subcentimeter hypodense lesions which are too small to characterize. There is a 3 cm low attenuating lesion in the inferior pole of the right kidney similar to prior CT. There is slight higher attenuation of this cystic lesion likely represents a complex cyst with internal debris. There is stranding of the right inferior perinephric fat concerning for edema possibly related to cyst infection. This has slightly increased since the prior CT. Clinical correlation and further evaluation with renal ultrasound recommended. Several punctate calcific foci along the periphery of the right inferior pole cyst may represent areas of wall calcification. A 2 cm cyst in the interpolar aspect of the left kidney has a partially calcified wall appears similar to prior CT. There is no hydronephrosis on either side. Punctate nonobstructing bilateral renal  calculi versus renal vascular calcification. The visualized ureters and urinary bladder appear unremarkable. Stomach/Bowel: There is sigmoid diverticulosis without active inflammatory changes. There is no bowel obstruction. The appendix is normal. Vascular/Lymphatic: Moderate aortoiliac atherosclerotic disease. The IVC is unremarkable. No portal venous gas. There is no adenopathy. Reproductive: The uterus is anteverted. Multiple uterine fibroids noted. There is a 2 cm slightly higher attenuating lesion in the right ovary which is not well characterized. Pelvic ultrasound is recommended for better evaluation on a nonemergent/outpatient basis. The left ovary is unremarkable. Other: There is stranding and edema of the subcutaneous soft tissues of the right gluteal  region and right buttock. No fluid collection. Musculoskeletal: No acute or significant osseous findings. IMPRESSION: 1. Stranding of the right inferior perinephric fat concerning for edema possibly related to cyst infection or pyelonephritis. Clinical correlation and further evaluation with renal ultrasound recommended. 2. Sigmoid diverticulosis. No bowel obstruction. Normal appendix. 3. A 2 cm slightly higher attenuating lesion in the right ovary is not well characterized. Pelvic ultrasound is recommended for better evaluation on a nonemergent/outpatient basis. 4. Right gluteal subcutaneous edema and stranding. No fluid collection. 5. Aortic Atherosclerosis (ICD10-I70.0). Electronically Signed   By: Anner Crete M.D.   On: 07/04/2019 19:53        Scheduled Meds: . acidophilus  1 capsule Oral Daily  . amLODipine  10 mg Oral Daily  . aspirin EC  81 mg Oral Daily  . atorvastatin  40 mg Oral Daily  . Chlorhexidine Gluconate Cloth  6 each Topical Q0600  . Chlorhexidine Gluconate Cloth  6 each Topical Q0600  . clopidogrel  75 mg Oral Daily  . heparin  5,000 Units Subcutaneous Q8H  . insulin aspart  0-6 Units Subcutaneous TID WC  . isosorbide  mononitrate  30 mg Oral Daily  . metoprolol tartrate  25 mg Oral BID  . multivitamin  1 tablet Oral QHS  . mupirocin ointment  1 application Nasal BID  . pantoprazole  40 mg Oral Daily  . predniSONE  5 mg Oral Q breakfast   Continuous Infusions: . meropenem (MERREM) IV 500 mg (07/05/19 2135)  . vancomycin 750 mg (07/05/19 1601)     LOS: 1 day        Hosie Poisson, MD [facility]   To contact the attending provider between 7A-7P or the covering provider during after hours 7P-7A, please log into the web site [email or web address] and access using universal [place] password for that web site. If you do not have the password, please call the hospital operator.  07/06/2019, 4:29 PM

## 2019-07-06 NOTE — Progress Notes (Signed)
[facility] Progress Note   Subjective:  Patient seen and examined at bedside.  Tearful on exam.  Buttock hurts and feeling very weak when trying to stand/walk.  Provided support.  Denies chills, SOB, CP, n/v/d, abdominal pain and fatigue.    Objective Vitals:   07/05/19 2114 07/06/19 0108 07/06/19 0558 07/06/19 0940  BP: 126/67 119/62 121/72 127/83  Pulse: 95 88 92 95  Resp: 18 20 18 20   Temp: 100.2 F (37.9 C) 99.5 F (37.5 C) 99.2 F (37.3 C) 98 F (36.7 C)  TempSrc: Oral Oral Oral Oral  SpO2: 98% 94% 100% 100%  Weight: 69.9 kg     Height:       Physical Exam General:pleasant, obese female in NAD Heart:RRR Lungs:CTAB Abdomen:soft, NTND Extremities:no LE edema Dialysis Access: LU AVG +b   Filed Weights   07/05/19 1324 07/05/19 1708 07/05/19 2114  Weight: 70.3 kg 70 kg 69.9 kg    Intake/Output Summary (Last 24 hours) at 07/06/2019 1130 Last data filed at 07/06/2019 0800 Gross per 24 hour  Intake 1330 ml  Output 500 ml  Net 830 ml    Additional Objective Labs: Basic Metabolic Panel: Recent Labs  Lab 07/04/19 1238  NA 134*  K 4.4  CL 94*  CO2 22  GLUCOSE 120*  BUN 63*  CREATININE 14.61*  CALCIUM 9.1   Liver Function Tests: Recent Labs  Lab 07/04/19 1238  AST 53*  ALT 18  ALKPHOS 83  BILITOT 0.8  PROT 7.1  ALBUMIN 3.0*   Recent Labs  Lab 07/04/19 1238  LIPASE 50   CBC: Recent Labs  Lab 07/04/19 1238  WBC 17.7*  HGB 12.0  HCT 38.4  MCV 98.0  PLT 162   Blood Culture    Component Value Date/Time   SDES BLOOD SITE NOT SPECIFIED 07/05/2019 0059   SPECREQUEST  07/05/2019 0059    BOTTLES DRAWN AEROBIC ONLY Blood Culture adequate volume   CULT  07/05/2019 0059    NO GROWTH 1 DAY Performed at [facility], [place] [address], [place], [place] [address]    REPTSTATUS PENDING 07/05/2019 1324    Cardiac Enzymes: No results for input(s): CKTOTAL, CKMB, CKMBINDEX, TROPONINI in the last 168 hours. CBG: Recent Labs  Lab  07/05/19 2122 07/05/19 2230 07/06/19 0717 07/06/19 0803 07/06/19 0938  GLUCAP 58* 83 33* 55* 73   Iron Studies: No results for input(s): IRON, TIBC, TRANSFERRIN, FERRITIN in the last 72 hours. Lab Results  Component Value Date   INR 1.20 12/15/2017   Studies/Results: CT ABDOMEN PELVIS W CONTRAST  Result Date: 07/04/2019 CLINICAL DATA:  49 year old female with abdominal pain, fever and leukocytosis. EXAM: CT ABDOMEN AND PELVIS WITH CONTRAST TECHNIQUE: Multidetector CT imaging of the abdomen and pelvis was performed using the standard protocol following bolus administration of intravenous contrast. CONTRAST:  159mL OMNIPAQUE IOHEXOL 300 MG/ML  SOLN COMPARISON:  CT abdomen pelvis dated 04/20/2018. FINDINGS: Lower chest: The visualized lung bases are clear. Multi vessel coronary vascular calcifications noted. Intra-abdominal free air or free fluid. Hepatobiliary: The liver is unremarkable. No intrahepatic biliary ductal dilatation. The gallbladder is unremarkable. Pancreas: Unremarkable. No pancreatic ductal dilatation or surrounding inflammatory changes. Spleen: Normal in size without focal abnormality. Adrenals/Urinary Tract: The adrenal glands are unremarkable. There are bilateral renal cystic lesions as well as additional subcentimeter hypodense lesions which are too small to characterize. There is a 3 cm low attenuating lesion in the inferior pole of the right kidney similar to prior CT. There  is slight higher attenuation of this cystic lesion likely represents a complex cyst with internal debris. There is stranding of the right inferior perinephric fat concerning for edema possibly related to cyst infection. This has slightly increased since the prior CT. Clinical correlation and further evaluation with renal ultrasound recommended. Several punctate calcific foci along the periphery of the right inferior pole cyst may represent areas of wall calcification. A 2 cm cyst in the interpolar aspect of the  left kidney has a partially calcified wall appears similar to prior CT. There is no hydronephrosis on either side. Punctate nonobstructing bilateral renal calculi versus renal vascular calcification. The visualized ureters and urinary bladder appear unremarkable. Stomach/Bowel: There is sigmoid diverticulosis without active inflammatory changes. There is no bowel obstruction. The appendix is normal. Vascular/Lymphatic: Moderate aortoiliac atherosclerotic disease. The IVC is unremarkable. No portal venous gas. There is no adenopathy. Reproductive: The uterus is anteverted. Multiple uterine fibroids noted. There is a 2 cm slightly higher attenuating lesion in the right ovary which is not well characterized. Pelvic ultrasound is recommended for better evaluation on a nonemergent/outpatient basis. The left ovary is unremarkable. Other: There is stranding and edema of the subcutaneous soft tissues of the right gluteal region and right buttock. No fluid collection. Musculoskeletal: No acute or significant osseous findings. IMPRESSION: 1. Stranding of the right inferior perinephric fat concerning for edema possibly related to cyst infection or pyelonephritis. Clinical correlation and further evaluation with renal ultrasound recommended. 2. Sigmoid diverticulosis. No bowel obstruction. Normal appendix. 3. A 2 cm slightly higher attenuating lesion in the right ovary is not well characterized. Pelvic ultrasound is recommended for better evaluation on a nonemergent/outpatient basis. 4. Right gluteal subcutaneous edema and stranding. No fluid collection. 5. Aortic Atherosclerosis (ICD10-I70.0). Electronically Signed   By: Anner Crete M.D.   On: 07/04/2019 19:53    Medications: . meropenem (MERREM) IV 500 mg (07/05/19 2135)  . vancomycin 750 mg (07/05/19 1601)   . acidophilus  1 capsule Oral Daily  . amLODipine  10 mg Oral Daily  . aspirin EC  81 mg Oral Daily  . atorvastatin  40 mg Oral Daily  . Chlorhexidine  Gluconate Cloth  6 each Topical Q0600  . Chlorhexidine Gluconate Cloth  6 each Topical Q0600  . clopidogrel  75 mg Oral Daily  . heparin  5,000 Units Subcutaneous Q8H  . insulin aspart  0-6 Units Subcutaneous TID WC  . isosorbide mononitrate  30 mg Oral Daily  . metoprolol tartrate  25 mg Oral BID  . multivitamin  1 tablet Oral QHS  . mupirocin ointment  1 application Nasal BID  . pantoprazole  40 mg Oral Daily  . predniSONE  5 mg Oral Q breakfast    Dialysis Orders: T[facility] - [facility]  3.25hrs, BFR 400, DFR 800,  EDW 69.5kg, 2K/ 2.25Ca  Access: LU AVG  Heparin none Mircera 50 mcg q4wks - last 4/27 Hectorol 42mcg IV qHD   Assessment/Plan: 1.  Fever/chills - CT showed likely cellulitis of R buttock and possible pyelonephritis vs infected cyst.  Renal US recommended for further determination.  BC NGTD. UA ordered. Vanc ordered.   2. Abnormal ovarian lesion - incidental findings on CT. will need additional workup. Per primary 3.  ESRD -  On HD TTS. Last K 4.4. HD yesterday per regular schedule.  Next HD on 5/11. 4.  Hypertension/volume  - BP well controlled.  Does not appear volume overloaded.   5.  Anemia of CKD - last Hgb 12.0.  No indication for ESA at this time.  6.  Secondary Hyperparathyroidism -  Ca at goal. Will check phos.  Continue VDRA and binders.   7.  Nutrition - Renal diet w/fluid restrictions.  8. Lupus - on chronic prednisone 9. CAD s/p stenting - per primary 10. DMT2 - on insulin, per primary  Jen Mow, PA-C [facility] Pager: [phone] 07/06/2019,11:30 AM  LOS: 1 day

## 2019-07-06 NOTE — Progress Notes (Signed)
Pt refusing labs at this time. Stated she would later, but not now.   Eleanora Neighbor, RN

## 2019-07-06 NOTE — Plan of Care (Signed)
  Problem: Clinical Measurements: Goal: Complications related to the disease process, condition or treatment will be avoided or minimized Outcome: Progressing   

## 2019-07-07 LAB — PROCALCITONIN: Procalcitonin: 5.41 ng/mL

## 2019-07-07 LAB — GLUCOSE, CAPILLARY
Glucose-Capillary: 98 mg/dL (ref 70–99)
Glucose-Capillary: 139 mg/dL — ABNORMAL HIGH (ref 70–99)
Glucose-Capillary: 221 mg/dL — ABNORMAL HIGH (ref 70–99)
Glucose-Capillary: 135 mg/dL — ABNORMAL HIGH (ref 70–99)

## 2019-07-07 MED ORDER — FLUCONAZOLE 100MG IVPB
100.0000 mg | Freq: Once | INTRAVENOUS | Status: AC
  Administered 2019-07-07: 100 mg via INTRAVENOUS
  Filled 2019-07-07: qty 50

## 2019-07-07 MED ORDER — NYSTATIN 100000 UNIT/GM EX POWD
1.0000 "application " | Freq: Two times a day (BID) | CUTANEOUS | Status: DC
  Administered 2019-07-07 – 2019-07-11 (×9): 1 via TOPICAL
  Filled 2019-07-07: qty 15

## 2019-07-07 MED ORDER — NEPRO/CARBSTEADY PO LIQD
237.0000 mL | Freq: Two times a day (BID) | ORAL | Status: DC
  Administered 2019-07-10 (×2): 237 mL via ORAL

## 2019-07-07 MED ORDER — INSULIN ASPART PROT & ASPART (70-30 MIX) 100 UNIT/ML ~~LOC~~ SUSP
20.0000 [IU] | Freq: Two times a day (BID) | SUBCUTANEOUS | Status: DC
  Administered 2019-07-07 – 2019-07-08 (×2): 20 [IU] via SUBCUTANEOUS
  Filled 2019-07-07: qty 10

## 2019-07-07 MED ORDER — PRO-STAT SUGAR FREE PO LIQD
30.0000 mL | Freq: Two times a day (BID) | ORAL | Status: DC
  Administered 2019-07-07 – 2019-07-11 (×7): 30 mL via ORAL
  Filled 2019-07-07 (×8): qty 30

## 2019-07-07 NOTE — Progress Notes (Signed)
Initial Nutrition Assessment  RD working remotely.  DOCUMENTATION CODES:   Not applicable  INTERVENTION:   - Continue renal MVI daily  - Nepro Shake po BID, each supplement provides 425 kcal and 19 grams protein  - Pro-stat 30 ml po BID, each supplement provides 100 kcal and 15 grams protein  NUTRITION DIAGNOSIS:   Increased nutrient needs related to acute illness, chronic illness (cellulitis, ESRD on HD) as evidenced by estimated needs.  GOAL:   Patient will meet greater than or equal to 90% of their needs  MONITOR:   PO intake, Supplement acceptance, Labs, Weight trends, Skin, I & O's  REASON FOR ASSESSMENT:   Malnutrition Screening Tool    ASSESSMENT:   49 year old female who presented on 5/07 with N/V/D and right buttock pain. PMH of ESRD on HD, lupus on prednisone, CAD s/p stenting on plavix, T2DM. CT abdomen/pelvis showing features concerning for right perinephric stranding right ovarian lesion and also cellulitis of right buttocks. Admitted with possible sepsis.   Last HD on 5/08 with 500 ml net UF. Post-HD weight was 70 kg. Net HD planned for tomorrow, 5/11.  EDW: 69.5 kg  Reviewed weight history in chart. Pt with a 3.5 kg weight loss since 03/10/19. This is a 4.8% weight loss in 3 months which is not significant for timeframe. Suspect pt's weight is fluctuating related to HD treatments.  RD was unable to reach pt via phone call to room. Given PO intake of 25-50% at meals, will order oral nutrition supplements to aid pt in meeting kcal and protein needs.  Meal Completion: 25-50%  Medications reviewed and include: SSI, rena-vit, protonix, prednisone, IV abx  Labs reviewed: sodium 134, chloride 95 CBG's: 98-184 x 24 hours  NUTRITION - FOCUSED PHYSICAL EXAM:  Unable to complete at this time. RD working remotely.  Diet Order:   Diet Order            Diet renal/carb modified with fluid restriction Diet-HS Snack? Nothing; Fluid restriction: 1200 mL Fluid;  Room service appropriate? Yes; Fluid consistency: Thin  Diet effective now              EDUCATION NEEDS:   No education needs have been identified at this time  Skin:  Skin Assessment: Skin Integrity Issues: Other: cellulitis right buttocks  Last BM:  07/07/19  Height:   Ht Readings from Last 1 Encounters:  07/04/19 5\' 1"  (1.549 m)    Weight:   Wt Readings from Last 1 Encounters:  07/06/19 69.5 kg    Ideal Body Weight:  47.7 kg  BMI:  Body mass index is 28.95 kg/m.  Estimated Nutritional Needs:   Kcal:  9937-1696  Protein:  85-100 grams  Fluid:  1000 ml + UOP    Gaynell Face, MS, RD, LDN Inpatient Clinical Dietitian Pager: [phone] Weekend/After Hours: [phone]

## 2019-07-07 NOTE — Progress Notes (Signed)
[facility] Progress Note   Subjective:  Feeling better.  Tmax 99.5 yesterday.   Objective Vitals:   07/06/19 1641 07/06/19 2052 07/07/19 0533 07/07/19 0930  BP: (!) 112/58 122/68 135/61 122/73  Pulse: 90 88 88 84  Resp: 20 16 18 20   Temp: 99.3 F (37.4 C) 99.5 F (37.5 C) 98.6 F (37 C) 98 F (36.7 C)  TempSrc: Oral Oral Oral Oral  SpO2: 100% 98% 99% 98%  Weight:  69.5 kg    Height:       Physical Exam General: NAD Heart:RRR Lungs:CTAB Abdomen:soft, NTND Extremities:no LE edema Dialysis Access: LU AVG +T/B   Filed Weights   07/05/19 1708 07/05/19 2114 07/06/19 2052  Weight: 70 kg 69.9 kg 69.5 kg    Intake/Output Summary (Last 24 hours) at 07/07/2019 1237 Last data filed at 07/07/2019 0600 Gross per 24 hour  Intake 240 ml  Output 0 ml  Net 240 ml    Additional Objective Labs: Basic Metabolic Panel: Recent Labs  Lab 07/04/19 1238 07/06/19 1122  NA 134* 134*  K 4.4 3.5  CL 94* 95*  CO2 22 24  GLUCOSE 120* 115*  BUN 63* 25*  CREATININE 14.61* 8.16*  CALCIUM 9.1 8.6*   Liver Function Tests: Recent Labs  Lab 07/04/19 1238 07/06/19 1122  AST 53* 95*  ALT 18 35  ALKPHOS 83 69  BILITOT 0.8 0.7  PROT 7.1 6.1*  ALBUMIN 3.0* 2.3*   Recent Labs  Lab 07/04/19 1238  LIPASE 50   CBC: Recent Labs  Lab 07/04/19 1238 07/06/19 1122  WBC 17.7* 11.5*  NEUTROABS  --  9.3*  HGB 12.0 10.1*  HCT 38.4 31.5*  MCV 98.0 95.7  PLT 162 150   Blood Culture    Component Value Date/Time   SDES BLOOD RIGHT HAND 07/06/2019 1134   SPECREQUEST  07/06/2019 1134    BOTTLES DRAWN AEROBIC AND ANAEROBIC Blood Culture adequate volume   CULT  07/06/2019 1134    NO GROWTH < 24 HOURS Performed at [facility], [place] [address], [place], [place] [address]    REPTSTATUS PENDING 07/06/2019 1134    Cardiac Enzymes: No results for input(s): CKTOTAL, CKMB, CKMBINDEX, TROPONINI in the last 168 hours. CBG: Recent Labs  Lab 07/06/19 1251  07/06/19 1636 07/06/19 2054 07/07/19 0709 07/07/19 1120  GLUCAP 111* 138* 184* 98 139*   Iron Studies: No results for input(s): IRON, TIBC, TRANSFERRIN, FERRITIN in the last 72 hours. Lab Results  Component Value Date   INR 1.20 12/15/2017   Studies/Results: US RENAL  Result Date: 07/06/2019 CLINICAL DATA:  Right pyelonephritis. EXAM: RENAL / URINARY TRACT ULTRASOUND COMPLETE COMPARISON:  None. FINDINGS: Right Kidney: Renal measurements: 10.1 x 4.8 x 5.5 cm = volume: 139 mL. Diffusely increased parenchymal echogenicity. Several small benign-appearing renal cysts are noted, largest measuring approximately 2.6 cm. No soft mass or hydronephrosis visualized. Left Kidney: Renal measurements: 6.7 x 4.9 x 4.4 cm = volume: 76 mL. Diffusely increased parenchymal echogenicity. A small benign-appearing cyst is seen which measures 2.4 cm. No mass or hydronephrosis visualized. Bladder: Bladder is nearly completely empty and not well visualized. Other: None. IMPRESSION: Diffusely increased renal parenchymal echogenicity, consistent with medical renal disease. No evidence of hydronephrosis. Small bilateral renal cysts.  No masses identified. Electronically Signed   By: Marlaine Hind M.D.   On: 07/06/2019 16:52   DG CHEST PORT 1 VIEW  Result Date: 07/06/2019 CLINICAL DATA:  Fever. EXAM: PORTABLE CHEST 1 VIEW COMPARISON:  February 12, 2018 FINDINGS: The heart size is mildly enlarged. Aortic calcifications are noted. Both lungs are clear. The visualized skeletal structures are unremarkable. There are bilateral old healed rib fractures. IMPRESSION: No active disease. Electronically Signed   By: Constance Holster M.D.   On: 07/06/2019 18:19    Medications: . meropenem (MERREM) IV 500 mg (07/06/19 2300)  . vancomycin 750 mg (07/05/19 1601)   . acidophilus  1 capsule Oral Daily  . amLODipine  10 mg Oral Daily  . aspirin EC  81 mg Oral Daily  . atorvastatin  40 mg Oral Daily  . Chlorhexidine Gluconate Cloth  6  each Topical Q0600  . Chlorhexidine Gluconate Cloth  6 each Topical Q0600  . clopidogrel  75 mg Oral Daily  . heparin  5,000 Units Subcutaneous Q8H  . insulin aspart  0-6 Units Subcutaneous TID WC  . isosorbide mononitrate  30 mg Oral Daily  . metoprolol tartrate  25 mg Oral BID  . multivitamin  1 tablet Oral QHS  . mupirocin ointment  1 application Nasal BID  . pantoprazole  40 mg Oral Daily  . predniSONE  5 mg Oral Q breakfast    Dialysis Orders: T[facility] - [place]  3.25hrs, BFR 400, DFR 800,  EDW 69.5kg, 2K/ 2.25Ca  Access: LU AVG  Heparin none Mircera 50 mcg q4wks - last 4/27 Hectorol 27mcg IV qHD   Assessment/Plan: 1.  Fever/chills - CT showed likely cellulitis of R buttock and possible pyelonephritis vs infected cyst.  Renal US 5/9--> no hydro, some benign-appearing cysts.  BC NGTD. UA ordered. On vanc/ meropenem (07/05/19-)  2. Abnormal ovarian lesion - incidental findings on CT. will need additional workup. Per primary 3.  ESRD -  On HD TTS. Last K 4.4. HD yesterday per regular schedule.  Next HD on 5/11. 4.  Hypertension/volume  - BP well controlled.  Does not appear volume overloaded.   5.  Anemia of CKD - last Hgb 12.0.  No indication for ESA at this time.  6.  Secondary Hyperparathyroidism -  Ca at goal. Will check phos.  Continue VDRA and binders.   7.  Nutrition - Renal diet w/fluid restrictions.  8. Lupus - on chronic prednisone 9. CAD s/p stenting - per primary 10. DMT2 - on insulin, per primary  Ward pgr [phone] 07/07/2019,12:37 PM  LOS: 2 days

## 2019-07-07 NOTE — Progress Notes (Signed)
PROGRESS NOTE    Paula Bass  KDT:[id number] DOB: 1970-12-20 DOA: 07/04/2019 PCP: Benito Mccreedy, MD    Chief Complaint  Patient presents with  . Fever  . Diarrhea  . Weakness    Brief Narrative:   49 year old lady prior history of end-stage renal disease on dialysis TTS, hypertension, diabetes, lupus unstable angina s/p PCI, obesity presents to ED for fever and chills for 3 days was found to have cellulitis of the skin and soft tissue of the buttocks and pyelonephritis.she was started on broad spectrum IV antibiotics and her cellulitis appear to be improving. Her fever has resolved, wbc count is improving. Recommend getting out of bed and ambulating.   Assessment & Plan:   Principal Problem:   Cellulitis of buttock Active Problems:   ESRD (end stage renal disease) (HCC)   DM2 (diabetes mellitus, type 2) (HCC)   Lupus (HCC)   CAD (coronary artery disease)   Essential hypertension   Cellulitis of the Right Buttock: - improving, no fever or chills, pain  Better controlled.  - resume broad spectrum IV antibiotics for another 24 hours, Day 3 of IV antibiotics.  - pain control with tramadol and IV morphine.     ESRD on HD Nephrology consulted and recommendations given HD on TTS.    Type 2 DM:  CBG (last 3)  Recent Labs    07/06/19 2054 07/07/19 0709 07/07/19 1120  GLUCAP 184* 98 139*  improving CBG'S novolog still on hold . But we can restart her NOVOLOG 70/30 at a lower dose.   Essential Hypertension: Well controlled.  Resume Imdur and Norvasc.    Hyperlipidemia:  Resume Lipitor.    Intertrigo under the breasts; Recommend nystatin powder and zinc oxide    Right Pyelonephritis:  CT abd shows Stranding of the right inferior perinephric fat concerning for edema possibly related to cyst infection or pyelonephritis. US renal shows Diffusely increased renal parenchymal echogenicity, consistent with medical renal disease. She denies any flank  pain at this time.   2 cm higher attenuating lesion in the right ovary, incidental finding on CT Recommend outpatient follow up with pelvic ultrasound.     DVT prophylaxis: scd's Code Status: full code.  Family Communication:none at bedside.  Disposition:   Status is: Inpatient  Remains inpatient appropriate because:IV treatments appropriate due to intensity of illness or inability to take PO   Dispo: The patient is from: Home              Anticipated d/c is to: pending              Anticipated d/c date is: 2 days              Patient currently is not medically stable to d/c.        Consultants:   Nephrology.    Procedures: HD session  Antimicrobials: IV vancomycin and IV meropenam since admission.   Subjective: Pain is better controlled today. t max is 99.5.  No chest pain or sob.  Reports  Intertrigo under her breasts.   Objective: Vitals:   07/06/19 1641 07/06/19 2052 07/07/19 0533 07/07/19 0930  BP: (!) 112/58 122/68 135/61 122/73  Pulse: 90 88 88 84  Resp: 20 16 18 20   Temp: 99.3 F (37.4 C) 99.5 F (37.5 C) 98.6 F (37 C) 98 F (36.7 C)  TempSrc: Oral Oral Oral Oral  SpO2: 100% 98% 99% 98%  Weight:  69.5 kg    Height:  Intake/Output Summary (Last 24 hours) at 07/07/2019 1528 Last data filed at 07/07/2019 0600 Gross per 24 hour  Intake 120 ml  Output 0 ml  Net 120 ml   Filed Weights   07/05/19 1708 07/05/19 2114 07/06/19 2052  Weight: 70 kg 69.9 kg 69.5 kg    Examination:  General exam: alert and comfortable.  Respiratory system:  Clear to auscultation, no wheezing or rhonchi.  Cardiovascular system: S1S2, RRR, no JVD.  Gastrointestinal system: abd is soft, non tender non distended, bowel sounds wnl.  Central nervous system: alert andoriented.  Extremities: no cyanosis or clubbing.  Skin: intertrigo under the breasts, and improving cellulitis of the buttocks.  Psychiatry: Mood is appropriate.     Data Reviewed: I have  personally reviewed following labs and imaging studies  CBC: Recent Labs  Lab 07/04/19 1238 07/06/19 1122  WBC 17.7* 11.5*  NEUTROABS  --  9.3*  HGB 12.0 10.1*  HCT 38.4 31.5*  MCV 98.0 95.7  PLT 162 076    Basic Metabolic Panel: Recent Labs  Lab 07/04/19 1238 07/06/19 1122  NA 134* 134*  K 4.4 3.5  CL 94* 95*  CO2 22 24  GLUCOSE 120* 115*  BUN 63* 25*  CREATININE 14.61* 8.16*  CALCIUM 9.1 8.6*    GFR: Estimated Creatinine Clearance: 7.6 mL/min (A) (by C-G formula based on SCr of 8.16 mg/dL (H)).  Liver Function Tests: Recent Labs  Lab 07/04/19 1238 07/06/19 1122  AST 53* 95*  ALT 18 35  ALKPHOS 83 69  BILITOT 0.8 0.7  PROT 7.1 6.1*  ALBUMIN 3.0* 2.3*    CBG: Recent Labs  Lab 07/06/19 1251 07/06/19 1636 07/06/19 2054 07/07/19 0709 07/07/19 1120  GLUCAP 111* 138* 184* 98 139*     Recent Results (from the past 240 hour(s))  Respiratory Panel by RT PCR (Flu A&B, Covid) - Nasopharyngeal Swab     Status: None   Collection Time: 07/04/19 11:43 PM   Specimen: Nasopharyngeal Swab  Result Value Ref Range Status   SARS Coronavirus 2 by RT PCR NEGATIVE NEGATIVE Final    Comment: (NOTE) SARS-CoV-2 target nucleic acids are NOT DETECTED. The SARS-CoV-2 RNA is generally detectable in upper respiratoy specimens during the acute phase of infection. The lowest concentration of SARS-CoV-2 viral copies this assay can detect is 131 copies/mL. A negative result does not preclude SARS-Cov-2 infection and should not be used as the sole basis for treatment or other patient management decisions. A negative result may occur with  improper specimen collection/handling, submission of specimen other than nasopharyngeal swab, presence of viral mutation(s) within the areas targeted by this assay, and inadequate number of viral copies (<131 copies/mL). A negative result must be combined with clinical observations, patient history, and epidemiological information.  The expected result is Negative. Fact Sheet for Patients:  PinkCheek.be Fact Sheet for Healthcare Providers:  GravelBags.it This test is not yet ap proved or cleared by the [place] FDA and  has been authorized for detection and/or diagnosis of SARS-CoV-2 by FDA under an Emergency Use Authorization (EUA). This EUA will remain  in effect (meaning this test can be used) for the duration of the COVID-19 declaration under Section 564(b)(1) of the Act, 21 U.S.C. section 360bbb-3(b)(1), unless the authorization is terminated or revoked sooner.    Influenza A by PCR NEGATIVE NEGATIVE Final   Influenza B by PCR NEGATIVE NEGATIVE Final    Comment: (NOTE) The Xpert Xpress SARS-CoV-2/FLU/RSV assay is intended as an aid in  the  diagnosis of influenza from Nasopharyngeal swab specimens and  should not be used as a sole basis for treatment. Nasal washings and  aspirates are unacceptable for Xpert Xpress SARS-CoV-2/FLU/RSV  testing. Fact Sheet for Patients: PinkCheek.be Fact Sheet for Healthcare Providers: GravelBags.it This test is not yet approved or cleared by the [place] FDA and  has been authorized for detection and/or diagnosis of SARS-CoV-2 by  FDA under an Emergency Use Authorization (EUA). This EUA will remain  in effect (meaning this test can be used) for the duration of the  Covid-19 declaration under Section 564(b)(1) of the Act, 21  U.S.C. section 360bbb-3(b)(1), unless the authorization is  terminated or revoked. Performed at [facility], [place] [address], [place], [place] [address]   MRSA PCR Screening     Status: Abnormal   Collection Time: 07/05/19 12:45 AM   Specimen: Nasal Mucosa; Nasopharyngeal  Result Value Ref Range Status   MRSA by PCR POSITIVE (A) NEGATIVE Final    Comment:        The GeneXpert MRSA Assay (FDA approved for NASAL  specimens only), is one component of a comprehensive MRSA colonization surveillance program. It is not intended to diagnose MRSA infection nor to guide or monitor treatment for MRSA infections. RESULT CALLED TO, READ BACK BY AND VERIFIED WITHLendell Caprice RN 07/05/19 0347 JDW Performed at [facility] [address] [place], [place] [address]   Culture, blood (single) w Reflex to ID Panel     Status: None (Preliminary result)   Collection Time: 07/05/19 12:59 AM   Specimen: BLOOD  Result Value Ref Range Status   Specimen Description BLOOD SITE NOT SPECIFIED  Final   Special Requests   Final    BOTTLES DRAWN AEROBIC ONLY Blood Culture adequate volume   Culture   Final    NO GROWTH 2 DAYS Performed at [facility], [address], [place], [place] [address]    Report Status PENDING  Incomplete  Culture, blood (Routine X 2) w Reflex to ID Panel     Status: None (Preliminary result)   Collection Time: 07/06/19 11:29 AM   Specimen: BLOOD  Result Value Ref Range Status   Specimen Description BLOOD RIGHT ANTECUBITAL  Final   Special Requests   Final    BOTTLES DRAWN AEROBIC AND ANAEROBIC Blood Culture adequate volume   Culture   Final    NO GROWTH < 24 HOURS Performed at [facility], [place] [address] [place], [place] [address]    Report Status PENDING  Incomplete  Culture, blood (Routine X 2) w Reflex to ID Panel     Status: None (Preliminary result)   Collection Time: 07/06/19 11:34 AM   Specimen: BLOOD RIGHT HAND  Result Value Ref Range Status   Specimen Description BLOOD RIGHT HAND  Final   Special Requests   Final    BOTTLES DRAWN AEROBIC AND ANAEROBIC Blood Culture adequate volume   Culture   Final    NO GROWTH < 24 HOURS Performed at [facility], [place] [address], [place], [place] [address]    Report Status PENDING  Incomplete         Radiology Studies: US RENAL  Result Date: 07/06/2019 CLINICAL DATA:  Right pyelonephritis. EXAM: RENAL /  URINARY TRACT ULTRASOUND COMPLETE COMPARISON:  None. FINDINGS: Right Kidney: Renal measurements: 10.1 x 4.8 x 5.5 cm = volume: 139 mL. Diffusely increased parenchymal echogenicity. Several small benign-appearing renal cysts are noted,  largest measuring approximately 2.6 cm. No soft mass or hydronephrosis visualized. Left Kidney: Renal measurements: 6.7 x 4.9 x 4.4 cm = volume: 76 mL. Diffusely increased parenchymal echogenicity. A small benign-appearing cyst is seen which measures 2.4 cm. No mass or hydronephrosis visualized. Bladder: Bladder is nearly completely empty and not well visualized. Other: None. IMPRESSION: Diffusely increased renal parenchymal echogenicity, consistent with medical renal disease. No evidence of hydronephrosis. Small bilateral renal cysts.  No masses identified. Electronically Signed   By: Marlaine Hind M.D.   On: 07/06/2019 16:52   DG CHEST PORT 1 VIEW  Result Date: 07/06/2019 CLINICAL DATA:  Fever. EXAM: PORTABLE CHEST 1 VIEW COMPARISON:  February 12, 2018 FINDINGS: The heart size is mildly enlarged. Aortic calcifications are noted. Both lungs are clear. The visualized skeletal structures are unremarkable. There are bilateral old healed rib fractures. IMPRESSION: No active disease. Electronically Signed   By: Constance Holster M.D.   On: 07/06/2019 18:19        Scheduled Meds: . acidophilus  1 capsule Oral Daily  . amLODipine  10 mg Oral Daily  . aspirin EC  81 mg Oral Daily  . atorvastatin  40 mg Oral Daily  . Chlorhexidine Gluconate Cloth  6 each Topical Q0600  . Chlorhexidine Gluconate Cloth  6 each Topical Q0600  . clopidogrel  75 mg Oral Daily  . heparin  5,000 Units Subcutaneous Q8H  . insulin aspart  0-6 Units Subcutaneous TID WC  . isosorbide mononitrate  30 mg Oral Daily  . metoprolol tartrate  25 mg Oral BID  . multivitamin  1 tablet Oral QHS  . mupirocin ointment  1 application Nasal BID  . pantoprazole  40 mg Oral Daily  . predniSONE  5 mg Oral Q  breakfast   Continuous Infusions: . meropenem (MERREM) IV 500 mg (07/06/19 2300)  . vancomycin 750 mg (07/05/19 1601)     LOS: 2 days        Hosie Poisson, MD [facility]   To contact the attending provider between 7A-7P or the covering provider during after hours 7P-7A, please log into the web site [email or web address] and access using universal [place] password for that web site. If you do not have the password, please call the hospital operator.  07/07/2019, 3:28 PM

## 2019-07-08 ENCOUNTER — Encounter (HOSPITAL_COMMUNITY): Payer: Self-pay | Admitting: Internal Medicine

## 2019-07-08 LAB — GLUCOSE, CAPILLARY
Glucose-Capillary: 82 mg/dL (ref 70–99)
Glucose-Capillary: 65 mg/dL — ABNORMAL LOW (ref 70–99)
Glucose-Capillary: 110 mg/dL — ABNORMAL HIGH (ref 70–99)
Glucose-Capillary: 109 mg/dL — ABNORMAL HIGH (ref 70–99)
Glucose-Capillary: 210 mg/dL — ABNORMAL HIGH (ref 70–99)

## 2019-07-08 LAB — SURGICAL PCR SCREEN
MRSA, PCR: NEGATIVE
Staphylococcus aureus: POSITIVE — AB

## 2019-07-08 LAB — HEMOGLOBIN A1C
Hgb A1c MFr Bld: 7.7 % — ABNORMAL HIGH (ref 4.8–5.6)
Mean Plasma Glucose: 174.29 mg/dL

## 2019-07-08 LAB — PHOSPHORUS: Phosphorus: 4.9 mg/dL — ABNORMAL HIGH (ref 2.5–4.6)

## 2019-07-08 LAB — PROCALCITONIN: Procalcitonin: 4.44 ng/mL

## 2019-07-08 MED ORDER — HEPARIN SODIUM (PORCINE) 5000 UNIT/ML IJ SOLN: 5000.0000 [IU] | Freq: Three times a day (TID) | INTRAMUSCULAR | Status: DC

## 2019-07-08 MED ORDER — DARBEPOETIN ALFA 60 MCG/0.3ML IJ SOSY: 60.0000 ug | PREFILLED_SYRINGE | INTRAMUSCULAR | Status: DC

## 2019-07-08 MED ORDER — INSULIN ASPART PROT & ASPART (70-30 MIX) 100 UNIT/ML ~~LOC~~ SUSP
10.0000 [IU] | Freq: Two times a day (BID) | SUBCUTANEOUS | Status: DC
  Administered 2019-07-09 – 2019-07-11 (×4): 10 [IU] via SUBCUTANEOUS
  Filled 2019-07-08 (×2): qty 10

## 2019-07-08 NOTE — Progress Notes (Signed)
[facility] Progress Note   Subjective:   Patient seen and examined at bedside.  Continues to have pain in R buttock with waxing and waning severity.  Afebrile overnight.  Denies fever, chills, CP, SOB, n/v/d, abdominal pain, edema and dizziness.   Objective Vitals:   07/07/19 1620 07/07/19 2126 07/08/19 0500 07/08/19 0909  BP: 113/60 130/62 128/70 134/80  Pulse: 60 86 81 82  Resp: 18 18 16 18   Temp:  98.4 F (36.9 C) 98.5 F (36.9 C)   TempSrc:  Oral Oral   SpO2: 100% 97% 95% 100%  Weight:  69.2 kg    Height:       Physical Exam General:NAD, WD, obese female in NAD Heart:RRR Lungs:CTAB Abdomen:soft, NTND Extremities:no LE edema Dialysis Access: LU AVG +b   Filed Weights   07/05/19 2114 07/06/19 2052 07/07/19 2126  Weight: 69.9 kg 69.5 kg 69.2 kg    Intake/Output Summary (Last 24 hours) at 07/08/2019 1115 Last data filed at 07/08/2019 0522 Gross per 24 hour  Intake 460 ml  Output 0 ml  Net 460 ml    Additional Objective Labs: Basic Metabolic Panel: Recent Labs  Lab 07/04/19 1238 07/06/19 1122  NA 134* 134*  K 4.4 3.5  CL 94* 95*  CO2 22 24  GLUCOSE 120* 115*  BUN 63* 25*  CREATININE 14.61* 8.16*  CALCIUM 9.1 8.6*   Liver Function Tests: Recent Labs  Lab 07/04/19 1238 07/06/19 1122  AST 53* 95*  ALT 18 35  ALKPHOS 83 69  BILITOT 0.8 0.7  PROT 7.1 6.1*  ALBUMIN 3.0* 2.3*   Recent Labs  Lab 07/04/19 1238  LIPASE 50   CBC: Recent Labs  Lab 07/04/19 1238 07/06/19 1122  WBC 17.7* 11.5*  NEUTROABS  --  9.3*  HGB 12.0 10.1*  HCT 38.4 31.5*  MCV 98.0 95.7  PLT 162 150   Blood Culture    Component Value Date/Time   SDES BLOOD RIGHT HAND 07/06/2019 1134   SPECREQUEST  07/06/2019 1134    BOTTLES DRAWN AEROBIC AND ANAEROBIC Blood Culture adequate volume   CULT  07/06/2019 1134    NO GROWTH 2 DAYS Performed at [facility], [place] [address], [place], [place] [address]    REPTSTATUS PENDING 07/06/2019 1134     CBG: Recent Labs  Lab 07/07/19 0709 07/07/19 1120 07/07/19 1619 07/07/19 2128 07/08/19 0635  GLUCAP 98 139* 221* 135* 82   Studies/Results: US RENAL  Result Date: 07/06/2019 CLINICAL DATA:  Right pyelonephritis. EXAM: RENAL / URINARY TRACT ULTRASOUND COMPLETE COMPARISON:  None. FINDINGS: Right Kidney: Renal measurements: 10.1 x 4.8 x 5.5 cm = volume: 139 mL. Diffusely increased parenchymal echogenicity. Several small benign-appearing renal cysts are noted, largest measuring approximately 2.6 cm. No soft mass or hydronephrosis visualized. Left Kidney: Renal measurements: 6.7 x 4.9 x 4.4 cm = volume: 76 mL. Diffusely increased parenchymal echogenicity. A small benign-appearing cyst is seen which measures 2.4 cm. No mass or hydronephrosis visualized. Bladder: Bladder is nearly completely empty and not well visualized. Other: None. IMPRESSION: Diffusely increased renal parenchymal echogenicity, consistent with medical renal disease. No evidence of hydronephrosis. Small bilateral renal cysts.  No masses identified. Electronically Signed   By: Marlaine Hind M.D.   On: 07/06/2019 16:52   DG CHEST PORT 1 VIEW  Result Date: 07/06/2019 CLINICAL DATA:  Fever. EXAM: PORTABLE CHEST 1 VIEW COMPARISON:  February 12, 2018 FINDINGS: The heart size is mildly enlarged. Aortic calcifications are noted. Both lungs are clear.  The visualized skeletal structures are unremarkable. There are bilateral old healed rib fractures. IMPRESSION: No active disease. Electronically Signed   By: Constance Holster M.D.   On: 07/06/2019 18:19    Medications: . meropenem (MERREM) IV 500 mg (07/07/19 2142)  . vancomycin 750 mg (07/05/19 1601)   . acidophilus  1 capsule Oral Daily  . amLODipine  10 mg Oral Daily  . aspirin EC  81 mg Oral Daily  . atorvastatin  40 mg Oral Daily  . Chlorhexidine Gluconate Cloth  6 each Topical Q0600  . Chlorhexidine Gluconate Cloth  6 each Topical Q0600  . clopidogrel  75 mg Oral Daily  .  feeding supplement (NEPRO CARB STEADY)  237 mL Oral BID BM  . feeding supplement (PRO-STAT SUGAR FREE 64)  30 mL Oral BID  . heparin  5,000 Units Subcutaneous Q8H  . insulin aspart  0-6 Units Subcutaneous TID WC  . insulin aspart protamine- aspart  20 Units Subcutaneous BID WC  . isosorbide mononitrate  30 mg Oral Daily  . metoprolol tartrate  25 mg Oral BID  . multivitamin  1 tablet Oral QHS  . mupirocin ointment  1 application Nasal BID  . nystatin  1 application Topical BID  . pantoprazole  40 mg Oral Daily  . predniSONE  5 mg Oral Q breakfast    Dialysis Orders: TTS -[facility] 3.25hrs, BFR400, [id number], EDW 69.5kg,2K/2.25Ca  Access:LU AVG Heparinnone Mircera44mcg q4wks - last4/27 Hectorol64mcg IV qHD   Assessment/Plan: 1. Fever/chills- CT showed likely cellulitis of R buttock and possible pyelonephritis vs infected cyst. Renal US 5/9--> no hydro, some benign-appearing cysts. BC NGTD.  On vanc/ meropenem (07/05/19-) 2. Abnormal ovarian lesion - incidental findings on CT. will need additional workup. Per primary 3. ESRD- On HD TTS. Last K 3.5. Orders written for HD today per regular schedule.  4. Hypertension/volume- BP well controlled. Does not appear volume overloaded.   5. Anemiaof CKD- last Hgb 10.1. ESA due today, will write orders to give with HD. 6. Secondary Hyperparathyroidism -Ca at goal. Will check phos. Continue VDRA and binders.  7. Nutrition- Renal diet w/fluid restrictions.  8. Lupus- on chronic prednisone 9. CAD s/p stenting- per primary 10. DMT2- on insulin, per primary  Jen Mow, PA-C [facility] Pager: [phone] 07/08/2019,11:15 AM  LOS: 3 days

## 2019-07-08 NOTE — Plan of Care (Signed)

## 2019-07-08 NOTE — Plan of Care (Signed)
  Problem: Education: Goal: Knowledge of General Education information will improve Description: Including pain rating scale, medication(s)/side effects and non-pharmacologic comfort measures Outcome: Progressing   Problem: Pain Managment: Goal: General experience of comfort will improve Outcome: Progressing   

## 2019-07-08 NOTE — Progress Notes (Signed)
HD orders modified to 07/09/2019 Dr Hollie Salk. Informed Primary RN , Arlana Lindau at [phone] of above

## 2019-07-08 NOTE — Consult Note (Signed)
Paula Bass [place] Jul 13, 1970  [id number].    Requesting MD: Dr. Hosie Poisson Chief Complaint/Reason for Consult: right buttock cellulitis  HPI:  This is a 49 yo black female with ESRD on HD, lupus on prednisone, HTN, DM, CAD, who began having right buttock pain last Wednesday.  She admits to fevers and worsening pain that brought her to the ED on Friday.  She was admitted and had an elevated WBC and some fevers.  She was started on abx therapy.  A CT scan did not show any evidence of abscess.  Her WBC and fevers have resolved but the patient continues to have pain.  We were asked to see for evaluation for further care.  ROS: ROS: Please see HPI, otherwise all other systems are negative except for cough, occasional chronic SOB, which is ok right now, chronic HD dependent.  Family History  Problem Relation Age of Onset  . Hypertension Mother   . Other Father        unkown health history  . Healthy Brother   . Healthy Brother   . Colon cancer Neg Hx   . Esophageal cancer Neg Hx   . Breast cancer Neg Hx     Past Medical History:  Diagnosis Date  . Acute diverticulitis 12/01/2017  . CAD (coronary artery disease) 2016  . CAD (coronary artery disease) 04/19/2018   Hx of PCI to [facility] in [place] in 2017 // [place] 03/2018: dLCx 12, OM1 40, OM2 50, pRCA stent ok, mid RCA 90, EF 55-65, PCI: DES to dLCx  . Diabetes mellitus without complication ([place])   . Dialysis patient ([place])   . Diverticulitis   . GI bleeding   . Hypertension   . LGI bleed 12/16/2017  . Lower GI bleed   . Lupus ([place])   . Renal disorder    dialysis  . UTI (urinary tract infection) 12/01/2017    Past Surgical History:  Procedure Laterality Date  . CARDIAC SURGERY     cardiac shunt  . CORONARY STENT INTERVENTION N/A 04/19/2018   Procedure: CORONARY STENT INTERVENTION;  Surgeon: [name], MD;  Location: [facility];  Service: Cardiovascular;  Laterality: N/A;  . Kidney graft Left   . LEFT HEART CATH  AND CORONARY ANGIOGRAPHY N/A 04/19/2018   Procedure: LEFT HEART CATH AND CORONARY ANGIOGRAPHY;  Surgeon: [name], MD;  Location: [facility];  Service: Cardiovascular;  Laterality: N/A;    Social History:  reports that she has never smoked. She has never used smokeless tobacco. She reports that she does not drink alcohol or use drugs.  Allergies:  Allergies  Allergen Reactions  . Penicillins Hives    DID THE REACTION INVOLVE: Swelling of the face/tongue/throat, SOB, or low BP? Yes Sudden or severe rash/hives, skin peeling, or the inside of the mouth or nose? Yes Did it require medical treatment? No When did it last happen?Within the past 10 years If all above answers are "NO", may proceed with cephalosporin use.     Medications Prior to Admission  Medication Sig Dispense Refill  . amLODipine (NORVASC) 10 MG tablet Take 10 mg by mouth daily.    Marland Kitchen ammonium lactate (AMLACTIN) 12 % lotion Apply 1 application topically as needed for dry skin. 400 g 0  . aspirin EC 81 MG tablet Take 1 tablet (81 mg total) by mouth daily.    Marland Kitchen atorvastatin (LIPITOR) 40 MG tablet Take 40 mg by mouth daily.     Marland Kitchen  AURYXIA 1 GM 210 MG(Fe) tablet Take 1-2 tablets by mouth See admin instructions. Take 2 tablets three times a day with meals and 1 tablet twice a day with a snack    . clopidogrel (PLAVIX) 75 MG tablet Take 1 tablet (75 mg total) by mouth daily.    . CYANOCOBALAMIN PO Take 2 tablets by mouth daily.    . insulin NPH-regular Human (70-30) 100 UNIT/ML injection Inject 30-40 Units into the skin 2 (two) times daily. Inject 40 units in the morning and 30 units at bedtime.    . isosorbide mononitrate (IMDUR) 30 MG 24 hr tablet Take 1 tablet (30 mg total) by mouth daily. 90 tablet 3  . Lactobacillus (ACIDOPHILUS) CAPS capsule Take 1 capsule by mouth daily.    Marland Kitchen lidocaine-prilocaine (EMLA) cream Apply 1 application topically Every Tuesday,Thursday,and Saturday with dialysis.     Marland Kitchen meclizine  (ANTIVERT) 12.5 MG tablet Take 12.5 mg by mouth 2 (two) times daily as needed for dizziness.    . metoprolol tartrate (LOPRESSOR) 25 MG tablet Take 1 tablet (25 mg total) by mouth 2 (two) times daily. 180 tablet 2  . multivitamin (RENA-VIT) TABS tablet Take 1 tablet by mouth daily.    . nitroGLYCERIN (NITROSTAT) 0.4 MG SL tablet Place 1 tablet (0.4 mg total) under the tongue every 5 (five) minutes as needed. 25 tablet 2  . NYSTATIN powder Apply 1 application topically 3 (three) times daily as needed for rash.    Marland Kitchen OVER THE COUNTER MEDICATION Take 3 tablets by mouth daily. Tumeric and Ginger gummies    . pantoprazole (PROTONIX) 40 MG tablet Take 1 tablet (40 mg total) by mouth daily. 30 tablet 2  . Polyethyl Glycol-Propyl Glycol (LUBRICANT EYE DROPS) 0.4-0.3 % SOLN Place 1 drop into both eyes 3 (three) times daily as needed (dry/irritated eyes.).    Marland Kitchen predniSONE (DELTASONE) 5 MG tablet Take 5-10 mg by mouth daily as needed for pain.       Physical Exam: Blood pressure 134/80, pulse 82, temperature 98.5 F (36.9 C), temperature source Oral, resp. rate 18, height 5\' 1"  (1.549 m), weight 69.2 kg, SpO2 100 %. General: pleasant, obese black female who is laying in bed in NAD Heart: regular, rate, and rhythm.  Normal s1,s2. No obvious murmurs, gallops, or rubs noted.  Palpable pedal pulses bilaterally Lungs: CTAB, no wheezes, rhonchi, or rales noted.  Respiratory effort nonlabored Abd: soft, NT, ND, +BS, no masses, hernias, or organomegaly Skin: warm and dry with no masses, lesions, or rashes.  She does have an area of induration and some fluctuance noted on the right gluteus.  There is some skin peeling c/w previous edema that is slightly improved.  There is a centralized area that when manipulated, a small amount of purulent drainage is noted.  This is pinpoint only.   Psych: A&Ox3 with an appropriate affect.   Results for orders placed or performed during the hospital encounter of 07/04/19 (from  the past 48 hour(s))  Glucose, capillary     Status: Abnormal   Collection Time: 07/06/19 12:51 PM  Result Value Ref Range   Glucose-Capillary 111 (H) 70 - 99 mg/dL    Comment: Glucose reference range applies only to samples taken after fasting for at least 8 hours.  Glucose, capillary     Status: Abnormal   Collection Time: 07/06/19  4:36 PM  Result Value Ref Range   Glucose-Capillary 138 (H) 70 - 99 mg/dL    Comment: Glucose reference range applies  only to samples taken after fasting for at least 8 hours.  Glucose, capillary     Status: Abnormal   Collection Time: 07/06/19  8:54 PM  Result Value Ref Range   Glucose-Capillary 184 (H) 70 - 99 mg/dL    Comment: Glucose reference range applies only to samples taken after fasting for at least 8 hours.  Glucose, capillary     Status: None   Collection Time: 07/07/19  7:09 AM  Result Value Ref Range   Glucose-Capillary 98 70 - 99 mg/dL    Comment: Glucose reference range applies only to samples taken after fasting for at least 8 hours.  Procalcitonin     Status: None   Collection Time: 07/07/19  7:28 AM  Result Value Ref Range   Procalcitonin 5.41 ng/mL    Comment:        Interpretation: PCT > 2 ng/mL: Systemic infection (sepsis) is likely, unless other causes are known. (NOTE)       Sepsis PCT Algorithm           Lower Respiratory Tract                                      Infection PCT Algorithm    ----------------------------     ----------------------------         PCT < 0.25 ng/mL                PCT < 0.10 ng/mL         Strongly encourage             Strongly discourage   discontinuation of antibiotics    initiation of antibiotics    ----------------------------     -----------------------------       PCT 0.25 - 0.50 ng/mL            PCT 0.10 - 0.25 ng/mL               OR       >80% decrease in PCT            Discourage initiation of                                            antibiotics      Encourage discontinuation            of antibiotics    ----------------------------     -----------------------------         PCT >= 0.50 ng/mL              PCT 0.26 - 0.50 ng/mL               AND       <80% decrease in PCT              Encourage initiation of                                             antibiotics       Encourage continuation           of antibiotics    ----------------------------     -----------------------------  PCT >= 0.50 ng/mL                  PCT > 0.50 ng/mL               AND         increase in PCT                  Strongly encourage                                      initiation of antibiotics    Strongly encourage escalation           of antibiotics                                     -----------------------------                                           PCT <= 0.25 ng/mL                                                 OR                                        > 80% decrease in PCT                                     Discontinue / Do not initiate                                             antibiotics Performed at [facility], [address] [place], [place] [address]   Glucose, capillary     Status: Abnormal   Collection Time: 07/07/19 11:20 AM  Result Value Ref Range   Glucose-Capillary 139 (H) 70 - 99 mg/dL    Comment: Glucose reference range applies only to samples taken after fasting for at least 8 hours.  Glucose, capillary     Status: Abnormal   Collection Time: 07/07/19  4:19 PM  Result Value Ref Range   Glucose-Capillary 221 (H) 70 - 99 mg/dL    Comment: Glucose reference range applies only to samples taken after fasting for at least 8 hours.  Glucose, capillary     Status: Abnormal   Collection Time: 07/07/19  9:28 PM  Result Value Ref Range   Glucose-Capillary 135 (H) 70 - 99 mg/dL    Comment: Glucose reference range applies only to samples taken after fasting for at least 8 hours.  Procalcitonin     Status: None   Collection Time: 07/08/19  3:05 AM    Result Value Ref Range   Procalcitonin 4.44 ng/mL    Comment:        Interpretation: PCT > 2 ng/mL: Systemic infection (sepsis)  is likely, unless other causes are known. (NOTE)       Sepsis PCT Algorithm           Lower Respiratory Tract                                      Infection PCT Algorithm    ----------------------------     ----------------------------         PCT < 0.25 ng/mL                PCT < 0.10 ng/mL         Strongly encourage             Strongly discourage   discontinuation of antibiotics    initiation of antibiotics    ----------------------------     -----------------------------       PCT 0.25 - 0.50 ng/mL            PCT 0.10 - 0.25 ng/mL               OR       >80% decrease in PCT            Discourage initiation of                                            antibiotics      Encourage discontinuation           of antibiotics    ----------------------------     -----------------------------         PCT >= 0.50 ng/mL              PCT 0.26 - 0.50 ng/mL               AND       <80% decrease in PCT              Encourage initiation of                                             antibiotics       Encourage continuation           of antibiotics    ----------------------------     -----------------------------        PCT >= 0.50 ng/mL                  PCT > 0.50 ng/mL               AND         increase in PCT                  Strongly encourage                                      initiation of antibiotics    Strongly encourage escalation           of antibiotics                                     -----------------------------  PCT <= 0.25 ng/mL                                                 OR                                        > 80% decrease in PCT                                     Discontinue / Do not initiate                                             antibiotics Performed at [facility], [place] [address] [place], [place] [address]   Glucose, capillary     Status: None   Collection Time: 07/08/19  6:35 AM  Result Value Ref Range   Glucose-Capillary 82 70 - 99 mg/dL    Comment: Glucose reference range applies only to samples taken after fasting for at least 8 hours.  Phosphorus     Status: Abnormal   Collection Time: 07/08/19 11:20 AM  Result Value Ref Range   Phosphorus 4.9 (H) 2.5 - 4.6 mg/dL    Comment: Performed at [facility] [address] [place], [place] [address]  Glucose, capillary     Status: Abnormal   Collection Time: 07/08/19 11:35 AM  Result Value Ref Range   Glucose-Capillary 65 (L) 70 - 99 mg/dL    Comment: Glucose reference range applies only to samples taken after fasting for at least 8 hours.   US RENAL  Result Date: 07/06/2019 CLINICAL DATA:  Right pyelonephritis. EXAM: RENAL / URINARY TRACT ULTRASOUND COMPLETE COMPARISON:  None. FINDINGS: Right Kidney: Renal measurements: 10.1 x 4.8 x 5.5 cm = volume: 139 mL. Diffusely increased parenchymal echogenicity. Several small benign-appearing renal cysts are noted, largest measuring approximately 2.6 cm. No soft mass or hydronephrosis visualized. Left Kidney: Renal measurements: 6.7 x 4.9 x 4.4 cm = volume: 76 mL. Diffusely increased parenchymal echogenicity. A small benign-appearing cyst is seen which measures 2.4 cm. No mass or hydronephrosis visualized. Bladder: Bladder is nearly completely empty and not well visualized. Other: None. IMPRESSION: Diffusely increased renal parenchymal echogenicity, consistent with medical renal disease. No evidence of hydronephrosis. Small bilateral renal cysts.  No masses identified. Electronically Signed   By: Marlaine Hind M.D.   On: 07/06/2019 16:52   DG CHEST PORT 1 VIEW  Result Date: 07/06/2019 CLINICAL DATA:  Fever. EXAM: PORTABLE CHEST 1 VIEW COMPARISON:  February 12, 2018 FINDINGS: The heart size is mildly enlarged. Aortic calcifications are noted. Both lungs are clear. The  visualized skeletal structures are unremarkable. There are bilateral old healed rib fractures. IMPRESSION: No active disease. Electronically Signed   By: Constance Holster M.D.   On: 07/06/2019 18:19      Assessment/Plan Lupus on prednisone CAD, On plavix HTN DM ESRD, HD  Right buttock abscess The patient appears to have improving cellulitis but does have a more focal abscess noted on the right gluteus.  We will plan to  proceed with surgical I&D in the OR tomorrow as she has eaten today.  Increase risk of bleeding given her plavix use.  Discussed wound healing in the setting of DM, etc.  Discussed expected outcome as well.  She is agreeable to go to the OR to get this drained.  NPO p MN.  FEN - carb mod, NPO p MN VTE - plavix, heparin, will hold heparin in am ID - Merrem/Vanc   Henreitta Cea, [facility] Surgery 07/08/2019, 11:58 AM Please see Amion for pager number during day hours 7:00am-4:30pm or 7:00am -11:30am on weekends

## 2019-07-08 NOTE — Progress Notes (Signed)
PROGRESS NOTE    Paula Bass  SWN:[id number] DOB: [birth year] DOA: 07/04/2019 PCP: Benito Mccreedy, MD    Chief Complaint  Patient presents with  . Fever  . Diarrhea  . Weakness    Brief Narrative:   49 year old lady with prior history of end-stage renal disease on dialysis TTS, hypertension, diabetes, lupus unstable angina s/p PCI, obesity presents to ED for fever and chills for 3 days was found to have cellulitis of the skin and soft tissue of the right buttock and pyelonephritis. she was started on broad spectrum IV antibiotics and her cellulitis appear to be improving. Her fever has resolved, wbc count is improving.  On exam today patient has fluctuating area with some induration on the right buttock, though the erythema has improved we will request general surgery to evaluate for abscess and I&D if needed.  Patient seen and examined at bedside she reports she had chills overnight but no fever .  Pain is not well controlled on the right buttock  Assessment & Plan:   Principal Problem:   Cellulitis of buttock Active Problems:   ESRD (end stage renal disease) (HCC)   DM2 (diabetes mellitus, type 2) (HCC)   Lupus (HCC)   CAD (coronary artery disease)   Essential hypertension   Cellulitis/abscess of the Right Buttock: Erythema has improved but the swelling is persistent and area of fluctuant with induration present on the right buttock with persistent tenderness despite 4 days of IV antibiotics.  Patient is afebrile and leukocytosis improving, pro calcitonin is also improving General surgery consulted for further evaluation. Recommend getting out of bed and ambulating as tolerated. - pain control with tramadol and IV morphine.     ESRD on HD Nephrology consulted and recommendations given HD on TTS.    Type 2 DM: Uncontrolled with hypoglycemia CBG (last 3)  Recent Labs    07/08/19 0635 07/08/19 1135 07/08/19 1232  GLUCAP 82 65* 110*  Decrease the LOC  7030 to 10 units twice daily because of her CBG of 65 and around 1130 today.  Continue with sliding scale insulin. A1c is pending.   Essential Hypertension: Well controlled.  Resume Imdur and Norvasc.    Hyperlipidemia:  Resume Lipitor.    Intertrigo under the breasts; Recommend nystatin powder and zinc oxide    Right Pyelonephritis:  CT abd shows Stranding of the right inferior perinephric fat concerning for edema possibly related to cyst infection or pyelonephritis. US renal shows Diffusely increased renal parenchymal echogenicity, consistent with medical renal disease. She denies any flank pain at this time.   2 cm higher attenuating lesion in the right ovary, incidental finding on CT Recommend outpatient follow up with pelvic ultrasound and outpatient follow up .     Anemia of chronic disease probably secondary to ESRD Baseline hemoglobin between 9-10.  Currently hemoglobin is around 10.  Check iron levels.    DVT prophylaxis: Heparin Code Status: full code.  Family Communication: Family at bedside Disposition:   Status is: Inpatient  Remains inpatient appropriate because:IV treatments appropriate due to intensity of illness or inability to take PO, general surgery consult with abscess drainage scheduled later today   Dispo: The patient is from: Home              Anticipated d/c is to: pending              Anticipated d/c date is: 2 days              Patient  currently is not medically stable to d/c.        Consultants:   Nephrology.  Renal surgery   Procedures: HD session  Antimicrobials: IV vancomycin and IV meropenam since admission.   Subjective: Pain is not well controlled , had chills overnight.  No nausea vomiting or abdominal pain  Objective: Vitals:   07/07/19 1620 07/07/19 2126 07/08/19 0500 07/08/19 0909  BP: 113/60 130/62 128/70 134/80  Pulse: 60 86 81 82  Resp: 18 18 16 18   Temp:  98.4 F (36.9 C) 98.5 F (36.9 C)   TempSrc:  Oral  Oral   SpO2: 100% 97% 95% 100%  Weight:  69.2 kg    Height:        Intake/Output Summary (Last 24 hours) at 07/08/2019 1439 Last data filed at 07/08/2019 0522 Gross per 24 hour  Intake 340 ml  Output 0 ml  Net 340 ml   Filed Weights   07/05/19 2114 07/06/19 2052 07/07/19 2126  Weight: 69.9 kg 69.5 kg 69.2 kg    Examination:  General exam: Alert and in mild discomfort from right buttock pain Respiratory system: Clear to auscultation bilaterally, no wheezing or rhonchi Cardiovascular system: S1-S2 heard, regular rate rhythm, no JVD Gastrointestinal system: Abdomen is soft, nontender, nondistended, bowel sounds within normal limits Central nervous system: Alert and oriented, grossly nonfocal Extremities: No cyanosis or clubbing Skin: intertrigo under the breasts, and improving cellulitis of the buttocks.  Psychiatry: Mood is appropriate    Data Reviewed: I have personally reviewed following labs and imaging studies  CBC: Recent Labs  Lab 07/04/19 1238 07/06/19 1122  WBC 17.7* 11.5*  NEUTROABS  --  9.3*  HGB 12.0 10.1*  HCT 38.4 31.5*  MCV 98.0 95.7  PLT 162 703    Basic Metabolic Panel: Recent Labs  Lab 07/04/19 1238 07/06/19 1122 07/08/19 1120  NA 134* 134*  --   K 4.4 3.5  --   CL 94* 95*  --   CO2 22 24  --   GLUCOSE 120* 115*  --   BUN 63* 25*  --   CREATININE 14.61* 8.16*  --   CALCIUM 9.1 8.6*  --   PHOS  --   --  4.9*    GFR: Estimated Creatinine Clearance: 7.6 mL/min (A) (by C-G formula based on SCr of 8.16 mg/dL (H)).  Liver Function Tests: Recent Labs  Lab 07/04/19 1238 07/06/19 1122  AST 53* 95*  ALT 18 35  ALKPHOS 83 69  BILITOT 0.8 0.7  PROT 7.1 6.1*  ALBUMIN 3.0* 2.3*    CBG: Recent Labs  Lab 07/07/19 1619 07/07/19 2128 07/08/19 0635 07/08/19 1135 07/08/19 1232  GLUCAP 221* 135* 82 65* 110*     Recent Results (from the past 240 hour(s))  Respiratory Panel by RT PCR (Flu A&B, Covid) - Nasopharyngeal Swab     Status:  None   Collection Time: 07/04/19 11:43 PM   Specimen: Nasopharyngeal Swab  Result Value Ref Range Status   SARS Coronavirus 2 by RT PCR NEGATIVE NEGATIVE Final    Comment: (NOTE) SARS-CoV-2 target nucleic acids are NOT DETECTED. The SARS-CoV-2 RNA is generally detectable in upper respiratoy specimens during the acute phase of infection. The lowest concentration of SARS-CoV-2 viral copies this assay can detect is 131 copies/mL. A negative result does not preclude SARS-Cov-2 infection and should not be used as the sole basis for treatment or other patient management decisions. A negative result may occur with  improper specimen collection/handling, submission  of specimen other than nasopharyngeal swab, presence of viral mutation(s) within the areas targeted by this assay, and inadequate number of viral copies (<131 copies/mL). A negative result must be combined with clinical observations, patient history, and epidemiological information. The expected result is Negative. Fact Sheet for Patients:  PinkCheek.be Fact Sheet for Healthcare Providers:  GravelBags.it This test is not yet ap proved or cleared by the [place] FDA and  has been authorized for detection and/or diagnosis of SARS-CoV-2 by FDA under an Emergency Use Authorization (EUA). This EUA will remain  in effect (meaning this test can be used) for the duration of the COVID-19 declaration under Section 564(b)(1) of the Act, 21 U.S.C. section 360bbb-3(b)(1), unless the authorization is terminated or revoked sooner.    Influenza A by PCR NEGATIVE NEGATIVE Final   Influenza B by PCR NEGATIVE NEGATIVE Final    Comment: (NOTE) The Xpert Xpress SARS-CoV-2/FLU/RSV assay is intended as an aid in  the diagnosis of influenza from Nasopharyngeal swab specimens and  should not be used as a sole basis for treatment. Nasal washings and  aspirates are unacceptable for Xpert  Xpress SARS-CoV-2/FLU/RSV  testing. Fact Sheet for Patients: PinkCheek.be Fact Sheet for Healthcare Providers: GravelBags.it This test is not yet approved or cleared by the [place] FDA and  has been authorized for detection and/or diagnosis of SARS-CoV-2 by  FDA under an Emergency Use Authorization (EUA). This EUA will remain  in effect (meaning this test can be used) for the duration of the  Covid-19 declaration under Section 564(b)(1) of the Act, 21  U.S.C. section 360bbb-3(b)(1), unless the authorization is  terminated or revoked. Performed at [facility], [place] [address] [place], [place] [address]   MRSA PCR Screening     Status: Abnormal   Collection Time: 07/05/19 12:45 AM   Specimen: Nasal Mucosa; Nasopharyngeal  Result Value Ref Range Status   MRSA by PCR POSITIVE (A) NEGATIVE Final    Comment:        The GeneXpert MRSA Assay (FDA approved for NASAL specimens only), is one component of a comprehensive MRSA colonization surveillance program. It is not intended to diagnose MRSA infection nor to guide or monitor treatment for MRSA infections. RESULT CALLED TO, READ BACK BY AND VERIFIED WITHLendell Caprice RN 07/05/19 0347 JDW Performed at [place] [address] [place], [place] [address]   Culture, blood (single) w Reflex to ID Panel     Status: None (Preliminary result)   Collection Time: 07/05/19 12:59 AM   Specimen: BLOOD  Result Value Ref Range Status   Specimen Description BLOOD SITE NOT SPECIFIED  Final   Special Requests   Final    BOTTLES DRAWN AEROBIC ONLY Blood Culture adequate volume   Culture   Final    NO GROWTH 3 DAYS Performed at [facility], [address] [place], [place] [address]    Report Status PENDING  Incomplete  Culture, blood (Routine X 2) w Reflex to ID Panel     Status: None (Preliminary result)   Collection Time: 07/06/19 11:29 AM   Specimen: BLOOD    Result Value Ref Range Status   Specimen Description BLOOD RIGHT ANTECUBITAL  Final   Special Requests   Final    BOTTLES DRAWN AEROBIC AND ANAEROBIC Blood Culture adequate volume   Culture   Final    NO GROWTH 2 DAYS Performed at [facility], [place] [address], [place],  [address]  Report Status PENDING  Incomplete  Culture, blood (Routine X 2) w Reflex to ID Panel     Status: None (Preliminary result)   Collection Time: 07/06/19 11:34 AM   Specimen: BLOOD RIGHT HAND  Result Value Ref Range Status   Specimen Description BLOOD RIGHT HAND  Final   Special Requests   Final    BOTTLES DRAWN AEROBIC AND ANAEROBIC Blood Culture adequate volume   Culture   Final    NO GROWTH 2 DAYS Performed at [facility], [address] [place], [place] [address]    Report Status PENDING  Incomplete         Radiology Studies: US RENAL  Result Date: 07/06/2019 CLINICAL DATA:  Right pyelonephritis. EXAM: RENAL / URINARY TRACT ULTRASOUND COMPLETE COMPARISON:  None. FINDINGS: Right Kidney: Renal measurements: 10.1 x 4.8 x 5.5 cm = volume: 139 mL. Diffusely increased parenchymal echogenicity. Several small benign-appearing renal cysts are noted, largest measuring approximately 2.6 cm. No soft mass or hydronephrosis visualized. Left Kidney: Renal measurements: 6.7 x 4.9 x 4.4 cm = volume: 76 mL. Diffusely increased parenchymal echogenicity. A small benign-appearing cyst is seen which measures 2.4 cm. No mass or hydronephrosis visualized. Bladder: Bladder is nearly completely empty and not well visualized. Other: None. IMPRESSION: Diffusely increased renal parenchymal echogenicity, consistent with medical renal disease. No evidence of hydronephrosis. Small bilateral renal cysts.  No masses identified. Electronically Signed   By: Marlaine Hind M.D.   On: 07/06/2019 16:52   DG CHEST PORT 1 VIEW  Result Date: 07/06/2019 CLINICAL DATA:  Fever. EXAM: PORTABLE CHEST 1 VIEW COMPARISON:  February 12, 2018 FINDINGS: The heart size is mildly enlarged. Aortic calcifications are noted. Both lungs are clear. The visualized skeletal structures are unremarkable. There are bilateral old healed rib fractures. IMPRESSION: No active disease. Electronically Signed   By: Constance Holster M.D.   On: 07/06/2019 18:19        Scheduled Meds: . acidophilus  1 capsule Oral Daily  . amLODipine  10 mg Oral Daily  . aspirin EC  81 mg Oral Daily  . atorvastatin  40 mg Oral Daily  . Chlorhexidine Gluconate Cloth  6 each Topical Q0600  . Chlorhexidine Gluconate Cloth  6 each Topical Q0600  . clopidogrel  75 mg Oral Daily  . darbepoetin (ARANESP) injection - DIALYSIS  60 mcg Intravenous Q Tue-HD  . feeding supplement (NEPRO CARB STEADY)  237 mL Oral BID BM  . feeding supplement (PRO-STAT SUGAR FREE 64)  30 mL Oral BID  . [START ON 07/10/2019] heparin  5,000 Units Subcutaneous Q8H  . insulin aspart  0-6 Units Subcutaneous TID WC  . insulin aspart protamine- aspart  20 Units Subcutaneous BID WC  . isosorbide mononitrate  30 mg Oral Daily  . metoprolol tartrate  25 mg Oral BID  . multivitamin  1 tablet Oral QHS  . mupirocin ointment  1 application Nasal BID  . nystatin  1 application Topical BID  . pantoprazole  40 mg Oral Daily  . predniSONE  5 mg Oral Q breakfast   Continuous Infusions: . meropenem (MERREM) IV 500 mg (07/07/19 2142)  . vancomycin 750 mg (07/05/19 1601)     LOS: 3 days        Hosie Poisson, MD [facility]   To contact the attending provider between 7A-7P or the covering provider during after hours 7P-7A, please log into the web site [email or web address] and access using universal [place] password for that web  site. If you do not have the password, please call the hospital operator.  07/08/2019, 2:39 PM

## 2019-07-09 ENCOUNTER — Inpatient Hospital Stay (HOSPITAL_COMMUNITY): Payer: Medicare HMO | Admitting: Anesthesiology

## 2019-07-09 ENCOUNTER — Encounter (HOSPITAL_COMMUNITY)
Admission: EM | Disposition: A | Discharge status: Home Health | Payer: Self-pay | Source: Home / Self Care | Attending: Internal Medicine

## 2019-07-09 ENCOUNTER — Encounter (HOSPITAL_COMMUNITY): Payer: Self-pay | Admitting: Internal Medicine

## 2019-07-09 DIAGNOSIS — L03317 Cellulitis of buttock: Secondary | ICD-10-CM

## 2019-07-09 HISTORY — PX: INCISION AND DRAINAGE PERIRECTAL ABSCESS: SHX1804

## 2019-07-09 LAB — BASIC METABOLIC PANEL
Anion gap: 13 (ref 5–15)
BUN: 12 mg/dL (ref 6–20)
CO2: 27 mmol/L (ref 22–32)
Calcium: 8.1 mg/dL — ABNORMAL LOW (ref 8.9–10.3)
Chloride: 96 mmol/L — ABNORMAL LOW (ref 98–111)
Creatinine, Ser: 4.95 mg/dL — ABNORMAL HIGH (ref 0.44–1.00)
GFR calc Af Amer: 11 mL/min — ABNORMAL LOW (ref >60)
GFR calc non Af Amer: 10 mL/min — ABNORMAL LOW (ref >60)
Glucose, Bld: 93 mg/dL (ref 70–99)
Potassium: 3 mmol/L — ABNORMAL LOW (ref 3.5–5.1)
Sodium: 136 mmol/L (ref 135–145)

## 2019-07-09 LAB — CBC
HCT: 30.3 % — ABNORMAL LOW (ref 36.0–46.0)
Hemoglobin: 9.9 g/dL — ABNORMAL LOW (ref 12.0–15.0)
MCH: 30.7 pg (ref 26.0–34.0)
MCHC: 32.7 g/dL (ref 30.0–36.0)
MCV: 94.1 fL (ref 80.0–100.0)
Platelets: 230 10*3/uL (ref 150–400)
RBC: 3.22 MIL/uL — ABNORMAL LOW (ref 3.87–5.11)
RDW: 17.6 % — ABNORMAL HIGH (ref 11.5–15.5)
WBC: 11.8 10*3/uL — ABNORMAL HIGH (ref 4.0–10.5)
nRBC: 0 % (ref 0.0–0.2)

## 2019-07-09 LAB — IRON AND TIBC
Iron: 61 ug/dL (ref 28–170)
Saturation Ratios: 39 % — ABNORMAL HIGH (ref 10.4–31.8)
TIBC: 158 ug/dL — ABNORMAL LOW (ref 250–450)
UIBC: 97 ug/dL

## 2019-07-09 LAB — GLUCOSE, CAPILLARY
Glucose-Capillary: 75 mg/dL (ref 70–99)
Glucose-Capillary: 105 mg/dL — ABNORMAL HIGH (ref 70–99)
Glucose-Capillary: 123 mg/dL — ABNORMAL HIGH (ref 70–99)
Glucose-Capillary: 270 mg/dL — ABNORMAL HIGH (ref 70–99)

## 2019-07-09 SURGERY — INCISION AND DRAINAGE, ABSCESS, PERIRECTAL
Anesthesia: General | Site: Buttocks

## 2019-07-09 MED ORDER — FERRIC CITRATE 1 GM 210 MG(FE) PO TABS
420.0000 mg | ORAL_TABLET | Freq: Three times a day (TID) | ORAL | Status: DC
  Administered 2019-07-09 – 2019-07-11 (×5): 420 mg via ORAL
  Filled 2019-07-09 (×5): qty 2

## 2019-07-09 MED ORDER — PHENYLEPHRINE 40 MCG/ML (10ML) SYRINGE FOR IV PUSH (FOR BLOOD PRESSURE SUPPORT)
PREFILLED_SYRINGE | INTRAVENOUS | Status: DC | PRN
  Administered 2019-07-09: 80 ug via INTRAVENOUS

## 2019-07-09 MED ORDER — OXYCODONE HCL 5 MG PO TABS: 5.0000 mg | ORAL_TABLET | Freq: Once | ORAL | Status: DC | PRN

## 2019-07-09 MED ORDER — FENTANYL CITRATE (PF) 100 MCG/2ML IJ SOLN
INTRAMUSCULAR | Status: AC
  Filled 2019-07-09: qty 2

## 2019-07-09 MED ORDER — PHENYLEPHRINE 40 MCG/ML (10ML) SYRINGE FOR IV PUSH (FOR BLOOD PRESSURE SUPPORT)
PREFILLED_SYRINGE | INTRAVENOUS | Status: AC
  Filled 2019-07-09: qty 10

## 2019-07-09 MED ORDER — PROPOFOL 10 MG/ML IV BOLUS
INTRAVENOUS | Status: DC | PRN
  Administered 2019-07-09: 50 mg via INTRAVENOUS
  Administered 2019-07-09: 120 mg via INTRAVENOUS

## 2019-07-09 MED ORDER — 0.9 % SODIUM CHLORIDE (POUR BTL) OPTIME
TOPICAL | Status: DC | PRN
  Administered 2019-07-09: 1000 mL

## 2019-07-09 MED ORDER — LIDOCAINE 2% (20 MG/ML) 5 ML SYRINGE
INTRAMUSCULAR | Status: AC
  Filled 2019-07-09: qty 5

## 2019-07-09 MED ORDER — PROPOFOL 10 MG/ML IV BOLUS
INTRAVENOUS | Status: AC
  Filled 2019-07-09: qty 20

## 2019-07-09 MED ORDER — OXYCODONE HCL 5 MG PO TABS
5.0000 mg | ORAL_TABLET | ORAL | Status: DC | PRN
  Administered 2019-07-11: 5 mg via ORAL
  Filled 2019-07-09: qty 1

## 2019-07-09 MED ORDER — ONDANSETRON HCL 4 MG/2ML IJ SOLN
INTRAMUSCULAR | Status: DC | PRN
  Administered 2019-07-09: 4 mg via INTRAVENOUS

## 2019-07-09 MED ORDER — SUCCINYLCHOLINE CHLORIDE 200 MG/10ML IV SOSY
PREFILLED_SYRINGE | INTRAVENOUS | Status: AC
  Filled 2019-07-09: qty 10

## 2019-07-09 MED ORDER — SODIUM CHLORIDE 0.9 % IV SOLN: INTRAVENOUS | Status: DC

## 2019-07-09 MED ORDER — FENTANYL CITRATE (PF) 100 MCG/2ML IJ SOLN
25.0000 ug | INTRAMUSCULAR | Status: DC | PRN
  Administered 2019-07-09: 50 ug via INTRAVENOUS

## 2019-07-09 MED ORDER — SUCCINYLCHOLINE CHLORIDE 200 MG/10ML IV SOSY
PREFILLED_SYRINGE | INTRAVENOUS | Status: DC | PRN
Start: 2019-07-09 — End: 2019-07-09
  Administered 2019-07-09: 120 mg via INTRAVENOUS

## 2019-07-09 MED ORDER — FENTANYL CITRATE (PF) 250 MCG/5ML IJ SOLN
INTRAMUSCULAR | Status: DC | PRN
  Administered 2019-07-09: 50 ug via INTRAVENOUS
  Administered 2019-07-09: 100 ug via INTRAVENOUS

## 2019-07-09 MED ORDER — DARBEPOETIN ALFA 60 MCG/0.3ML IJ SOSY
60.0000 ug | PREFILLED_SYRINGE | INTRAMUSCULAR | Status: DC
  Filled 2019-07-09: qty 0.3

## 2019-07-09 MED ORDER — DEXAMETHASONE SODIUM PHOSPHATE 10 MG/ML IJ SOLN
INTRAMUSCULAR | Status: DC | PRN
Start: 2019-07-09 — End: 2019-07-09
  Administered 2019-07-09: 4 mg via INTRAVENOUS

## 2019-07-09 MED ORDER — ONDANSETRON HCL 4 MG/2ML IJ SOLN: 4.0000 mg | Freq: Once | INTRAMUSCULAR | Status: DC | PRN

## 2019-07-09 MED ORDER — BUPIVACAINE HCL (PF) 0.25 % IJ SOLN
INTRAMUSCULAR | Status: AC
  Filled 2019-07-09: qty 30

## 2019-07-09 MED ORDER — LIDOCAINE 2% (20 MG/ML) 5 ML SYRINGE
INTRAMUSCULAR | Status: DC | PRN
  Administered 2019-07-09: 40 mg via INTRAVENOUS

## 2019-07-09 MED ORDER — DEXAMETHASONE SODIUM PHOSPHATE 10 MG/ML IJ SOLN
INTRAMUSCULAR | Status: AC
  Filled 2019-07-09: qty 2

## 2019-07-09 MED ORDER — VANCOMYCIN HCL IN DEXTROSE 750-5 MG/150ML-% IV SOLN
INTRAVENOUS | Status: AC
  Administered 2019-07-09: 750 mg
  Filled 2019-07-09: qty 150

## 2019-07-09 MED ORDER — OXYCODONE HCL 5 MG/5ML PO SOLN: 5.0000 mg | Freq: Once | ORAL | Status: DC | PRN

## 2019-07-09 MED ORDER — FENTANYL CITRATE (PF) 250 MCG/5ML IJ SOLN
INTRAMUSCULAR | Status: AC
  Filled 2019-07-09: qty 5

## 2019-07-09 MED ORDER — ONDANSETRON HCL 4 MG/2ML IJ SOLN
INTRAMUSCULAR | Status: AC
  Filled 2019-07-09: qty 4

## 2019-07-09 MED ORDER — ROCURONIUM BROMIDE 10 MG/ML (PF) SYRINGE
PREFILLED_SYRINGE | INTRAVENOUS | Status: AC
  Filled 2019-07-09: qty 10

## 2019-07-09 SURGICAL SUPPLY — 31 items
BNDG GAUZE ELAST 4 BULKY (GAUZE/BANDAGES/DRESSINGS) IMPLANT
COVER MAYO STAND STRL (DRAPES) ×2 IMPLANT
COVER SURGICAL LIGHT HANDLE (MISCELLANEOUS) ×2 IMPLANT
COVER WAND RF STERILE (DRAPES) ×2 IMPLANT
DRAIN PENROSE 1/4X12 LTX STRL (WOUND CARE) ×2 IMPLANT
ELECT REM PT RETURN 9FT ADLT (ELECTROSURGICAL) ×2
ELECTRODE REM PT RTRN 9FT ADLT (ELECTROSURGICAL) ×1 IMPLANT
GAUZE SPONGE 4X4 12PLY STRL (GAUZE/BANDAGES/DRESSINGS) ×2 IMPLANT
GLOVE BIOGEL PI IND STRL 7.0 (GLOVE) ×1 IMPLANT
GLOVE BIOGEL PI INDICATOR 7.0 (GLOVE) ×1
GLOVE SURG SS PI 7.0 STRL IVOR (GLOVE) ×2 IMPLANT
GOWN STRL REUS W/ TWL LRG LVL3 (GOWN DISPOSABLE) ×2 IMPLANT
GOWN STRL REUS W/TWL LRG LVL3 (GOWN DISPOSABLE) ×2
KIT BASIN OR (CUSTOM PROCEDURE TRAY) ×2 IMPLANT
KIT TURNOVER KIT B (KITS) ×2 IMPLANT
NEEDLE 22X1 1/2 (OR ONLY) (NEEDLE) ×2 IMPLANT
NS IRRIG 1000ML POUR BTL (IV SOLUTION) ×2 IMPLANT
PACK LITHOTOMY IV (CUSTOM PROCEDURE TRAY) ×2 IMPLANT
PAD ABD 8X10 STRL (GAUZE/BANDAGES/DRESSINGS) ×2 IMPLANT
PAD ARMBOARD 7.5X6 YLW CONV (MISCELLANEOUS) ×2 IMPLANT
PENCIL SMOKE EVACUATOR (MISCELLANEOUS) ×2 IMPLANT
SPONGE LAP 18X18 RF (DISPOSABLE) ×2 IMPLANT
SURGILUBE 2OZ TUBE FLIPTOP (MISCELLANEOUS) ×2 IMPLANT
SUT ETHILON 3 0 FSL (SUTURE) ×2 IMPLANT
SWAB CULTURE ESWAB REG 1ML (MISCELLANEOUS) ×2 IMPLANT
SWAB CULTURE LIQ STUART DBL (MISCELLANEOUS) ×2 IMPLANT
SYR BULB EAR ULCER 3OZ GRN STR (SYRINGE) ×2 IMPLANT
TOWEL GREEN STERILE (TOWEL DISPOSABLE) ×2 IMPLANT
TOWEL GREEN STERILE FF (TOWEL DISPOSABLE) ×2 IMPLANT
TUBE CONNECTING 12X1/4 (SUCTIONS) ×2 IMPLANT
YANKAUER SUCT BULB TIP NO VENT (SUCTIONS) ×2 IMPLANT

## 2019-07-09 NOTE — Plan of Care (Signed)
  Problem: Pain Managment: Goal: General experience of comfort will improve Outcome: Progressing   Problem: Skin Integrity: Goal: Risk for impaired skin integrity will decrease Outcome: Progressing   Problem: Fluid Volume: Goal: Compliance with measures to maintain balanced fluid volume will improve Outcome: Progressing

## 2019-07-09 NOTE — Progress Notes (Signed)
[facility] Progress Note   Subjective:   Patient seen and examined at bedside.  Tearful on exam today.  Frustrated with ongoing pain and infection.  Provided support. Itching yesterday, not getting binders - now ordered.  HD overnight, tolerated well.  Denies SOB, CP, n/v/d, abdominal pain, weakness, dizziness and fatigue.   Objective Vitals:   07/09/19 0445 07/09/19 0500 07/09/19 1029 07/09/19 1141  BP: (!) 150/87 (!) 153/73 133/74   Pulse: 80 86 81   Resp: 18 18 18    Temp: 98 F (36.7 C) 98.2 F (36.8 C) 98.3 F (36.8 C)   TempSrc: Oral Oral Oral   SpO2: 99% 100% 94%   Weight: 69.4 kg   69.4 kg  Height:    5' 0.98" (1.549 m)   Physical Exam General:WD, obese female in NAD Heart:RRR Lungs:CTAB Abdomen:soft, NTND, +BS Extremities:no LE edema Dialysis Access: LU AVG +b   Filed Weights   07/09/19 0108 07/09/19 0445 07/09/19 1141  Weight: 70.4 kg 69.4 kg 69.4 kg    Intake/Output Summary (Last 24 hours) at 07/09/2019 1231 Last data filed at 07/09/2019 1100 Gross per 24 hour  Intake 782 ml  Output 1000 ml  Net -218 ml    Additional Objective Labs: Basic Metabolic Panel: Recent Labs  Lab 07/04/19 1238 07/06/19 1122 07/08/19 1120 07/09/19 0559  NA 134* 134*  --  136  K 4.4 3.5  --  3.0*  CL 94* 95*  --  96*  CO2 22 24  --  27  GLUCOSE 120* 115*  --  93  BUN 63* 25*  --  12  CREATININE 14.61* 8.16*  --  4.95*  CALCIUM 9.1 8.6*  --  8.1*  PHOS  --   --  4.9*  --    Liver Function Tests: Recent Labs  Lab 07/04/19 1238 07/06/19 1122  AST 53* 95*  ALT 18 35  ALKPHOS 83 69  BILITOT 0.8 0.7  PROT 7.1 6.1*  ALBUMIN 3.0* 2.3*   Recent Labs  Lab 07/04/19 1238  LIPASE 50   CBC: Recent Labs  Lab 07/04/19 1238 07/06/19 1122 07/09/19 0559  WBC 17.7* 11.5* 11.8*  NEUTROABS  --  9.3*  --   HGB 12.0 10.1* 9.9*  HCT 38.4 31.5* 30.3*  MCV 98.0 95.7 94.1  PLT 162 150 230   CBG: Recent Labs  Lab 07/08/19 1232 07/08/19 1631  07/08/19 2116 07/09/19 0657 07/09/19 1123  GLUCAP 110* 109* 210* 75 105*   Iron Studies:  Recent Labs    07/09/19 0559  IRON 61  TIBC 158*   Lab Results  Component Value Date   INR 1.20 12/15/2017   Studies/Results: No results found.  Medications: . sodium chloride 10 mL/hr at 07/09/19 1154  . [MAR Hold] meropenem (MERREM) IV Stopped (07/08/19 2255)  . [MAR Hold] vancomycin 750 mg (07/05/19 1601)   . [MAR Hold] acidophilus  1 capsule Oral Daily  . [MAR Hold] amLODipine  10 mg Oral Daily  . [MAR Hold] aspirin EC  81 mg Oral Daily  . [MAR Hold] atorvastatin  40 mg Oral Daily  . Chlorhexidine Gluconate Cloth  6 each Topical Q0600  . [MAR Hold] Chlorhexidine Gluconate Cloth  6 each Topical Q0600  . [MAR Hold] clopidogrel  75 mg Oral Daily  . [MAR Hold] darbepoetin (ARANESP) injection - DIALYSIS  60 mcg Intravenous Q Tue-HD  . [MAR Hold] feeding supplement (NEPRO CARB STEADY)  237 mL Oral BID BM  . [MAR Hold] feeding  supplement (PRO-STAT SUGAR FREE 64)  30 mL Oral BID  . ferric citrate  420 mg Oral TID WC  . [MAR Hold] heparin  5,000 Units Subcutaneous Q8H  . [MAR Hold] insulin aspart  0-6 Units Subcutaneous TID WC  . [MAR Hold] insulin aspart protamine- aspart  10 Units Subcutaneous BID WC  . [MAR Hold] isosorbide mononitrate  30 mg Oral Daily  . [MAR Hold] metoprolol tartrate  25 mg Oral BID  . [MAR Hold] multivitamin  1 tablet Oral QHS  . [MAR Hold] mupirocin ointment  1 application Nasal BID  . [MAR Hold] nystatin  1 application Topical BID  . [MAR Hold] pantoprazole  40 mg Oral Daily  . [MAR Hold] predniSONE  5 mg Oral Q breakfast    Dialysis Orders: [facility] 3.25hrs, BFR400, [id number], EDW 69.5kg,2K/2.25Ca  Access:LU AVG Heparinnone Mircera54mcg q4wks - last4/27 Hectorol49mcg IV qHD   Assessment/Plan: 1. Fever/chills- thought to be 2/2 cellulitis/abscess: CT showed likely cellulitis of R buttock and possible pyelonephritis vs infected cyst.  Renal US5/9--> no hydro, some benign-appearing cysts. BC NGTD. On vanc/ meropenem (07/05/19-)Afebrile.  To have I&D of abscess today. Per surgery 2. Abnormal ovarian lesion- incidental findings on CT. will need additional workup. Per primary 3. ESRD- On HD TTS. Last K 3.0. HD overnight tolerated well.  Orders written for HD tomorrow per regular schedule.  4. Hypertension/volume- BP well controlled. Does not appear volume overloaded. Titrate down as tolerated. 5. Anemiaof CKD- last Hgb 9.9. ESA not given yesterday, will write to give w/HD tomorrow.  6. Secondary Hyperparathyroidism -Ca and phos at goal. Continue VDRA and binders.  7. Nutrition- Renal diet w/fluid restrictions.  8. Lupus- on chronic prednisone 9. CAD s/p stenting- per primary 10. DMT2- on insulin, per primary  Jen Mow, PA-C [facility] Pager: [phone] 07/09/2019,12:31 PM  LOS: 4 days

## 2019-07-09 NOTE — Progress Notes (Signed)
Pre Procedure note for inpatients:   Paula Bass has been scheduled for Procedure(s): IRRIGATION AND DEBRIDEMENT PERIRECTAL ABSCESS (N/A) today. The various methods of treatment have been discussed with the patient. After consideration of the risks, benefits and treatment options the patient has consented to the planned procedure.   The patient has been seen and labs reviewed. There are no changes in the patient's condition to prevent proceeding with the planned procedure today.  Recent labs:  Lab Results  Component Value Date   WBC 11.8 (H) 07/09/2019   HGB 9.9 (L) 07/09/2019   HCT 30.3 (L) 07/09/2019   PLT 230 07/09/2019   GLUCOSE 93 07/09/2019   CHOL 120 04/15/2018   TRIG 92 04/15/2018   HDL 59 04/15/2018   LDLCALC 43 04/15/2018   ALT 35 07/06/2019   AST 95 (H) 07/06/2019   NA 136 07/09/2019   K 3.0 (L) 07/09/2019   CL 96 (L) 07/09/2019   CREATININE 4.95 (H) 07/09/2019   BUN 12 07/09/2019   CO2 27 07/09/2019   INR 1.20 12/15/2017   HGBA1C 7.7 (H) 07/08/2019    Mickeal Skinner, MD 07/09/2019 12:30 PM

## 2019-07-09 NOTE — Progress Notes (Signed)
PROGRESS NOTE    Paula Bass  ERD:[id number] DOB: [birth year] DOA: 07/04/2019 PCP: Benito Mccreedy, MD    Brief Narrative:  49 year old lady with history of end-stage renal disease on dialysis, TTS schedule, hypertension, diabetes on insulin, lupus, unstable angina status post PCI, who presented to the emergency room with fever and chills for 3 days and found to have right buttock infection.  She was started on broad-spectrum antibiotics.  Going for surgical incision drainage today.   Assessment & Plan:   Principal Problem:   Cellulitis of buttock Active Problems:   ESRD (end stage renal disease) (HCC)   DM2 (diabetes mellitus, type 2) (HCC)   Lupus (HCC)   CAD (coronary artery disease)   Essential hypertension  Cellulitis/abscess of the right buttock: Now with localized abscess needing surgical incision and drainage.  Patient with broad-spectrum antibiotics.  Cultures negative so far.  Will appreciate surgical cultures.  ESRD on hemodialysis: On her schedule and getting dialysis.  Type 2 diabetes on insulin: Fairly stable after decreasing dose of insulin.  Hypertension: Blood pressure stable on Imdur and Norvasc.  CT scan evidence of pyelonephritis: Patient is anuric.  Treated with broad-spectrum antibiotics.  No abdominal pain.  Anemia of chronic disease: Secondary to ESRD.  Stable.   DVT prophylaxis: Heparin subcu Code Status: Full code Family Communication: None Disposition Plan: Status is: Inpatient  Remains inpatient appropriate because:IV treatments appropriate due to intensity of illness or inability to take PO   Dispo: The patient is from: Home              Anticipated d/c is to: Home              Anticipated d/c date is: 2 days              Patient currently is not medically stable to d/c. Patient needs inpatient procedure including incision and drainage today.         Consultants:   General surgery  Nephrology  Procedures:    Hemodialysis  Incision drainage  Antimicrobials:  Anti-infectives (From admission, onward)   Start     Dose/Rate Route Frequency Ordered Stop   07/09/19 0311  Vancomycin (VANCOCIN) 750-5 MG/150ML-% IVPB    Note to Pharmacy: Belinda Fisher   : cabinet override      07/09/19 0311 07/09/19 0317   07/07/19 1545  fluconazole (DIFLUCAN) IVPB 100 mg     100 mg 50 mL/hr over 60 Minutes Intravenous  Once 07/07/19 1540 07/07/19 1915   07/05/19 2200  [MAR Hold]  meropenem (MERREM) 500 mg in sodium chloride 0.9 % 100 mL IVPB     (MAR Hold since Wed 07/09/2019 at 1138.Hold Reason: Transfer to a Procedural area.)   500 mg 200 mL/hr over 30 Minutes Intravenous Daily at bedtime 07/05/19 0138     07/05/19 1200  [MAR Hold]  vancomycin (VANCOCIN) IVPB 750 mg/150 ml premix     (MAR Hold since Wed 07/09/2019 at 1138.Hold Reason: Transfer to a Procedural area.)   750 mg 150 mL/hr over 60 Minutes Intravenous Every T-Th-Sa (Hemodialysis) 07/05/19 0138     07/05/19 0200  Vancomycin (VANCOCIN) 1,500 mg in sodium chloride 0.9 % 500 mL IVPB     1,500 mg 250 mL/hr over 120 Minutes Intravenous  Once 07/05/19 0118 07/05/19 0722   07/05/19 0200  meropenem (MERREM) 1 g in sodium chloride 0.9 % 100 mL IVPB     1 g 200 mL/hr over 30 Minutes Intravenous  Once 07/05/19 0118  07/05/19 0353   07/04/19 1815  cefTRIAXone (ROCEPHIN) 1 g in sodium chloride 0.9 % 100 mL IVPB     1 g 200 mL/hr over 30 Minutes Intravenous  Once 07/04/19 1808 07/04/19 1946   07/04/19 1815  metroNIDAZOLE (FLAGYL) IVPB 500 mg     500 mg 100 mL/hr over 60 Minutes Intravenous  Once 07/04/19 1808 07/04/19 2122         Subjective: Patient seen and examined in the morning rounds with frequent procedure.  Patient stated her pain is improving on her buttock.  Afebrile.  Objective: Vitals:   07/09/19 0445 07/09/19 0500 07/09/19 1029 07/09/19 1141  BP: (!) 150/87 (!) 153/73 133/74   Pulse: 80 86 81   Resp: 18 18 18    Temp: 98 F (36.7 C) 98.2 F  (36.8 C) 98.3 F (36.8 C)   TempSrc: Oral Oral Oral   SpO2: 99% 100% 94%   Weight: 69.4 kg   69.4 kg  Height:    5' 0.98" (1.549 m)    Intake/Output Summary (Last 24 hours) at 07/09/2019 1234 Last data filed at 07/09/2019 1200 Gross per 24 hour  Intake 782 ml  Output 1000 ml  Net -218 ml   Filed Weights   07/09/19 0108 07/09/19 0445 07/09/19 1141  Weight: 70.4 kg 69.4 kg 69.4 kg    Examination:  General exam: Appears calm and comfortable  Respiratory system: Clear to auscultation. Respiratory effort normal. Extremities: Symmetric 5 x 5 power. Skin: No rashes, lesions or ulcers Right buttock, she has induration, tenderness and purulence noted. Psychiatry: Judgement and insight appear normal. Mood & affect appropriate.     Data Reviewed: I have personally reviewed following labs and imaging studies  CBC: Recent Labs  Lab 07/04/19 1238 07/06/19 1122 07/09/19 0559  WBC 17.7* 11.5* 11.8*  NEUTROABS  --  9.3*  --   HGB 12.0 10.1* 9.9*  HCT 38.4 31.5* 30.3*  MCV 98.0 95.7 94.1  PLT 162 150 960   Basic Metabolic Panel: Recent Labs  Lab 07/04/19 1238 07/06/19 1122 07/08/19 1120 07/09/19 0559  NA 134* 134*  --  136  K 4.4 3.5  --  3.0*  CL 94* 95*  --  96*  CO2 22 24  --  27  GLUCOSE 120* 115*  --  93  BUN 63* 25*  --  12  CREATININE 14.61* 8.16*  --  4.95*  CALCIUM 9.1 8.6*  --  8.1*  PHOS  --   --  4.9*  --    GFR: Estimated Creatinine Clearance: 12.5 mL/min (A) (by C-G formula based on SCr of 4.95 mg/dL (H)). Liver Function Tests: Recent Labs  Lab 07/04/19 1238 07/06/19 1122  AST 53* 95*  ALT 18 35  ALKPHOS 83 69  BILITOT 0.8 0.7  PROT 7.1 6.1*  ALBUMIN 3.0* 2.3*   Recent Labs  Lab 07/04/19 1238  LIPASE 50   No results for input(s): AMMONIA in the last 168 hours. Coagulation Profile: No results for input(s): INR, PROTIME in the last 168 hours. Cardiac Enzymes: No results for input(s): CKTOTAL, CKMB, CKMBINDEX, TROPONINI in the last 168  hours. BNP (last 3 results) No results for input(s): PROBNP in the last 8760 hours. HbA1C: Recent Labs    07/08/19 1447  HGBA1C 7.7*   CBG: Recent Labs  Lab 07/08/19 1232 07/08/19 1631 07/08/19 2116 07/09/19 0657 07/09/19 1123  GLUCAP 110* 109* 210* 75 105*   Lipid Profile: No results for input(s): CHOL, HDL, LDLCALC, TRIG,  CHOLHDL, LDLDIRECT in the last 72 hours. Thyroid Function Tests: No results for input(s): TSH, T4TOTAL, FREET4, T3FREE, THYROIDAB in the last 72 hours. Anemia Panel: Recent Labs    07/09/19 0559  TIBC 158*  IRON 61   Sepsis Labs: Recent Labs  Lab 07/06/19 1122 07/07/19 0728 07/08/19 0305  PROCALCITON 6.14 5.41 4.44    Recent Results (from the past 240 hour(s))  Respiratory Panel by RT PCR (Flu A&B, Covid) - Nasopharyngeal Swab     Status: None   Collection Time: 07/04/19 11:43 PM   Specimen: Nasopharyngeal Swab  Result Value Ref Range Status   SARS Coronavirus 2 by RT PCR NEGATIVE NEGATIVE Final    Comment: (NOTE) SARS-CoV-2 target nucleic acids are NOT DETECTED. The SARS-CoV-2 RNA is generally detectable in upper respiratoy specimens during the acute phase of infection. The lowest concentration of SARS-CoV-2 viral copies this assay can detect is 131 copies/mL. A negative result does not preclude SARS-Cov-2 infection and should not be used as the sole basis for treatment or other patient management decisions. A negative result may occur with  improper specimen collection/handling, submission of specimen other than nasopharyngeal swab, presence of viral mutation(s) within the areas targeted by this assay, and inadequate number of viral copies (<131 copies/mL). A negative result must be combined with clinical observations, patient history, and epidemiological information. The expected result is Negative. Fact Sheet for Patients:  PinkCheek.be Fact Sheet for Healthcare Providers:    GravelBags.it This test is not yet ap proved or cleared by the [place] FDA and  has been authorized for detection and/or diagnosis of SARS-CoV-2 by FDA under an Emergency Use Authorization (EUA). This EUA will remain  in effect (meaning this test can be used) for the duration of the COVID-19 declaration under Section 564(b)(1) of the Act, 21 U.S.C. section 360bbb-3(b)(1), unless the authorization is terminated or revoked sooner.    Influenza A by PCR NEGATIVE NEGATIVE Final   Influenza B by PCR NEGATIVE NEGATIVE Final    Comment: (NOTE) The Xpert Xpress SARS-CoV-2/FLU/RSV assay is intended as an aid in  the diagnosis of influenza from Nasopharyngeal swab specimens and  should not be used as a sole basis for treatment. Nasal washings and  aspirates are unacceptable for Xpert Xpress SARS-CoV-2/FLU/RSV  testing. Fact Sheet for Patients: PinkCheek.be Fact Sheet for Healthcare Providers: GravelBags.it This test is not yet approved or cleared by the [place] FDA and  has been authorized for detection and/or diagnosis of SARS-CoV-2 by  FDA under an Emergency Use Authorization (EUA). This EUA will remain  in effect (meaning this test can be used) for the duration of the  Covid-19 declaration under Section 564(b)(1) of the Act, 21  U.S.C. section 360bbb-3(b)(1), unless the authorization is  terminated or revoked. Performed at [facility], [place] [address], [place], [place] [address]   MRSA PCR Screening     Status: Abnormal   Collection Time: 07/05/19 12:45 AM   Specimen: Nasal Mucosa; Nasopharyngeal  Result Value Ref Range Status   MRSA by PCR POSITIVE (A) NEGATIVE Final    Comment:        The GeneXpert MRSA Assay (FDA approved for NASAL specimens only), is one component of a comprehensive MRSA colonization surveillance program. It is not intended to diagnose MRSA infection nor to  guide or monitor treatment for MRSA infections. RESULT CALLED TO, READ BACK BY AND VERIFIED WITHLendell Caprice RN 07/05/19 0347 JDW Performed at [facility]  [address], [place], [place] [address]   Culture, blood (single) w Reflex to ID Panel     Status: None (Preliminary result)   Collection Time: 07/05/19 12:59 AM   Specimen: BLOOD  Result Value Ref Range Status   Specimen Description BLOOD SITE NOT SPECIFIED  Final   Special Requests   Final    BOTTLES DRAWN AEROBIC ONLY Blood Culture adequate volume   Culture   Final    NO GROWTH 4 DAYS Performed at [facility], [address] [place], [place] [address]    Report Status PENDING  Incomplete  Culture, blood (Routine X 2) w Reflex to ID Panel     Status: None (Preliminary result)   Collection Time: 07/06/19 11:29 AM   Specimen: BLOOD  Result Value Ref Range Status   Specimen Description BLOOD RIGHT ANTECUBITAL  Final   Special Requests   Final    BOTTLES DRAWN AEROBIC AND ANAEROBIC Blood Culture adequate volume   Culture   Final    NO GROWTH 3 DAYS Performed at [facility], [place] [address] [place], [place] [address]    Report Status PENDING  Incomplete  Culture, blood (Routine X 2) w Reflex to ID Panel     Status: None (Preliminary result)   Collection Time: 07/06/19 11:34 AM   Specimen: BLOOD RIGHT HAND  Result Value Ref Range Status   Specimen Description BLOOD RIGHT HAND  Final   Special Requests   Final    BOTTLES DRAWN AEROBIC AND ANAEROBIC Blood Culture adequate volume   Culture   Final    NO GROWTH 3 DAYS Performed at [facility], [place] [address] [place], [place] [address]    Report Status PENDING  Incomplete  Surgical pcr screen     Status: Abnormal   Collection Time: 07/08/19  9:13 PM   Specimen: Nasal Mucosa; Nasal Swab  Result Value Ref Range Status   MRSA, PCR NEGATIVE NEGATIVE Final   Staphylococcus aureus POSITIVE (A) NEGATIVE Final    Comment: (NOTE) The Xpert SA Assay  (FDA approved for NASAL specimens in patients 62 years of age and older), is one component of a comprehensive surveillance program. It is not intended to diagnose infection nor to guide or monitor treatment. Performed at [facility], [place] [address], [place], [place] [address]          Radiology Studies: No results found.      Scheduled Meds: . [MAR Hold] acidophilus  1 capsule Oral Daily  . [MAR Hold] amLODipine  10 mg Oral Daily  . [MAR Hold] aspirin EC  81 mg Oral Daily  . [MAR Hold] atorvastatin  40 mg Oral Daily  . Chlorhexidine Gluconate Cloth  6 each Topical Q0600  . [MAR Hold] Chlorhexidine Gluconate Cloth  6 each Topical Q0600  . [MAR Hold] clopidogrel  75 mg Oral Daily  . [MAR Hold] darbepoetin (ARANESP) injection - DIALYSIS  60 mcg Intravenous Q Tue-HD  . [MAR Hold] feeding supplement (NEPRO CARB STEADY)  237 mL Oral BID BM  . [MAR Hold] feeding supplement (PRO-STAT SUGAR FREE 64)  30 mL Oral BID  . ferric citrate  420 mg Oral TID WC  . [MAR Hold] heparin  5,000 Units Subcutaneous Q8H  . [MAR Hold] insulin aspart  0-6 Units Subcutaneous TID WC  . [MAR Hold] insulin aspart protamine- aspart  10 Units Subcutaneous BID WC  . [MAR Hold] isosorbide mononitrate  30 mg Oral Daily  . [facility]  Hold] metoprolol tartrate  25 mg Oral BID  . [MAR Hold] multivitamin  1 tablet Oral QHS  . [MAR Hold] mupirocin ointment  1 application Nasal BID  . [MAR Hold] nystatin  1 application Topical BID  . [MAR Hold] pantoprazole  40 mg Oral Daily  . [MAR Hold] predniSONE  5 mg Oral Q breakfast   Continuous Infusions: . sodium chloride 10 mL/hr at 07/09/19 1154  . [MAR Hold] meropenem (MERREM) IV Stopped (07/08/19 2255)  . [MAR Hold] vancomycin 750 mg (07/05/19 1601)     LOS: 4 days    Time spent: 25 minutes    Barb Merino, MD [facility] Pager [phone]

## 2019-07-09 NOTE — Plan of Care (Signed)
  Problem: Pain Managment: Goal: General experience of comfort will improve Outcome: Progressing   

## 2019-07-09 NOTE — Anesthesia Procedure Notes (Signed)
Procedure Name: Intubation Date/Time: 07/09/2019 1:57 PM Performed by: Myna Bright, CRNA Pre-anesthesia Checklist: Patient identified, Emergency Drugs available, Suction available and Patient being monitored Patient Re-evaluated:Patient Re-evaluated prior to induction Oxygen Delivery Method: Circle system utilized Preoxygenation: Pre-oxygenation with 100% oxygen Induction Type: IV induction Ventilation: Mask ventilation without difficulty Laryngoscope Size: Mac and 3 Grade View: Grade I Tube type: Oral Tube size: 7.0 mm Number of attempts: 1 Airway Equipment and Method: Stylet Placement Confirmation: ETT inserted through vocal cords under direct vision,  positive ETCO2 and breath sounds checked- equal and bilateral Secured at: 21 cm Tube secured with: Tape Dental Injury: Teeth and Oropharynx as per pre-operative assessment

## 2019-07-09 NOTE — Anesthesia Preprocedure Evaluation (Addendum)
Anesthesia Evaluation  Patient identified by MRN, date of birth, ID band Patient awake    Reviewed: Allergy & Precautions, NPO status , Patient's Chart, lab work & pertinent test results, reviewed documented beta blocker date and time   History of Anesthesia Complications Negative for: history of anesthetic complications  Airway Mallampati: II  TM Distance: >3 FB Neck ROM: Full    Dental  (+) Dental Advisory Given, Partial Upper   Pulmonary neg pulmonary ROS,    Pulmonary exam normal        Cardiovascular hypertension, Pt. on home beta blockers and Pt. on medications + CAD and + Cardiac Stents  Normal cardiovascular exam   '20 Myoperfusion - Nuclear stress EF: 66%. No wall motion abnormality There was no ST segment deviation noted during stress. This is a low risk study. No perfusion defects. The study is normal.   '20 TTE - EF 60 to 65%. Mildly increased left ventricular hypertrophy. Mild MR, trivial PR.     Neuro/Psych negative neurological ROS  negative psych ROS   GI/Hepatic Neg liver ROS, GERD  Medicated and Controlled, Elevated AST    Endo/Other  diabetes, Insulin Dependent  Renal/GU ESRF and DialysisRenal disease     Musculoskeletal negative musculoskeletal ROS (+)   Abdominal   Peds  Hematology  (+) anemia ,  On plavix    Anesthesia Other Findings Lupus  Reproductive/Obstetrics                            Anesthesia Physical Anesthesia Plan  ASA: III  Anesthesia Plan: General   Post-op Pain Management:    Induction: Intravenous  PONV Risk Score and Plan: 3 and Treatment may vary due to age or medical condition, Ondansetron and Midazolam  Airway Management Planned: Oral ETT  Additional Equipment: None  Intra-op Plan:   Post-operative Plan: Extubation in OR  Informed Consent: I have reviewed the patients History and Physical, chart, labs and discussed the  procedure including the risks, benefits and alternatives for the proposed anesthesia with the patient or authorized representative who has indicated his/her understanding and acceptance.     Dental advisory given  Plan Discussed with: CRNA and Anesthesiologist  Anesthesia Plan Comments:        Anesthesia Quick Evaluation

## 2019-07-09 NOTE — Anesthesia Postprocedure Evaluation (Signed)
Anesthesia Post Note  Patient: Paula Bass  Procedure(s) Performed: IRRIGATION AND DEBRIDEMEN right buttock ABSCESS (N/A Buttocks)     Patient location during evaluation: PACU Anesthesia Type: General Level of consciousness: awake and alert Pain management: pain level controlled Vital Signs Assessment: post-procedure vital signs reviewed and stable Respiratory status: spontaneous breathing, nonlabored ventilation, respiratory function stable and patient connected to nasal cannula oxygen Cardiovascular status: blood pressure returned to baseline and stable Postop Assessment: no apparent nausea or vomiting Anesthetic complications: no    Last Vitals:  Vitals:   07/09/19 1541 07/09/19 1556  BP: 137/84 (!) 145/81  Pulse: 74 75  Resp: 20 (!) 23  Temp:    SpO2: 98% 97%    Last Pain:  Vitals:   07/09/19 1541  TempSrc:   PainSc: Asleep                 Effie Berkshire

## 2019-07-09 NOTE — Progress Notes (Signed)
Inpatient Diabetes Program Recommendations  AACE/ADA: New Consensus Statement on Inpatient Glycemic Control (2015)  Target Ranges:  Prepandial:   less than 140 mg/dL      Peak postprandial:   less than 180 mg/dL (1-2 hours)      Critically ill patients:  140 - 180 mg/dL   Lab Results  Component Value Date   GLUCAP 75 07/09/2019   HGBA1C 7.7 (H) 07/08/2019    Review of Glycemic Control Results for Paula Bass, Paula Bass (MRN [id number]) as of 07/09/2019 10:56  Ref. Range 07/08/2019 11:35 07/08/2019 12:32 07/08/2019 16:31 07/08/2019 21:16  Glucose-Capillary Latest Ref Range: 70 - 99 mg/dL 65 (L) 110 (H) 109 (H) 210 (H)   Diabetes history: Type 2 DM Outpatient Diabetes medications: Novolog 40 units QAM, 30 units QHS Current orders for Inpatient glycemic control: Novolog 70/30 10 units BID, Novolog 0-6 units TID  Inpatient Diabetes Program Recommendations:    Noted hypoglycemia yesterday of 65 mg/dL and subsequent insulin order changes. In agreement. Will follow.  Thanks, Bronson Curb, MSN, RNC-OB Diabetes Coordinator [phone] (8a-5p)

## 2019-07-09 NOTE — Op Note (Signed)
Preoperative diagnosis: Perirectal abscess  Postoperative diagnosis: same   Procedure: incision and drainage of perirectal  Surgeon: Gurney Maxin, M.D.  Asst: none  Anesthesia: General anesthesia  Indications for procedure: Abbegail Matuska is a 49 y.o. year old female with symptoms of right buttock infection.  Description of procedure: The patient was brought into the operative suite. Anesthesia was administered with general anesthesia. WHO checklist was applied. The patient was then placed in prone position. The area was prepped and draped in the usual sterile fashion.  A stab incision was made and large amount of bloody abscess was evacuated and sent for culture. 2  Counter incisions were mae one at the inferior aspect of the wound and one at the superior aspect of the wound. 1/4" penrose was placed through the wound and sutures into a round band to keep each of the 3 incisions open. The wound was irrigated and interrogated with kelly clamp to ensure all aspects were drained. The abscess cavity was deep and was along the anal canal and seemed to be deep the gluteal muscle.  Findings: large abscess of right gluteus that had perirectal extension  Specimen: perirectal abscess  Implant: 1/4" penrose in 2 loops   Blood loss: 10 ml  Local anesthesia: none  Complications: none  Gurney Maxin, M.D. General, Bariatric, & Minimally Invasive Surgery [facility]

## 2019-07-09 NOTE — Transfer of Care (Signed)
Immediate Anesthesia Transfer of Care Note  Patient: Paula Bass  Procedure(s) Performed: [place] right buttock ABSCESS (N/A Buttocks)  Patient Location: PACU  Anesthesia Type:General  Level of Consciousness: drowsy, patient cooperative and responds to stimulation  Airway & Oxygen Therapy: Patient Spontanous Breathing  Post-op Assessment: Report given to RN and Post -op Vital signs reviewed and stable  Post vital signs: Reviewed and stable  Last Vitals:  Vitals Value Taken Time  BP 116/76 07/09/19 1441  Temp    Pulse 73 07/09/19 1443  Resp 31 07/09/19 1443  SpO2 100 % 07/09/19 1443  Vitals shown include unvalidated device data.  Last Pain:  Vitals:   07/09/19 1141  TempSrc:   PainSc: Asleep      Patients Stated Pain Goal: 0 (53/96/72 8979)  Complications: No apparent anesthesia complications

## 2019-07-10 LAB — GLUCOSE, CAPILLARY
Glucose-Capillary: 172 mg/dL — ABNORMAL HIGH (ref 70–99)
Glucose-Capillary: 248 mg/dL — ABNORMAL HIGH (ref 70–99)
Glucose-Capillary: 230 mg/dL — ABNORMAL HIGH (ref 70–99)
Glucose-Capillary: 144 mg/dL — ABNORMAL HIGH (ref 70–99)

## 2019-07-10 LAB — CULTURE, BLOOD (SINGLE)
Culture: NO GROWTH
Special Requests: ADEQUATE

## 2019-07-10 MED ORDER — HEPARIN SODIUM (PORCINE) 1000 UNIT/ML DIALYSIS: 1000.0000 [IU] | INTRAMUSCULAR | Status: DC | PRN

## 2019-07-10 MED ORDER — LIDOCAINE HCL (PF) 1 % IJ SOLN: 5.0000 mL | INTRAMUSCULAR | Status: DC | PRN

## 2019-07-10 MED ORDER — LIDOCAINE-PRILOCAINE 2.5-2.5 % EX CREA: 1.0000 "application " | TOPICAL_CREAM | CUTANEOUS | Status: DC | PRN

## 2019-07-10 MED ORDER — SODIUM CHLORIDE 0.9 % IV SOLN: 100.0000 mL | INTRAVENOUS | Status: DC | PRN

## 2019-07-10 MED ORDER — OXYCODONE HCL 5 MG PO TABS: 5.0000 mg | ORAL_TABLET | ORAL | 0 refills | Status: DC | PRN

## 2019-07-10 MED ORDER — ACETAMINOPHEN 325 MG PO TABS: 650.0000 mg | ORAL_TABLET | ORAL | Status: DC | PRN

## 2019-07-10 MED ORDER — ALTEPLASE 2 MG IJ SOLR: 2.0000 mg | Freq: Once | INTRAMUSCULAR | Status: DC | PRN

## 2019-07-10 MED ORDER — PENTAFLUOROPROP-TETRAFLUOROETH EX AERO: 1.0000 "application " | INHALATION_SPRAY | CUTANEOUS | Status: DC | PRN

## 2019-07-10 NOTE — Progress Notes (Signed)
1 Day Post-Op  Subjective: Patient feels much better today after drainage.  No new complaints  ROS: See above, otherwise other systems negative  Objective: Vital signs in last 24 hours: Temp:  [98 F (36.7 C)-98.7 F (37.1 C)] 98 F (36.7 C) (05/13 0911) Pulse Rate:  [71-81] 73 (05/13 0911) Resp:  [18-50] 18 (05/13 0911) BP: (106-145)/(60-84) 106/64 (05/13 0911) SpO2:  [94 %-100 %] 98 % (05/13 0911) Weight:  [69.4 kg] 69.4 kg (05/12 1141) Last BM Date: (07/08/19)  Intake/Output from previous day: 05/12 0701 - 05/13 0700 In: 1230.1 [P.O.:360; I.V.:450; IV Piggyback:420.1] Out: 20 [Blood:20] Intake/Output this shift: No intake/output data recorded.  PE: Buttock: induration is improved but still present as expected.  Incisions are present with penrose in place.  No further purulent drainage.  Much less tender  Lab Results:  Recent Labs    07/09/19 0559  WBC 11.8*  HGB 9.9*  HCT 30.3*  PLT 230   BMET Recent Labs    07/09/19 0559  NA 136  K 3.0*  CL 96*  CO2 27  GLUCOSE 93  BUN 12  CREATININE 4.95*  CALCIUM 8.1*   PT/INR No results for input(s): LABPROT, INR in the last 72 hours. CMP     Component Value Date/Time   NA 136 07/09/2019 0559   NA 140 04/15/2018 0948   K 3.0 (L) 07/09/2019 0559   CL 96 (L) 07/09/2019 0559   CO2 27 07/09/2019 0559   GLUCOSE 93 07/09/2019 0559   BUN 12 07/09/2019 0559   BUN 43 (H) 04/15/2018 0948   CREATININE 4.95 (H) 07/09/2019 0559   CALCIUM 8.1 (L) 07/09/2019 0559   PROT 6.1 (L) 07/06/2019 1122   PROT 6.7 04/15/2018 0948   ALBUMIN 2.3 (L) 07/06/2019 1122   ALBUMIN 3.7 (L) 04/15/2018 0948   AST 95 (H) 07/06/2019 1122   ALT 35 07/06/2019 1122   ALKPHOS 69 07/06/2019 1122   BILITOT 0.7 07/06/2019 1122   BILITOT 0.3 04/15/2018 0948   GFRNONAA 10 (L) 07/09/2019 0559   GFRAA 11 (L) 07/09/2019 0559   Lipase     Component Value Date/Time   LIPASE 50 07/04/2019 1238       Studies/Results: No results  found.  Anti-infectives: Anti-infectives (From admission, onward)   Start     Dose/Rate Route Frequency Ordered Stop   07/09/19 0311  Vancomycin (VANCOCIN) 750-5 MG/150ML-% IVPB    Note to Pharmacy: Belinda Fisher   : cabinet override      07/09/19 0311 07/09/19 0317   07/07/19 1545  fluconazole (DIFLUCAN) IVPB 100 mg     100 mg 50 mL/hr over 60 Minutes Intravenous  Once 07/07/19 1540 07/07/19 1915   07/05/19 2200  meropenem (MERREM) 500 mg in sodium chloride 0.9 % 100 mL IVPB  Status:  Discontinued     500 mg 200 mL/hr over 30 Minutes Intravenous Daily at bedtime 07/05/19 0138 07/10/19 0712   07/05/19 1200  vancomycin (VANCOCIN) IVPB 750 mg/150 ml premix     750 mg 150 mL/hr over 60 Minutes Intravenous Every T-Th-Sa (Hemodialysis) 07/05/19 0138     07/05/19 0200  Vancomycin (VANCOCIN) 1,500 mg in sodium chloride 0.9 % 500 mL IVPB     1,500 mg 250 mL/hr over 120 Minutes Intravenous  Once 07/05/19 0118 07/05/19 0722   07/05/19 0200  meropenem (MERREM) 1 g in sodium chloride 0.9 % 100 mL IVPB     1 g 200 mL/hr over 30 Minutes Intravenous  Once  07/05/19 0118 07/05/19 0353   07/04/19 1815  cefTRIAXone (ROCEPHIN) 1 g in sodium chloride 0.9 % 100 mL IVPB     1 g 200 mL/hr over 30 Minutes Intravenous  Once 07/04/19 1808 07/04/19 1946   07/04/19 1815  metroNIDAZOLE (FLAGYL) IVPB 500 mg     500 mg 100 mL/hr over 60 Minutes Intravenous  Once 07/04/19 1808 07/04/19 2122       Assessment/Plan MMP  POD 1, s/p I&D of perirectal/right gluteal abscess, LK, 5/12 -cont penrose in place.   -may shower -14 days of abx therapy recommended per LK.  cx shows gram + cocci -stable for DC home from our standpoint -pain med has been sent to pharmacy and follow up is currently being arranged with our office. -no further surgical needs at this time.    FEN - renal diet VTE - plavix, heparin ID - Vanc   LOS: 5 days    Henreitta Cea , [facility] Surgery 07/10/2019, 10:15 AM Please  see Amion for pager number during day hours 7:00am-4:30pm or 7:00am -11:30am on weekends

## 2019-07-10 NOTE — Plan of Care (Signed)
  Problem: Nutrition: Goal: Adequate nutrition will be maintained Outcome: Progressing   Problem: Pain Managment: Goal: General experience of comfort will improve 07/10/2019 0739 by Dolores Hoose, RN Outcome: Progressing 07/09/2019 1838 by Dolores Hoose, RN Outcome: Progressing   Problem: Skin Integrity: Goal: Risk for impaired skin integrity will decrease Outcome: Progressing   Problem: Fluid Volume: Goal: Compliance with measures to maintain balanced fluid volume will improve Outcome: Progressing

## 2019-07-10 NOTE — Discharge Instructions (Signed)
Leave penrose drains in place.  May shower or sit in a bathtub to help with cleanliness and pain  How to Take a Sitz Bath A sitz bath is a warm water bath that may be used to care for your rectum, genital area, or the area between your rectum and genitals (perineum). For a sitz bath, the water only comes up to your hips and covers your buttocks. A sitz bath may done at home in a bathtub or with a portable sitz bath that fits over the toilet. Your health care provider may recommend a sitz bath to help:  Relieve pain and discomfort after delivering a baby.  Relieve pain and itching from hemorrhoids or anal fissures.  Relieve pain after certain surgeries.  Relax muscles that are sore or tight. How to take a sitz bath Take 3-4 sitz baths a day, or as many as told by your health care provider. Bathtub sitz bath To take a sitz bath in a bathtub: 1. Partially fill a bathtub with warm water. The water should be deep enough to cover your hips and buttocks when you are sitting in the tub. 2. If your health care provider told you to put medicine in the water, follow his or her instructions. 3. Sit in the water. 4. Open the tub drain a little, and leave it open during your bath. 5. Turn on the warm water again, enough to replace the water that is draining out. Keep the water running throughout your bath. This helps keep the water at the right level and the right temperature. 6. Soak in the water for 15-20 minutes, or as long as told by your health care provider. 7. When you are done, be careful when you stand up. You may feel dizzy. 8. After the sitz bath, pat yourself dry. Do not rub your skin to dry it.  Over-the-toilet sitz bath To take a sitz bath with an over-the-toilet basin: 1. Follow the manufacturer's instructions. 2. Fill the basin with warm water. 3. If your health care provider told you to put medicine in the water, follow his or her instructions. 4. Sit on the seat. Make sure the  water covers your buttocks and perineum. 5. Soak in the water for 15-20 minutes, or as long as told by your health care provider. 6. After the sitz bath, pat yourself dry. Do not rub your skin to dry it. 7. Clean and dry the basin between uses. 8. Discard the basin if it cracks, or according to the manufacturer's instructions. Contact a health care provider if:  Your symptoms get worse. Do not continue with sitz baths if your symptoms get worse.  You have new symptoms. If this happens, do not continue with sitz baths until you talk with your health care provider. Summary  A sitz bath is a warm water bath in which the water only comes up to your hips and covers your buttocks.  A sitz bath may help relieve itching, relieve pain, and relax muscles that are sore or tight in the lower part of your body, including your genital area.  Take 3-4 sitz baths a day, or as many as told by your health care provider. Soak in the water for 15-20 minutes.  Do not continue with sitz baths if your symptoms get worse. This information is not intended to replace advice given to you by your health care provider. Make sure you discuss any questions you have with your health care provider. Document Revised: 07/15/2018 Document Reviewed: 02/15/2017  Elsevier Patient Education  [employer].

## 2019-07-10 NOTE — Plan of Care (Signed)
  Problem: Education: Goal: Knowledge of General Education information will improve Description: Including pain rating scale, medication(s)/side effects and non-pharmacologic comfort measures Outcome: Progressing   Problem: Pain Managment: Goal: General experience of comfort will improve Outcome: Progressing   

## 2019-07-10 NOTE — Progress Notes (Signed)
PROGRESS NOTE    Paula Bass  WER:[id number] DOB: 1970-09-15 DOA: 07/04/2019 PCP: Benito Mccreedy, MD    Brief Narrative:  49 year old lady with history of end-stage renal disease on dialysis, TTS schedule, hypertension, diabetes on insulin, lupus, unstable angina status post PCI, who presented to the emergency room with fever and chills for 3 days and found to have right buttock infection.  She was started on broad-spectrum antibiotics.   After localizing sign of abscess, underwent incision drainage on 5/12 at operating room. Pus cultures growing gram-positive cocci.   Assessment & Plan:   Principal Problem:   Cellulitis of buttock Active Problems:   ESRD (end stage renal disease) (HCC)   DM2 (diabetes mellitus, type 2) (HCC)   Lupus (HCC)   CAD (coronary artery disease)   Essential hypertension  Cellulitis/abscess of the right buttock:  Treated with broad-spectrum antibiotics, ultimately surgical incision drainage.   Day 1 postop.  On examination, further wound care plan as per surgery.   Growing gram-positive cocci, already received more than 5 days of broad-spectrum antibiotics.  Will discontinue meropenem.  Keep on vancomycin until final identification.   Hopefully, may be able to change to oral antibiotics by tomorrow depends upon wound inspection.    ESRD on hemodialysis: On her schedule and getting dialysis.  Type 2 diabetes on insulin: Fairly stable after decreasing dose of insulin.  Hypertension: Blood pressure stable on Imdur and Norvasc.  CT scan evidence of pyelonephritis: Patient is anuric.  Treated with broad-spectrum antibiotics.  No abdominal pain.  Finished 5 days of meropenem today.  Anemia of chronic disease: Secondary to ESRD.  Stable.   DVT prophylaxis: Heparin subcu Code Status: Full code Family Communication: None Disposition Plan: Status is: Inpatient  Remains inpatient appropriate because:IV treatments appropriate due to intensity  of illness or inability to take PO   Dispo: The patient is from: Home              Anticipated d/c is to: Home              Anticipated d/c date is: 2 days              Patient currently is not medically stable to d/c.  Needs IV antibiotics.   Consultants:   General surgery  Nephrology  Procedures:   Hemodialysis  Incision drainage  Antimicrobials:  Anti-infectives (From admission, onward)   Start     Dose/Rate Route Frequency Ordered Stop   07/09/19 0311  Vancomycin (VANCOCIN) 750-5 MG/150ML-% IVPB    Note to Pharmacy: Belinda Fisher   : cabinet override      07/09/19 0311 07/09/19 0317   07/07/19 1545  fluconazole (DIFLUCAN) IVPB 100 mg     100 mg 50 mL/hr over 60 Minutes Intravenous  Once 07/07/19 1540 07/07/19 1915   07/05/19 2200  meropenem (MERREM) 500 mg in sodium chloride 0.9 % 100 mL IVPB  Status:  Discontinued     500 mg 200 mL/hr over 30 Minutes Intravenous Daily at bedtime 07/05/19 0138 07/10/19 0712   07/05/19 1200  vancomycin (VANCOCIN) IVPB 750 mg/150 ml premix     750 mg 150 mL/hr over 60 Minutes Intravenous Every T-Th-Sa (Hemodialysis) 07/05/19 0138     07/05/19 0200  Vancomycin (VANCOCIN) 1,500 mg in sodium chloride 0.9 % 500 mL IVPB     1,500 mg 250 mL/hr over 120 Minutes Intravenous  Once 07/05/19 0118 07/05/19 0722   07/05/19 0200  meropenem (MERREM) 1 g in sodium chloride  0.9 % 100 mL IVPB     1 g 200 mL/hr over 30 Minutes Intravenous  Once 07/05/19 0118 07/05/19 0353   07/04/19 1815  cefTRIAXone (ROCEPHIN) 1 g in sodium chloride 0.9 % 100 mL IVPB     1 g 200 mL/hr over 30 Minutes Intravenous  Once 07/04/19 1808 07/04/19 1946   07/04/19 1815  metroNIDAZOLE (FLAGYL) IVPB 500 mg     500 mg 100 mL/hr over 60 Minutes Intravenous  Once 07/04/19 1808 07/04/19 2122         Subjective: Patient seen and examined.  No overnight events.  Has some pain on her buttock but better than before.  Objective: Vitals:   07/09/19 2214 07/09/19 2217 07/10/19  0500 07/10/19 0911  BP: 114/66 131/67 135/60 106/64  Pulse: 81 80 78 73  Resp:  18  18  Temp: 98.5 F (36.9 C) 98.7 F (37.1 C) 98.1 F (36.7 C) 98 F (36.7 C)  TempSrc: Oral Oral Oral Oral  SpO2: 100% 100% 100% 98%  Weight:      Height:        Intake/Output Summary (Last 24 hours) at 07/10/2019 1010 Last data filed at 07/10/2019 0700 Gross per 24 hour  Intake 1230.11 ml  Output 20 ml  Net 1210.11 ml   Filed Weights   07/09/19 0108 07/09/19 0445 07/09/19 1141  Weight: 70.4 kg 69.4 kg 69.4 kg    Examination:  General exam: Appears calm and comfortable  Respiratory system: Clear to auscultation. Respiratory effort normal. Extremities: Symmetric 5 x 5 power. Skin: No rashes, lesions or ulcers Right buttock, dressing on.  Not removed by me. Psychiatry: Judgement and insight appear normal. Mood & affect anxious.    Data Reviewed: I have personally reviewed following labs and imaging studies  CBC: Recent Labs  Lab 07/04/19 1238 07/06/19 1122 07/09/19 0559  WBC 17.7* 11.5* 11.8*  NEUTROABS  --  9.3*  --   HGB 12.0 10.1* 9.9*  HCT 38.4 31.5* 30.3*  MCV 98.0 95.7 94.1  PLT 162 150 017   Basic Metabolic Panel: Recent Labs  Lab 07/04/19 1238 07/06/19 1122 07/08/19 1120 07/09/19 0559  NA 134* 134*  --  136  K 4.4 3.5  --  3.0*  CL 94* 95*  --  96*  CO2 22 24  --  27  GLUCOSE 120* 115*  --  93  BUN 63* 25*  --  12  CREATININE 14.61* 8.16*  --  4.95*  CALCIUM 9.1 8.6*  --  8.1*  PHOS  --   --  4.9*  --    GFR: Estimated Creatinine Clearance: 12.5 mL/min (A) (by C-G formula based on SCr of 4.95 mg/dL (H)). Liver Function Tests: Recent Labs  Lab 07/04/19 1238 07/06/19 1122  AST 53* 95*  ALT 18 35  ALKPHOS 83 69  BILITOT 0.8 0.7  PROT 7.1 6.1*  ALBUMIN 3.0* 2.3*   Recent Labs  Lab 07/04/19 1238  LIPASE 50   No results for input(s): AMMONIA in the last 168 hours. Coagulation Profile: No results for input(s): INR, PROTIME in the last 168  hours. Cardiac Enzymes: No results for input(s): CKTOTAL, CKMB, CKMBINDEX, TROPONINI in the last 168 hours. BNP (last 3 results) No results for input(s): PROBNP in the last 8760 hours. HbA1C: Recent Labs    07/08/19 1447  HGBA1C 7.7*   CBG: Recent Labs  Lab 07/09/19 0657 07/09/19 1123 07/09/19 1641 07/09/19 2239 07/10/19 0641  GLUCAP 75 105* 123* 270* 172*  Lipid Profile: No results for input(s): CHOL, HDL, LDLCALC, TRIG, CHOLHDL, LDLDIRECT in the last 72 hours. Thyroid Function Tests: No results for input(s): TSH, T4TOTAL, FREET4, T3FREE, THYROIDAB in the last 72 hours. Anemia Panel: Recent Labs    07/09/19 0559  TIBC 158*  IRON 61   Sepsis Labs: Recent Labs  Lab 07/06/19 1122 07/07/19 0728 07/08/19 0305  PROCALCITON 6.14 5.41 4.44    Recent Results (from the past 240 hour(s))  Respiratory Panel by RT PCR (Flu A&B, Covid) - Nasopharyngeal Swab     Status: None   Collection Time: 07/04/19 11:43 PM   Specimen: Nasopharyngeal Swab  Result Value Ref Range Status   SARS Coronavirus 2 by RT PCR NEGATIVE NEGATIVE Final    Comment: (NOTE) SARS-CoV-2 target nucleic acids are NOT DETECTED. The SARS-CoV-2 RNA is generally detectable in upper respiratoy specimens during the acute phase of infection. The lowest concentration of SARS-CoV-2 viral copies this assay can detect is 131 copies/mL. A negative result does not preclude SARS-Cov-2 infection and should not be used as the sole basis for treatment or other patient management decisions. A negative result may occur with  improper specimen collection/handling, submission of specimen other than nasopharyngeal swab, presence of viral mutation(s) within the areas targeted by this assay, and inadequate number of viral copies (<131 copies/mL). A negative result must be combined with clinical observations, patient history, and epidemiological information. The expected result is Negative. Fact Sheet for Patients:   PinkCheek.be Fact Sheet for Healthcare Providers:  GravelBags.it This test is not yet ap proved or cleared by the [place] FDA and  has been authorized for detection and/or diagnosis of SARS-CoV-2 by FDA under an Emergency Use Authorization (EUA). This EUA will remain  in effect (meaning this test can be used) for the duration of the COVID-19 declaration under Section 564(b)(1) of the Act, 21 U.S.C. section 360bbb-3(b)(1), unless the authorization is terminated or revoked sooner.    Influenza A by PCR NEGATIVE NEGATIVE Final   Influenza B by PCR NEGATIVE NEGATIVE Final    Comment: (NOTE) The Xpert Xpress SARS-CoV-2/FLU/RSV assay is intended as an aid in  the diagnosis of influenza from Nasopharyngeal swab specimens and  should not be used as a sole basis for treatment. Nasal washings and  aspirates are unacceptable for Xpert Xpress SARS-CoV-2/FLU/RSV  testing. Fact Sheet for Patients: PinkCheek.be Fact Sheet for Healthcare Providers: GravelBags.it This test is not yet approved or cleared by the [place] FDA and  has been authorized for detection and/or diagnosis of SARS-CoV-2 by  FDA under an Emergency Use Authorization (EUA). This EUA will remain  in effect (meaning this test can be used) for the duration of the  Covid-19 declaration under Section 564(b)(1) of the Act, 21  U.S.C. section 360bbb-3(b)(1), unless the authorization is  terminated or revoked. Performed at [facility], [place] [address], [place], [place] [address]   MRSA PCR Screening     Status: Abnormal   Collection Time: 07/05/19 12:45 AM   Specimen: Nasal Mucosa; Nasopharyngeal  Result Value Ref Range Status   MRSA by PCR POSITIVE (A) NEGATIVE Final    Comment:        The GeneXpert MRSA Assay (FDA approved for NASAL specimens only), is one component of a comprehensive MRSA  colonization surveillance program. It is not intended to diagnose MRSA infection nor to guide or monitor treatment for MRSA infections. RESULT CALLED TO, READ BACK BY AND VERIFIED WITH: Lendell Caprice RN 07/05/19 0347 JDW  Performed at [facility], [place] [address], [place], [place] [address]   Culture, blood (single) w Reflex to ID Panel     Status: None   Collection Time: 07/05/19 12:59 AM   Specimen: BLOOD  Result Value Ref Range Status   Specimen Description BLOOD SITE NOT SPECIFIED  Final   Special Requests   Final    BOTTLES DRAWN AEROBIC ONLY Blood Culture adequate volume   Culture   Final    NO GROWTH 5 DAYS Performed at [facility], [address], [place], [place] [address]    Report Status 07/10/2019 FINAL  Final  Culture, blood (Routine X 2) w Reflex to ID Panel     Status: None (Preliminary result)   Collection Time: 07/06/19 11:29 AM   Specimen: BLOOD  Result Value Ref Range Status   Specimen Description BLOOD RIGHT ANTECUBITAL  Final   Special Requests   Final    BOTTLES DRAWN AEROBIC AND ANAEROBIC Blood Culture adequate volume   Culture   Final    NO GROWTH 4 DAYS Performed at [facility], [place] [address] [place], [place] [address]    Report Status PENDING  Incomplete  Culture, blood (Routine X 2) w Reflex to ID Panel     Status: None (Preliminary result)   Collection Time: 07/06/19 11:34 AM   Specimen: BLOOD RIGHT HAND  Result Value Ref Range Status   Specimen Description BLOOD RIGHT HAND  Final   Special Requests   Final    BOTTLES DRAWN AEROBIC AND ANAEROBIC Blood Culture adequate volume   Culture   Final    NO GROWTH 4 DAYS Performed at [facility], [place] [address] [place], [place] [address]    Report Status PENDING  Incomplete  Surgical pcr screen     Status: Abnormal   Collection Time: 07/08/19  9:13 PM   Specimen: Nasal Mucosa; Nasal Swab  Result Value Ref Range Status   MRSA, PCR NEGATIVE NEGATIVE Final   Staphylococcus  aureus POSITIVE (A) NEGATIVE Final    Comment: (NOTE) The Xpert SA Assay (FDA approved for NASAL specimens in patients 26 years of age and older), is one component of a comprehensive surveillance program. It is not intended to diagnose infection nor to guide or monitor treatment. Performed at [facility], [place] [address], [place], [place] [address]   Aerobic/Anaerobic Culture (surgical/deep wound)     Status: None (Preliminary result)   Collection Time: 07/09/19  2:15 PM   Specimen: PATH Other; Body Fluid  Result Value Ref Range Status   Specimen Description ABSCESS  Final   Special Requests RIGHT BUTTOCK ABSCESS SPEC A  Final   Gram Stain   Final    ABUNDANT WBC PRESENT, PREDOMINANTLY PMN MODERATE GRAM POSITIVE COCCI IN PAIRS Performed at [facility], [address] [place], [place] [address]    Culture PENDING  Incomplete   Report Status PENDING  Incomplete         Radiology Studies: No results found.      Scheduled Meds: . acidophilus  1 capsule Oral Daily  . amLODipine  10 mg Oral Daily  . aspirin EC  81 mg Oral Daily  . atorvastatin  40 mg Oral Daily  . Chlorhexidine Gluconate Cloth  6 each Topical Q0600  . clopidogrel  75 mg Oral Daily  . darbepoetin (ARANESP) injection - DIALYSIS  60 mcg Intravenous Q Thu-HD  . feeding supplement (NEPRO CARB  STEADY)  237 mL Oral BID BM  . feeding supplement (PRO-STAT SUGAR FREE 64)  30 mL Oral BID  . ferric citrate  420 mg Oral TID WC  . heparin  5,000 Units Subcutaneous Q8H  . insulin aspart  0-6 Units Subcutaneous TID WC  . insulin aspart protamine- aspart  10 Units Subcutaneous BID WC  . isosorbide mononitrate  30 mg Oral Daily  . metoprolol tartrate  25 mg Oral BID  . multivitamin  1 tablet Oral QHS  . nystatin  1 application Topical BID  . pantoprazole  40 mg Oral Daily  . predniSONE  5 mg Oral Q breakfast   Continuous Infusions: . vancomycin 750 mg (07/05/19 1601)     LOS: 5 days    Time spent:  25 minutes    Barb Merino, MD [facility] Pager [phone]

## 2019-07-10 NOTE — Progress Notes (Signed)
[facility] Progress Note   Subjective:   Patient seen and examined at bedside.  Pain improved today after I&D yesterday.  Denies n/v/d, CP, SOB, weakness and fatigue.  Having some dizziness today, has history of vertigo.    Objective Vitals:   07/09/19 2214 07/09/19 2217 07/10/19 0500 07/10/19 0911  BP: 114/66 131/67 135/60 106/64  Pulse: 81 80 78 73  Resp:  18  18  Temp: 98.5 F (36.9 C) 98.7 F (37.1 C) 98.1 F (36.7 C) 98 F (36.7 C)  TempSrc: Oral Oral Oral Oral  SpO2: 100% 100% 100% 98%  Weight:      Height:       Physical Exam General:WDWN, obese female in NAD Heart:RRR Lungs:CTAB Abdomen:soft, NTND Extremities:no LE edema Dialysis Access: LU AVG +b   Filed Weights   07/09/19 0108 07/09/19 0445 07/09/19 1141  Weight: 70.4 kg 69.4 kg 69.4 kg    Intake/Output Summary (Last 24 hours) at 07/10/2019 1156 Last data filed at 07/10/2019 0900 Gross per 24 hour  Intake 1030.11 ml  Output 20 ml  Net 1010.11 ml    Additional Objective Labs: Basic Metabolic Panel: Recent Labs  Lab 07/04/19 1238 07/06/19 1122 07/08/19 1120 07/09/19 0559  NA 134* 134*  --  136  K 4.4 3.5  --  3.0*  CL 94* 95*  --  96*  CO2 22 24  --  27  GLUCOSE 120* 115*  --  93  BUN 63* 25*  --  12  CREATININE 14.61* 8.16*  --  4.95*  CALCIUM 9.1 8.6*  --  8.1*  PHOS  --   --  4.9*  --    Liver Function Tests: Recent Labs  Lab 07/04/19 1238 07/06/19 1122  AST 53* 95*  ALT 18 35  ALKPHOS 83 69  BILITOT 0.8 0.7  PROT 7.1 6.1*  ALBUMIN 3.0* 2.3*   Recent Labs  Lab 07/04/19 1238  LIPASE 50   CBC: Recent Labs  Lab 07/04/19 1238 07/06/19 1122 07/09/19 0559  WBC 17.7* 11.5* 11.8*  NEUTROABS  --  9.3*  --   HGB 12.0 10.1* 9.9*  HCT 38.4 31.5* 30.3*  MCV 98.0 95.7 94.1  PLT 162 150 230   Blood Culture    Component Value Date/Time   SDES ABSCESS 07/09/2019 1415   SPECREQUEST RIGHT BUTTOCK ABSCESS SPEC A 07/09/2019 1415   CULT RARE STAPHYLOCOCCUS AUREUS  07/09/2019 1415   REPTSTATUS PENDING 07/09/2019 1415   CBG: Recent Labs  Lab 07/09/19 1123 07/09/19 1641 07/09/19 2239 07/10/19 0641 07/10/19 1111  GLUCAP 105* 123* 270* 172* 248*   Iron Studies:  Recent Labs    07/09/19 0559  IRON 61  TIBC 158*   Lab Results  Component Value Date   INR 1.20 12/15/2017   Studies/Results: No results found.  Medications: . vancomycin 750 mg (07/05/19 1601)   . acidophilus  1 capsule Oral Daily  . amLODipine  10 mg Oral Daily  . aspirin EC  81 mg Oral Daily  . atorvastatin  40 mg Oral Daily  . Chlorhexidine Gluconate Cloth  6 each Topical Q0600  . clopidogrel  75 mg Oral Daily  . darbepoetin (ARANESP) injection - DIALYSIS  60 mcg Intravenous Q Thu-HD  . feeding supplement (NEPRO CARB STEADY)  237 mL Oral BID BM  . feeding supplement (PRO-STAT SUGAR FREE 64)  30 mL Oral BID  . ferric citrate  420 mg Oral TID WC  . heparin  5,000 Units Subcutaneous Q8H  .  insulin aspart  0-6 Units Subcutaneous TID WC  . insulin aspart protamine- aspart  10 Units Subcutaneous BID WC  . isosorbide mononitrate  30 mg Oral Daily  . metoprolol tartrate  25 mg Oral BID  . multivitamin  1 tablet Oral QHS  . nystatin  1 application Topical BID  . pantoprazole  40 mg Oral Daily  . predniSONE  5 mg Oral Q breakfast    Dialysis Orders: [facility] 3.25hrs, BFR400, [id number], EDW 69.5kg,2K/2.25Ca  Access:LU AVG Heparinnone Mircera44mcg q4wks - last4/27 Hectorol53mcg IV qHD   Assessment/Plan: 1. Perirectal/R gluteal abscess- thought to be 2/2 cellulitis/abscess: CT showed likely cellulitis of R buttock and possible pyelonephritis vs infected cyst. Renal US5/9-->no hydro, some benign-appearing cysts. BC NGTD. On vanc/ meropenem (07/05/19-)Afebrile. I&D on 5/12 w/significnat drainage, large abscess w/ perirectal extension. Penrose in place. WC +cocci. ABX x14d. OP f/u w/surgery, Per surgery 2. Abnormal ovarian lesion- incidental findings on  CT. will need additional workup. Per primary 3. ESRD- On HD TTS. Last K3.0.HD today per regular schedule using 4K bath.  4. Hypertension/volume- BP well controlled. Does not appear volume overloaded. Titrate down as tolerated. 5. Anemiaof CKD- last Hgb 9.9.ESA not given yesterday, will write to give w/HD tomorrow.  6. Secondary Hyperparathyroidism -Ca and phos at goal. Continue VDRA and binders.  7. Nutrition- Renal diet w/fluid restrictions.  8. Lupus- on chronic prednisone 9. CAD s/p stenting- per primary 10. DMT2- on insulin, per primary  Jen Mow, PA-C [facility] Pager: [phone] 07/10/2019,11:56 AM  LOS: 5 days

## 2019-07-11 LAB — CULTURE, BLOOD (ROUTINE X 2)
Culture: NO GROWTH
Culture: NO GROWTH
Special Requests: ADEQUATE
Special Requests: ADEQUATE

## 2019-07-11 LAB — BASIC METABOLIC PANEL
Anion gap: 13 (ref 5–15)
BUN: 51 mg/dL — ABNORMAL HIGH (ref 6–20)
CO2: 24 mmol/L (ref 22–32)
Calcium: 8.5 mg/dL — ABNORMAL LOW (ref 8.9–10.3)
Chloride: 98 mmol/L (ref 98–111)
Creatinine, Ser: 10.54 mg/dL — ABNORMAL HIGH (ref 0.44–1.00)
GFR calc Af Amer: 5 mL/min — ABNORMAL LOW (ref >60)
GFR calc non Af Amer: 4 mL/min — ABNORMAL LOW (ref >60)
Glucose, Bld: 96 mg/dL (ref 70–99)
Potassium: 3.7 mmol/L (ref 3.5–5.1)
Sodium: 135 mmol/L (ref 135–145)

## 2019-07-11 LAB — CBC WITH DIFFERENTIAL/PLATELET
Abs Immature Granulocytes: 0.37 10*3/uL — ABNORMAL HIGH (ref 0.00–0.07)
Basophils Absolute: 0 10*3/uL (ref 0.0–0.1)
Basophils Relative: 0 %
Eosinophils Absolute: 0.1 10*3/uL (ref 0.0–0.5)
Eosinophils Relative: 1 %
HCT: 24.7 % — ABNORMAL LOW (ref 36.0–46.0)
Hemoglobin: 7.9 g/dL — ABNORMAL LOW (ref 12.0–15.0)
Immature Granulocytes: 4 %
Lymphocytes Relative: 20 %
Lymphs Abs: 2.1 10*3/uL (ref 0.7–4.0)
MCH: 30.7 pg (ref 26.0–34.0)
MCHC: 32 g/dL (ref 30.0–36.0)
MCV: 96.1 fL (ref 80.0–100.0)
Monocytes Absolute: 1 10*3/uL (ref 0.1–1.0)
Monocytes Relative: 9 %
Neutro Abs: 6.9 10*3/uL (ref 1.7–7.7)
Neutrophils Relative %: 66 %
Platelets: 265 10*3/uL (ref 150–400)
RBC: 2.57 MIL/uL — ABNORMAL LOW (ref 3.87–5.11)
RDW: 17.6 % — ABNORMAL HIGH (ref 11.5–15.5)
WBC: 10.4 10*3/uL (ref 4.0–10.5)
nRBC: 0 % (ref 0.0–0.2)

## 2019-07-11 LAB — GLUCOSE, CAPILLARY
Glucose-Capillary: 81 mg/dL (ref 70–99)
Glucose-Capillary: 132 mg/dL — ABNORMAL HIGH (ref 70–99)

## 2019-07-11 MED ORDER — VANCOMYCIN HCL IN DEXTROSE 750-5 MG/150ML-% IV SOLN: 750.0000 mg | Freq: Once | INTRAVENOUS | Status: DC

## 2019-07-11 MED ORDER — DARBEPOETIN ALFA 60 MCG/0.3ML IJ SOSY: 60.0000 ug | PREFILLED_SYRINGE | Freq: Once | INTRAMUSCULAR | Status: AC

## 2019-07-11 MED ORDER — DARBEPOETIN ALFA 60 MCG/0.3ML IJ SOSY
PREFILLED_SYRINGE | INTRAMUSCULAR | Status: AC
  Administered 2019-07-11: 60 ug via INTRAVENOUS
  Filled 2019-07-11: qty 0.3

## 2019-07-11 MED ORDER — CEPHALEXIN 500 MG PO CAPS: 500.0000 mg | ORAL_CAPSULE | Freq: Every day | ORAL | 0 refills | Status: AC

## 2019-07-11 NOTE — TOC Transition Note (Signed)
Transition of Care [facility]) - CM/SW Discharge Note   Patient Details  Name: Paula Bass MRN: [id number] Date of Birth: 06-17-1970  Transition of Care [facility]) CM/SW Contact:  Rae Mar, RN Phone Number: 07/11/2019, 3:08 PM   Clinical Narrative:     CM consulted for [facility] RN wound care needs.  CM spoke with pt at bedside and she chose [facility].  Pt reported no further needs that CM could assist with at this time.  CM contacted Brittney with [facility] who accepted pt for services.  Information placed on AVS.    No further CM needs noted at this time.  Final next level of care: [place]     Patient Goals and CMS Choice   CMS Medicare.gov Compare Post Acute Care list provided to:: Patient Choice offered to / list presented to : Patient  Discharge Placement                       Discharge Plan and Services                          HH Arranged: RN [facility] Agency: [facility] Date Central: 07/11/19 Time [place]: [id number] Representative spoke with at [place]: [place] ([place]) Interventions     Readmission Risk Interventions No flowsheet data found.

## 2019-07-11 NOTE — Discharge Summary (Signed)
Physician Discharge Summary  Roslin Norwood Dowdy IOE:[id number] DOB: [birth year] DOA: 07/04/2019  PCP: Benito Mccreedy, MD  Admit date: 07/04/2019 Discharge date: 07/11/2019  Admitted From: Home Disposition: Home  Recommendations for Outpatient Follow-up:  1. Follow up with surgery in 2 weeks as a scheduled. 2. Go to your hemodialysis center tomorrow as scheduled.  Home Health: RN Equipment/Devices: None needed  Discharge Condition: Stable CODE STATUS: Full code Diet recommendation: Low-salt, low carb diet  Discharge summary: 49 year old female ESRD on hemodialysis TTS schedule, hypertension, insulin-dependent diabetes, lupus on chronic steroids, coronary artery disease on aspirin and Plavix presented to the ER with fever and chills for 3 days and found to have right buttock infection.  She was treated with broad-spectrum antibiotic, also with some CT scan evidence of pyelonephritis.  Ultimately she was found to have localized abscess on her right buttock, underwent incision drainage on 5/12 by surgery.  Pus cultures with MSSA.  Blood cultures negative.  Clinical improvement.  With clinical improvement, patient is going home today.  As per surgical recommendations, treating with 2 weeks of antibiotics, will prescribe Keflex 500 mg daily dosed for ESRD for 12 more days.  She has mentioned allergy to penicillin, however she has taken meropenem and ceftriaxone in the hospital without any adverse reaction. Patient has Penrose drain sutured in her buttock wound, as per surgery plan, she will have daily dry dressing, surgery to follow-up in 2 weeks. She will do dressing herself at home, will also benefit with home health RN to check on wound, teach dressing techniques and monitor progress.  She has multiple other medical issues and they are all stable.  She received dialysis today.  She will go to her scheduled hemodialysis session tomorrow 5/15.  Patient does have history of anemia of chronic  disease mostly related to her dialysis.  No evidence of active bleeding.  Hemoglobin is slightly dropped today to 7.9.  Nephrology aware and she is on [place].  Discharge Diagnoses:  Principal Problem:   Cellulitis of buttock Active Problems:   ESRD (end stage renal disease) (HCC)   DM2 (diabetes mellitus, type 2) (HCC)   Lupus (HCC)   CAD (coronary artery disease)   Essential hypertension    Discharge Instructions  Discharge Instructions    Call MD for:  severe uncontrolled pain   Complete by: As directed    Call MD for:  temperature >100.4   Complete by: As directed    Change dressing (specify)   Complete by: As directed    Dressing change: Dry dressing, once a day using clean/sterile gauze. He can shower, clean the wound and change the dressing.   Diet - low sodium heart healthy   Complete by: As directed    Increase activity slowly   Complete by: As directed      Allergies as of 07/11/2019      Reactions   Penicillins Hives   DID THE REACTION INVOLVE: Swelling of the face/tongue/throat, SOB, or low BP? Yes Sudden or severe rash/hives, skin peeling, or the inside of the mouth or nose? Yes Did it require medical treatment? No When did it last happen?Within the past 10 years If all above answers are "NO", may proceed with cephalosporin use. Ceftriaxone/merrem ok      Medication List    TAKE these medications   acetaminophen 325 MG tablet Commonly known as: TYLENOL Take 2 tablets (650 mg total) by mouth every 4 (four) hours as needed for fever, headache or mild pain.  Acidophilus Caps capsule Take 1 capsule by mouth daily.   amLODipine 10 MG tablet Commonly known as: NORVASC Take 10 mg by mouth daily.   ammonium lactate 12 % lotion Commonly known as: AmLactin Apply 1 application topically as needed for dry skin.   aspirin EC 81 MG tablet Take 1 tablet (81 mg total) by mouth daily.   atorvastatin 40 MG tablet Commonly known as: LIPITOR Take 40 mg by  mouth daily.   Auryxia 1 GM 210 MG(Fe) tablet Generic drug: ferric citrate Take 1-2 tablets by mouth See admin instructions. Take 2 tablets three times a day with meals and 1 tablet twice a day with a snack   cephALEXin 500 MG capsule Commonly known as: KEFLEX Take 1 capsule (500 mg total) by mouth daily for 12 days.   clopidogrel 75 MG tablet Commonly known as: PLAVIX Take 1 tablet (75 mg total) by mouth daily.   CYANOCOBALAMIN PO Take 2 tablets by mouth daily.   insulin NPH-regular Human (70-30) 100 UNIT/ML injection Inject 30-40 Units into the skin 2 (two) times daily. Inject 40 units in the morning and 30 units at bedtime.   isosorbide mononitrate 30 MG 24 hr tablet Commonly known as: IMDUR Take 1 tablet (30 mg total) by mouth daily.   lidocaine-prilocaine cream Commonly known as: EMLA Apply 1 application topically Every Tuesday,Thursday,and Saturday with dialysis.   Lubricant Eye Drops 0.4-0.3 % Soln Generic drug: Polyethyl Glycol-Propyl Glycol Place 1 drop into both eyes 3 (three) times daily as needed (dry/irritated eyes.).   meclizine 12.5 MG tablet Commonly known as: ANTIVERT Take 12.5 mg by mouth 2 (two) times daily as needed for dizziness.   metoprolol tartrate 25 MG tablet Commonly known as: LOPRESSOR Take 1 tablet (25 mg total) by mouth 2 (two) times daily.   multivitamin Tabs tablet Take 1 tablet by mouth daily.   nitroGLYCERIN 0.4 MG SL tablet Commonly known as: Nitrostat Place 1 tablet (0.4 mg total) under the tongue every 5 (five) minutes as needed.   nystatin powder Generic drug: nystatin Apply 1 application topically 3 (three) times daily as needed for rash.   OVER THE COUNTER MEDICATION Take 3 tablets by mouth daily. Tumeric and Ginger gummies   oxyCODONE 5 MG immediate release tablet Commonly known as: Oxy IR/ROXICODONE Take 1 tablet (5 mg total) by mouth every 4 (four) hours as needed for moderate pain or severe pain (5mg  for moderate  pain, 10mg  for severe pain).   pantoprazole 40 MG tablet Commonly known as: PROTONIX Take 1 tablet (40 mg total) by mouth daily.   predniSONE 5 MG tablet Commonly known as: DELTASONE Take 5-10 mg by mouth daily as needed for pain.            Discharge Care Instructions  (From admission, onward)         Start     Ordered   07/11/19 0000  Change dressing (specify)    Comments: Dressing change: Dry dressing, once a day using clean/sterile gauze. He can shower, clean the wound and change the dressing.   07/11/19 1404         Follow-up Information    Kinsinger, Arta Bruce, MD Follow up on 07/23/2019.   Specialty: General Surgery Why: Arrive at 1:00 for a 1:30 appointment.  Contact information: [address]  [place] [address] [phone]          Allergies  Allergen Reactions  . Penicillins Hives    DID THE REACTION  INVOLVE: Swelling of the face/tongue/throat, SOB, or low BP? Yes Sudden or severe rash/hives, skin peeling, or the inside of the mouth or nose? Yes Did it require medical treatment? No When did it last happen?Within the past 10 years If all above answers are "NO", may proceed with cephalosporin use. Ceftriaxone/merrem ok    Consultations:  Nephrology, dialysis needed  General surgery, incision drainage   Procedures/Studies: CT ABDOMEN PELVIS W CONTRAST  Result Date: 07/04/2019 CLINICAL DATA:  49 year old female with abdominal pain, fever and leukocytosis. EXAM: CT ABDOMEN AND PELVIS WITH CONTRAST TECHNIQUE: Multidetector CT imaging of the abdomen and pelvis was performed using the standard protocol following bolus administration of intravenous contrast. CONTRAST:  14mL OMNIPAQUE IOHEXOL 300 MG/ML  SOLN COMPARISON:  CT abdomen pelvis dated 04/20/2018. FINDINGS: Lower chest: The visualized lung bases are clear. Multi vessel coronary vascular calcifications noted. Intra-abdominal free air or free fluid. Hepatobiliary: The liver is  unremarkable. No intrahepatic biliary ductal dilatation. The gallbladder is unremarkable. Pancreas: Unremarkable. No pancreatic ductal dilatation or surrounding inflammatory changes. Spleen: Normal in size without focal abnormality. Adrenals/Urinary Tract: The adrenal glands are unremarkable. There are bilateral renal cystic lesions as well as additional subcentimeter hypodense lesions which are too small to characterize. There is a 3 cm low attenuating lesion in the inferior pole of the right kidney similar to prior CT. There is slight higher attenuation of this cystic lesion likely represents a complex cyst with internal debris. There is stranding of the right inferior perinephric fat concerning for edema possibly related to cyst infection. This has slightly increased since the prior CT. Clinical correlation and further evaluation with renal ultrasound recommended. Several punctate calcific foci along the periphery of the right inferior pole cyst may represent areas of wall calcification. A 2 cm cyst in the interpolar aspect of the left kidney has a partially calcified wall appears similar to prior CT. There is no hydronephrosis on either side. Punctate nonobstructing bilateral renal calculi versus renal vascular calcification. The visualized ureters and urinary bladder appear unremarkable. Stomach/Bowel: There is sigmoid diverticulosis without active inflammatory changes. There is no bowel obstruction. The appendix is normal. Vascular/Lymphatic: Moderate aortoiliac atherosclerotic disease. The IVC is unremarkable. No portal venous gas. There is no adenopathy. Reproductive: The uterus is anteverted. Multiple uterine fibroids noted. There is a 2 cm slightly higher attenuating lesion in the right ovary which is not well characterized. Pelvic ultrasound is recommended for better evaluation on a nonemergent/outpatient basis. The left ovary is unremarkable. Other: There is stranding and edema of the subcutaneous soft  tissues of the right gluteal region and right buttock. No fluid collection. Musculoskeletal: No acute or significant osseous findings. IMPRESSION: 1. Stranding of the right inferior perinephric fat concerning for edema possibly related to cyst infection or pyelonephritis. Clinical correlation and further evaluation with renal ultrasound recommended. 2. Sigmoid diverticulosis. No bowel obstruction. Normal appendix. 3. A 2 cm slightly higher attenuating lesion in the right ovary is not well characterized. Pelvic ultrasound is recommended for better evaluation on a nonemergent/outpatient basis. 4. Right gluteal subcutaneous edema and stranding. No fluid collection. 5. Aortic Atherosclerosis (ICD10-I70.0). Electronically Signed   By: Anner Crete M.D.   On: 07/04/2019 19:53   US RENAL  Result Date: 07/06/2019 CLINICAL DATA:  Right pyelonephritis. EXAM: RENAL / URINARY TRACT ULTRASOUND COMPLETE COMPARISON:  None. FINDINGS: Right Kidney: Renal measurements: 10.1 x 4.8 x 5.5 cm = volume: 139 mL. Diffusely increased parenchymal echogenicity. Several small benign-appearing renal cysts are noted, largest measuring approximately 2.6  cm. No soft mass or hydronephrosis visualized. Left Kidney: Renal measurements: 6.7 x 4.9 x 4.4 cm = volume: 76 mL. Diffusely increased parenchymal echogenicity. A small benign-appearing cyst is seen which measures 2.4 cm. No mass or hydronephrosis visualized. Bladder: Bladder is nearly completely empty and not well visualized. Other: None. IMPRESSION: Diffusely increased renal parenchymal echogenicity, consistent with medical renal disease. No evidence of hydronephrosis. Small bilateral renal cysts.  No masses identified. Electronically Signed   By: Marlaine Hind M.D.   On: 07/06/2019 16:52   DG CHEST PORT 1 VIEW  Result Date: 07/06/2019 CLINICAL DATA:  Fever. EXAM: PORTABLE CHEST 1 VIEW COMPARISON:  February 12, 2018 FINDINGS: The heart size is mildly enlarged. Aortic calcifications are  noted. Both lungs are clear. The visualized skeletal structures are unremarkable. There are bilateral old healed rib fractures. IMPRESSION: No active disease. Electronically Signed   By: Constance Holster M.D.   On: 07/06/2019 18:19     Subjective: Patient seen and examined.  Dressing changed by nursing staff at the bedside.  Has some discomfort with suture and drain placed on her buttock, however denies any other complaints.  Afebrile. Was not sure about dressing changes at home, instructions provided by nursing staff at the bedside.   Discharge Exam: Vitals:   07/11/19 0436 07/11/19 1205  BP: 121/64 124/69  Pulse: 73 75  Resp: 12 16  Temp: 98.1 F (36.7 C) 97.8 F (36.6 C)  SpO2: 99% 100%   Vitals:   07/10/19 2115 07/10/19 2131 07/11/19 0436 07/11/19 1205  BP: 133/68  121/64 124/69  Pulse: 76  73 75  Resp: 18  12 16   Temp: 98.2 F (36.8 C)  98.1 F (36.7 C) 97.8 F (36.6 C)  TempSrc:   Oral Oral  SpO2: 96%  99% 100%  Weight:  70.8 kg    Height:        General: Pt is alert, awake, not in acute distress Cardiovascular: RRR, S1/S2 +, no rubs, no gallops Respiratory: CTA bilaterally, no wheezing, no rhonchi Abdominal: Soft, NT, ND, bowel sounds + Extremities: no edema, no cyanosis Right buttock, induration present, redness and erythema subsiding, Penrose drain with stitches present, no pus draining.    The results of significant diagnostics from this hospitalization (including imaging, microbiology, ancillary and laboratory) are listed below for reference.     Microbiology: Recent Results (from the past 240 hour(s))  Respiratory Panel by RT PCR (Flu A&B, Covid) - Nasopharyngeal Swab     Status: None   Collection Time: 07/04/19 11:43 PM   Specimen: Nasopharyngeal Swab  Result Value Ref Range Status   SARS Coronavirus 2 by RT PCR NEGATIVE NEGATIVE Final    Comment: (NOTE) SARS-CoV-2 target nucleic acids are NOT DETECTED. The SARS-CoV-2 RNA is generally detectable  in upper respiratoy specimens during the acute phase of infection. The lowest concentration of SARS-CoV-2 viral copies this assay can detect is 131 copies/mL. A negative result does not preclude SARS-Cov-2 infection and should not be used as the sole basis for treatment or other patient management decisions. A negative result may occur with  improper specimen collection/handling, submission of specimen other than nasopharyngeal swab, presence of viral mutation(s) within the areas targeted by this assay, and inadequate number of viral copies (<131 copies/mL). A negative result must be combined with clinical observations, patient history, and epidemiological information. The expected result is Negative. Fact Sheet for Patients:  PinkCheek.be Fact Sheet for Healthcare Providers:  GravelBags.it This test is not yet ap proved  or cleared by the [place] and  has been authorized for detection and/or diagnosis of SARS-CoV-2 by FDA under an Emergency Use Authorization (EUA). This EUA will remain  in effect (meaning this test can be used) for the duration of the COVID-19 declaration under Section 564(b)(1) of the Act, 21 U.S.C. section 360bbb-3(b)(1), unless the authorization is terminated or revoked sooner.    Influenza A by PCR NEGATIVE NEGATIVE Final   Influenza B by PCR NEGATIVE NEGATIVE Final    Comment: (NOTE) The Xpert Xpress SARS-CoV-2/FLU/RSV assay is intended as an aid in  the diagnosis of influenza from Nasopharyngeal swab specimens and  should not be used as a sole basis for treatment. Nasal washings and  aspirates are unacceptable for Xpert Xpress SARS-CoV-2/FLU/RSV  testing. Fact Sheet for Patients: PinkCheek.be Fact Sheet for Healthcare Providers: GravelBags.it This test is not yet approved or cleared by the [place] FDA and  has been authorized for  detection and/or diagnosis of SARS-CoV-2 by  FDA under an Emergency Use Authorization (EUA). This EUA will remain  in effect (meaning this test can be used) for the duration of the  Covid-19 declaration under Section 564(b)(1) of the Act, 21  U.S.C. section 360bbb-3(b)(1), unless the authorization is  terminated or revoked. Performed at [facility], [place] [address], [place], [place] [address]   MRSA PCR Screening     Status: Abnormal   Collection Time: 07/05/19 12:45 AM   Specimen: Nasal Mucosa; Nasopharyngeal  Result Value Ref Range Status   MRSA by PCR POSITIVE (A) NEGATIVE Final    Comment:        The GeneXpert MRSA Assay (FDA approved for NASAL specimens only), is one component of a comprehensive MRSA colonization surveillance program. It is not intended to diagnose MRSA infection nor to guide or monitor treatment for MRSA infections. RESULT CALLED TO, READ BACK BY AND VERIFIED WITHLendell Caprice RN 07/05/19 0347 JDW Performed at [facility] [address] [place], [place] [address]   Culture, blood (single) w Reflex to ID Panel     Status: None   Collection Time: 07/05/19 12:59 AM   Specimen: BLOOD  Result Value Ref Range Status   Specimen Description BLOOD SITE NOT SPECIFIED  Final   Special Requests   Final    BOTTLES DRAWN AEROBIC ONLY Blood Culture adequate volume   Culture   Final    NO GROWTH 5 DAYS Performed at [facility], [address] [place], [place] [address]    Report Status 07/10/2019 FINAL  Final  Culture, blood (Routine X 2) w Reflex to ID Panel     Status: None (Preliminary result)   Collection Time: 07/06/19 11:29 AM   Specimen: BLOOD  Result Value Ref Range Status   Specimen Description BLOOD RIGHT ANTECUBITAL  Final   Special Requests   Final    BOTTLES DRAWN AEROBIC AND ANAEROBIC Blood Culture adequate volume   Culture   Final    NO GROWTH 4 DAYS Performed at [facility], [place] [address], [place], [place]  [address]    Report Status PENDING  Incomplete  Culture, blood (Routine X 2) w Reflex to ID Panel     Status: None (Preliminary result)   Collection Time: 07/06/19 11:34 AM   Specimen: BLOOD RIGHT HAND  Result Value Ref Range Status   Specimen Description BLOOD RIGHT HAND  Final   Special Requests   Final    BOTTLES DRAWN  AEROBIC AND ANAEROBIC Blood Culture adequate volume   Culture   Final    NO GROWTH 4 DAYS Performed at [facility] [address], [place], [place] [address]    Report Status PENDING  Incomplete  Surgical pcr screen     Status: Abnormal   Collection Time: 07/08/19  9:13 PM   Specimen: Nasal Mucosa; Nasal Swab  Result Value Ref Range Status   MRSA, PCR NEGATIVE NEGATIVE Final   Staphylococcus aureus POSITIVE (A) NEGATIVE Final    Comment: (NOTE) The Xpert SA Assay (FDA approved for NASAL specimens in patients 44 years of age and older), is one component of a comprehensive surveillance program. It is not intended to diagnose infection nor to guide or monitor treatment. Performed at [facility], [place] [address], [place], [place] [address]   Aerobic/Anaerobic Culture (surgical/deep wound)     Status: None (Preliminary result)   Collection Time: 07/09/19  2:15 PM   Specimen: PATH Other; Body Fluid  Result Value Ref Range Status   Specimen Description ABSCESS  Final   Special Requests RIGHT BUTTOCK ABSCESS SPEC A  Final   Gram Stain   Final    ABUNDANT WBC PRESENT, PREDOMINANTLY PMN MODERATE GRAM POSITIVE COCCI IN PAIRS Performed at [facility], [address] [place], [place] [address]    Culture   Final    FEW STAPHYLOCOCCUS AUREUS NO ANAEROBES ISOLATED; CULTURE IN PROGRESS FOR 5 DAYS    Report Status PENDING  Incomplete   Organism ID, Bacteria STAPHYLOCOCCUS AUREUS  Final      Susceptibility   Staphylococcus aureus - MIC*    CIPROFLOXACIN >=8 RESISTANT Resistant     ERYTHROMYCIN <=0.25 SENSITIVE Sensitive     GENTAMICIN <=0.5  SENSITIVE Sensitive     OXACILLIN <=0.25 SENSITIVE Sensitive     TETRACYCLINE <=1 SENSITIVE Sensitive     VANCOMYCIN 1 SENSITIVE Sensitive     TRIMETH/SULFA <=10 SENSITIVE Sensitive     CLINDAMYCIN <=0.25 SENSITIVE Sensitive     RIFAMPIN <=0.5 SENSITIVE Sensitive     Inducible Clindamycin NEGATIVE Sensitive     * FEW STAPHYLOCOCCUS AUREUS     Labs: BNP (last 3 results) No results for input(s): BNP in the last 8760 hours. Basic Metabolic Panel: Recent Labs  Lab 07/06/19 1122 07/08/19 1120 07/09/19 0559 07/11/19 0548  NA 134*  --  136 135  K 3.5  --  3.0* 3.7  CL 95*  --  96* 98  CO2 24  --  27 24  GLUCOSE 115*  --  93 96  BUN 25*  --  12 51*  CREATININE 8.16*  --  4.95* 10.54*  CALCIUM 8.6*  --  8.1* 8.5*  PHOS  --  4.9*  --   --    Liver Function Tests: Recent Labs  Lab 07/06/19 1122  AST 95*  ALT 35  ALKPHOS 69  BILITOT 0.7  PROT 6.1*  ALBUMIN 2.3*   No results for input(s): LIPASE, AMYLASE in the last 168 hours. No results for input(s): AMMONIA in the last 168 hours. CBC: Recent Labs  Lab 07/06/19 1122 07/09/19 0559 07/11/19 0548  WBC 11.5* 11.8* 10.4  NEUTROABS 9.3*  --  6.9  HGB 10.1* 9.9* 7.9*  HCT 31.5* 30.3* 24.7*  MCV 95.7 94.1 96.1  PLT 150 230 265   Cardiac Enzymes: No results for input(s): CKTOTAL, CKMB, CKMBINDEX, TROPONINI in the last 168 hours. BNP: Invalid input(s): POCBNP CBG: Recent Labs  Lab  07/10/19 1111 07/10/19 1630 07/10/19 2130 07/11/19 0639 07/11/19 1205  GLUCAP 248* 230* 144* 81 132*   D-Dimer No results for input(s): DDIMER in the last 72 hours. Hgb A1c Recent Labs    07/08/19 1447  HGBA1C 7.7*   Lipid Profile No results for input(s): CHOL, HDL, LDLCALC, TRIG, CHOLHDL, LDLDIRECT in the last 72 hours. Thyroid function studies No results for input(s): TSH, T4TOTAL, T3FREE, THYROIDAB in the last 72 hours.  Invalid input(s): FREET3 Anemia work up Recent Labs    07/09/19 0559  TIBC 158*  IRON 61    Urinalysis    Component Value Date/Time   COLORURINE YELLOW 12/01/2017 2017   APPEARANCEUR HAZY (A) 12/01/2017 2017   LABSPEC 1.009 12/01/2017 2017   PHURINE 9.0 (H) 12/01/2017 2017   GLUCOSEU 50 (A) 12/01/2017 2017   HGBUR NEGATIVE 12/01/2017 2017   BILIRUBINUR NEGATIVE 12/01/2017 2017   KETONESUR NEGATIVE 12/01/2017 2017   PROTEINUR >=300 (A) 12/01/2017 2017   NITRITE NEGATIVE 12/01/2017 2017   LEUKOCYTESUR LARGE (A) 12/01/2017 2017   Sepsis Labs Invalid input(s): PROCALCITONIN,  WBC,  LACTICIDVEN Microbiology Recent Results (from the past 240 hour(s))  Respiratory Panel by RT PCR (Flu A&B, Covid) - Nasopharyngeal Swab     Status: None   Collection Time: 07/04/19 11:43 PM   Specimen: Nasopharyngeal Swab  Result Value Ref Range Status   SARS Coronavirus 2 by RT PCR NEGATIVE NEGATIVE Final    Comment: (NOTE) SARS-CoV-2 target nucleic acids are NOT DETECTED. The SARS-CoV-2 RNA is generally detectable in upper respiratoy specimens during the acute phase of infection. The lowest concentration of SARS-CoV-2 viral copies this assay can detect is 131 copies/mL. A negative result does not preclude SARS-Cov-2 infection and should not be used as the sole basis for treatment or other patient management decisions. A negative result may occur with  improper specimen collection/handling, submission of specimen other than nasopharyngeal swab, presence of viral mutation(s) within the areas targeted by this assay, and inadequate number of viral copies (<131 copies/mL). A negative result must be combined with clinical observations, patient history, and epidemiological information. The expected result is Negative. Fact Sheet for Patients:  PinkCheek.be Fact Sheet for Healthcare Providers:  GravelBags.it This test is not yet ap proved or cleared by the [place] FDA and  has been authorized for detection and/or diagnosis of  SARS-CoV-2 by FDA under an Emergency Use Authorization (EUA). This EUA will remain  in effect (meaning this test can be used) for the duration of the COVID-19 declaration under Section 564(b)(1) of the Act, 21 U.S.C. section 360bbb-3(b)(1), unless the authorization is terminated or revoked sooner.    Influenza A by PCR NEGATIVE NEGATIVE Final   Influenza B by PCR NEGATIVE NEGATIVE Final    Comment: (NOTE) The Xpert Xpress SARS-CoV-2/FLU/RSV assay is intended as an aid in  the diagnosis of influenza from Nasopharyngeal swab specimens and  should not be used as a sole basis for treatment. Nasal washings and  aspirates are unacceptable for Xpert Xpress SARS-CoV-2/FLU/RSV  testing. Fact Sheet for Patients: PinkCheek.be Fact Sheet for Healthcare Providers: GravelBags.it This test is not yet approved or cleared by the [place] FDA and  has been authorized for detection and/or diagnosis of SARS-CoV-2 by  FDA under an Emergency Use Authorization (EUA). This EUA will remain  in effect (meaning this test can be used) for the duration of the  Covid-19 declaration under Section 564(b)(1) of the Act, 21  U.S.C. section 360bbb-3(b)(1), unless the authorization is  terminated or revoked. Performed at [facility], [place] [address] [address], [place], [place] [address]   MRSA PCR Screening     Status: Abnormal   Collection Time: 07/05/19 12:45 AM   Specimen: Nasal Mucosa; Nasopharyngeal  Result Value Ref Range Status   MRSA by PCR POSITIVE (A) NEGATIVE Final    Comment:        The GeneXpert MRSA Assay (FDA approved for NASAL specimens only), is one component of a comprehensive MRSA colonization surveillance program. It is not intended to diagnose MRSA infection nor to guide or monitor treatment for MRSA infections. RESULT CALLED TO, READ BACK BY AND VERIFIED WITHLendell Caprice RN 07/05/19 0347 JDW Performed at [facility] [address], [place], [place] [address]   Culture, blood (single) w Reflex to ID Panel     Status: None   Collection Time: 07/05/19 12:59 AM   Specimen: BLOOD  Result Value Ref Range Status   Specimen Description BLOOD SITE NOT SPECIFIED  Final   Special Requests   Final    BOTTLES DRAWN AEROBIC ONLY Blood Culture adequate volume   Culture   Final    NO GROWTH 5 DAYS Performed at [facility], [address], [place], [place] [address]    Report Status 07/10/2019 FINAL  Final  Culture, blood (Routine X 2) w Reflex to ID Panel     Status: None (Preliminary result)   Collection Time: 07/06/19 11:29 AM   Specimen: BLOOD  Result Value Ref Range Status   Specimen Description BLOOD RIGHT ANTECUBITAL  Final   Special Requests   Final    BOTTLES DRAWN AEROBIC AND ANAEROBIC Blood Culture adequate volume   Culture   Final    NO GROWTH 4 DAYS Performed at [facility], [place] [address] [place], [place] [address]    Report Status PENDING  Incomplete  Culture, blood (Routine X 2) w Reflex to ID Panel     Status: None (Preliminary result)   Collection Time: 07/06/19 11:34 AM   Specimen: BLOOD RIGHT HAND  Result Value Ref Range Status   Specimen Description BLOOD RIGHT HAND  Final   Special Requests   Final    BOTTLES DRAWN AEROBIC AND ANAEROBIC Blood Culture adequate volume   Culture   Final    NO GROWTH 4 DAYS Performed at [facility], [place] [address], [place], [place] [address]    Report Status PENDING  Incomplete  Surgical pcr screen     Status: Abnormal   Collection Time: 07/08/19  9:13 PM   Specimen: Nasal Mucosa; Nasal Swab  Result Value Ref Range Status   MRSA, PCR NEGATIVE NEGATIVE Final   Staphylococcus aureus POSITIVE (A) NEGATIVE Final    Comment: (NOTE) The Xpert SA Assay (FDA approved for NASAL specimens in patients 44 years of age and older), is one component of a comprehensive surveillance program. It is not intended to diagnose infection nor to guide  or monitor treatment. Performed at [facility], [place] [address] [place], [place] [address]   Aerobic/Anaerobic Culture (surgical/deep wound)     Status: None (Preliminary result)   Collection Time: 07/09/19  2:15 PM   Specimen: PATH Other; Body Fluid  Result Value Ref Range Status   Specimen Description ABSCESS  Final   Special Requests RIGHT BUTTOCK ABSCESS SPEC A  Final   Gram Stain   Final    ABUNDANT WBC PRESENT, PREDOMINANTLY PMN MODERATE GRAM POSITIVE  COCCI IN PAIRS Performed at [facility], [place] [address] [place], [place] [address]    Culture   Final    FEW STAPHYLOCOCCUS AUREUS NO ANAEROBES ISOLATED; CULTURE IN PROGRESS FOR 5 DAYS    Report Status PENDING  Incomplete   Organism ID, Bacteria STAPHYLOCOCCUS AUREUS  Final      Susceptibility   Staphylococcus aureus - MIC*    CIPROFLOXACIN >=8 RESISTANT Resistant     ERYTHROMYCIN <=0.25 SENSITIVE Sensitive     GENTAMICIN <=0.5 SENSITIVE Sensitive     OXACILLIN <=0.25 SENSITIVE Sensitive     TETRACYCLINE <=1 SENSITIVE Sensitive     VANCOMYCIN 1 SENSITIVE Sensitive     TRIMETH/SULFA <=10 SENSITIVE Sensitive     CLINDAMYCIN <=0.25 SENSITIVE Sensitive     RIFAMPIN <=0.5 SENSITIVE Sensitive     Inducible Clindamycin NEGATIVE Sensitive     * FEW STAPHYLOCOCCUS AUREUS     Time coordinating discharge:  35 minutes  SIGNED:   Barb Merino, MD  [facility] 07/11/2019, 2:04 PM

## 2019-07-11 NOTE — Progress Notes (Signed)
Nutrition Follow-up  DOCUMENTATION CODES:   Not applicable  INTERVENTION:  - Continue renal MVI daily  -d/c Nepro Shake po BID, each supplement provides 425 kcal and 19 grams protein  -Continue Pro-stat 30 ml po BID, each supplement provides 100 kcal and 15 grams protein  -Utilize protein supplement from outpatient dialysis center   NUTRITION DIAGNOSIS:   Increased nutrient needs related to acute illness, chronic illness(cellulitis, ESRD on HD) as evidenced by estimated needs.  Ongoing.  GOAL:   Patient will meet greater than or equal to 90% of their needs  Progressing.  MONITOR:   PO intake, Supplement acceptance, Labs, Weight trends, Skin, I & O's  REASON FOR ASSESSMENT:   Malnutrition Screening Tool    ASSESSMENT:   49 year old female who presented on 5/07 with N/V/D and right buttock pain. PMH of ESRD on HD, lupus on prednisone, CAD s/p stenting on plavix, T2DM. CT abdomen/pelvis showing features concerning for right perinephric stranding right ovarian lesion and also cellulitis of right buttocks. Admitted with possible sepsis.  Pt just returned to room from HD at time of visit and appeared fatigued. Pt states her appetite is alright, but reports not liking the Nepro shakes as they cause her to have diarrhea. Pt has been drinking the Pro-stat and is agreeable to utilizing the protein supplements she receives from her dialysis center while admitted (pt could not remember name but thinks she has some available).   PO intake: 0-100% x last 8 recorded meals (65% average intake)  EDW: 69.5 kg Current wt: 72.1 kg  Labs reviewed. Medications reviewed and include: Aranesp, 45ml Prostat BID, Nepro BID, Novolog, Renavite, Deltasone  Diet Order:   Diet Order            Diet - low sodium heart healthy        Diet renal/carb modified with fluid restriction Diet-HS Snack? Nothing; Fluid restriction: 1200 mL Fluid; Room service appropriate? Yes; Fluid consistency: Thin   Diet effective now              EDUCATION NEEDS:   No education needs have been identified at this time  Skin:  Skin Assessment: Skin Integrity Issues: Skin Integrity Issues:: Incisions, Other (Comment) Incisions: R buttocks Other: MASD abdomen, groin  Last BM:  5/11  Height:   Ht Readings from Last 1 Encounters:  07/09/19 5' 0.98" (1.549 m)    Weight:   Wt Readings from Last 1 Encounters:  07/11/19 (P) 72.1 kg    BMI:  Body mass index is 30.05 kg/m (pended).  Estimated Nutritional Needs:   Kcal:  [id number]  Protein:  85-100 grams  Fluid:  1000 ml + UOP    Larkin Ina, MS, RD, LDN RD pager number and weekend/on-call pager number located in [place].

## 2019-07-11 NOTE — Progress Notes (Signed)
[facility] Progress Note   Subjective:   Seen on HD.  Feeling much better.  No complaints today  Objective Vitals:   07/10/19 1651 07/10/19 2115 07/10/19 2131 07/11/19 0436  BP: (!) 114/57 133/68  121/64  Pulse:  76  73  Resp: 18 18  12   Temp: 98.1 F (36.7 C) 98.2 F (36.8 C)  98.1 F (36.7 C)  TempSrc: Oral   Oral  SpO2:  96%  99%  Weight:   70.8 kg   Height:       Physical Exam General:WD, obese female in NAD Heart:RRR Lungs:CTAB Abdomen:soft, NTND, +BS Extremities:no LE edema Dialysis Access: LU AVG +b   Filed Weights   07/09/19 0445 07/09/19 1141 07/10/19 2131  Weight: 69.4 kg 69.4 kg 70.8 kg    Intake/Output Summary (Last 24 hours) at 07/11/2019 0921 Last data filed at 07/11/2019 0208 Gross per 24 hour  Intake 520 ml  Output 0 ml  Net 520 ml    Additional Objective Labs: Basic Metabolic Panel: Recent Labs  Lab 07/06/19 1122 07/08/19 1120 07/09/19 0559 07/11/19 0548  NA 134*  --  136 135  K 3.5  --  3.0* 3.7  CL 95*  --  96* 98  CO2 24  --  27 24  GLUCOSE 115*  --  93 96  BUN 25*  --  12 51*  CREATININE 8.16*  --  4.95* 10.54*  CALCIUM 8.6*  --  8.1* 8.5*  PHOS  --  4.9*  --   --    Liver Function Tests: Recent Labs  Lab 07/04/19 1238 07/06/19 1122  AST 53* 95*  ALT 18 35  ALKPHOS 83 69  BILITOT 0.8 0.7  PROT 7.1 6.1*  ALBUMIN 3.0* 2.3*   Recent Labs  Lab 07/04/19 1238  LIPASE 50   CBC: Recent Labs  Lab 07/04/19 1238 07/04/19 1238 07/06/19 1122 07/09/19 0559 07/11/19 0548  WBC 17.7*   < > 11.5* 11.8* 10.4  NEUTROABS  --   --  9.3*  --  6.9  HGB 12.0   < > 10.1* 9.9* 7.9*  HCT 38.4   < > 31.5* 30.3* 24.7*  MCV 98.0  --  95.7 94.1 96.1  PLT 162   < > 150 230 265   < > = values in this interval not displayed.   CBG: Recent Labs  Lab 07/10/19 0641 07/10/19 1111 07/10/19 1630 07/10/19 2130 07/11/19 0639  GLUCAP 172* 248* 230* 144* 81   Iron Studies:  Recent Labs    07/09/19 0559  IRON 61  TIBC  158*   Lab Results  Component Value Date   INR 1.20 12/15/2017   Studies/Results: No results found.  Medications: . sodium chloride    . sodium chloride    . vancomycin 750 mg (07/05/19 1601)  . vancomycin     . acidophilus  1 capsule Oral Daily  . amLODipine  10 mg Oral Daily  . aspirin EC  81 mg Oral Daily  . atorvastatin  40 mg Oral Daily  . Chlorhexidine Gluconate Cloth  6 each Topical Q0600  . clopidogrel  75 mg Oral Daily  . darbepoetin (ARANESP) injection - DIALYSIS  60 mcg Intravenous Q Thu-HD  . feeding supplement (NEPRO CARB STEADY)  237 mL Oral BID BM  . feeding supplement (PRO-STAT SUGAR FREE 64)  30 mL Oral BID  . ferric citrate  420 mg Oral TID WC  . insulin aspart  0-6 Units Subcutaneous TID  WC  . insulin aspart protamine- aspart  10 Units Subcutaneous BID WC  . isosorbide mononitrate  30 mg Oral Daily  . metoprolol tartrate  25 mg Oral BID  . multivitamin  1 tablet Oral QHS  . nystatin  1 application Topical BID  . pantoprazole  40 mg Oral Daily  . predniSONE  5 mg Oral Q breakfast    Dialysis Orders: [facility] 3.25hrs, BFR400, [id number], EDW 69.5kg,2K/2.25Ca  Access:LU AVG Heparinnone Mircera43mcg q4wks - last4/27 Hectorol53mcg IV qHD   Assessment/Plan: 1. Fever/chills- thought to be 2/2 cellulitis/abscess: CT showed likely cellulitis of R buttock and possible pyelonephritis vs infected cyst. Renal US5/9--> no hydro, some benign-appearing cysts. BC NGTD. On vanc/ meropenem (07/05/19-)Afebrile.  S/p I and D 5/13. Per surgery Gm stain showing Gm + cocci in pairs, cultures pending  2. Abnormal ovarian lesion- incidental findings on CT. will need additional workup. 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 3. ESRD- On HD TTS. Declined HD yesterday, off schedule today, orders for tomorrow. 4. Hypertension/volume- BP well controlled. Does not appear volume overloaded.  Titrate down as tolerated. 5. Anemiaof CKD- last Hgb 9.9. ESA not given yesterday, for today 6. Secondary Hyperparathyroidism -Ca and phos at goal. Continue VDRA and binders.  7. Nutrition- Renal diet w/fluid restrictions.  8. Lupus- on chronic prednisone 9. CAD s/p stenting- per primary 10. DMT2- on insulin, per primary  [facility] Pager: [phone] 07/11/2019,9:21 AM  LOS: 6 days

## 2019-07-11 NOTE — Procedures (Signed)
Patient seen and examined on Hemodialysis. BP 121/64 (BP Location: Right Arm)   Pulse 73   Temp 98.1 F (36.7 C) (Oral)   Resp 12   Ht 5' 0.98" (1.549 m)   Wt 70.8 kg   SpO2 99%   BMI 29.51 kg/m   QB 400 mL/ min via AVG, UF goal 2L  Tolerating treatment without complaints at this time.  Madelon Lips MD [facility] pgr [phone] 9:28 AM

## 2019-07-11 NOTE — Progress Notes (Addendum)
Discharge instructions given including dressing change. Patient and daughter verbalized understanding and all question were answered.

## 2019-07-12 ENCOUNTER — Telehealth (HOSPITAL_COMMUNITY): Payer: Self-pay | Admitting: Nephrology

## 2019-07-12 NOTE — Telephone Encounter (Signed)
Transition of care contact from inpatient facility  Date of discharge: 07/11/2019 Date of contact: 07/02/2019 Method: Phone Spoke to: Patient  Patient contacted to discuss transition of care from recent inpatient hospitalization. Patient was admitted to [facility] from 07/04/2019 - 07/11/2019 with discharge diagnosis of buttock cellulitis/abscess.  Medication changes were reviewed. She has picked up Keflex and taking as prescribed.  She has been in touch with home health - they are coming tomorrow to her home to assist with dressing changes. In the mean time, she has supplies to change it today.  She was scheduled for dialysis today - unfortunately she did not go to her treatment, says "wasn't feeling up to it." She spoke with her clinic already -> they told her to watch her fluids and K intake - next HD Tues 5/18. Told to call clinic for treatment Monday if begins to feel poorly, weak, or dyspneic.  Veneta Penton, PA-C [name] Pager [phone]

## 2019-07-14 LAB — AEROBIC/ANAEROBIC CULTURE W GRAM STAIN (SURGICAL/DEEP WOUND)

## 2019-07-17 ENCOUNTER — Telehealth: Payer: Self-pay | Admitting: *Deleted

## 2019-07-17 NOTE — Telephone Encounter (Signed)
OK to hold plavix for 5 days prior to colonoscopy

## 2019-07-17 NOTE — Telephone Encounter (Signed)
   [facility] Pre-operative Risk Assessment    HEARTCARE STAFF: - Please ensure there is not already an duplicate clearance open for this procedure - Under Visit Info/Reason for Call, type in Other and utilize the format Clearance MM/DD/YY or Clearance TBD  Request for surgical clearance:  1. What type of surgery is being performed? COLONOSCOPY/ENDOSCOPY   2. When is this surgery scheduled? TBD (June OR July ANTICIPATED)   3. What type of clearance is required (medical clearance vs. Pharmacy clearance to hold med vs. Both)? MEDICAL  4. Are there any medications that need to be held prior to surgery and how long? PLAVIX X 5 DAYS PRIOR   5. Practice name and name of physician performing surgery? [facility]; DR. Alessandra Bevels   6. What is the office phone number? [phone]   7.   What is the office fax number?  [phone]  8.   Anesthesia type (None, local, MAC, general) ? PROPOFOL   Julaine Hua 07/17/2019, 12:24 PM  _________________________________________________________________   (provider comments below)

## 2019-07-17 NOTE — Telephone Encounter (Signed)
Dr. Acie Fredrickson  This patient is due for colonoscopy and the GI team is requesting to hold Plavix for 5 days prior to procedure.  Can you please comment on holding antiplatelet therapy?  She was last seen 01/2019 at which time she had an echocardiogram as well as a [place] study for work-up for possible renal transplant.  Both were negative with no acute issues. She has a hx of lupus, end-stage renal disease on hemodialysis, hypertension, diabetes mellitus, GI bleed. She also has a history of coronary artery disease with a previous stent placed in [place] and a recent PCI/DES to the left circumflex artery in 03/2018.  She has a 90% stenosis in her mid RCA that is being treated medically.    Please send her recommendations to the preoperative pool for review.  Thank you  Sharee Pimple

## 2019-07-21 ENCOUNTER — Other Ambulatory Visit: Payer: Self-pay | Admitting: *Deleted

## 2019-07-21 MED ORDER — PANTOPRAZOLE SODIUM 40 MG PO TBEC: 40.0000 mg | DELAYED_RELEASE_TABLET | Freq: Every day | ORAL | 0 refills | Status: DC

## 2019-07-21 NOTE — Telephone Encounter (Signed)
   Primary Cardiologist: Mertie Moores, MD  Chart reviewed as part of pre-operative protocol coverage. Given past medical history and time since last visit, based on ACC/AHA guidelines, Paula Bass would be at acceptable risk for the planned procedure without further cardiovascular testing.   His Plavix may be held for 5 days prior to his colonoscopy.  Please resume as soon as hemostasis is achieved.  I will route this recommendation to the requesting party via Epic fax function and remove from pre-op pool.  Please call with questions.  [name] Verlin Duke NP-C    07/21/2019, 10:58 AM [place] [address] Office [phone] Fax [phone]

## 2019-07-23 ENCOUNTER — Other Ambulatory Visit: Payer: Self-pay | Admitting: Gastroenterology

## 2019-08-18 ENCOUNTER — Telehealth: Payer: Self-pay | Admitting: *Deleted

## 2019-08-18 ENCOUNTER — Ambulatory Visit: Payer: Medicare HMO | Admitting: Podiatry

## 2019-08-18 NOTE — Telephone Encounter (Signed)
Pt called Friday 08/15/2019 at 4:25pm to cancel appt 08/18/2019.

## 2019-09-12 ENCOUNTER — Other Ambulatory Visit (HOSPITAL_COMMUNITY): Payer: Medicare HMO

## 2019-09-16 ENCOUNTER — Encounter (HOSPITAL_COMMUNITY): Payer: Self-pay

## 2019-09-16 ENCOUNTER — Ambulatory Visit (HOSPITAL_COMMUNITY): Admit: 2019-09-16 | Payer: Medicare HMO | Admitting: Gastroenterology

## 2019-09-16 SURGERY — COLONOSCOPY WITH PROPOFOL
Anesthesia: Monitor Anesthesia Care

## 2019-10-16 ENCOUNTER — Encounter (HOSPITAL_COMMUNITY): Payer: Self-pay | Admitting: Gastroenterology

## 2019-10-21 ENCOUNTER — Other Ambulatory Visit (HOSPITAL_COMMUNITY): Payer: Medicare HMO

## 2019-10-22 ENCOUNTER — Other Ambulatory Visit (HOSPITAL_COMMUNITY)
Admission: RE | Admit: 2019-10-22 | Discharge: 2019-10-22 | Disposition: A | Discharge status: Home / Self Care | Payer: Medicare HMO | Source: Ambulatory Visit | Attending: Gastroenterology | Admitting: Gastroenterology

## 2019-10-22 DIAGNOSIS — Z20822 Contact with and (suspected) exposure to covid-19: Secondary | ICD-10-CM | POA: Diagnosis not present

## 2019-10-22 DIAGNOSIS — Z01812 Encounter for preprocedural laboratory examination: Secondary | ICD-10-CM | POA: Diagnosis present

## 2019-10-22 LAB — SARS CORONAVIRUS 2 (TAT 6-24 HRS): SARS Coronavirus 2: NEGATIVE

## 2019-10-24 ENCOUNTER — Ambulatory Visit (HOSPITAL_COMMUNITY): Payer: Medicare HMO | Admitting: Certified Registered Nurse Anesthetist

## 2019-10-24 ENCOUNTER — Encounter (HOSPITAL_COMMUNITY)
Admission: RE | Disposition: A | Discharge status: Home / Self Care | Payer: Self-pay | Source: Home / Self Care | Attending: Gastroenterology

## 2019-10-24 ENCOUNTER — Encounter (HOSPITAL_COMMUNITY): Payer: Self-pay | Admitting: Gastroenterology

## 2019-10-24 ENCOUNTER — Other Ambulatory Visit: Payer: Self-pay

## 2019-10-24 ENCOUNTER — Ambulatory Visit (HOSPITAL_COMMUNITY)
Admission: RE | Admit: 2019-10-24 | Discharge: 2019-10-24 | Disposition: A | Discharge status: Home / Self Care | Payer: Medicare HMO | Attending: Gastroenterology | Admitting: Gastroenterology

## 2019-10-24 DIAGNOSIS — K219 Gastro-esophageal reflux disease without esophagitis: Secondary | ICD-10-CM | POA: Insufficient documentation

## 2019-10-24 DIAGNOSIS — Z7982 Long term (current) use of aspirin: Secondary | ICD-10-CM | POA: Diagnosis not present

## 2019-10-24 DIAGNOSIS — K209 Esophagitis, unspecified without bleeding: Secondary | ICD-10-CM | POA: Insufficient documentation

## 2019-10-24 DIAGNOSIS — K2970 Gastritis, unspecified, without bleeding: Secondary | ICD-10-CM | POA: Insufficient documentation

## 2019-10-24 DIAGNOSIS — M3214 Glomerular disease in systemic lupus erythematosus: Secondary | ICD-10-CM | POA: Insufficient documentation

## 2019-10-24 DIAGNOSIS — N186 End stage renal disease: Secondary | ICD-10-CM | POA: Insufficient documentation

## 2019-10-24 DIAGNOSIS — D125 Benign neoplasm of sigmoid colon: Secondary | ICD-10-CM | POA: Insufficient documentation

## 2019-10-24 DIAGNOSIS — Z7902 Long term (current) use of antithrombotics/antiplatelets: Secondary | ICD-10-CM | POA: Diagnosis not present

## 2019-10-24 DIAGNOSIS — K5730 Diverticulosis of large intestine without perforation or abscess without bleeding: Secondary | ICD-10-CM | POA: Diagnosis not present

## 2019-10-24 DIAGNOSIS — L814 Other melanin hyperpigmentation: Secondary | ICD-10-CM | POA: Diagnosis not present

## 2019-10-24 DIAGNOSIS — D124 Benign neoplasm of descending colon: Secondary | ICD-10-CM | POA: Insufficient documentation

## 2019-10-24 DIAGNOSIS — K625 Hemorrhage of anus and rectum: Secondary | ICD-10-CM | POA: Diagnosis present

## 2019-10-24 DIAGNOSIS — Z794 Long term (current) use of insulin: Secondary | ICD-10-CM | POA: Insufficient documentation

## 2019-10-24 DIAGNOSIS — E785 Hyperlipidemia, unspecified: Secondary | ICD-10-CM | POA: Insufficient documentation

## 2019-10-24 DIAGNOSIS — D123 Benign neoplasm of transverse colon: Secondary | ICD-10-CM | POA: Diagnosis not present

## 2019-10-24 DIAGNOSIS — I120 Hypertensive chronic kidney disease with stage 5 chronic kidney disease or end stage renal disease: Secondary | ICD-10-CM | POA: Diagnosis not present

## 2019-10-24 DIAGNOSIS — Z7952 Long term (current) use of systemic steroids: Secondary | ICD-10-CM | POA: Diagnosis not present

## 2019-10-24 DIAGNOSIS — I2510 Atherosclerotic heart disease of native coronary artery without angina pectoris: Secondary | ICD-10-CM | POA: Diagnosis not present

## 2019-10-24 DIAGNOSIS — Z992 Dependence on renal dialysis: Secondary | ICD-10-CM | POA: Diagnosis not present

## 2019-10-24 DIAGNOSIS — K648 Other hemorrhoids: Secondary | ICD-10-CM | POA: Insufficient documentation

## 2019-10-24 DIAGNOSIS — K529 Noninfective gastroenteritis and colitis, unspecified: Secondary | ICD-10-CM | POA: Diagnosis not present

## 2019-10-24 DIAGNOSIS — D631 Anemia in chronic kidney disease: Secondary | ICD-10-CM | POA: Insufficient documentation

## 2019-10-24 DIAGNOSIS — Z79899 Other long term (current) drug therapy: Secondary | ICD-10-CM | POA: Insufficient documentation

## 2019-10-24 DIAGNOSIS — E1122 Type 2 diabetes mellitus with diabetic chronic kidney disease: Secondary | ICD-10-CM | POA: Diagnosis not present

## 2019-10-24 DIAGNOSIS — Z955 Presence of coronary angioplasty implant and graft: Secondary | ICD-10-CM | POA: Insufficient documentation

## 2019-10-24 HISTORY — PX: BIOPSY: SHX5522

## 2019-10-24 HISTORY — PX: COLONOSCOPY WITH PROPOFOL: SHX5780

## 2019-10-24 HISTORY — PX: ESOPHAGOGASTRODUODENOSCOPY (EGD) WITH PROPOFOL: SHX5813

## 2019-10-24 HISTORY — PX: POLYPECTOMY: SHX5525

## 2019-10-24 SURGERY — COLONOSCOPY WITH PROPOFOL
Anesthesia: Monitor Anesthesia Care

## 2019-10-24 MED ORDER — PROPOFOL 10 MG/ML IV BOLUS
INTRAVENOUS | Status: DC | PRN
  Administered 2019-10-24 (×2): 20 mg via INTRAVENOUS

## 2019-10-24 MED ORDER — PANTOPRAZOLE SODIUM 40 MG PO TBEC
40.0000 mg | DELAYED_RELEASE_TABLET | Freq: Every day | ORAL | 1 refills | Status: DC
Start: 2019-10-24 — End: 2020-06-07

## 2019-10-24 MED ORDER — LIDOCAINE 2% (20 MG/ML) 5 ML SYRINGE
INTRAMUSCULAR | Status: DC | PRN
  Administered 2019-10-24: 40 mg via INTRAVENOUS

## 2019-10-24 MED ORDER — PROPOFOL 500 MG/50ML IV EMUL
INTRAVENOUS | Status: DC | PRN
  Administered 2019-10-24: 100 ug/kg/min via INTRAVENOUS

## 2019-10-24 MED ORDER — PHENYLEPHRINE 40 MCG/ML (10ML) SYRINGE FOR IV PUSH (FOR BLOOD PRESSURE SUPPORT)
PREFILLED_SYRINGE | INTRAVENOUS | Status: DC | PRN
  Administered 2019-10-24: 120 ug via INTRAVENOUS

## 2019-10-24 MED ORDER — SODIUM CHLORIDE 0.9 % IV SOLN: INTRAVENOUS | Status: DC

## 2019-10-24 SURGICAL SUPPLY — 24 items

## 2019-10-24 NOTE — Discharge Instructions (Signed)
YOU HAD AN ENDOSCOPIC PROCEDURE TODAY: Refer to the procedure report and other information in the discharge instructions given to you for any specific questions about what was found during the examination. If this information does not answer your questions, please call [place] office at [phone] to clarify.  ° °YOU SHOULD EXPECT: Some feelings of bloating in the abdomen. Passage of more gas than usual. Walking can help get rid of the air that was put into your GI tract during the procedure and reduce the bloating. If you had a lower endoscopy (such as a colonoscopy or flexible sigmoidoscopy) you may notice spotting of blood in your stool or on the toilet paper. Some abdominal soreness may be present for a day or two, also. ° °DIET: Your first meal following the procedure should be a light meal and then it is ok to progress to your normal diet. A half-sandwich or bowl of soup is an example of a good first meal. Heavy or fried foods are harder to digest and may make you feel nauseous or bloated. Drink plenty of fluids but you should avoid alcoholic beverages for 24 hours. If you had a esophageal dilation, please see attached instructions for diet.   ° °ACTIVITY: Your care partner should take you home directly after the procedure. You should plan to take it easy, moving slowly for the rest of the day. You can resume normal activity the day after the procedure however YOU SHOULD NOT DRIVE, use power tools, machinery or perform tasks that involve climbing or major physical exertion for 24 hours (because of the sedation medicines used during the test).  ° °SYMPTOMS TO REPORT IMMEDIATELY: °A gastroenterologist can be reached at any hour. Please call [phone]  for any of the following symptoms:  °Following lower endoscopy (colonoscopy, flexible sigmoidoscopy) °Excessive amounts of blood in the stool  °Significant tenderness, worsening of abdominal pains  °Swelling of the abdomen that is new, acute  °Fever of 100° or  higher  °Following upper endoscopy (EGD, EUS, ERCP, esophageal dilation) °Vomiting of blood or coffee ground material  °New, significant abdominal pain  °New, significant chest pain or pain under the shoulder blades  °Painful or persistently difficult swallowing  °New shortness of breath  °Black, tarry-looking or red, bloody stools ° °FOLLOW UP:  °If any biopsies were taken you will be contacted by phone or by letter within the next 1-3 weeks. Call [phone]  if you have not heard about the biopsies in 3 weeks.  °Please also call with any specific questions about appointments or follow up tests. ° °

## 2019-10-24 NOTE — Transfer of Care (Signed)
Immediate Anesthesia Transfer of Care Note  Patient: Paula Bass  Procedure(s) Performed: COLONOSCOPY WITH PROPOFOL (N/A ) ESOPHAGOGASTRODUODENOSCOPY (EGD) WITH PROPOFOL (N/A )  Patient Location: PACU and Endoscopy Unit  Anesthesia Type:MAC  Level of Consciousness: awake, alert , oriented and patient cooperative  Airway & Oxygen Therapy: Patient Spontanous Breathing and Patient connected to face mask oxygen  Post-op Assessment: Report given to RN and Post -op Vital signs reviewed and stable  Post vital signs: Reviewed and stable  Last Vitals:  Vitals Value Taken Time  BP    Temp    Pulse 79 10/24/19 1318  Resp 28 10/24/19 1318  SpO2 100 % 10/24/19 1318  Vitals shown include unvalidated device data.  Last Pain:  Vitals:   10/24/19 1159  TempSrc: Oral  PainSc: 0-No pain         Complications: No complications documented.

## 2019-10-24 NOTE — H&P (Signed)
Primary Care Physician:  Benito Mccreedy, MD Primary Gastroenterologist:  Dr. Alessandra Bevels  Reason for Visit : EGD and colonoscopy for evaluation of GI bleed  HPI: Paula Bass is a 49 y.o. female    This is a 49 year old patient with past medical history of coronary artery disease status post PCI in February 2020 currently on aspirin and Plavix, history of end-stage renal disease on dialysis, history of diverticulitis diagnosed sometimes in November or December 2020 was seen in the office in October 2020 for evaluation of GI bleed. Cardiology has recommended to continue dual antiplatelet till February 2021 and she was advised to follow up with [place] in 4 months.        She was admitted to the hospital in May 2021 with fever and chills. She was diagnosed with pyelonephritis and perirectal abscess. Underwent I and D of perirectal abscess On Jul 09, 2019.                 Patient had an episode of rectal bleeding after she was diagnosed with diverticulitis sometimes in January 2020. on November 18, 2018. They have ecommended to hold off on elective procedure till February 2021.  She denies any further dark stool or blood in the stool.  Denies abdominal pain, nausea or vomiting.  Denies reflux, dysphagia and odynophagia.  Overall no acute GI symptoms.  She was not able to tolerate the prep and had few episodes of nausea and vomiting.         Was previously followed by [facility].  Past Medical History:  Diagnosis Date  . Acute diverticulitis 12/01/2017  . CAD (coronary artery disease) 2016  . CAD (coronary artery disease) 04/19/2018   Hx of PCI to [facility] in [place] in 2017 // [place] 03/2018: dLCx 31, OM1 40, OM2 50, pRCA stent ok, mid RCA 90, EF 55-65, PCI: DES to dLCx  . Diabetes mellitus without complication ([place])   . Dialysis patient ([place])   . Diverticulitis   . GI bleeding   . Hypertension   . LGI bleed 12/16/2017  . Lower GI bleed   . Lupus ([place])   . Renal disorder    dialysis  . UTI  (urinary tract infection) 12/01/2017    Past Surgical History:  Procedure Laterality Date  . CARDIAC SURGERY     cardiac shunt  . CORONARY STENT INTERVENTION N/A 04/19/2018   Procedure: CORONARY STENT INTERVENTION;  Surgeon: [name], MD;  Location: [facility];  Service: Cardiovascular;  Laterality: N/A;  . INCISION AND DRAINAGE PERIRECTAL ABSCESS N/A 07/09/2019   Procedure: IRRIGATION AND DEBRIDEMEN right buttock ABSCESS;  Surgeon: Kieth Brightly, Arta Bruce, MD;  Location: [facility];  Service: General;  Laterality: N/A;  . Kidney graft Left   . LEFT HEART CATH AND CORONARY ANGIOGRAPHY N/A 04/19/2018   Procedure: LEFT HEART CATH AND CORONARY ANGIOGRAPHY;  Surgeon: [name], MD;  Location: [facility];  Service: Cardiovascular;  Laterality: N/A;    Prior to Admission medications   Medication Sig Start Date End Date Taking? Authorizing Provider  amLODipine (NORVASC) 10 MG tablet Take 10 mg by mouth daily.   Yes [provider]  aspirin EC 81 MG tablet Take 1 tablet (81 mg total) by mouth daily. 04/22/18  Yes Reino Bellis B, NP  atorvastatin (LIPITOR) 40 MG tablet Take 40 mg by mouth daily.  10/25/17  Yes [provider]  AURYXIA 1 GM 210 MG(Fe) tablet Take 210-420 tablets by mouth See admin instructions. Take  420 mg by mouth three times a day with meals and 210 mg twice a day with a snack 06/09/19  Yes [provider]  clopidogrel (PLAVIX) 75 MG tablet Take 1 tablet (75 mg total) by mouth daily. 04/22/18  Yes Cheryln Manly, NP  insulin NPH-regular Human (70-30) 100 UNIT/ML injection Inject 30-40 Units into the skin See admin instructions. Inject 40 units in the morning and 30 units at bedtime.    Yes [provider]  isosorbide mononitrate (IMDUR) 30 MG 24 hr tablet Take 1 tablet (30 mg total) by mouth daily. 11/18/18  Yes Daune Perch, NP  Lactobacillus (ACIDOPHILUS) CAPS capsule Take 1 capsule by mouth daily.   Yes [provider]  lidocaine-prilocaine (EMLA) cream Apply 1 application topically Every Tuesday,Thursday,and Saturday with dialysis.  11/18/17  Yes [provider]  metoprolol tartrate (LOPRESSOR) 25 MG tablet Take 1 tablet (25 mg total) by mouth 2 (two) times daily. 06/16/19  Yes Nahser, Wonda Cheng, MD  multivitamin (RENA-VIT) TABS tablet Take 1 tablet by mouth daily.   Yes [provider]  nitroGLYCERIN (NITROSTAT) 0.4 MG SL tablet Place 1 tablet (0.4 mg total) under the tongue every 5 (five) minutes as needed. 04/22/18  Yes Cheryln Manly, NP  Polyethyl Glycol-Propyl Glycol (LUBRICANT EYE DROPS) 0.4-0.3 % SOLN Place 1 drop into both eyes 3 (three) times daily as needed (dry/irritated eyes.).   Yes [provider]  predniSONE (DELTASONE) 5 MG tablet Take 5 mg by mouth daily with breakfast.  05/14/19  Yes [provider]  acetaminophen (TYLENOL) 325 MG tablet Take 2 tablets (650 mg total) by mouth every 4 (four) hours as needed for fever, headache or mild pain. Patient not taking: Reported on 10/20/2019 07/10/19   Saverio Danker, PA-C  ammonium lactate (AMLACTIN) 12 % lotion Apply 1 application topically as needed for dry skin. Patient not taking: Reported on 09/05/2019 05/16/19   Felipa Furnace, DPM  oxyCODONE (OXY IR/ROXICODONE) 5 MG immediate release tablet Take 1 tablet (5 mg total) by mouth every 4 (four) hours as needed for moderate pain or severe pain (5mg  for moderate pain, 10mg  for severe pain). Patient not taking: Reported on 09/05/2019 07/10/19   Saverio Danker, PA-C  pantoprazole (PROTONIX) 40 MG tablet Take 1 tablet (40 mg total) by mouth daily. Needs office visit. Patient not taking: Reported on 10/20/2019 07/21/19   Levin Erp, PA    Scheduled Meds: Continuous Infusions: PRN Meds:.  Allergies as of 09/16/2019 - Review Complete 09/05/2019  Allergen Reaction Noted  . Penicillins Hives 06/20/2017    Family History  Problem Relation Age of  Onset  . Hypertension Mother   . Other Father        unkown health history  . Healthy Brother   . Healthy Brother   . Colon cancer Neg Hx   . Esophageal cancer Neg Hx   . Breast cancer Neg Hx     Social History   Socioeconomic History  . Marital status: Single    Spouse name: Not on file  . Number of children: 1  . Years of education: Not on file  . Highest education level: Not on file  Occupational History  . Occupation: disabled  Tobacco Use  . Smoking status: Never Smoker  . Smokeless tobacco: Never Used  Vaping Use  . Vaping Use: Never used  Substance and Sexual Activity  . Alcohol use: Never  . Drug use: Never  . Sexual activity: Yes  Partners: Male    Birth control/protection: Condom  Other Topics Concern  . Not on file  Social History Narrative  . Not on file   Social Determinants of Health   Financial Resource Strain:   . Difficulty of Paying Living Expenses: Not on file  Food Insecurity:   . Worried About Charity fundraiser in the Last Year: Not on file  . Ran Out of Food in the Last Year: Not on file  Transportation Needs:   . Lack of Transportation (Medical): Not on file  . Lack of Transportation (Non-Medical): Not on file  Physical Activity:   . Days of Exercise per Week: Not on file  . Minutes of Exercise per Session: Not on file  Stress:   . Feeling of Stress : Not on file  Social Connections:   . Frequency of Communication with Friends and Family: Not on file  . Frequency of Social Gatherings with Friends and Family: Not on file  . Attends Religious Services: Not on file  . Active Member of Clubs or Organizations: Not on file  . Attends Archivist Meetings: Not on file  . Marital Status: Not on file  Intimate Partner Violence:   . Fear of Current or Ex-Partner: Not on file  . Emotionally Abused: Not on file  . Physically Abused: Not on file  . Sexually Abused: Not on file     Physical Exam: Vital signs: There were no  vitals filed for this visit.   General:   Alert,  Well-developed, well-nourished, pleasant and cooperative in NAD Lungs:  Clear throughout to auscultation.   No wheezes, crackles, or rhonchi. No acute distress. Heart:  Regular rate and rhythm; no murmurs, clicks, rubs,  or gallops. Abdomen: Soft, nontender, nondistended, bowel sounds present Rectal:  Deferred  GI:  Lab Results: No results for input(s): WBC, HGB, HCT, PLT in the last 72 hours. BMET No results for input(s): NA, K, CL, CO2, GLUCOSE, BUN, CREATININE, CALCIUM in the last 72 hours. LFT No results for input(s): PROT, ALBUMIN, AST, ALT, ALKPHOS, BILITOT, BILIDIR, IBILI in the last 72 hours. PT/INR No results for input(s): LABPROT, INR in the last 72 hours.   Studies/Results: No results found.  Impression/Plan: -History of rectal bleeding and dark stools. -Coronary artery disease.  Plavix on hold for 5 days -History of diverticulitis and perirectal abscess.  Recommendations ------------------------- -Proceed with EGD and colonoscopy today.  Risks (bleeding, infection, bowel perforation that could require surgery, sedation-related changes in cardiopulmonary systems), benefits (identification and possible treatment of source of symptoms, exclusion of certain causes of symptoms), and alternatives (watchful waiting, radiographic imaging studies, empiric medical treatment)  were explained to patient/family in detail and patient wishes to proceed.    LOS: 0 days   Otis Brace  MD, [place] 10/24/2019, 10:51 AM  Contact #  [phone]

## 2019-10-24 NOTE — Op Note (Signed)
[facility] Patient Name: Paula Bass Procedure Date: 10/24/2019 MRN: [id number] Attending MD: Otis Brace , MD Date of Birth: June 02, 1970 CSN: [id number] Age: 49 Admit Type: Outpatient Procedure:                Colonoscopy Indications:              This is the patient's first colonoscopy, Rectal                            bleeding Providers:                Otis Brace, MD, Clyde Lundborg, RN, Fransico Setters                            Mbumina, Technician Referring MD:              Medicines:                Sedation Administered by an Anesthesia Professional Complications:            No immediate complications. Estimated Blood Loss:     Estimated blood loss was minimal. Procedure:                Pre-Anesthesia Assessment:                           - Prior to the procedure, a History and Physical                            was performed, and patient medications and                            allergies were reviewed. The patient's tolerance of                            previous anesthesia was also reviewed. The risks                            and benefits of the procedure and the sedation                            options and risks were discussed with the patient.                            All questions were answered, and informed consent                            was obtained. Prior Anticoagulants: The patient has                            taken Plavix (clopidogrel), last dose was 5 days                            prior to procedure. ASA Grade Assessment: III - A  patient with severe systemic disease. After                            reviewing the risks and benefits, the patient was                            deemed in satisfactory condition to undergo the                            procedure.                           - Prior to the procedure, a History and Physical                            was performed, and patient medications  and                            allergies were reviewed. The patient's tolerance of                            previous anesthesia was also reviewed. The risks                            and benefits of the procedure and the sedation                            options and risks were discussed with the patient.                            All questions were answered, and informed consent                            was obtained. Prior Anticoagulants: The patient has                            taken Plavix (clopidogrel), last dose was 5 days                            prior to procedure. ASA Grade Assessment: III - A                            patient with severe systemic disease. After                            reviewing the risks and benefits, the patient was                            deemed in satisfactory condition to undergo the                            procedure.  After obtaining informed consent, the colonoscope                            was passed under direct vision. Throughout the                            procedure, the patient's blood pressure, pulse, and                            oxygen saturations were monitored continuously. The                            [id number]-H190DL ([id number] Olympus pediatric colonscope                            was introduced through the anus and advanced to the                            the terminal ileum, with identification of the                            appendiceal orifice and IC valve. The colonoscopy                            was performed without difficulty. The patient                            tolerated the procedure well. The quality of the                            bowel preparation was adequate to identify polyps 6                            mm and larger in size. Scope In: 12:47:04 PM Scope Out: 1:09:39 PM Scope Withdrawal Time: 0 hours 14 minutes 38 seconds  Total Procedure Duration: 0 hours 22 minutes 35  seconds  Findings:      The perianal and digital rectal examinations were normal.      The terminal ileum appeared normal.      A diffuse area of moderate melanosis was found in the entire colon.      A 4 mm polyp was found in the transverse colon. The polyp was sessile.       The polyp was removed with a cold snare. Resection and retrieval were       complete.      Two sessile polyps were found in the descending colon. The polyps were 4       to 6 mm in size. These polyps were removed with a cold snare. Resection       and retrieval were complete.      Two sessile polyps were found in the sigmoid colon. The polyps were 4 to       5 mm in size. These polyps were removed with a cold snare. Resection and       retrieval were complete.      Multiple diverticula were found in the entire  colon.      A localized area of moderately congested and inflamed mucosa was found       in the sigmoid colon. Biopsies were taken with a cold forceps for       histology.      Anal papilla(e) were hypertrophied. Biopsies were taken with a cold       forceps for histology.      Internal hemorrhoids were found during retroflexion. The hemorrhoids       were small. Impression:               - The examined portion of the ileum was normal.                           - Melanosis in the colon.                           - One 4 mm polyp in the transverse colon, removed                            with a cold snare. Resected and retrieved.                           - Two 4 to 6 mm polyps in the descending colon,                            removed with a cold snare. Resected and retrieved.                           - Two 4 to 5 mm polyps in the sigmoid colon,                            removed with a cold snare. Resected and retrieved.                           - Diverticulosis in the entire examined colon.                           - Congested and inflamed mucosa in the sigmoid                            colon.  Biopsied.                           - Anal papilla(e) were hypertrophied. Biopsied.                           - Internal hemorrhoids. Moderate Sedation:      Moderate (conscious) sedation was personally administered by an       anesthesia professional. The following parameters were monitored: oxygen       saturation, heart rate, blood pressure, and response to care. Recommendation:           - Patient has a contact number available for  emergencies. The signs and symptoms of potential                            delayed complications were discussed with the                            patient. Return to normal activities tomorrow.                            Written discharge instructions were provided to the                            patient.                           - Resume previous diet.                           - Continue present medications.                           - Await pathology results.                           - Repeat colonoscopy in 3 - 5 years for                            surveillance based on pathology results.                           - Resume Plavix (clopidogrel) at prior dose in 2                            days. Procedure Code(s):        --- Professional ---                           [id number], Colonoscopy, flexible; with removal of                            tumor(s), polyp(s), or other lesion(s) by snare                            technique                           41660, 75, Colonoscopy, flexible; with biopsy,                            single or multiple Diagnosis Code(s):        --- Professional ---                           K64.8, Other hemorrhoids                           K63.89, Other specified diseases of intestine  K63.5, Polyp of colon                           K52.9, Noninfective gastroenteritis and colitis,                            unspecified                           K62.89, Other specified  diseases of anus and rectum                           K62.5, Hemorrhage of anus and rectum                           K57.30, Diverticulosis of large intestine without                            perforation or abscess without bleeding CPT copyright 2019 American Medical Association. All rights reserved. The codes documented in this report are preliminary and upon coder review may  be revised to meet current compliance requirements. Otis Brace, MD Otis Brace, MD 10/24/2019 1:22:56 PM Number of Addenda: 0

## 2019-10-24 NOTE — Anesthesia Procedure Notes (Signed)
Procedure Name: MAC Date/Time: 10/24/2019 12:26 PM Performed by: West Pugh, CRNA Pre-anesthesia Checklist: Patient identified, Emergency Drugs available, Suction available, Patient being monitored and Timeout performed Patient Re-evaluated:Patient Re-evaluated prior to induction Oxygen Delivery Method: Simple face mask Preoxygenation: Pre-oxygenation with 100% oxygen Induction Type: IV induction Placement Confirmation: positive ETCO2 Dental Injury: Teeth and Oropharynx as per pre-operative assessment

## 2019-10-24 NOTE — Op Note (Signed)
[facility] Patient Name: Paula Bass Procedure Date: 10/24/2019 MRN: [id number] Attending MD: Otis Brace , MD Date of Birth: [birth year] CSN: [id number] Age: 49 Admit Type: Outpatient Procedure:                Upper GI endoscopy Indications:              Melena Providers:                Otis Brace, MD, Clyde Lundborg, RN, Fransico Setters                            Mbumina, Technician Referring MD:              Medicines:                Sedation Administered by an Anesthesia Professional Complications:            No immediate complications. Estimated Blood Loss:     Estimated blood loss was minimal. Procedure:                Pre-Anesthesia Assessment:                           - Prior to the procedure, a History and Physical                            was performed, and patient medications and                            allergies were reviewed. The patient's tolerance of                            previous anesthesia was also reviewed. The risks                            and benefits of the procedure and the sedation                            options and risks were discussed with the patient.                            All questions were answered, and informed consent                            was obtained. Prior Anticoagulants: The patient has                            taken Plavix (clopidogrel), last dose was 5 days                            prior to procedure. ASA Grade Assessment: III - A                            patient with severe systemic disease. After  reviewing the risks and benefits, the patient was                            deemed in satisfactory condition to undergo the                            procedure.                           After obtaining informed consent, the endoscope was                            passed under direct vision. Throughout the                            procedure, the patient's blood  pressure, pulse, and                            oxygen saturations were monitored continuously. The                            [id number]-[id number] ([id number]) Olympus gastroscope was                            introduced through the mouth, and advanced to the                            second part of duodenum. The upper GI endoscopy was                            accomplished without difficulty. The patient                            tolerated the procedure well. Scope In: Scope Out: Findings:      Scattered moderate mucosal changes characterized by hemorrhagic       appearance and inflammation were found at the gastroesophageal junction.      Scattered mild inflammation characterized by congestion (edema),       erosions and erythema was found in the gastric antrum and in the       prepyloric region of the stomach. Biopsies were taken with a cold       forceps for histology.      The cardia and gastric fundus were normal on retroflexion.      The duodenal bulb, first portion of the duodenum and second portion of       the duodenum were normal. Impression:               - Hemorrhagic appearing, inflamed mucosa in the                            esophagus.                           - Gastritis. Biopsied.                           -  Normal duodenal bulb, first portion of the                            duodenum and second portion of the duodenum. Moderate Sedation:      Moderate (conscious) sedation was personally administered by an       anesthesia professional. The following parameters were monitored: oxygen       saturation, heart rate, blood pressure, and response to care. Recommendation:           - Patient has a contact number available for                            emergencies. The signs and symptoms of potential                            delayed complications were discussed with the                            patient. Return to normal activities tomorrow.                            Written  discharge instructions were provided to the                            patient.                           - Resume previous diet.                           - Continue present medications.                           - Await pathology results.                           - Use Protonix (pantoprazole) 40 mg PO daily for 2                            months. Procedure Code(s):        --- Professional ---                           [id number], Esophagogastroduodenoscopy, flexible,                            transoral; with biopsy, single or multiple Diagnosis Code(s):        --- Professional ---                           K20.91, Esophagitis, unspecified with bleeding                           K29.70, Gastritis, unspecified, without bleeding                           K92.1, Melena (includes Hematochezia) CPT  copyright 2019 American Medical Association. All rights reserved. The codes documented in this report are preliminary and upon coder review may  be revised to meet current compliance requirements. Otis Brace, MD Otis Brace, MD 10/24/2019 1:17:39 PM Number of Addenda: 0

## 2019-10-24 NOTE — Anesthesia Preprocedure Evaluation (Signed)
Anesthesia Evaluation  Patient identified by MRN, date of birth, ID band Patient awake    Reviewed: Allergy & Precautions, NPO status , Patient's Chart, lab work & pertinent test results, reviewed documented beta blocker date and time   History of Anesthesia Complications Negative for: history of anesthetic complications  Airway Mallampati: II  TM Distance: >3 FB Neck ROM: Full    Dental  (+) Dental Advisory Given, Partial Upper   Pulmonary neg pulmonary ROS,    Pulmonary exam normal        Cardiovascular hypertension, Pt. on home beta blockers and Pt. on medications + CAD and + Cardiac Stents  Normal cardiovascular exam   '20 Myoperfusion - Nuclear stress EF: 66%. No wall motion abnormality There was no ST segment deviation noted during stress. This is a low risk study. No perfusion defects. The study is normal.   '20 TTE - EF 60 to 65%. Mildly increased left ventricular hypertrophy. Mild MR, trivial PR.     Neuro/Psych negative neurological ROS  negative psych ROS   GI/Hepatic Neg liver ROS, GERD  Medicated and Controlled, Elevated AST    Endo/Other  diabetes, Insulin Dependent  Renal/GU ESRF and DialysisRenal disease     Musculoskeletal negative musculoskeletal ROS (+)   Abdominal Normal abdominal exam  (+)   Peds  Hematology  (+) anemia ,  On plavix    Anesthesia Other Findings Lupus  Reproductive/Obstetrics                             Anesthesia Physical  Anesthesia Plan  ASA: III  Anesthesia Plan: MAC   Post-op Pain Management:    Induction: Intravenous  PONV Risk Score and Plan: 3 and Propofol infusion  Airway Management Planned: Natural Airway, Nasal Cannula and Simple Face Mask  Additional Equipment: None  Intra-op Plan:   Post-operative Plan: Extubation in OR  Informed Consent: I have reviewed the patients History and Physical, chart, labs and  discussed the procedure including the risks, benefits and alternatives for the proposed anesthesia with the patient or authorized representative who has indicated his/her understanding and acceptance.       Plan Discussed with: CRNA  Anesthesia Plan Comments:         Anesthesia Quick Evaluation

## 2019-10-26 LAB — POCT I-STAT, CHEM 8
BUN: 11 mg/dL (ref 6–20)
Calcium, Ion: 1.19 mmol/L (ref 1.15–1.40)
Chloride: 95 mmol/L — ABNORMAL LOW (ref 98–111)
Creatinine, Ser: 7.9 mg/dL — ABNORMAL HIGH (ref 0.44–1.00)
Glucose, Bld: 136 mg/dL — ABNORMAL HIGH (ref 70–99)
HCT: 33 % — ABNORMAL LOW (ref 36.0–46.0)
Hemoglobin: 11.2 g/dL — ABNORMAL LOW (ref 12.0–15.0)
Potassium: 3 mmol/L — ABNORMAL LOW (ref 3.5–5.1)
Sodium: 137 mmol/L (ref 135–145)
TCO2: 30 mmol/L (ref 22–32)

## 2019-10-27 ENCOUNTER — Other Ambulatory Visit: Payer: Self-pay

## 2019-10-27 ENCOUNTER — Encounter (HOSPITAL_COMMUNITY): Payer: Self-pay | Admitting: Gastroenterology

## 2019-10-27 LAB — SURGICAL PATHOLOGY

## 2019-10-27 NOTE — Anesthesia Postprocedure Evaluation (Signed)
Anesthesia Post Note  Patient: Paula Bass  Procedure(s) Performed: COLONOSCOPY WITH PROPOFOL (N/A ) ESOPHAGOGASTRODUODENOSCOPY (EGD) WITH PROPOFOL (N/A ) POLYPECTOMY BIOPSY     Patient location during evaluation: Endoscopy Anesthesia Type: MAC Level of consciousness: awake Pain management: pain level controlled Vital Signs Assessment: post-procedure vital signs reviewed and stable Respiratory status: spontaneous breathing Cardiovascular status: stable Postop Assessment: no apparent nausea or vomiting Anesthetic complications: no   No complications documented.  Last Vitals:  Vitals:   10/24/19 1317 10/24/19 1320  BP: 120/61 120/61  Pulse: 84 79  Resp: 16 (!) 29  Temp: 37 C   SpO2: 98% 100%    Last Pain:  Vitals:   10/25/19 1446  TempSrc:   PainSc: 0-No pain                 Huston Foley

## 2019-12-08 ENCOUNTER — Other Ambulatory Visit: Payer: Self-pay

## 2019-12-08 ENCOUNTER — Ambulatory Visit
Admission: RE | Admit: 2019-12-08 | Discharge: 2019-12-08 | Disposition: A | Discharge status: Home / Self Care | Payer: Medicare HMO | Source: Ambulatory Visit | Attending: Advanced Practice Midwife | Admitting: Advanced Practice Midwife

## 2019-12-08 DIAGNOSIS — N6320 Unspecified lump in the left breast, unspecified quadrant: Secondary | ICD-10-CM

## 2019-12-19 ENCOUNTER — Ambulatory Visit (INDEPENDENT_AMBULATORY_CARE_PROVIDER_SITE_OTHER): Payer: Medicare HMO | Admitting: Ophthalmology

## 2019-12-19 ENCOUNTER — Other Ambulatory Visit: Payer: Self-pay

## 2019-12-19 ENCOUNTER — Encounter (INDEPENDENT_AMBULATORY_CARE_PROVIDER_SITE_OTHER): Payer: Self-pay | Admitting: Ophthalmology

## 2019-12-19 DIAGNOSIS — M3219 Other organ or system involvement in systemic lupus erythematosus: Secondary | ICD-10-CM | POA: Diagnosis not present

## 2019-12-19 DIAGNOSIS — H25813 Combined forms of age-related cataract, bilateral: Secondary | ICD-10-CM

## 2019-12-19 DIAGNOSIS — I7789 Other specified disorders of arteries and arterioles: Secondary | ICD-10-CM

## 2019-12-19 DIAGNOSIS — E113393 Type 2 diabetes mellitus with moderate nonproliferative diabetic retinopathy without macular edema, bilateral: Secondary | ICD-10-CM

## 2019-12-19 DIAGNOSIS — H3581 Retinal edema: Secondary | ICD-10-CM

## 2019-12-19 DIAGNOSIS — H4311 Vitreous hemorrhage, right eye: Secondary | ICD-10-CM | POA: Diagnosis not present

## 2019-12-19 DIAGNOSIS — H35033 Hypertensive retinopathy, bilateral: Secondary | ICD-10-CM

## 2019-12-19 DIAGNOSIS — I10 Essential (primary) hypertension: Secondary | ICD-10-CM

## 2019-12-19 MED ORDER — BEVACIZUMAB CHEMO INJECTION 1.25MG/0.05ML SYRINGE FOR KALEIDOSCOPE
1.2500 mg | INTRAVITREAL | Status: AC | PRN
  Administered 2019-12-19: 1.25 mg via INTRAVITREAL

## 2019-12-19 NOTE — Progress Notes (Signed)
[facility] Note  12/19/2019     CHIEF COMPLAINT Patient presents for Retina Evaluation   HISTORY OF PRESENT ILLNESS: Diamon Reddinger is a 49 y.o. female who presents to the clinic today for:   HPI    Retina Evaluation    In both eyes.  This started weeks ago.  Duration of weeks.  Context:  distance vision and near vision.  I, the attending physician,  performed the HPI with the patient and updated documentation appropriately.          Comments    A1c:7.7 BS: 65 Pt states she started seeing floaters and black lines in her vision OD on Tuesday, which are still present.  Patient states OD vision has been poor for several years following lupus diagnosis.  Patient states her left eye is beginning to become blurry.  Patient denies eye pain or discomfort.       Last edited by Bernarda Caffey, MD on 12/19/2019  9:51 AM. (History)    pt is here on the referral of Shirleen Schirmer, Hershal Coria, pt states when she woke up Tuesday morning her vision was blurry, she states she went to dialysis and her BP was very high, she states she started to see "squiggly lines" in her vision, she states the vision has been poor in that eye for several year due to lupus vasculitis, she states she is only able to see certain spots of vision, but right now she cannot even see those, pt states she was dx with lupus in [place], she states they told her her nerve was damaged, pt is also diabetic and hypertensive  Referring physician: [facility] [address] [place],  [place] [address]  HISTORICAL INFORMATION:   Selected notes from the MEDICAL RECORD NUMBER Referred by Shirleen Schirmer, PA-C for DM exam LEE:  Ocular Hx- PMH-    CURRENT MEDICATIONS: Current Outpatient Medications (Ophthalmic Drugs)  Medication Sig  . Polyethyl Glycol-Propyl Glycol (LUBRICANT EYE DROPS) 0.4-0.3 % SOLN Place 1 drop into both eyes 3 (three) times daily as needed  (dry/irritated eyes.).   No current facility-administered medications for this visit. (Ophthalmic Drugs)   Current Outpatient Medications (Other)  Medication Sig  . acetaminophen (TYLENOL) 325 MG tablet Take 2 tablets (650 mg total) by mouth every 4 (four) hours as needed for fever, headache or mild pain. (Patient not taking: Reported on 10/20/2019)  . amLODipine (NORVASC) 10 MG tablet Take 10 mg by mouth daily.  Marland Kitchen ammonium lactate (AMLACTIN) 12 % lotion Apply 1 application topically as needed for dry skin. (Patient not taking: Reported on 09/05/2019)  . aspirin EC 81 MG tablet Take 1 tablet (81 mg total) by mouth daily.  Marland Kitchen atorvastatin (LIPITOR) 40 MG tablet Take 40 mg by mouth daily.   Lorin Picket 1 GM 210 MG(Fe) tablet Take 210-420 tablets by mouth See admin instructions. Take 420 mg by mouth three times a day with meals and 210 mg twice a day with a snack  . clopidogrel (PLAVIX) 75 MG tablet Take 1 tablet (75 mg total) by mouth daily.  . insulin NPH-regular Human (70-30) 100 UNIT/ML injection Inject 30-40 Units into the skin See admin instructions. Inject 40 units in the morning and 30 units at bedtime.   . isosorbide mononitrate (IMDUR) 30 MG 24 hr tablet Take 1 tablet (30 mg total) by mouth daily.  . Lactobacillus (ACIDOPHILUS) CAPS capsule Take 1 capsule by mouth daily.  Marland Kitchen  lidocaine-prilocaine (EMLA) cream Apply 1 application topically Every Tuesday,Thursday,and Saturday with dialysis.   Marland Kitchen metoprolol tartrate (LOPRESSOR) 25 MG tablet Take 1 tablet (25 mg total) by mouth 2 (two) times daily.  . multivitamin (RENA-VIT) TABS tablet Take 1 tablet by mouth daily.  . nitroGLYCERIN (NITROSTAT) 0.4 MG SL tablet Place 1 tablet (0.4 mg total) under the tongue every 5 (five) minutes as needed.  Marland Kitchen oxyCODONE (OXY IR/ROXICODONE) 5 MG immediate release tablet Take 1 tablet (5 mg total) by mouth every 4 (four) hours as needed for moderate pain or severe pain (5mg  for moderate pain, 10mg  for severe pain).  (Patient not taking: Reported on 09/05/2019)  . pantoprazole (PROTONIX) 40 MG tablet Take 1 tablet (40 mg total) by mouth daily. Needs office visit.  . predniSONE (DELTASONE) 5 MG tablet Take 5 mg by mouth daily with breakfast.    No current facility-administered medications for this visit. (Other)      REVIEW OF SYSTEMS: ROS    Positive for: Skin, Endocrine, Eyes   Negative for: Constitutional, Gastrointestinal, Neurological, Genitourinary, Musculoskeletal, HENT, Cardiovascular, Respiratory, Psychiatric, Allergic/Imm, Heme/Lymph   Last edited by Doneen Poisson on 12/19/2019  9:06 AM. (History)       ALLERGIES Allergies  Allergen Reactions  . Penicillins Hives    DID THE REACTION INVOLVE: Swelling of the face/tongue/throat, SOB, or low BP? Yes Sudden or severe rash/hives, skin peeling, or the inside of the mouth or nose? Yes Did it require medical treatment? No When did it last happen?Within the past 10 years If all above answers are "NO", may proceed with cephalosporin use. Ceftriaxone/merrem ok    PAST MEDICAL HISTORY Past Medical History:  Diagnosis Date  . Acute diverticulitis 12/01/2017  . CAD (coronary artery disease) 2016  . CAD (coronary artery disease) 04/19/2018   Hx of PCI to [facility] in [place] in 2017 // [place] 03/2018: dLCx 33, OM1 40, OM2 50, pRCA stent ok, mid RCA 90, EF 55-65, PCI: DES to dLCx  . Diabetes mellitus without complication ([place])   . Dialysis patient ([place])   . Diverticulitis   . GI bleeding   . Hypertension   . LGI bleed 12/16/2017  . Lower GI bleed   . Lupus ([place])   . Renal disorder    dialysis  . UTI (urinary tract infection) 12/01/2017   Past Surgical History:  Procedure Laterality Date  . BIOPSY  10/24/2019   Procedure: BIOPSY;  Surgeon: Otis Brace, MD;  Location: [facility];  Service: Gastroenterology;;  EGD and COLON  . CARDIAC SURGERY     cardiac shunt  . COLONOSCOPY WITH PROPOFOL N/A 10/24/2019   Procedure: COLONOSCOPY WITH  PROPOFOL;  Surgeon: Otis Brace, MD;  Location: [facility];  Service: Gastroenterology;  Laterality: N/A;  . CORONARY STENT INTERVENTION N/A 04/19/2018   Procedure: CORONARY STENT INTERVENTION;  Surgeon: [name], MD;  Location: [facility];  Service: Cardiovascular;  Laterality: N/A;  . ESOPHAGOGASTRODUODENOSCOPY (EGD) WITH PROPOFOL N/A 10/24/2019   Procedure: ESOPHAGOGASTRODUODENOSCOPY (EGD) WITH PROPOFOL;  Surgeon: Otis Brace, MD;  Location: [facility];  Service: Gastroenterology;  Laterality: N/A;  . INCISION AND DRAINAGE PERIRECTAL ABSCESS N/A 07/09/2019   Procedure: IRRIGATION AND DEBRIDEMEN right buttock ABSCESS;  Surgeon: Kieth Brightly, Arta Bruce, MD;  Location: [facility];  Service: General;  Laterality: N/A;  . Kidney graft Left   . LEFT HEART CATH AND CORONARY ANGIOGRAPHY N/A 04/19/2018   Procedure: LEFT HEART CATH AND CORONARY ANGIOGRAPHY;  Surgeon: [name], MD;  Location: [facility];  Service: Cardiovascular;  Laterality: N/A;  . POLYPECTOMY  10/24/2019   Procedure: POLYPECTOMY;  Surgeon: Otis Brace, MD;  Location: [facility];  Service: Gastroenterology;;    FAMILY HISTORY Family History  Problem Relation Age of Onset  . Hypertension Mother   . Other Father        unkown health history  . Healthy Brother   . Healthy Brother   . Colon cancer Neg Hx   . Esophageal cancer Neg Hx   . Breast cancer Neg Hx     SOCIAL HISTORY Social History   Tobacco Use  . Smoking status: Never Smoker  . Smokeless tobacco: Never Used  Vaping Use  . Vaping Use: Never used  Substance Use Topics  . Alcohol use: Never  . Drug use: Never         OPHTHALMIC EXAM:  Base Eye Exam    Visual Acuity (Snellen - Linear)      Right Left   Dist cc CF @ face 20/60 +1   Dist ph cc NI NI   Correction: Glasses       Tonometry (Tonopen, 9:04 AM)      Right Left   Pressure 14 16       Pupils      Dark Light Shape React APD   Right 3 2 Round  Brisk 1+   Left 3 2 Round Brisk 0       Visual Fields      Left Right    Full    Restrictions  Partial outer superior temporal, inferior temporal, superior nasal, inferior nasal deficiencies       Extraocular Movement      Right Left    Full Full       Neuro/Psych    Oriented x3: Yes   Mood/Affect: Normal       Dilation    Both eyes: 1.0% Mydriacyl, 2.5% Phenylephrine @ 9:04 AM        Slit Lamp and Fundus Exam    External Exam      Right Left   External shallow orbits shallow orbits       Slit Lamp Exam      Right Left   Lids/Lashes Meibomian gland dysfunction Meibomian gland dysfunction   Conjunctiva/Sclera White and quiet nasal pinguecula, Melanosis   Cornea arcus, 1+ Punctate epithelial erosions +mucus, 1-2+ Punctate epithelial erosions   Anterior Chamber Deep and quiet Deep and quiet   Iris Round and dilated, No NVI Round and dilated, No NVI   Lens 2+ Nuclear sclerosis, 2+ Cortical cataract, +RBCs on posterior capsule, trace PSC 2+ Nuclear sclerosis, 2+ Cortical cataract, +RBCs on posterior capsule, trace PSC   Vitreous Vitreous syneresis, diffuse VH Vitreous syneresis, diffuse VH       Fundus Exam      Right Left   Disc hazy view, mild Pallor, Sharp rim Pallor, Sharp rim, Compact   C/D Ratio  0.1   Macula hazy view, no details visible Flat, Good foveal reflex, RPE mottling, rare MA   Vessels hazy view, attenuated attenuated, Tortuous   Periphery Hazy view from diffuse VH, superior retina attached, nasal retina with large pre-retinal blood clot, subretinal heme and fibrosis--?macroaneurysm   Attached, mild peripheral fibrosis at 0500           Refraction    Wearing Rx      Sphere Cylinder Axis   Right -5.00 Sphere    Left -4.50 +0.50 025  Manifest Refraction      Sphere Cylinder Axis Dist VA   Right NI +/-3.00      Left -3.50 +0.25 180 20/50+1  No red reflex OD during retinoscopy          IMAGING AND PROCEDURES  Imaging and Procedures  for 12/19/2019  OCT, Retina - OU - Both Eyes       Right Eye Quality was poor. Progression has no prior data. Findings include (Very hazy image, RPE visible, retina grossly attached, scattered fibrosis visible, no IRF/SRF).   Left Eye Quality was good. Central Foveal Thickness: 282. Progression has no prior data. Findings include normal foveal contour, no IRF, no SRF (Irregular lamination).   Notes *Images captured and stored on drive  Diagnosis / Impression:  OD: Very hazy image, RPE visible, retina grossly attached, scattered fibrosis visible, no IRF/SRF OS: NFP; no IRF/SRF; irregular lamination  Clinical management:  See below  Abbreviations: NFP - Normal foveal profile. CME - cystoid macular edema. PED - pigment epithelial detachment. IRF - intraretinal fluid. SRF - subretinal fluid. EZ - ellipsoid zone. ERM - epiretinal membrane. ORA - outer retinal atrophy. ORT - outer retinal tubulation. SRHM - subretinal hyper-reflective material. IRHM - intraretinal hyper-reflective material        Intravitreal Injection, Pharmacologic Agent - OD - Right Eye       Time Out 12/19/2019. 9:51 AM. Confirmed correct patient, procedure, site, and patient consented.   Anesthesia Topical anesthesia was used. Anesthetic medications included Lidocaine 2%, Proparacaine 0.5%.   Procedure Preparation included 5% betadine to ocular surface, eyelid speculum. A supplied needle was used.   Injection:  1.25 mg Bevacizumab (AVASTIN) SOLN   NDC: 60109-323-55, Lot: [id number], Expiration date: 02/13/2020   Route: Intravitreal, Site: Right Eye, Waste: 0.05 mL  Post-op Post injection exam found visual acuity of at least counting fingers. The patient tolerated the procedure well. There were no complications. The patient received written and verbal post procedure care education.                 ASSESSMENT/PLAN:    ICD-10-CM   1. Vitreous hemorrhage of right eye (HCC)  H43.11 Intravitreal  Injection, Pharmacologic Agent - OD - Right Eye    Bevacizumab (AVASTIN) SOLN 1.25 mg  2. 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 Moderate nonproliferative diabetic retinopathy of both eyes without macular edema associated with type 2 diabetes mellitus (HCC)  K27.0623 Intravitreal Injection, Pharmacologic Agent - OD - Right Eye    Bevacizumab (AVASTIN) SOLN 1.25 mg  4. Retinal edema  H35.81 OCT, Retina - OU - Both Eyes  5. Essential hypertension  I10   6. Hypertensive retinopathy of both eyes  H35.033   7. Combined forms of age-related cataract of both eyes  H25.813     1. Vitreous Hemorrhage OD  - diffuse VH, onset Tuesday, 10.19.21  - pt reports BP was elevated at time of onset due to inadvertent stoppage of amlodipine  - pt also reports use of blood thinner for dialysis and history of low vision OD related to lupus vasculitis and ?nerve damage  - discussed findings, risk factors, prognosis and treatment options  - recommend IVA OD #1 today, 10.22.21  - pt wishes to proceed with injection  - RBA of procedure discussed, questions answered  - informed consent obtained, signed and scanned, 10.22.21  - see procedure note  - VH precautions reviewed -- minimize activities, keep head elevated, avoid ASA/NSAIDs/blood thinners as  able  - f/u next Wednesday, October 27  2. History of lupus vasculitis  - pt reports poor vision OD at baseline prior to onset of VH from optic nerve damage and lupus vasculitis  - pt moved to [place] ~1 yr ago from [place] where she was treated and followed by a retina specialist  -monitor--will check FA once VH clears  3,4. Mild nonproliferative diabetic retinopathy w/o DME, both eyes - The incidence, risk factors for progression, natural history and treatment options for diabetic retinopathy were discussed with patient.   - The need for close monitoring of blood glucose, blood pressure, and serum lipids, avoiding cigarette or any type of tobacco, and the need  for long term follow up was also discussed with patient. - exam shows mild MA OS; OD fundus exam obscured by diffuse VH - OCT without diabetic macular edema  - monitor  5,6. Hypertensive retinopathy OU  - pt reports SBP elevated to 160s when VH started due to missing amlodipine doses - discussed importance of tight BP control - monitor  7. Mixed Cataract OU - The symptoms of cataract, surgical options, and treatments and risks were discussed with patient. - discussed diagnosis and progression - monitor   Ophthalmic Meds Ordered this visit:  Meds ordered this encounter  Medications  . Bevacizumab (AVASTIN) SOLN 1.25 mg       Return in about 5 days (around 12/24/2019) for f/u VH OD, DFE, OCT.  There are no Patient Instructions on file for this visit.   Explained the diagnoses, plan, and follow up with the patient and they expressed understanding.  Patient expressed understanding of the importance of proper follow up care.   This document serves as a record of services personally performed by Gardiner Sleeper, MD, PhD. It was created on their behalf by San Jetty. Owens Shark, OA an ophthalmic technician. The creation of this record is the provider's dictation and/or activities during the visit.    Electronically signed by: San Jetty. Owens Shark, [place] 10.22.2021 11:50 AM   Gardiner Sleeper, M.D., Ph.D. Diseases & Surgery of the Retina and Vitreous [facility]  I have reviewed the above documentation for accuracy and completeness, and I agree with the above. Gardiner Sleeper, M.D., Ph.D. 12/19/19 11:50 AM  Abbreviations: M myopia (nearsighted); A astigmatism; H hyperopia (farsighted); P presbyopia; Mrx spectacle prescription;  CTL contact lenses; OD right eye; OS left eye; OU both eyes  XT exotropia; ET esotropia; PEK punctate epithelial keratitis; PEE punctate epithelial erosions; DES dry eye syndrome; MGD meibomian gland dysfunction; ATs artificial tears; PFAT's  preservative free artificial tears; [place] nuclear sclerotic cataract; PSC posterior subcapsular cataract; ERM epi-retinal membrane; PVD posterior vitreous detachment; RD retinal detachment; DM diabetes mellitus; DR diabetic retinopathy; NPDR non-proliferative diabetic retinopathy; PDR proliferative diabetic retinopathy; CSME clinically significant macular edema; DME diabetic macular edema; dbh dot blot hemorrhages; CWS cotton wool spot; POAG primary open angle glaucoma; C/D cup-to-disc ratio; HVF humphrey visual field; GVF goldmann visual field; OCT optical coherence tomography; IOP intraocular pressure; BRVO Branch retinal vein occlusion; CRVO central retinal vein occlusion; CRAO central retinal artery occlusion; BRAO branch retinal artery occlusion; RT retinal tear; SB scleral buckle; PPV pars plana vitrectomy; VH Vitreous hemorrhage; PRP panretinal laser photocoagulation; IVK intravitreal kenalog; VMT vitreomacular traction; MH Macular hole;  NVD neovascularization of the disc; NVE neovascularization elsewhere; AREDS age related eye disease study; ARMD age related macular degeneration; POAG primary open angle glaucoma; EBMD epithelial/anterior basement membrane dystrophy; ACIOL  anterior chamber intraocular lens; IOL intraocular lens; PCIOL posterior chamber intraocular lens; Phaco/IOL phacoemulsification with intraocular lens placement; [place] photorefractive keratectomy; LASIK laser assisted in situ keratomileusis; HTN hypertension; DM diabetes mellitus; COPD chronic obstructive pulmonary disease

## 2019-12-22 NOTE — Progress Notes (Signed)
[facility] Note  12/24/2019     CHIEF COMPLAINT Patient presents for Retina Follow Up   HISTORY OF PRESENT ILLNESS: Paula Bass is a 49 y.o. female who presents to the clinic today for:   HPI    Retina Follow Up    Patient presents with  Other.  In right eye.  This started days ago.  Severity is moderate.  Duration of days.  Since onset it is stable.  I, the attending physician,  performed the HPI with the patient and updated documentation appropriately.          Comments    Pt states vision is the same OU.  Pt denies eye pain or discomfort and denies any new or worsening floaters or fol OU.       Last edited by Bernarda Caffey, MD on 12/24/2019 12:55 PM. (History)    pt states she feels like the vision in her right eye has improved, but she can still see the floaters  Referring physician: Benito Mccreedy, [address],  [place] [address]  HISTORICAL INFORMATION:   Selected notes from the [facility] Referred by Shirleen Schirmer, PA-C for DM exam LEE:  Ocular Hx- PMH-    CURRENT MEDICATIONS: Current Outpatient Medications (Ophthalmic Drugs)  Medication Sig  . Polyethyl Glycol-Propyl Glycol (LUBRICANT EYE DROPS) 0.4-0.3 % SOLN Place 1 drop into both eyes 3 (three) times daily as needed (dry/irritated eyes.).   No current facility-administered medications for this visit. (Ophthalmic Drugs)   Current Outpatient Medications (Other)  Medication Sig  . acetaminophen (TYLENOL) 325 MG tablet Take 2 tablets (650 mg total) by mouth every 4 (four) hours as needed for fever, headache or mild pain. (Patient not taking: Reported on 10/20/2019)  . amLODipine (NORVASC) 10 MG tablet Take 10 mg by mouth daily.  Marland Kitchen ammonium lactate (AMLACTIN) 12 % lotion Apply 1 application topically as needed for dry skin. (Patient not taking: Reported on 09/05/2019)  . aspirin EC 81 MG tablet Take 1 tablet (81 mg total) by mouth  daily.  Marland Kitchen atorvastatin (LIPITOR) 40 MG tablet Take 40 mg by mouth daily.   Lorin Picket 1 GM 210 MG(Fe) tablet Take 210-420 tablets by mouth See admin instructions. Take 420 mg by mouth three times a day with meals and 210 mg twice a day with a snack  . clopidogrel (PLAVIX) 75 MG tablet Take 1 tablet (75 mg total) by mouth daily.  . insulin NPH-regular Human (70-30) 100 UNIT/ML injection Inject 30-40 Units into the skin See admin instructions. Inject 40 units in the morning and 30 units at bedtime.   . isosorbide mononitrate (IMDUR) 30 MG 24 hr tablet Take 1 tablet (30 mg total) by mouth daily.  . Lactobacillus (ACIDOPHILUS) CAPS capsule Take 1 capsule by mouth daily.  Marland Kitchen lidocaine-prilocaine (EMLA) cream Apply 1 application topically Every Tuesday,Thursday,and Saturday with dialysis.   Marland Kitchen metoprolol tartrate (LOPRESSOR) 25 MG tablet Take 1 tablet (25 mg total) by mouth 2 (two) times daily.  . multivitamin (RENA-VIT) TABS tablet Take 1 tablet by mouth daily.  . nitroGLYCERIN (NITROSTAT) 0.4 MG SL tablet Place 1 tablet (0.4 mg total) under the tongue every 5 (five) minutes as needed.  Marland Kitchen oxyCODONE (OXY IR/ROXICODONE) 5 MG immediate release tablet Take 1 tablet (5 mg total) by mouth every 4 (four) hours as needed for moderate pain or severe pain (5mg  for moderate pain, 10mg  for severe pain). (Patient not taking:  Reported on 09/05/2019)  . pantoprazole (PROTONIX) 40 MG tablet Take 1 tablet (40 mg total) by mouth daily. Needs office visit.  . predniSONE (DELTASONE) 5 MG tablet Take 5 mg by mouth daily with breakfast.    No current facility-administered medications for this visit. (Other)      REVIEW OF SYSTEMS: ROS    Positive for: Skin, Endocrine, Eyes   Negative for: Constitutional, Gastrointestinal, Neurological, Genitourinary, Musculoskeletal, HENT, Cardiovascular, Respiratory, Psychiatric, Allergic/Imm, Heme/Lymph   Last edited by Doneen Poisson on 12/24/2019  9:27 AM. (History)        ALLERGIES Allergies  Allergen Reactions  . Penicillins Hives    DID THE REACTION INVOLVE: Swelling of the face/tongue/throat, SOB, or low BP? Yes Sudden or severe rash/hives, skin peeling, or the inside of the mouth or nose? Yes Did it require medical treatment? No When did it last happen?Within the past 10 years If all above answers are "NO", may proceed with cephalosporin use. Ceftriaxone/merrem ok    PAST MEDICAL HISTORY Past Medical History:  Diagnosis Date  . Acute diverticulitis 12/01/2017  . CAD (coronary artery disease) 2016  . CAD (coronary artery disease) 04/19/2018   Hx of PCI to [facility] in [place] in 2017 // [place] 03/2018: dLCx 61, OM1 40, OM2 50, pRCA stent ok, mid RCA 90, EF 55-65, PCI: DES to dLCx  . Diabetes mellitus without complication ([place])   . Dialysis patient ([place])   . Diverticulitis   . GI bleeding   . Hypertension   . LGI bleed 12/16/2017  . Lower GI bleed   . Lupus ([place])   . Renal disorder    dialysis  . UTI (urinary tract infection) 12/01/2017   Past Surgical History:  Procedure Laterality Date  . BIOPSY  10/24/2019   Procedure: BIOPSY;  Surgeon: Otis Brace, MD;  Location: [facility];  Service: Gastroenterology;;  EGD and COLON  . CARDIAC SURGERY     cardiac shunt  . COLONOSCOPY WITH PROPOFOL N/A 10/24/2019   Procedure: COLONOSCOPY WITH PROPOFOL;  Surgeon: Otis Brace, MD;  Location: [facility];  Service: Gastroenterology;  Laterality: N/A;  . CORONARY STENT INTERVENTION N/A 04/19/2018   Procedure: CORONARY STENT INTERVENTION;  Surgeon: [name], MD;  Location: [facility];  Service: Cardiovascular;  Laterality: N/A;  . ESOPHAGOGASTRODUODENOSCOPY (EGD) WITH PROPOFOL N/A 10/24/2019   Procedure: ESOPHAGOGASTRODUODENOSCOPY (EGD) WITH PROPOFOL;  Surgeon: Otis Brace, MD;  Location: [facility];  Service: Gastroenterology;  Laterality: N/A;  . INCISION AND DRAINAGE PERIRECTAL ABSCESS N/A 07/09/2019   Procedure: IRRIGATION  AND DEBRIDEMEN right buttock ABSCESS;  Surgeon: Kieth Brightly, Arta Bruce, MD;  Location: [facility];  Service: General;  Laterality: N/A;  . Kidney graft Left   . LEFT HEART CATH AND CORONARY ANGIOGRAPHY N/A 04/19/2018   Procedure: LEFT HEART CATH AND CORONARY ANGIOGRAPHY;  Surgeon: [name], MD;  Location: [facility];  Service: Cardiovascular;  Laterality: N/A;  . POLYPECTOMY  10/24/2019   Procedure: POLYPECTOMY;  Surgeon: Otis Brace, MD;  Location: [facility];  Service: Gastroenterology;;    FAMILY HISTORY Family History  Problem Relation Age of Onset  . Hypertension Mother   . Other Father        unkown health history  . Healthy Brother   . Healthy Brother   . Colon cancer Neg Hx   . Esophageal cancer Neg Hx   . Breast cancer Neg Hx     SOCIAL HISTORY Social History   Tobacco Use  . Smoking status: Never Smoker  .  Smokeless tobacco: Never Used  Vaping Use  . Vaping Use: Never used  Substance Use Topics  . Alcohol use: Never  . Drug use: Never         OPHTHALMIC EXAM:  Base Eye Exam    Visual Acuity (Snellen - Linear)      Right Left   Dist cc CF @ 3' 20/60 -2   Dist ph cc NI 20/50   Correction: Glasses       Tonometry (Tonopen, 9:31 AM)      Right Left   Pressure 17 14       Pupils      Dark Light Shape React APD   Right 3 2 Round Minimal 0   Left 3 2 Round Minimal 0       Visual Fields      Left Right    Full    Restrictions  Partial outer superior temporal, inferior temporal, superior nasal, inferior nasal deficiencies       Extraocular Movement      Right Left    Full Full       Neuro/Psych    Oriented x3: Yes   Mood/Affect: Normal       Dilation    Both eyes: 1.0% Mydriacyl, 2.5% Phenylephrine @ 9:31 AM        Slit Lamp and Fundus Exam    External Exam      Right Left   External shallow orbits shallow orbits       Slit Lamp Exam      Right Left   Lids/Lashes Dermatochalasis - upper lid Meibomian gland  dysfunction   Conjunctiva/Sclera mild Melanosis nasal pinguecula, Melanosis   Cornea arcus, 2-3+ Punctate epithelial erosions +mucus, 1-2+ Punctate epithelial erosions   Anterior Chamber Deep and quiet Deep and quiet   Iris Round and dilated, No NVI Round and dilated, No NVI   Lens 2+ Nuclear sclerosis, 2+ Cortical cataract, +RBCs on posterior capsule, trace PSC 2+ Nuclear sclerosis, 2+ Cortical cataract, +RBCs on posterior capsule, trace PSC   Vitreous Vitreous syneresis, diffuse RBC/VH -- clearing and settling inferiorly, large blood clots inferiorly Vitreous syneresis, diffuse VH       Fundus Exam      Right Left   Disc 3+Pallor, Sharp rim Pallor, Sharp rim, Compact   C/D Ratio  0.1   Macula Flat, Atrophic, Blunted foveal reflex, no edema Flat, Good foveal reflex, RPE mottling, rare MA   Vessels attenuated, Tortuous attenuated, Tortuous   Periphery Attached, Large preretinal blood clot, sub-retinal heme and fibrosis nasal midzone; inferior periphery obscured by settling VH Attached, mild peripheral fibrosis at 0500           Refraction    Wearing Rx      Sphere Cylinder Axis   Right -5.00 Sphere    Left -4.50 +0.50 025          IMAGING AND PROCEDURES  Imaging and Procedures for 12/24/2019  OCT, Retina - OU - Both Eyes       Right Eye Quality was borderline. Central Foveal Thickness: 209. Progression has improved. Findings include abnormal foveal contour, inner retinal atrophy, vitreous traction, epiretinal membrane, no IRF, no SRF, preretinal fibrosis (Interval improvement in vitreous opacities).   Left Eye Quality was good. Central Foveal Thickness: 288. Progression has been stable. Findings include normal foveal contour, no IRF, no SRF (Irregular lamination).   Notes *Images captured and stored on drive  Diagnosis / Impression:  OD: Interval improvement in  vitreous opacities; central inner retinal atrophy -- old RAO? OS: NFP; no IRF/SRF; irregular  lamination  Clinical management:  See below  Abbreviations: NFP - Normal foveal profile. CME - cystoid macular edema. PED - pigment epithelial detachment. IRF - intraretinal fluid. SRF - subretinal fluid. EZ - ellipsoid zone. ERM - epiretinal membrane. ORA - outer retinal atrophy. ORT - outer retinal tubulation. SRHM - subretinal hyper-reflective material. IRHM - intraretinal hyper-reflective material                 ASSESSMENT/PLAN:    ICD-10-CM   1. Vitreous hemorrhage of right eye (HCC)  H43.11   2. Lupus vasculitis ([place])  M32.19    I77.89   3. Moderate nonproliferative diabetic retinopathy of both eyes without macular edema associated with type 2 diabetes mellitus ([place])  H67.5916   4. Retinal edema  H35.81 OCT, Retina - OU - Both Eyes  5. Essential hypertension  I10   6. Hypertensive retinopathy of both eyes  H35.033   7. Combined forms of age-related cataract of both eyes  H25.813     1. Vitreous Hemorrhage OD  - diffuse VH, onset Tuesday, 10.19.21  - pt reports BP was elevated at time of onset due to inadvertent stoppage of amlodipine  - pt also reports use of blood thinner for dialysis and history of low vision OD related to lupus vasculitis and ?nerve damage  - s/p IVA OD #1 10.22.21  - today, vast improvement in diffuse VH--clearing and settling inferiorly  - BCVA remains CF OD  - OCT shows central inner retinal atrophy -- RAO/retinal vasculitits  - VH precautions reviewed -- minimize activities, keep head elevated, avoid ASA/NSAIDs/blood thinners as able  - f/u 2-3 weeks, DFE, OCT, FA  2. History of lupus vasculitis  - pt reports poor vision OD at baseline prior to onset of VH from optic nerve damage and lupus vasculitis  - pt moved to  ~1 yr ago from [place] where she was treated and followed by a retina specialist  - monitor-will do FA once VH clears to check for NV  3,4. Mild nonproliferative diabetic retinopathy w/o DME, both eyes - exam shows mild MA  OS; OD fundus exam obscured by diffuse VH - OCT without diabetic macular edema  - monitor  5,6. Hypertensive retinopathy OU  - pt reports SBP elevated to 160s when VH started due to missing amlodipine doses - discussed importance of tight BP control - monitor  7. Mixed Cataract OU - The symptoms of cataract, surgical options, and treatments and risks were discussed with patient. - discussed diagnosis and progression - monitor   Ophthalmic Meds Ordered this visit:  No orders of the defined types were placed in this encounter.      Return for f/u 2-3 weeks, VH OS, DFE, OCT, Fluorescein Angiogram.  There are no Patient Instructions on file for this visit.   Explained the diagnoses, plan, and follow up with the patient and they expressed understanding.  Patient expressed understanding of the importance of proper follow up care.   This document serves as a record of services personally performed by Gardiner Sleeper, MD, PhD. It was created on their behalf by Roselee Nova, COMT. The creation of this record is the provider's dictation and/or activities during the visit.  Electronically signed by: Roselee Nova, COMT 12/24/19 1:05 PM  This document serves as a record of services personally performed by Gardiner Sleeper, MD, PhD. It was created on their behalf by  San Jetty. Owens Shark, OA an ophthalmic technician. The creation of this record is the provider's dictation and/or activities during the visit.    Electronically signed by: San Jetty. [place], [place] 10.27.2021 1:05 PM  Gardiner Sleeper, M.D., Ph.D. Diseases & Surgery of the Retina and Vitreous [facility]  I have reviewed the above documentation for accuracy and completeness, and I agree with the above. Gardiner Sleeper, M.D., Ph.D. 12/24/19 1:05 PM  Abbreviations: M myopia (nearsighted); A astigmatism; H hyperopia (farsighted); P presbyopia; Mrx spectacle prescription;  CTL contact lenses; OD right eye; OS left eye;  OU both eyes  XT exotropia; ET esotropia; PEK punctate epithelial keratitis; PEE punctate epithelial erosions; DES dry eye syndrome; MGD meibomian gland dysfunction; ATs artificial tears; PFAT's preservative free artificial tears; [place] nuclear sclerotic cataract; PSC posterior subcapsular cataract; ERM epi-retinal membrane; PVD posterior vitreous detachment; RD retinal detachment; DM diabetes mellitus; DR diabetic retinopathy; NPDR non-proliferative diabetic retinopathy; PDR proliferative diabetic retinopathy; CSME clinically significant macular edema; DME diabetic macular edema; dbh dot blot hemorrhages; CWS cotton wool spot; POAG primary open angle glaucoma; C/D cup-to-disc ratio; HVF humphrey visual field; GVF goldmann visual field; OCT optical coherence tomography; IOP intraocular pressure; BRVO Branch retinal vein occlusion; CRVO central retinal vein occlusion; CRAO central retinal artery occlusion; BRAO branch retinal artery occlusion; RT retinal tear; SB scleral buckle; PPV pars plana vitrectomy; VH Vitreous hemorrhage; PRP panretinal laser photocoagulation; IVK intravitreal kenalog; VMT vitreomacular traction; MH Macular hole;  NVD neovascularization of the disc; NVE neovascularization elsewhere; AREDS age related eye disease study; ARMD age related macular degeneration; POAG primary open angle glaucoma; EBMD epithelial/anterior basement membrane dystrophy; ACIOL anterior chamber intraocular lens; IOL intraocular lens; PCIOL posterior chamber intraocular lens; Phaco/IOL phacoemulsification with intraocular lens placement; [place] photorefractive keratectomy; LASIK laser assisted in situ keratomileusis; HTN hypertension; DM diabetes mellitus; COPD chronic obstructive pulmonary disease

## 2019-12-24 ENCOUNTER — Ambulatory Visit (INDEPENDENT_AMBULATORY_CARE_PROVIDER_SITE_OTHER): Payer: Medicare HMO | Admitting: Ophthalmology

## 2019-12-24 ENCOUNTER — Other Ambulatory Visit: Payer: Self-pay

## 2019-12-24 ENCOUNTER — Encounter (INDEPENDENT_AMBULATORY_CARE_PROVIDER_SITE_OTHER): Payer: Self-pay | Admitting: Ophthalmology

## 2019-12-24 VITALS — BP 153/99

## 2019-12-24 DIAGNOSIS — I7789 Other specified disorders of arteries and arterioles: Secondary | ICD-10-CM

## 2019-12-24 DIAGNOSIS — M3219 Other organ or system involvement in systemic lupus erythematosus: Secondary | ICD-10-CM | POA: Diagnosis not present

## 2019-12-24 DIAGNOSIS — H35033 Hypertensive retinopathy, bilateral: Secondary | ICD-10-CM

## 2019-12-24 DIAGNOSIS — E113393 Type 2 diabetes mellitus with moderate nonproliferative diabetic retinopathy without macular edema, bilateral: Secondary | ICD-10-CM | POA: Diagnosis not present

## 2019-12-24 DIAGNOSIS — H4311 Vitreous hemorrhage, right eye: Secondary | ICD-10-CM

## 2019-12-24 DIAGNOSIS — H3581 Retinal edema: Secondary | ICD-10-CM

## 2019-12-24 DIAGNOSIS — H25813 Combined forms of age-related cataract, bilateral: Secondary | ICD-10-CM

## 2019-12-24 DIAGNOSIS — I10 Essential (primary) hypertension: Secondary | ICD-10-CM

## 2019-12-25 ENCOUNTER — Other Ambulatory Visit: Payer: Self-pay | Admitting: Internal Medicine

## 2019-12-25 DIAGNOSIS — R5381 Other malaise: Secondary | ICD-10-CM

## 2019-12-26 ENCOUNTER — Other Ambulatory Visit: Payer: Self-pay | Admitting: Internal Medicine

## 2019-12-26 DIAGNOSIS — Z7952 Long term (current) use of systemic steroids: Secondary | ICD-10-CM

## 2020-01-01 ENCOUNTER — Other Ambulatory Visit: Payer: Self-pay

## 2020-01-01 MED ORDER — ISOSORBIDE MONONITRATE ER 30 MG PO TB24
30.0000 mg | ORAL_TABLET | Freq: Every day | ORAL | 0 refills | Status: DC
Start: 2020-01-01 — End: 2020-02-09

## 2020-01-01 NOTE — Telephone Encounter (Signed)
Pt's medication was sent to pt's pharmacy as requested. Confirmation received.  °

## 2020-01-02 ENCOUNTER — Encounter (INDEPENDENT_AMBULATORY_CARE_PROVIDER_SITE_OTHER): Payer: Medicare HMO | Admitting: Ophthalmology

## 2020-01-07 ENCOUNTER — Encounter (INDEPENDENT_AMBULATORY_CARE_PROVIDER_SITE_OTHER): Payer: Medicare HMO | Admitting: Ophthalmology

## 2020-01-07 DIAGNOSIS — H35033 Hypertensive retinopathy, bilateral: Secondary | ICD-10-CM

## 2020-01-07 DIAGNOSIS — H3581 Retinal edema: Secondary | ICD-10-CM

## 2020-01-07 DIAGNOSIS — E113393 Type 2 diabetes mellitus with moderate nonproliferative diabetic retinopathy without macular edema, bilateral: Secondary | ICD-10-CM

## 2020-01-07 DIAGNOSIS — I7789 Other specified disorders of arteries and arterioles: Secondary | ICD-10-CM

## 2020-01-07 DIAGNOSIS — H25813 Combined forms of age-related cataract, bilateral: Secondary | ICD-10-CM

## 2020-01-07 DIAGNOSIS — I10 Essential (primary) hypertension: Secondary | ICD-10-CM

## 2020-01-07 DIAGNOSIS — H4311 Vitreous hemorrhage, right eye: Secondary | ICD-10-CM

## 2020-01-27 ENCOUNTER — Other Ambulatory Visit: Payer: Self-pay | Admitting: Cardiovascular Disease

## 2020-02-09 ENCOUNTER — Encounter: Payer: Self-pay | Admitting: Physician Assistant

## 2020-02-09 ENCOUNTER — Other Ambulatory Visit: Payer: Self-pay

## 2020-02-09 ENCOUNTER — Ambulatory Visit (INDEPENDENT_AMBULATORY_CARE_PROVIDER_SITE_OTHER): Payer: Medicare HMO | Admitting: Physician Assistant

## 2020-02-09 VITALS — BP 124/78 | HR 84 | Ht 61.0 in | Wt 142.4 lb

## 2020-02-09 DIAGNOSIS — I2510 Atherosclerotic heart disease of native coronary artery without angina pectoris: Secondary | ICD-10-CM

## 2020-02-09 DIAGNOSIS — Z992 Dependence on renal dialysis: Secondary | ICD-10-CM

## 2020-02-09 DIAGNOSIS — Z01810 Encounter for preprocedural cardiovascular examination: Secondary | ICD-10-CM

## 2020-02-09 DIAGNOSIS — E785 Hyperlipidemia, unspecified: Secondary | ICD-10-CM

## 2020-02-09 DIAGNOSIS — N186 End stage renal disease: Secondary | ICD-10-CM

## 2020-02-09 DIAGNOSIS — I25119 Atherosclerotic heart disease of native coronary artery with unspecified angina pectoris: Secondary | ICD-10-CM

## 2020-02-09 DIAGNOSIS — I10 Essential (primary) hypertension: Secondary | ICD-10-CM

## 2020-02-09 DIAGNOSIS — I120 Hypertensive chronic kidney disease with stage 5 chronic kidney disease or end stage renal disease: Secondary | ICD-10-CM

## 2020-02-09 MED ORDER — ISOSORBIDE MONONITRATE ER 60 MG PO TB24
60.0000 mg | ORAL_TABLET | Freq: Every day | ORAL | 3 refills | Status: DC
Start: 2020-02-09 — End: 2020-03-05

## 2020-02-09 NOTE — H&P (View-Only) (Signed)
Cardiology Office Note:    Date:  02/09/2020   ID:  Paula Bass, DOB [birth year], MRN [id number]  PCP:  Benito Mccreedy, MD  [facility] Cardiologist:  Mertie Moores, MD  [facility] Electrophysiologist:  None   Chief Complaint: kidney transplant   History of Present Illness:    Paula Bass is a 49 y.o. female with a hx of CAD, ESRD on HD, lupus, HTN, DM, HLD and prior GI bleed seen for kidney transplant clearance.   Hx of CAD with PCI to [facility] in 2017 @ [place].  Last cath 03/2018 showed patent pRCA stent. 80-90% mid/distal LCx s/p synergy DES. She had 90% mRCA stenosis which treated medically and will need orbital atherectomy for refractory angina.  Her procedure was c/b a large R groin hematoma.  Her hemoglobin dropped and she required PRBCs x 2.  CT was neg for retroperitoneal hematoma.      Normal Stress test 01/2019.   Patient is here for kidney transplant (at [facility]) clearance. No formal request yet. Had EGD and colonoscopy 09/2019. Hold Plavix without any issue. She reports 3-4 months hx of intermittent substernal chest pressure associated with SOB. initially with exercise, now with rest. Resolves spontaneously in 30 minutes or so. Never tried SL nitro. Sometime feels like burning with nausea. She takes daily Protonix.    Past Medical History:  Diagnosis Date   Acute diverticulitis 12/01/2017   CAD (coronary artery disease) 2016   CAD (coronary artery disease) 04/19/2018   Hx of PCI to [facility] in [place] in 2017 // [place] 03/2018: dLCx 43, OM1 40, OM2 50, pRCA stent ok, mid RCA 90, EF 55-65, PCI: DES to dLCx   Diabetes mellitus without complication ([place])    Dialysis patient ([place])    Diverticulitis    GI bleeding    Hypertension    LGI bleed 12/16/2017   Lower GI bleed    Lupus ([place])    Renal disorder    dialysis   UTI (urinary tract infection) 12/01/2017    Past Surgical History:  Procedure Laterality Date   BIOPSY  10/24/2019   Procedure: BIOPSY;  Surgeon:  Otis Brace, MD;  Location: [facility];  Service: Gastroenterology;;  EGD and COLON   CARDIAC SURGERY     cardiac shunt   COLONOSCOPY WITH PROPOFOL N/A 10/24/2019   Procedure: COLONOSCOPY WITH PROPOFOL;  Surgeon: Otis Brace, MD;  Location: [facility];  Service: Gastroenterology;  Laterality: N/A;   CORONARY STENT INTERVENTION N/A 04/19/2018   Procedure: CORONARY STENT INTERVENTION;  Surgeon: [name], MD;  Location: [place] CV [facility];  Service: Cardiovascular;  Laterality: N/A;   ESOPHAGOGASTRODUODENOSCOPY (EGD) WITH PROPOFOL N/A 10/24/2019   Procedure: ESOPHAGOGASTRODUODENOSCOPY (EGD) WITH PROPOFOL;  Surgeon: Otis Brace, MD;  Location: [facility];  Service: Gastroenterology;  Laterality: N/A;   INCISION AND DRAINAGE PERIRECTAL ABSCESS N/A 07/09/2019   Procedure: IRRIGATION AND DEBRIDEMEN right buttock ABSCESS;  Surgeon: Kieth Brightly, Arta Bruce, MD;  Location: [place];  Service: General;  Laterality: N/A;   Kidney graft Left    LEFT HEART CATH AND CORONARY ANGIOGRAPHY N/A 04/19/2018   Procedure: LEFT HEART CATH AND CORONARY ANGIOGRAPHY;  Surgeon: [name], MD;  Location: [place] CV LAB;  Service: Cardiovascular;  Laterality: N/A;   POLYPECTOMY  10/24/2019   Procedure: POLYPECTOMY;  Surgeon: Otis Brace, MD;  Location: [facility];  Service: Gastroenterology;;    Current Medications: Current Meds  Medication Sig   acetaminophen (TYLENOL) 325 MG tablet Take 2 tablets (650 mg total)  by mouth every 4 (four) hours as needed for fever, headache or mild pain.   amLODipine (NORVASC) 10 MG tablet Take 10 mg by mouth daily.   ammonium lactate (AMLACTIN) 12 % lotion Apply 1 application topically as needed for dry skin.   aspirin EC 81 MG tablet Take 1 tablet (81 mg total) by mouth daily.   atorvastatin (LIPITOR) 40 MG tablet Take 40 mg by mouth daily.    AURYXIA 1 GM 210 MG(Fe) tablet Take 210-420 tablets by mouth See admin instructions. Take 420 mg by mouth  three times a day with meals and 210 mg twice a day with a snack   clopidogrel (PLAVIX) 75 MG tablet Take 1 tablet (75 mg total) by mouth daily.   insulin NPH-regular Human (70-30) 100 UNIT/ML injection Inject 30-40 Units into the skin See admin instructions. Inject 40 units in the morning and 30 units at bedtime.   Lactobacillus (ACIDOPHILUS) CAPS capsule Take 1 capsule by mouth daily.   lidocaine-prilocaine (EMLA) cream Apply 1 application topically Every Tuesday,Thursday,and Saturday with dialysis.    metoprolol tartrate (LOPRESSOR) 25 MG tablet Take 1 tablet (25 mg total) by mouth 2 (two) times daily.   multivitamin (RENA-VIT) TABS tablet Take 1 tablet by mouth daily.   nitroGLYCERIN (NITROSTAT) 0.4 MG SL tablet Place 1 tablet (0.4 mg total) under the tongue every 5 (five) minutes as needed.   oxyCODONE (OXY IR/ROXICODONE) 5 MG immediate release tablet Take 1 tablet (5 mg total) by mouth every 4 (four) hours as needed for moderate pain or severe pain (5mg  for moderate pain, 10mg  for severe pain).   pantoprazole (PROTONIX) 40 MG tablet Take 1 tablet (40 mg total) by mouth daily. Needs office visit.   Polyethyl Glycol-Propyl Glycol 0.4-0.3 % SOLN Place 1 drop into both eyes 3 (three) times daily as needed (dry/irritated eyes.).   predniSONE (DELTASONE) 5 MG tablet Take 5 mg by mouth daily with breakfast.   [DISCONTINUED] isosorbide mononitrate (IMDUR) 30 MG 24 hr tablet Take 1 tablet (30 mg total) by mouth daily. Please make yearly appt with Dr. Acie Fredrickson for December for future refills. Thank you 1st attempt     Allergies:   Penicillins   Social History   Socioeconomic History   Marital status: Single    Spouse name: Not on file   Number of children: 1   Years of education: Not on file   Highest education level: Not on file  Occupational History   Occupation: disabled  Tobacco Use   Smoking status: Never Smoker   Smokeless tobacco: Never Used  Scientific laboratory technician Use: Never used   Substance and Sexual Activity   Alcohol use: Never   Drug use: Never   Sexual activity: Yes    Partners: Male    Birth control/protection: Condom  Other Topics Concern   Not on file  Social History Narrative   Not on file   Social Determinants of Health   Financial Resource Strain: Not on file  Food Insecurity: Not on file  Transportation Needs: Not on file  Physical Activity: Not on file  Stress: Not on file  Social Connections: Not on file     Family History: The patient's family history includes Healthy in her brother and brother; Hypertension in her mother; Other in her father. There is no history of Colon cancer, Esophageal cancer, or Breast cancer.    ROS:   Please see the history of present illness.    All other systems reviewed  and are negative.   EKGs/Labs/Other Studies Reviewed:    The following studies were reviewed today:  Stress test 01/2019 Nuclear stress EF: 66%. No wall motion abnormality There was no ST segment deviation noted during stress. This is a low risk study. No perfusion defects. The study is normal.    CORONARY STENT INTERVENTION  03/2018  LEFT HEART CATH AND CORONARY ANGIOGRAPHY    Conclusion  Conclusions: Significant two-vessel CAD, with 80-90% mid/distal LCx and 90% mid RCA stenoses. Moderate, non-obstructive LAD and proximal LCx/OM disease. Patent stent in proximal RCA. Normal left ventricular systolic function and filling pressure. Successful PCI to mid/distal LCx using Synergy 2.25 x 20 mm DES with 0% residual stenosis and TIMI-3 flow.   Recommendations: Overnight extended recovery; anticipate d/c home tomorrow AM with HD at outpatient center tomorrow PM. Continue dual antiplatelet therapy with aspirin and clopidogrel for at least 12 months, ideally longer. Medical therapy of LAD and RCA disease.  If she has recurrent CP, PCI to mid RCA with orbital atherectomy will need to be considered.   Diagnostic Dominance:  Right    Intervention      EKG:  EKG is  ordered today.  The ekg ordered today demonstrates NSR at rate of 77 bpm  Recent Labs: 07/06/2019: ALT 35 07/11/2019: Platelets 265 10/24/2019: BUN 11; Creatinine, Ser 7.90; Hemoglobin 11.2; Potassium 3.0; Sodium 137  Recent Lipid Panel    Component Value Date/Time   CHOL 120 04/15/2018 0948   TRIG 92 04/15/2018 0948   HDL 59 04/15/2018 0948   CHOLHDL 2.0 04/15/2018 0948   LDLCALC 43 04/15/2018 0948    Physical Exam:    VS:  BP 124/78   Pulse 84   Ht 5\' 1"  (1.549 m)   Wt 142 lb 6.4 oz (64.6 kg)   LMP 02/16/2018 (Approximate)   SpO2 97%   BMI 26.91 kg/m     Wt Readings from Last 3 Encounters:  02/09/20 142 lb 6.4 oz (64.6 kg)  10/24/19 130 lb (59 kg)  07/11/19 158 lb 15.2 oz (72.1 kg)     GEN: Well nourished, well developed in no acute distress HEENT: Normal NECK: No JVD; No carotid bruits LYMPHATICS: No lymphadenopathy CARDIAC: RRR, no murmurs, rubs, gallops RESPIRATORY:  Clear to auscultation without rales, wheezing or rhonchi  ABDOMEN: Soft, non-tender, non-distended MUSCULOSKELETAL:  No edema; No deformity  SKIN: Warm and dry NEUROLOGIC:  Alert and oriented x 3 PSYCHIATRIC:  Normal affect   ASSESSMENT AND PLAN:    Unstable angina with hx of CAD - She reports 3-4 months hx of progressive worsening substernal chest pressure with shortness of breath and nausea. Symptoms similar to prior angina. Never tried SL nitro. Now she is requiring surgical clearance for kidney transplant. She has  90% mRCA disease which is treated medically in 2020 and recommended orbital atherectomy for refractory angina.  Patient was hesitant for cardiac catheterization given prior history of large hematoma after cath both times. - Dr. Acie Fredrickson and I have reviewed extensively with the patient and  recommended cath with PCI prior to clearance and postpone her surgery.  Likely she will need a closure device to prevent hematoma. - Increase Imdur to  60mg  qd. Try SL nitro.  - Continue dual antiplatelet therapy with aspirin and Plavix -Likely patient needs admission post cath and dialysis next day  2.Surgical clearance for kidney transplant - As above   3. HTN -Stable and controlled on current medication  4. HLD -Continue statin No results found for  requested labs within last 8760 hours.   5. ESRD on HD (TTh and Sat)   Shared Decision Making/Informed Consent   Shared Decision Making/Informed Consent The risks [stroke (1 in 1000), death (1 in 1000), kidney failure [usually temporary] (1 in 500), bleeding (1 in 200), allergic reaction [possibly serious] (1 in 200)], benefits (diagnostic support and management of coronary artery disease) and alternatives of a cardiac catheterization were discussed in detail with Mr. Luana Shu and he is willing to proceed.     Medication Adjustments/Labs and Tests Ordered: Current medicines are reviewed at length with the patient today.  Concerns regarding medicines are outlined above.  Orders Placed This Encounter  Procedures   CBC   Basic metabolic panel   EKG [id number]   Meds ordered this encounter  Medications   isosorbide mononitrate (IMDUR) 60 MG 24 hr tablet    Sig: Take 1 tablet (60 mg total) by mouth daily.    Dispense:  90 tablet    Refill:  3    Patient Instructions  Medication Instructions:  1. Increase the isosorbide mononitrate (Imdur) to 60 mg by mouth daily.  *If you need a refill on your cardiac medications before your next appointment, please call your pharmacy*   Lab Work: CBC and BMET today.  If you have labs (blood work) drawn today and your tests are completely normal, you will receive your results only by: [place] (if you have MyChart) OR A paper copy in the mail If you have any lab test that is abnormal or we need to change your treatment, we will call you to review the results.   Testing/Procedures: Your physician has requested that you have a cardiac  catheterization. Cardiac catheterization is used to diagnose and/or treat various heart conditions. Doctors may recommend this procedure for a number of different reasons. The most common reason is to evaluate chest pain. Chest pain can be a symptom of coronary artery disease (CAD), and cardiac catheterization can show whether plaque is narrowing or blocking your heart's arteries. This procedure is also used to evaluate the valves, as well as measure the blood flow and oxygen levels in different parts of your heart. For further information please visit [email or web address].     Follow-Up: At [facility], you and your health needs are our priority.  As part of our continuing mission to provide you with exceptional heart care, we have created designated Provider Care Teams.  These Care Teams include your primary Cardiologist (physician) and Advanced Practice Providers (APPs -  Physician Assistants and Nurse Practitioners) who all work together to provide you with the care you need, when you need it.    Your next appointment:  You will follow up on 03/04/2020 at 9:20 AM.     [place] OFFICE [place], [place] [address] Dept: [place]: [place]  02/09/2020  You are scheduled for a Cardiac Catheterization on Friday, December 17 with Dr. Harrell Gave End.  1. Please arrive at the [facility] (Main Entrance A) at [facility]: [place], [place] [address] at 10:00 AM (This time is two hours before your procedure to ensure your preparation). Free valet parking service is available.   Special note: Every effort is made to have your procedure done on time. Please understand that emergencies sometimes delay scheduled procedures.  2. Diet: Do not eat solid foods after midnight.  The patient may  have clear liquids until 5am upon the day of the  procedure.  3. Labs: BMET and CBC today Due to recent COVID-19 restrictions implemented by our local and state authorities and in an effort to keep both patients and staff as safe as possible, our hospital system requires COVID-19 testing prior to certain scheduled hospital procedures.  Please go to [place]. [place], [place] [address] on 02/11/2020 at 12:00 PM  .  This is a drive up testing site.  You will not need to exit your vehicle.  You will not be billed at the time of testing but may receive a bill later depending on your insurance. You must agree to self-quarantine from the time of your testing until the procedure date on 02/13/2020.  This should included staying home with ONLY the people you live with.  Avoid take-out, grocery store shopping or leaving the house for any non-emergent reason.  Failure to have your COVID-19 test done on the date and time you have been scheduled will result in cancellation of your procedure.  Please call our office at [phone] if you have any questions.   4. Medication instructions in preparation for your procedure:   Contrast Allergy: No  Take only one half dose of your insulin the night before your procedure and none the morning of.   On the morning of your procedure be sure to take your Aspirin and Plavix.  5. Plan for one night stay--bring personal belongings. 6. Bring a current list of your medications and current insurance cards. 7. You MUST have a responsible person to drive you home. 8. Someone MUST be with you the first 24 hours after you arrive home or your discharge will be delayed. 9. Please wear clothes that are easy to get on and off and wear slip-on shoes.  Thank you for allowing [place] to care for you!   -- [facility] Invasive Cardiovascular services      Signed, Leanor Kail, [place]  02/09/2020 4:49 PM    [place]   Attending Note:   The patient was seen and examined.  Agree with assessment  and plan as noted above.  Changes made to the above note as needed.  Patient seen and independently examined with Robbie Lis, PA .   We discussed all aspects of the encounter. I agree with the assessment and plan as stated above.    CAD :  I agree with plan for cath.  It will be impossible to clear her for her kidney transplant without a cardiac cath Risks, benefits, options were discussed with her . She understands and agrees to proceed.     I have spent a total of 40 minutes with patient reviewing hospital  notes , telemetry, EKGs, labs and examining patient as well as establishing an assessment and plan that was discussed with the patient.  > 50% of time was spent in direct patient care.    Thayer Headings, Brooke Bonito., MD, [facility] 02/12/2020, 10:54 AM [address]  [place] Pager [phone]

## 2020-02-09 NOTE — Patient Instructions (Addendum)
Medication Instructions:  1. Increase the isosorbide mononitrate (Imdur) to 60 mg by mouth daily.  *If you need a refill on your cardiac medications before your next appointment, please call your pharmacy*   Lab Work: CBC and BMET today.  If you have labs (blood work) drawn today and your tests are completely normal, you will receive your results only by: [place] [facility] MyChart Message (if you have MyChart) OR . A paper copy in the mail If you have any lab test that is abnormal or we need to change your treatment, we will call you to review the results.   Testing/Procedures: Your physician has requested that you have a cardiac catheterization. Cardiac catheterization is used to diagnose and/or treat various heart conditions. Doctors may recommend this procedure for a number of different reasons. The most common reason is to evaluate chest pain. Chest pain can be a symptom of coronary artery disease (CAD), and cardiac catheterization can show whether plaque is narrowing or blocking your heart's arteries. This procedure is also used to evaluate the valves, as well as measure the blood flow and oxygen levels in different parts of your heart. For further information please visit [email or web address].     Follow-Up: At [facility], you and your health needs are our priority.  As part of our continuing mission to provide you with exceptional heart care, we have created designated Provider Care Teams.  These Care Teams include your primary Cardiologist (physician) and Advanced Practice Providers (APPs -  Physician Assistants and Nurse Practitioners) who all work together to provide you with the care you need, when you need it.    Your next appointment:  You will follow up on 03/04/2020 at 9:20 AM.     [place] OFFICE [place], [place] [address] Dept: [place]: [place]  02/09/2020  You are scheduled for a Cardiac Catheterization on Friday, December 17 with Dr. Harrell Gave End.  1. Please arrive at the [facility] (Main Entrance A) at [facility]: [place], [place] [address] at 10:00 AM (This time is two hours before your procedure to ensure your preparation). Free valet parking service is available.   Special note: Every effort is made to have your procedure done on time. Please understand that emergencies sometimes delay scheduled procedures.  2. Diet: Do not eat solid foods after midnight.  The patient may have clear liquids until 5am upon the day of the procedure.  3. Labs: BMET and CBC today Due to recent COVID-19 restrictions implemented by our local and state authorities and in an effort to keep both patients and staff as safe as possible, our hospital system requires COVID-19 testing prior to certain scheduled hospital procedures.  Please go to [place]. [place], [place] [address] on 02/11/2020 at 12:00 PM  .  This is a drive up testing site.  You will not need to exit your vehicle.  You will not be billed at the time of testing but may receive a bill later depending on your insurance. You must agree to self-quarantine from the time of your testing until the procedure date on 02/13/2020.  This should included staying home with ONLY the people you live with.  Avoid take-out, grocery store shopping or leaving the house for any non-emergent reason.  Failure to have your COVID-19 test done on the date and time you have been scheduled will result in  cancellation of your procedure.  Please call our office at [phone] if you have any questions.   4. Medication instructions in preparation for your procedure:   Contrast Allergy: No  Take only one half dose of your insulin the night before your procedure and none the morning of.   On the morning of your procedure be sure to take your Aspirin and Plavix.  5. Plan for one  night stay--bring personal belongings. 6. Bring a current list of your medications and current insurance cards. 7. You MUST have a responsible person to drive you home. 8. Someone MUST be with you the first 24 hours after you arrive home or your discharge will be delayed. 9. Please wear clothes that are easy to get on and off and wear slip-on shoes.  Thank you for allowing [place] to care for you!   -- [place] Invasive Cardiovascular services

## 2020-02-09 NOTE — Progress Notes (Addendum)
Cardiology Office Note:    Date:  02/09/2020   ID:  Lidia Collum [place], DOB [birth year], MRN [id number]  PCP:  Benito Mccreedy, MD  [facility] Cardiologist:  Mertie Moores, MD  [facility] Electrophysiologist:  None   Chief Complaint: kidney transplant   History of Present Illness:    Naida Escalante is a 49 y.o. female with a hx of CAD, ESRD on HD, lupus, HTN, DM, HLD and prior GI bleed seen for kidney transplant clearance.   Hx of CAD with PCI to [facility] in 2017 @ [place].  Last cath 03/2018 showed patent pRCA stent. 80-90% mid/distal LCx s/p synergy DES. She had 90% mRCA stenosis which treated medically and will need orbital atherectomy for refractory angina.  Her procedure was c/b a large R groin hematoma.  Her hemoglobin dropped and she required PRBCs x 2.  CT was neg for retroperitoneal hematoma.      Normal Stress test 01/2019.   Patient is here for kidney transplant (at [facility]) clearance. No formal request yet. Had EGD and colonoscopy 09/2019. Hold Plavix without any issue. She reports 3-4 months hx of intermittent substernal chest pressure associated with SOB. initially with exercise, now with rest. Resolves spontaneously in 30 minutes or so. Never tried SL nitro. Sometime feels like burning with nausea. She takes daily Protonix.    Past Medical History:  Diagnosis Date   Acute diverticulitis 12/01/2017   CAD (coronary artery disease) 2016   CAD (coronary artery disease) 04/19/2018   Hx of PCI to [facility] in [place] in 2017 // [place] 03/2018: dLCx 66, OM1 40, OM2 50, pRCA stent ok, mid RCA 90, EF 55-65, PCI: DES to dLCx   Diabetes mellitus without complication ([place])    Dialysis patient ([place])    Diverticulitis    GI bleeding    Hypertension    LGI bleed 12/16/2017   Lower GI bleed    Lupus ([place])    Renal disorder    dialysis   UTI (urinary tract infection) 12/01/2017    Past Surgical History:  Procedure Laterality Date   BIOPSY  10/24/2019   Procedure: BIOPSY;  Surgeon:  Otis Brace, MD;  Location: [facility];  Service: Gastroenterology;;  EGD and COLON   CARDIAC SURGERY     cardiac shunt   COLONOSCOPY WITH PROPOFOL N/A 10/24/2019   Procedure: COLONOSCOPY WITH PROPOFOL;  Surgeon: Otis Brace, MD;  Location: [facility];  Service: Gastroenterology;  Laterality: N/A;   CORONARY STENT INTERVENTION N/A 04/19/2018   Procedure: CORONARY STENT INTERVENTION;  Surgeon: [name], MD;  Location: [facility];  Service: Cardiovascular;  Laterality: N/A;   ESOPHAGOGASTRODUODENOSCOPY (EGD) WITH PROPOFOL N/A 10/24/2019   Procedure: ESOPHAGOGASTRODUODENOSCOPY (EGD) WITH PROPOFOL;  Surgeon: Otis Brace, MD;  Location: [facility];  Service: Gastroenterology;  Laterality: N/A;   INCISION AND DRAINAGE PERIRECTAL ABSCESS N/A 07/09/2019   Procedure: IRRIGATION AND DEBRIDEMEN right buttock ABSCESS;  Surgeon: Kieth Brightly, Arta Bruce, MD;  Location: [facility];  Service: General;  Laterality: N/A;   Kidney graft Left    LEFT HEART CATH AND CORONARY ANGIOGRAPHY N/A 04/19/2018   Procedure: LEFT HEART CATH AND CORONARY ANGIOGRAPHY;  Surgeon: [name], MD;  Location: [facility] CV LAB;  Service: Cardiovascular;  Laterality: N/A;   POLYPECTOMY  10/24/2019   Procedure: POLYPECTOMY;  Surgeon: Otis Brace, MD;  Location: [facility];  Service: Gastroenterology;;    Current Medications: Current Meds  Medication Sig   acetaminophen (TYLENOL) 325 MG tablet Take 2 tablets (650 mg total)  by mouth every 4 (four) hours as needed for fever, headache or mild pain.   amLODipine (NORVASC) 10 MG tablet Take 10 mg by mouth daily.   ammonium lactate (AMLACTIN) 12 % lotion Apply 1 application topically as needed for dry skin.   aspirin EC 81 MG tablet Take 1 tablet (81 mg total) by mouth daily.   atorvastatin (LIPITOR) 40 MG tablet Take 40 mg by mouth daily.    AURYXIA 1 GM 210 MG(Fe) tablet Take 210-420 tablets by mouth See admin instructions. Take 420 mg by mouth  three times a day with meals and 210 mg twice a day with a snack   clopidogrel (PLAVIX) 75 MG tablet Take 1 tablet (75 mg total) by mouth daily.   insulin NPH-regular Human (70-30) 100 UNIT/ML injection Inject 30-40 Units into the skin See admin instructions. Inject 40 units in the morning and 30 units at bedtime.   Lactobacillus (ACIDOPHILUS) CAPS capsule Take 1 capsule by mouth daily.   lidocaine-prilocaine (EMLA) cream Apply 1 application topically Every Tuesday,Thursday,and Saturday with dialysis.    metoprolol tartrate (LOPRESSOR) 25 MG tablet Take 1 tablet (25 mg total) by mouth 2 (two) times daily.   multivitamin (RENA-VIT) TABS tablet Take 1 tablet by mouth daily.   nitroGLYCERIN (NITROSTAT) 0.4 MG SL tablet Place 1 tablet (0.4 mg total) under the tongue every 5 (five) minutes as needed.   oxyCODONE (OXY IR/ROXICODONE) 5 MG immediate release tablet Take 1 tablet (5 mg total) by mouth every 4 (four) hours as needed for moderate pain or severe pain (5mg  for moderate pain, 10mg  for severe pain).   pantoprazole (PROTONIX) 40 MG tablet Take 1 tablet (40 mg total) by mouth daily. Needs office visit.   Polyethyl Glycol-Propyl Glycol 0.4-0.3 % SOLN Place 1 drop into both eyes 3 (three) times daily as needed (dry/irritated eyes.).   predniSONE (DELTASONE) 5 MG tablet Take 5 mg by mouth daily with breakfast.   [DISCONTINUED] isosorbide mononitrate (IMDUR) 30 MG 24 hr tablet Take 1 tablet (30 mg total) by mouth daily. Please make yearly appt with Dr. Acie Fredrickson for December for future refills. Thank you 1st attempt     Allergies:   Penicillins   Social History   Socioeconomic History   Marital status: Single    Spouse name: Not on file   Number of children: 1   Years of education: Not on file   Highest education level: Not on file  Occupational History   Occupation: disabled  Tobacco Use   Smoking status: Never Smoker   Smokeless tobacco: Never Used  Scientific laboratory technician Use: Never used   Substance and Sexual Activity   Alcohol use: Never   Drug use: Never   Sexual activity: Yes    Partners: Male    Birth control/protection: Condom  Other Topics Concern   Not on file  Social History Narrative   Not on file   Social Determinants of Health   Financial Resource Strain: Not on file  Food Insecurity: Not on file  Transportation Needs: Not on file  Physical Activity: Not on file  Stress: Not on file  Social Connections: Not on file     Family History: The patient's family history includes Healthy in her brother and brother; Hypertension in her mother; Other in her father. There is no history of Colon cancer, Esophageal cancer, or Breast cancer.    ROS:   Please see the history of present illness.    All other systems reviewed  and are negative.   EKGs/Labs/Other Studies Reviewed:    The following studies were reviewed today:  Stress test 01/2019 Nuclear stress EF: 66%. No wall motion abnormality There was no ST segment deviation noted during stress. This is a low risk study. No perfusion defects. The study is normal.    CORONARY STENT INTERVENTION  03/2018  LEFT HEART CATH AND CORONARY ANGIOGRAPHY    Conclusion  Conclusions: Significant two-vessel CAD, with 80-90% mid/distal LCx and 90% mid RCA stenoses. Moderate, non-obstructive LAD and proximal LCx/OM disease. Patent stent in proximal RCA. Normal left ventricular systolic function and filling pressure. Successful PCI to mid/distal LCx using Synergy 2.25 x 20 mm DES with 0% residual stenosis and TIMI-3 flow.   Recommendations: Overnight extended recovery; anticipate d/c home tomorrow AM with HD at outpatient center tomorrow PM. Continue dual antiplatelet therapy with aspirin and clopidogrel for at least 12 months, ideally longer. Medical therapy of LAD and RCA disease.  If she has recurrent CP, PCI to mid RCA with orbital atherectomy will need to be considered.   Diagnostic Dominance:  Right    Intervention      EKG:  EKG is  ordered today.  The ekg ordered today demonstrates NSR at rate of 77 bpm  Recent Labs: 07/06/2019: ALT 35 07/11/2019: Platelets 265 10/24/2019: BUN 11; Creatinine, Ser 7.90; Hemoglobin 11.2; Potassium 3.0; Sodium 137  Recent Lipid Panel    Component Value Date/Time   CHOL 120 04/15/2018 0948   TRIG 92 04/15/2018 0948   HDL 59 04/15/2018 0948   CHOLHDL 2.0 04/15/2018 0948   LDLCALC 43 04/15/2018 0948    Physical Exam:    VS:  BP 124/78   Pulse 84   Ht 5\' 1"  (1.549 m)   Wt 142 lb 6.4 oz (64.6 kg)   LMP 02/16/2018 (Approximate)   SpO2 97%   BMI 26.91 kg/m     Wt Readings from Last 3 Encounters:  02/09/20 142 lb 6.4 oz (64.6 kg)  10/24/19 130 lb (59 kg)  07/11/19 158 lb 15.2 oz (72.1 kg)     GEN: Well nourished, well developed in no acute distress HEENT: Normal NECK: No JVD; No carotid bruits LYMPHATICS: No lymphadenopathy CARDIAC: RRR, no murmurs, rubs, gallops RESPIRATORY:  Clear to auscultation without rales, wheezing or rhonchi  ABDOMEN: Soft, non-tender, non-distended MUSCULOSKELETAL:  No edema; No deformity  SKIN: Warm and dry NEUROLOGIC:  Alert and oriented x 3 PSYCHIATRIC:  Normal affect   ASSESSMENT AND PLAN:    Unstable angina with hx of CAD - She reports 3-4 months hx of progressive worsening substernal chest pressure with shortness of breath and nausea. Symptoms similar to prior angina. Never tried SL nitro. Now she is requiring surgical clearance for kidney transplant. She has  90% mRCA disease which is treated medically in 2020 and recommended orbital atherectomy for refractory angina.  Patient was hesitant for cardiac catheterization given prior history of large hematoma after cath both times. - Dr. Acie Fredrickson and I have reviewed extensively with the patient and  recommended cath with PCI prior to clearance and postpone her surgery.  Likely she will need a closure device to prevent hematoma. - Increase Imdur to  60mg  qd. Try SL nitro.  - Continue dual antiplatelet therapy with aspirin and Plavix -Likely patient needs admission post cath and dialysis next day  2.Surgical clearance for kidney transplant - As above   3. HTN -Stable and controlled on current medication  4. HLD -Continue statin No results found for  requested labs within last 8760 hours.   5. ESRD on HD (TTh and Sat)   Shared Decision Making/Informed Consent   Shared Decision Making/Informed Consent The risks [stroke (1 in 1000), death (1 in 1000), kidney failure [usually temporary] (1 in 500), bleeding (1 in 200), allergic reaction [possibly serious] (1 in 200)], benefits (diagnostic support and management of coronary artery disease) and alternatives of a cardiac catheterization were discussed in detail with Mr. Luana Shu and he is willing to proceed.     Medication Adjustments/Labs and Tests Ordered: Current medicines are reviewed at length with the patient today.  Concerns regarding medicines are outlined above.  Orders Placed This Encounter  Procedures   CBC   Basic metabolic panel   EKG 38-VANV   Meds ordered this encounter  Medications   isosorbide mononitrate (IMDUR) 60 MG 24 hr tablet    Sig: Take 1 tablet (60 mg total) by mouth daily.    Dispense:  90 tablet    Refill:  3    Patient Instructions  Medication Instructions:  1. Increase the isosorbide mononitrate (Imdur) to 60 mg by mouth daily.  *If you need a refill on your cardiac medications before your next appointment, please call your pharmacy*   Lab Work: CBC and BMET today.  If you have labs (blood work) drawn today and your tests are completely normal, you will receive your results only by: [place] (if you have MyChart) OR A paper copy in the mail If you have any lab test that is abnormal or we need to change your treatment, we will call you to review the results.   Testing/Procedures: Your physician has requested that you have a cardiac  catheterization. Cardiac catheterization is used to diagnose and/or treat various heart conditions. Doctors may recommend this procedure for a number of different reasons. The most common reason is to evaluate chest pain. Chest pain can be a symptom of coronary artery disease (CAD), and cardiac catheterization can show whether plaque is narrowing or blocking your heart's arteries. This procedure is also used to evaluate the valves, as well as measure the blood flow and oxygen levels in different parts of your heart. For further information please visit [email or web address].     Follow-Up: At [facility], you and your health needs are our priority.  As part of our continuing mission to provide you with exceptional heart care, we have created designated Provider Care Teams.  These Care Teams include your primary Cardiologist (physician) and Advanced Practice Providers (APPs -  Physician Assistants and Nurse Practitioners) who all work together to provide you with the care you need, when you need it.    Your next appointment:  You will follow up on 03/04/2020 at 9:20 AM.     [facility] OFFICE [place], [place] [address] Dept: [place]: [place]  02/09/2020  You are scheduled for a Cardiac Catheterization on Friday, December 17 with Dr. Harrell Gave End.  1. Please arrive at the [facility] (Main Entrance A) at [facility]: [place], [place] [address] at 10:00 AM (This time is two hours before your procedure to ensure your preparation). Free valet parking service is available.   Special note: Every effort is made to have your procedure done on time. Please understand that emergencies sometimes delay scheduled procedures.  2. Diet: Do not eat solid foods after midnight.  The patient may  have clear liquids until 5am upon the day of the  procedure.  3. Labs: BMET and CBC today Due to recent COVID-19 restrictions implemented by our local and state authorities and in an effort to keep both patients and staff as safe as possible, our hospital system requires COVID-19 testing prior to certain scheduled hospital procedures.  Please go to [facility]. [place], [place] [address] on 02/11/2020 at 12:00 PM  .  This is a drive up testing site.  You will not need to exit your vehicle.  You will not be billed at the time of testing but may receive a bill later depending on your insurance. You must agree to self-quarantine from the time of your testing until the procedure date on 02/13/2020.  This should included staying home with ONLY the people you live with.  Avoid take-out, grocery store shopping or leaving the house for any non-emergent reason.  Failure to have your COVID-19 test done on the date and time you have been scheduled will result in cancellation of your procedure.  Please call our office at [phone] if you have any questions.   4. Medication instructions in preparation for your procedure:   Contrast Allergy: No  Take only one half dose of your insulin the night before your procedure and none the morning of.   On the morning of your procedure be sure to take your Aspirin and Plavix.  5. Plan for one night stay--bring personal belongings. 6. Bring a current list of your medications and current insurance cards. 7. You MUST have a responsible person to drive you home. 8. Someone MUST be with you the first 24 hours after you arrive home or your discharge will be delayed. 9. Please wear clothes that are easy to get on and off and wear slip-on shoes.  Thank you for allowing [place] to care for you!   -- [facility] Invasive Cardiovascular services      Signed, Leanor Kail, [place]  02/09/2020 4:49 PM    [place]   Attending Note:   The patient was seen and examined.  Agree with assessment  and plan as noted above.  Changes made to the above note as needed.  Patient seen and independently examined with Robbie Lis, PA .   We discussed all aspects of the encounter. I agree with the assessment and plan as stated above.    CAD :  I agree with plan for cath.  It will be impossible to clear her for her kidney transplant without a cardiac cath Risks, benefits, options were discussed with her . She understands and agrees to proceed.     I have spent a total of 40 minutes with patient reviewing hospital  notes , telemetry, EKGs, labs and examining patient as well as establishing an assessment and plan that was discussed with the patient.  > 50% of time was spent in direct patient care.    Thayer Headings, Brooke Bonito., MD, [facility] 02/12/2020, 10:54 AM [address]  [place] Pager [phone]

## 2020-02-10 LAB — CBC
Hematocrit: 31.4 % — ABNORMAL LOW (ref 34.0–46.6)
Hemoglobin: 10.9 g/dL — ABNORMAL LOW (ref 11.1–15.9)
MCH: 31.1 pg (ref 26.6–33.0)
MCHC: 34.7 g/dL (ref 31.5–35.7)
MCV: 90 fL (ref 79–97)
Platelets: 179 10*3/uL (ref 150–450)
RBC: 3.5 x10E6/uL — ABNORMAL LOW (ref 3.77–5.28)
RDW: 14.9 % (ref 11.7–15.4)
WBC: 7 10*3/uL (ref 3.4–10.8)

## 2020-02-10 LAB — BASIC METABOLIC PANEL
BUN/Creatinine Ratio: 4 — ABNORMAL LOW (ref 9–23)
BUN: 45 mg/dL — ABNORMAL HIGH (ref 6–24)
CO2: 27 mmol/L (ref 20–29)
Calcium: 9.8 mg/dL (ref 8.7–10.2)
Chloride: 98 mmol/L (ref 96–106)
Creatinine, Ser: 10.23 mg/dL — ABNORMAL HIGH (ref 0.57–1.00)
GFR calc Af Amer: 5 mL/min/{1.73_m2} — ABNORMAL LOW (ref >59)
GFR calc non Af Amer: 4 mL/min/{1.73_m2} — ABNORMAL LOW (ref >59)
Glucose: 103 mg/dL — ABNORMAL HIGH (ref 65–99)
Potassium: 4.5 mmol/L (ref 3.5–5.2)
Sodium: 141 mmol/L (ref 134–144)

## 2020-02-11 ENCOUNTER — Other Ambulatory Visit (HOSPITAL_COMMUNITY)
Admission: RE | Admit: 2020-02-11 | Discharge: 2020-02-11 | Disposition: A | Discharge status: Home / Self Care | Payer: Medicare HMO | Source: Ambulatory Visit | Attending: Internal Medicine | Admitting: Internal Medicine

## 2020-02-11 DIAGNOSIS — Z01812 Encounter for preprocedural laboratory examination: Secondary | ICD-10-CM | POA: Diagnosis present

## 2020-02-11 DIAGNOSIS — Z20822 Contact with and (suspected) exposure to covid-19: Secondary | ICD-10-CM | POA: Diagnosis not present

## 2020-02-11 LAB — SARS CORONAVIRUS 2 (TAT 6-24 HRS): SARS Coronavirus 2: NEGATIVE

## 2020-02-12 ENCOUNTER — Telehealth: Payer: Self-pay | Admitting: *Deleted

## 2020-02-12 NOTE — Telephone Encounter (Signed)
Pt contacted pre-coronary atherectomy scheduled at [facility] for: Friday February 13, 2020 12 Noon Verified arrival time and place: [facility]) at: 10 AM   No solid food after midnight prior to cath, clear liquids until 5 AM day of procedure.  Hold: Insulin-AM of procedure/1/2 usual Insulin HS prior to procedure  Except hold medications AM meds can be  taken pre-cath with sips of water including: ASA 81 mg Plavix 75 mg   Confirmed patient has responsible adult to drive home post procedure and be with patient first 24 hours after arriving home: yes  You are allowed ONE visitor in the waiting room during the time you are at the hospital for your procedure. Both you and your visitor must wear a mask once you enter the hospital.       COVID-19 Pre-Screening Questions:  . In the past 14 days have you had any symptoms concerning for COVID-19 infection (fever, chills, cough, or new shortness of breath)? no . In the past 14 days have you been around anyone with known Covid 19? No  Reviewed procedure/mask/visitor instructions, COVID-19 questions with patient.

## 2020-02-13 ENCOUNTER — Encounter (HOSPITAL_COMMUNITY)
Admission: RE | Disposition: A | Discharge status: Home / Self Care | Payer: Self-pay | Source: Home / Self Care | Attending: Internal Medicine

## 2020-02-13 ENCOUNTER — Other Ambulatory Visit: Payer: Self-pay

## 2020-02-13 ENCOUNTER — Observation Stay (HOSPITAL_COMMUNITY)
Admission: RE | Admit: 2020-02-13 | Discharge: 2020-02-15 | Disposition: A | Discharge status: Home / Self Care | Payer: Medicare HMO | Attending: Cardiology | Admitting: Cardiology

## 2020-02-13 DIAGNOSIS — I200 Unstable angina: Secondary | ICD-10-CM

## 2020-02-13 DIAGNOSIS — I2510 Atherosclerotic heart disease of native coronary artery without angina pectoris: Principal | ICD-10-CM | POA: Insufficient documentation

## 2020-02-13 DIAGNOSIS — Z79899 Other long term (current) drug therapy: Secondary | ICD-10-CM | POA: Diagnosis not present

## 2020-02-13 DIAGNOSIS — Z7901 Long term (current) use of anticoagulants: Secondary | ICD-10-CM | POA: Insufficient documentation

## 2020-02-13 DIAGNOSIS — Z01818 Encounter for other preprocedural examination: Secondary | ICD-10-CM | POA: Diagnosis present

## 2020-02-13 DIAGNOSIS — I25110 Atherosclerotic heart disease of native coronary artery with unstable angina pectoris: Secondary | ICD-10-CM

## 2020-02-13 DIAGNOSIS — Z992 Dependence on renal dialysis: Secondary | ICD-10-CM | POA: Insufficient documentation

## 2020-02-13 DIAGNOSIS — Z7982 Long term (current) use of aspirin: Secondary | ICD-10-CM | POA: Diagnosis not present

## 2020-02-13 DIAGNOSIS — I120 Hypertensive chronic kidney disease with stage 5 chronic kidney disease or end stage renal disease: Secondary | ICD-10-CM | POA: Diagnosis not present

## 2020-02-13 DIAGNOSIS — T148XXA Other injury of unspecified body region, initial encounter: Secondary | ICD-10-CM

## 2020-02-13 DIAGNOSIS — E785 Hyperlipidemia, unspecified: Secondary | ICD-10-CM | POA: Diagnosis present

## 2020-02-13 DIAGNOSIS — Z20822 Contact with and (suspected) exposure to covid-19: Secondary | ICD-10-CM | POA: Diagnosis not present

## 2020-02-13 DIAGNOSIS — E119 Type 2 diabetes mellitus without complications: Secondary | ICD-10-CM | POA: Diagnosis not present

## 2020-02-13 DIAGNOSIS — N186 End stage renal disease: Secondary | ICD-10-CM | POA: Insufficient documentation

## 2020-02-13 DIAGNOSIS — R079 Chest pain, unspecified: Secondary | ICD-10-CM | POA: Diagnosis present

## 2020-02-13 HISTORY — PX: LEFT HEART CATH AND CORONARY ANGIOGRAPHY: CATH118249

## 2020-02-13 HISTORY — PX: CORONARY ATHERECTOMY: CATH118238

## 2020-02-13 HISTORY — PX: CORONARY STENT INTERVENTION: CATH118234

## 2020-02-13 HISTORY — PX: CORONARY PRESSURE/FFR STUDY: CATH118243

## 2020-02-13 LAB — GLUCOSE, CAPILLARY
Glucose-Capillary: 65 mg/dL — ABNORMAL LOW (ref 70–99)
Glucose-Capillary: 127 mg/dL — ABNORMAL HIGH (ref 70–99)
Glucose-Capillary: 127 mg/dL — ABNORMAL HIGH (ref 70–99)

## 2020-02-13 LAB — POCT ACTIVATED CLOTTING TIME
Activated Clotting Time: 291 seconds
Activated Clotting Time: 321 seconds
Activated Clotting Time: 297 seconds

## 2020-02-13 SURGERY — CORONARY ATHERECTOMY
Anesthesia: LOCAL

## 2020-02-13 MED ORDER — ONDANSETRON HCL 4 MG/2ML IJ SOLN: 4.0000 mg | Freq: Four times a day (QID) | INTRAMUSCULAR | Status: DC | PRN

## 2020-02-13 MED ORDER — ADENOSINE 12 MG/4ML IV SOLN
INTRAVENOUS | Status: AC
  Filled 2020-02-13: qty 16

## 2020-02-13 MED ORDER — NITROGLYCERIN 0.4 MG SL SUBL: 0.4000 mg | SUBLINGUAL_TABLET | SUBLINGUAL | Status: DC | PRN

## 2020-02-13 MED ORDER — CLOPIDOGREL BISULFATE 75 MG PO TABS
75.0000 mg | ORAL_TABLET | Freq: Every day | ORAL | Status: DC
  Administered 2020-02-14 – 2020-02-15 (×2): 75 mg via ORAL
  Filled 2020-02-13 (×2): qty 1

## 2020-02-13 MED ORDER — LABETALOL HCL 5 MG/ML IV SOLN: 10.0000 mg | INTRAVENOUS | Status: AC | PRN

## 2020-02-13 MED ORDER — HEPARIN SODIUM (PORCINE) 5000 UNIT/ML IJ SOLN
5000.0000 [IU] | Freq: Three times a day (TID) | INTRAMUSCULAR | Status: DC
  Administered 2020-02-14 – 2020-02-15 (×3): 5000 [IU] via SUBCUTANEOUS
  Filled 2020-02-13 (×3): qty 1

## 2020-02-13 MED ORDER — NITROGLYCERIN 1 MG/10 ML FOR IR/CATH LAB
INTRA_ARTERIAL | Status: AC
  Filled 2020-02-13: qty 10

## 2020-02-13 MED ORDER — MIDAZOLAM HCL 2 MG/2ML IJ SOLN
INTRAMUSCULAR | Status: AC
  Filled 2020-02-13: qty 2

## 2020-02-13 MED ORDER — ATORVASTATIN CALCIUM 40 MG PO TABS
40.0000 mg | ORAL_TABLET | Freq: Every day | ORAL | Status: DC
  Administered 2020-02-13 – 2020-02-14 (×2): 40 mg via ORAL
  Filled 2020-02-13 (×2): qty 1

## 2020-02-13 MED ORDER — DEXTROSE 50 % IV SOLN
INTRAVENOUS | Status: AC
  Administered 2020-02-13: 25 mL via INTRAVENOUS
  Filled 2020-02-13: qty 50

## 2020-02-13 MED ORDER — ADENOSINE (DIAGNOSTIC) 140MCG/KG/MIN
INTRAVENOUS | Status: DC | PRN
  Administered 2020-02-13: 140 ug/kg/min via INTRAVENOUS

## 2020-02-13 MED ORDER — SODIUM CHLORIDE 0.9% FLUSH: 3.0000 mL | INTRAVENOUS | Status: DC | PRN

## 2020-02-13 MED ORDER — ASPIRIN EC 81 MG PO TBEC
81.0000 mg | DELAYED_RELEASE_TABLET | Freq: Every day | ORAL | Status: DC
  Administered 2020-02-14 – 2020-02-15 (×2): 81 mg via ORAL
  Filled 2020-02-13 (×2): qty 1

## 2020-02-13 MED ORDER — ISOSORBIDE MONONITRATE ER 30 MG PO TB24
30.0000 mg | ORAL_TABLET | Freq: Every day | ORAL | Status: DC
  Administered 2020-02-14 – 2020-02-15 (×2): 30 mg via ORAL
  Filled 2020-02-13 (×2): qty 1

## 2020-02-13 MED ORDER — SODIUM CHLORIDE 0.9% FLUSH
3.0000 mL | Freq: Two times a day (BID) | INTRAVENOUS | Status: DC
  Administered 2020-02-13 – 2020-02-14 (×3): 3 mL via INTRAVENOUS

## 2020-02-13 MED ORDER — LIDOCAINE HCL (PF) 1 % IJ SOLN
INTRAMUSCULAR | Status: DC | PRN
  Administered 2020-02-13: 2 mL

## 2020-02-13 MED ORDER — HEPARIN (PORCINE) IN NACL 1000-0.9 UT/500ML-% IV SOLN
INTRAVENOUS | Status: AC
  Filled 2020-02-13: qty 500

## 2020-02-13 MED ORDER — LIDOCAINE HCL (PF) 1 % IJ SOLN
INTRAMUSCULAR | Status: AC
  Filled 2020-02-13: qty 30

## 2020-02-13 MED ORDER — ASPIRIN 81 MG PO CHEW: 81.0000 mg | CHEWABLE_TABLET | ORAL | Status: DC

## 2020-02-13 MED ORDER — SODIUM CHLORIDE 0.9 % IV SOLN: INTRAVENOUS | Status: DC

## 2020-02-13 MED ORDER — FENTANYL CITRATE (PF) 100 MCG/2ML IJ SOLN
INTRAMUSCULAR | Status: DC | PRN
  Administered 2020-02-13 (×3): 12.5 ug via INTRAVENOUS

## 2020-02-13 MED ORDER — IOHEXOL 350 MG/ML SOLN
INTRAVENOUS | Status: DC | PRN
  Administered 2020-02-13: 190 mL

## 2020-02-13 MED ORDER — HEPARIN SODIUM (PORCINE) 1000 UNIT/ML IJ SOLN
INTRAMUSCULAR | Status: DC | PRN
  Administered 2020-02-13: 3000 [IU] via INTRAVENOUS
  Administered 2020-02-13: 4000 [IU] via INTRAVENOUS
  Administered 2020-02-13 (×2): 2000 [IU] via INTRAVENOUS

## 2020-02-13 MED ORDER — CLOPIDOGREL BISULFATE 300 MG PO TABS
ORAL_TABLET | ORAL | Status: AC
  Filled 2020-02-13: qty 1

## 2020-02-13 MED ORDER — MIDAZOLAM HCL 2 MG/2ML IJ SOLN
INTRAMUSCULAR | Status: DC | PRN
  Administered 2020-02-13 (×3): 0.5 mg via INTRAVENOUS

## 2020-02-13 MED ORDER — PANTOPRAZOLE SODIUM 40 MG PO TBEC
40.0000 mg | DELAYED_RELEASE_TABLET | Freq: Every day | ORAL | Status: DC
  Administered 2020-02-14 – 2020-02-15 (×2): 40 mg via ORAL
  Filled 2020-02-13 (×2): qty 1

## 2020-02-13 MED ORDER — HEPARIN SODIUM (PORCINE) 1000 UNIT/ML IJ SOLN
INTRAMUSCULAR | Status: AC
  Filled 2020-02-13: qty 1

## 2020-02-13 MED ORDER — METOPROLOL TARTRATE 25 MG PO TABS
25.0000 mg | ORAL_TABLET | Freq: Two times a day (BID) | ORAL | Status: DC
  Administered 2020-02-13 – 2020-02-15 (×4): 25 mg via ORAL
  Filled 2020-02-13 (×4): qty 1

## 2020-02-13 MED ORDER — ACETAMINOPHEN 325 MG PO TABS
650.0000 mg | ORAL_TABLET | ORAL | Status: DC | PRN
  Administered 2020-02-13: 650 mg via ORAL
  Filled 2020-02-13: qty 2

## 2020-02-13 MED ORDER — INSULIN ASPART PROT & ASPART (70-30 MIX) 100 UNIT/ML ~~LOC~~ SUSP
30.0000 [IU] | Freq: Two times a day (BID) | SUBCUTANEOUS | Status: DC
  Administered 2020-02-14: 30 [IU] via SUBCUTANEOUS
  Filled 2020-02-13: qty 10

## 2020-02-13 MED ORDER — VERAPAMIL HCL 2.5 MG/ML IV SOLN
INTRAVENOUS | Status: DC | PRN
  Administered 2020-02-13: 10 mL via INTRA_ARTERIAL

## 2020-02-13 MED ORDER — PREDNISONE 5 MG PO TABS
5.0000 mg | ORAL_TABLET | Freq: Every day | ORAL | Status: DC
  Administered 2020-02-14 – 2020-02-15 (×2): 5 mg via ORAL
  Filled 2020-02-13 (×2): qty 1

## 2020-02-13 MED ORDER — HEPARIN (PORCINE) IN NACL 1000-0.9 UT/500ML-% IV SOLN
INTRAVENOUS | Status: DC | PRN
  Administered 2020-02-13 (×2): 500 mL

## 2020-02-13 MED ORDER — CLOPIDOGREL BISULFATE 300 MG PO TABS
ORAL_TABLET | ORAL | Status: DC | PRN
  Administered 2020-02-13: 300 mg via ORAL

## 2020-02-13 MED ORDER — DEXTROSE 50 % IV SOLN: 25.0000 mL | Freq: Once | INTRAVENOUS | Status: AC

## 2020-02-13 MED ORDER — SODIUM CHLORIDE 0.9 % IV SOLN: 250.0000 mL | INTRAVENOUS | Status: DC | PRN

## 2020-02-13 MED ORDER — NITROGLYCERIN 1 MG/10 ML FOR IR/CATH LAB
INTRA_ARTERIAL | Status: DC | PRN
  Administered 2020-02-13 (×7): 200 ug via INTRACORONARY

## 2020-02-13 MED ORDER — RENA-VITE PO TABS
1.0000 | ORAL_TABLET | Freq: Every day | ORAL | Status: DC
  Administered 2020-02-14: 1 via ORAL
  Filled 2020-02-13: qty 1

## 2020-02-13 MED ORDER — FENTANYL CITRATE (PF) 100 MCG/2ML IJ SOLN
INTRAMUSCULAR | Status: AC
  Filled 2020-02-13: qty 2

## 2020-02-13 MED ORDER — IOHEXOL 350 MG/ML SOLN
INTRAVENOUS | Status: AC
  Filled 2020-02-13: qty 1

## 2020-02-13 MED ORDER — HYDRALAZINE HCL 20 MG/ML IJ SOLN: 10.0000 mg | INTRAMUSCULAR | Status: AC | PRN

## 2020-02-13 MED ORDER — VERAPAMIL HCL 2.5 MG/ML IV SOLN
INTRAVENOUS | Status: AC
  Filled 2020-02-13: qty 2

## 2020-02-13 MED ORDER — AMLODIPINE BESYLATE 10 MG PO TABS
10.0000 mg | ORAL_TABLET | Freq: Every day | ORAL | Status: DC
  Administered 2020-02-13 – 2020-02-15 (×3): 10 mg via ORAL
  Filled 2020-02-13 (×3): qty 1

## 2020-02-13 SURGICAL SUPPLY — 33 items
BALLN EMERGE MR 2.75X12 (BALLOONS) ×2
BALLN SAPPHIRE 2.0X10 (BALLOONS) ×2
BALLN SAPPHIRE ~~LOC~~ 3.25X18 (BALLOONS) ×2 IMPLANT
BALLN ~~LOC~~ EMERGE MR 2.5X6 (BALLOONS) ×2
BALLOON EMERGE MR 2.75X12 (BALLOONS) ×1 IMPLANT
BALLOON SAPPHIRE 2.0X10 (BALLOONS) ×1 IMPLANT
BALLOON ~~LOC~~ EMERGE MR 2.5X6 (BALLOONS) ×1 IMPLANT
CATH LAUNCHER 5F EBU3.5 (CATHETERS) ×2 IMPLANT
CATH LAUNCHER 6FR EBU3.5 (CATHETERS) ×2 IMPLANT
CATH OPTITORQUE TIG 4.0 5F (CATHETERS) ×2 IMPLANT
CATH TELEPORT (CATHETERS) ×2 IMPLANT
CROWN DIAMONDBACK CLASSIC 1.25 (BURR) ×2 IMPLANT
DEVICE RAD COMP TR BAND LRG (VASCULAR PRODUCTS) ×2 IMPLANT
ELECT DEFIB PAD ADLT CADENCE (PAD) ×2 IMPLANT
GLIDESHEATH SLEND SS 6F .021 (SHEATH) ×2 IMPLANT
GUIDEWIRE INQWIRE 1.5J.035X260 (WIRE) ×1 IMPLANT
GUIDEWIRE PRESSURE COMET II (WIRE) ×2 IMPLANT
INQWIRE 1.5J .035X260CM (WIRE) ×2
KIT ENCORE 26 ADVANTAGE (KITS) ×2 IMPLANT
KIT HEART LEFT (KITS) ×2 IMPLANT
LUBRICANT VIPERSLIDE CORONARY (MISCELLANEOUS) ×2 IMPLANT
PACK CARDIAC CATHETERIZATION (CUSTOM PROCEDURE TRAY) ×2 IMPLANT
SHEATH GLIDE SLENDER 4/5FR (SHEATH) ×2 IMPLANT
SHEATH PINNACLE 6F 10CM (SHEATH) ×2 IMPLANT
SHEATH PROBE COVER 6X72 (BAG) ×2 IMPLANT
STENT RESOLUTE ONYX 2.25X8 (Permanent Stent) ×2 IMPLANT
STENT RESOLUTE ONYX 3.0X26 (Permanent Stent) ×2 IMPLANT
TRANSDUCER W/STOPCOCK (MISCELLANEOUS) ×2 IMPLANT
TUBING CIL FLEX 10 FLL-RA (TUBING) ×2 IMPLANT
WIRE ASAHI FIELDER XT 300CM (WIRE) ×2 IMPLANT
WIRE HI TORQ BMW 190CM (WIRE) ×2 IMPLANT
WIRE RUNTHROUGH .014X180CM (WIRE) ×2 IMPLANT
WIRE VIPERWIRE COR FLEX .012 (WIRE) ×2 IMPLANT

## 2020-02-13 NOTE — Progress Notes (Signed)
   Patient gets her dialysis care at [facility] location by Dr. Vanita Panda. She typically has HD T/Th/Sat at 7:30am. Spoke with Dr. Posey Pronto who was able to arrange for her to get her HD at her usual facility for 2nd shift. Message sent to weekend team to prioritize her evaluation in the morning for hopeful early discharge.   Abigail Butts, PA-C 02/13/20; 5:37 PM

## 2020-02-13 NOTE — Interval H&P Note (Signed)
History and Physical Interval Note:  02/13/2020 2:01 PM  Paula Bass  has presented today for surgery, with the diagnosis of accelerating angina.  The various methods of treatment have been discussed with the patient and family. After consideration of risks, benefits and other options for treatment, the patient has consented to  Procedure(s): CORONARY ATHERECTOMY (N/A) as a surgical intervention.  The patient's history has been reviewed, patient examined, no change in status, stable for surgery.  I have reviewed the patient's chart and labs.  Questions were answered to the patient's satisfaction.    Cath Lab Visit (complete for each Cath Lab visit)  Clinical Evaluation Leading to the Procedure:   ACS: No.  Non-ACS:    Anginal Classification: CCS III  Anti-ischemic medical therapy: Maximal Therapy (2 or more classes of medications)  Non-Invasive Test Results: No non-invasive testing performed  Prior CABG: No previous CABG  Dava Rensch

## 2020-02-14 ENCOUNTER — Encounter (HOSPITAL_COMMUNITY): Payer: Self-pay | Admitting: Cardiology

## 2020-02-14 DIAGNOSIS — R079 Chest pain, unspecified: Secondary | ICD-10-CM | POA: Diagnosis present

## 2020-02-14 DIAGNOSIS — I2510 Atherosclerotic heart disease of native coronary artery without angina pectoris: Secondary | ICD-10-CM | POA: Diagnosis not present

## 2020-02-14 DIAGNOSIS — I25119 Atherosclerotic heart disease of native coronary artery with unspecified angina pectoris: Secondary | ICD-10-CM

## 2020-02-14 DIAGNOSIS — I120 Hypertensive chronic kidney disease with stage 5 chronic kidney disease or end stage renal disease: Secondary | ICD-10-CM | POA: Diagnosis not present

## 2020-02-14 DIAGNOSIS — Z20822 Contact with and (suspected) exposure to covid-19: Secondary | ICD-10-CM | POA: Diagnosis not present

## 2020-02-14 DIAGNOSIS — N186 End stage renal disease: Secondary | ICD-10-CM | POA: Diagnosis not present

## 2020-02-14 LAB — CBC
HCT: 30.3 % — ABNORMAL LOW (ref 36.0–46.0)
Hemoglobin: 10.2 g/dL — ABNORMAL LOW (ref 12.0–15.0)
MCH: 31.4 pg (ref 26.0–34.0)
MCHC: 33.7 g/dL (ref 30.0–36.0)
MCV: 93.2 fL (ref 80.0–100.0)
Platelets: 189 10*3/uL (ref 150–400)
RBC: 3.25 MIL/uL — ABNORMAL LOW (ref 3.87–5.11)
RDW: 15.8 % — ABNORMAL HIGH (ref 11.5–15.5)
WBC: 8.4 10*3/uL (ref 4.0–10.5)
nRBC: 0 % (ref 0.0–0.2)

## 2020-02-14 LAB — GLUCOSE, CAPILLARY
Glucose-Capillary: 90 mg/dL (ref 70–99)
Glucose-Capillary: 142 mg/dL — ABNORMAL HIGH (ref 70–99)
Glucose-Capillary: 214 mg/dL — ABNORMAL HIGH (ref 70–99)
Glucose-Capillary: 227 mg/dL — ABNORMAL HIGH (ref 70–99)

## 2020-02-14 LAB — BASIC METABOLIC PANEL
Anion gap: 13 (ref 5–15)
BUN: 57 mg/dL — ABNORMAL HIGH (ref 6–20)
CO2: 24 mmol/L (ref 22–32)
Calcium: 9.5 mg/dL (ref 8.9–10.3)
Chloride: 93 mmol/L — ABNORMAL LOW (ref 98–111)
Creatinine, Ser: 10.06 mg/dL — ABNORMAL HIGH (ref 0.44–1.00)
GFR, Estimated: 4 mL/min — ABNORMAL LOW (ref >60)
Glucose, Bld: 208 mg/dL — ABNORMAL HIGH (ref 70–99)
Potassium: 5.9 mmol/L — ABNORMAL HIGH (ref 3.5–5.1)
Sodium: 130 mmol/L — ABNORMAL LOW (ref 135–145)

## 2020-02-14 MED ORDER — PENTAFLUOROPROP-TETRAFLUOROETH EX AERO: 1.0000 "application " | INHALATION_SPRAY | CUTANEOUS | Status: DC | PRN

## 2020-02-14 MED ORDER — SODIUM CHLORIDE 0.9 % IV SOLN: 100.0000 mL | INTRAVENOUS | Status: DC | PRN

## 2020-02-14 MED ORDER — CHLORHEXIDINE GLUCONATE CLOTH 2 % EX PADS
6.0000 | MEDICATED_PAD | Freq: Every day | CUTANEOUS | Status: DC
  Administered 2020-02-15: 6 via TOPICAL

## 2020-02-14 MED ORDER — LIDOCAINE HCL (PF) 1 % IJ SOLN: 5.0000 mL | INTRAMUSCULAR | Status: DC | PRN

## 2020-02-14 MED ORDER — ALTEPLASE 2 MG IJ SOLR: 2.0000 mg | Freq: Once | INTRAMUSCULAR | Status: DC | PRN

## 2020-02-14 MED ORDER — HEPARIN SODIUM (PORCINE) 1000 UNIT/ML DIALYSIS
1000.0000 [IU] | INTRAMUSCULAR | Status: DC | PRN
  Filled 2020-02-14: qty 1

## 2020-02-14 MED ORDER — FERRIC CITRATE 1 GM 210 MG(FE) PO TABS
420.0000 mg | ORAL_TABLET | Freq: Three times a day (TID) | ORAL | Status: DC
  Administered 2020-02-14 – 2020-02-15 (×2): 420 mg via ORAL
  Filled 2020-02-14 (×3): qty 2

## 2020-02-14 MED ORDER — LIDOCAINE-PRILOCAINE 2.5-2.5 % EX CREA
1.0000 "application " | TOPICAL_CREAM | CUTANEOUS | Status: DC | PRN
  Filled 2020-02-14: qty 5

## 2020-02-14 NOTE — Progress Notes (Addendum)
Progress Note  Patient Name: Paula Bass Date of Encounter: 02/14/2020  [facility] Cardiologist: Mertie Moores, MD   Subjective   No chest pain or SOB. Some soreness/swelling at right radial site.  Inpatient Medications    Scheduled Meds: . amLODipine  10 mg Oral Daily  . aspirin EC  81 mg Oral Daily  . atorvastatin  40 mg Oral q1800  . clopidogrel  75 mg Oral Daily  . heparin  5,000 Units Subcutaneous Q8H  . insulin aspart protamine- aspart  30 Units Subcutaneous BID WC  . isosorbide mononitrate  30 mg Oral Daily  . metoprolol tartrate  25 mg Oral BID  . multivitamin  1 tablet Oral QHS  . pantoprazole  40 mg Oral Daily  . predniSONE  5 mg Oral Q breakfast  . sodium chloride flush  3 mL Intravenous Q12H  . sodium chloride flush  3 mL Intravenous Q12H   Continuous Infusions: . sodium chloride     PRN Meds: sodium chloride, acetaminophen, nitroGLYCERIN, ondansetron (ZOFRAN) IV, sodium chloride flush   Vital Signs    Vitals:   02/13/20 2311 02/14/20 0511 02/14/20 0520 02/14/20 0811  BP: (!) 152/75 113/67  134/72  Pulse: 82 70  76  Resp:  15  16  Temp:  98.1 F (36.7 C)  98.4 F (36.9 C)  TempSrc:  Oral  Oral  SpO2: 100% 98%  99%  Weight:   63.8 kg   Height:       No intake or output data in the 24 hours ending 02/14/20 0902 Last 3 Weights 02/14/2020 02/13/2020 02/09/2020  Weight (lbs) 140 lb 10.5 oz 140 lb 142 lb 6.4 oz  Weight (kg) 63.8 kg 63.504 kg 64.592 kg      Telemetry    SR - Personally Reviewed  ECG    n/a - Personally Reviewed  Physical Exam   GEN: No acute distress.   Neck: No JVD Cardiac: RRR, no murmurs, rubs, or gallops.  Respiratory: Clear to auscultation bilaterally. GI: Soft, nontender, non-distended  MS: No edema; No deformity. Neuro:  Nonfocal  Psych: Normal affect   Labs    High Sensitivity Troponin:  No results for input(s): TROPONINIHS in the last 720 hours.    Chemistry Recent Labs  Lab 02/09/20 1605   NA 141  K 4.5  CL 98  CO2 27  GLUCOSE 103*  BUN 45*  CREATININE 10.23*  CALCIUM 9.8  GFRNONAA 4*  GFRAA 5*     Hematology Recent Labs  Lab 02/09/20 1605  WBC 7.0  RBC 3.50*  HGB 10.9*  HCT 31.4*  MCV 90  MCH 31.1  MCHC 34.7  RDW 14.9  PLT 179    BNPNo results for input(s): BNP, PROBNP in the last 168 hours.   DDimer No results for input(s): DDIMER in the last 168 hours.   Radiology    CARDIAC CATHETERIZATION  Result Date: 02/13/2020 Conclusions: 1. Severe two-vessel coronary artery disease including multifocal disease involving the LCx/OM's (70% proximal OM1, 99% proximal OM2, and 60% mid/distal LCx at the takeoff of OM2) as well as serial 99% and 70% mid RCA lesions. 2. Moderate, nonobstructive proximal/mid LAD disease that is not hemodynamically significant (FFR 0.91). 3. Patent proximal RCA and distal LCx stents. 4. Successful orbital atherectomy/PCI to OM1 using Resolute Onyx 3.0 x 26 mm drug-eluting stent with 0% residual stenosis and TIMI-3 flow. 5. Successful orbital atherectomy/PCI to OM 2 using Resolute Onyx 2.25 x 8 mm drug-eluting stent with  0% residual stenosis and TIMI-3 flow. Recommendations: 1. Overnight extended recovery.  Nephrology notified to facilitate dialysis tomorrow prior to discharge or delay outpatient dialysis till later tomorrow. 2. Continue indefinite dual antiplatelet therapy with aspirin and clopidogrel. 3. Aggressive secondary prevention. 4. Favor medical management of RCA.  If she continues to have some angina, orbital atherectomy/PCI could be considered in a staged fashion. [name], MD [facility]   Cardiac Studies    Patient Profile     49 y.o. female Paula Bass is a 49 y.o. female with a hx of CAD, ESRD on HD, lupus, HTN, DM, HLD and prior GI bleed admitted after outpatient cath following PCI to OM1 and OM2.   Assessment & Plan    1. CAD - history of prior PCI to RCA in 2017 in [place] - 03/2018 showed patent pRCA  stent. 80-90% mid/distal LCx s/p synergy DES. She had 90% mRCA stenosis which treated medically   - during 02/09/20 reported chest pain and SOB - she his also undergoing eval for possible kidney transplant - referred for cath as outpatient - 02/14/20 cath: LM normal, prox LAD 50% neg by FFR, OM1 70%, OM2 99%, RCA mid 99%. Received atherectomy and DES to OM1, atherectomy and DES to OM2.Could consider RCA intervention if refractory angina.   - small hematoma right radial site, monitor today. Would let this site heal and reconsider RCA intervention as outpatient. Has left radial HD graft, recurrent hematomas previously from caths from the groin. Ongoing disease likely will affect her candidacy for transplant.   2. ESRD - T/Th/Sat, will not be able to discharge today  - will make renal aware regarding HD today.       For questions or updates, please contact [place] Please consult [email or web address] for contact info under        Signed, Carlyle Dolly, MD  02/14/2020, 9:02 AM

## 2020-02-14 NOTE — Progress Notes (Signed)
CARDIAC REHAB PHASE I   PRE:  Rate/Rhythm: 90 SR  BP:  Supine:   Sitting: 150/83  Standing:    SaO2: 98 RA  MODE:  Ambulation: 200 ft   POST:  Rate/Rhythm: 93 SR  BP:  Supine:   Sitting: 166/85  Standing:    SaO2: 97% RA Tolerated ambulation well without angina, did c/o right arm pain due to right wrist hematoma post PCI 02/13/2020.  Education completed post PCI re: angina symptoms, NTG usage, when to call 911, when to call the doctor, dual platelet therapy, home exercise progression, referred to phase II cardiac rehab in [place]. [id number] Liliane Channel RN, BSN 02/14/2020 10:25 AM

## 2020-02-14 NOTE — Consult Note (Addendum)
[facility] Renal Consultation Note    Indication for Consultation:  Management of ESRD/hemodialysis; anemia, hypertension/volume and secondary hyperparathyroidism  [id number], [name], MD  HPI: Paula Bass is a 49 y.o. female with ESRD on HD TTS at [facility].  Past medical history significant for [place], DM, HTN, Obesity, and CAD s/p PCI and stent.   Patient presented to [facility] on 12/17 for planned cardiac cath due to accelerating angina.  Underwent successful cardiac cath with atherectomy and DEC to OM1 and OM2.  Small hematoma developed at R radial site so patient will remain in the hospital for monitoring.    Seen and examined at bedside.  Reports ongoing symptoms similar to before the procedure.  Complains of belching and chest tightness.  Has had some orthopnea, but denies currently.  Denies n/v/d, abdominal pain, weakness, dizziness and fatigue.  Reports drinking a little more today than normal.   Past Medical History:  Diagnosis Date  . Acute diverticulitis 12/01/2017  . CAD (coronary artery disease) 2016  . CAD (coronary artery disease) 04/19/2018   Hx of PCI to [facility] in [place] in 2017 // [place] 03/2018: dLCx 74, OM1 40, OM2 50, pRCA stent ok, mid RCA 90, EF 55-65, PCI: DES to dLCx  . Diabetes mellitus without complication ([place])   . Dialysis patient ([place])   . Diverticulitis   . GI bleeding   . Hypertension   . LGI bleed 12/16/2017  . Lower GI bleed   . Lupus ([place])   . Renal disorder    dialysis  . UTI (urinary tract infection) 12/01/2017   Past Surgical History:  Procedure Laterality Date  . BIOPSY  10/24/2019   Procedure: BIOPSY;  Surgeon: Otis Brace, MD;  Location: [facility];  Service: Gastroenterology;;  EGD and COLON  . CARDIAC SURGERY     cardiac shunt  . COLONOSCOPY WITH PROPOFOL N/A 10/24/2019   Procedure: COLONOSCOPY WITH PROPOFOL;  Surgeon: Otis Brace, MD;  Location: [facility];  Service: Gastroenterology;  Laterality: N/A;  .  CORONARY STENT INTERVENTION N/A 04/19/2018   Procedure: CORONARY STENT INTERVENTION;  Surgeon: [name], MD;  Location: [facility];  Service: Cardiovascular;  Laterality: N/A;  . ESOPHAGOGASTRODUODENOSCOPY (EGD) WITH PROPOFOL N/A 10/24/2019   Procedure: ESOPHAGOGASTRODUODENOSCOPY (EGD) WITH PROPOFOL;  Surgeon: Otis Brace, MD;  Location: [facility];  Service: Gastroenterology;  Laterality: N/A;  . INCISION AND DRAINAGE PERIRECTAL ABSCESS N/A 07/09/2019   Procedure: IRRIGATION AND DEBRIDEMEN right buttock ABSCESS;  Surgeon: Kieth Brightly, Arta Bruce, MD;  Location: [facility];  Service: General;  Laterality: N/A;  . Kidney graft Left   . LEFT HEART CATH AND CORONARY ANGIOGRAPHY N/A 04/19/2018   Procedure: LEFT HEART CATH AND CORONARY ANGIOGRAPHY;  Surgeon: [name], MD;  Location: [facility];  Service: Cardiovascular;  Laterality: N/A;  . POLYPECTOMY  10/24/2019   Procedure: POLYPECTOMY;  Surgeon: Otis Brace, MD;  Location: [facility];  Service: Gastroenterology;;   Family History  Problem Relation Age of Onset  . Hypertension Mother   . Other Father        unkown health history  . Healthy Brother   . Healthy Brother   . Colon cancer Neg Hx   . Esophageal cancer Neg Hx   . Breast cancer Neg Hx    Social History:  reports that she has never smoked. She has never used smokeless tobacco. She reports that she does not drink alcohol and does not use drugs. Allergies  Allergen Reactions  . Penicillins Hives  DID THE REACTION INVOLVE: Swelling of the face/tongue/throat, SOB, or low BP? Yes Sudden or severe rash/hives, skin peeling, or the inside of the mouth or nose? Yes Did it require medical treatment? No When did it last happen?Within the past 10 years If all above answers are "NO", may proceed with cephalosporin use. Ceftriaxone/merrem ok   Prior to Admission medications   Medication Sig Start Date End Date Taking? Authorizing Provider   acetaminophen (TYLENOL) 325 MG tablet Take 2 tablets (650 mg total) by mouth every 4 (four) hours as needed for fever, headache or mild pain. 07/10/19  Yes Saverio Danker, PA-C  amLODipine (NORVASC) 10 MG tablet Take 10 mg by mouth daily.   Yes [provider]  aspirin EC 81 MG tablet Take 1 tablet (81 mg total) by mouth daily. 04/22/18  Yes Reino Bellis B, NP  atorvastatin (LIPITOR) 40 MG tablet Take 40 mg by mouth daily.  10/25/17  Yes [provider]  AURYXIA 1 GM 210 MG(Fe) tablet Take 210-420 tablets by mouth See admin instructions. Take 420 mg by mouth three times a day with meals and 210 mg twice a day with a snack 06/09/19  Yes [provider]  clopidogrel (PLAVIX) 75 MG tablet Take 1 tablet (75 mg total) by mouth daily. 04/22/18  Yes Cheryln Manly, NP  insulin NPH-regular Human (70-30) 100 UNIT/ML injection Inject 30-40 Units into the skin See admin instructions. Inject 40 units in the morning and 30 units at bedtime.   Yes [provider]  isosorbide mononitrate (IMDUR) 60 MG 24 hr tablet Take 1 tablet (60 mg total) by mouth daily. Patient taking differently: Take 30 mg by mouth daily. 02/09/20  Yes Bhagat, Crista Luria, PA  lidocaine-prilocaine (EMLA) cream Apply 1 application topically Every Tuesday,Thursday,and Saturday with dialysis.  11/18/17  Yes [provider]  metoprolol tartrate (LOPRESSOR) 25 MG tablet Take 1 tablet (25 mg total) by mouth 2 (two) times daily. 06/16/19  Yes Nahser, Wonda Cheng, MD  multivitamin (RENA-VIT) TABS tablet Take 1 tablet by mouth daily.   Yes [provider]  pantoprazole (PROTONIX) 40 MG tablet Take 1 tablet (40 mg total) by mouth daily. Needs office visit. Patient taking differently: Take 40 mg by mouth daily. 10/24/19  Yes Brahmbhatt, Parag, MD  Polyethyl Glycol-Propyl Glycol 0.4-0.3 % SOLN Place 1 drop into both eyes 3 (three) times daily as needed (dry/irritated eyes.).   Yes [provider]  predniSONE (DELTASONE) 5 MG tablet Take 5 mg by mouth daily with breakfast. 05/14/19  Yes [provider]  ammonium lactate (AMLACTIN) 12 % lotion Apply 1 application topically as needed for dry skin. Patient not taking: Reported on 02/10/2020 05/16/19   Felipa Furnace, DPM  nitroGLYCERIN (NITROSTAT) 0.4 MG SL tablet Place 1 tablet (0.4 mg total) under the tongue every 5 (five) minutes as needed. 04/22/18   Cheryln Manly, NP   Current Facility-Administered Medications  Medication Dose Route Frequency Provider Last Rate Last Admin  . 0.9 %  sodium chloride infusion  250 mL Intravenous PRN End, Harrell Gave, MD      . acetaminophen (TYLENOL) tablet 650 mg  650 mg Oral Q4H PRN End, Harrell Gave, MD   650 mg at 02/13/20 2305  . amLODipine (NORVASC) tablet 10 mg  10 mg Oral Daily End, Christopher, MD   10 mg at 02/14/20 0859  . aspirin EC tablet 81 mg  81 mg Oral Daily End, Christopher, MD   81 mg at 02/14/20 0859  .  atorvastatin (LIPITOR) tablet 40 mg  40 mg Oral q1800 End, Christopher, MD   40 mg at 02/13/20 1851  . clopidogrel (PLAVIX) tablet 75 mg  75 mg Oral Daily End, Christopher, MD   75 mg at 02/14/20 0859  . heparin injection 5,000 Units  5,000 Units Subcutaneous Q8H End, Christopher, MD   5,000 Units at 02/14/20 0522  . insulin aspart protamine- aspart (NOVOLOG MIX 70/30) injection 30 Units  30 Units Subcutaneous BID WC End, Christopher, MD      . isosorbide mononitrate (IMDUR) 24 hr tablet 30 mg  30 mg Oral Daily End, Christopher, MD   30 mg at 02/14/20 0858  . metoprolol tartrate (LOPRESSOR) tablet 25 mg  25 mg Oral BID End, Christopher, MD   25 mg at 02/14/20 0859  . multivitamin (RENA-VIT) tablet 1 tablet  1 tablet Oral QHS End, Christopher, MD      . nitroGLYCERIN (NITROSTAT) SL tablet 0.4 mg  0.4 mg Sublingual Q5 min PRN End, Harrell Gave, MD      . ondansetron (ZOFRAN) injection 4 mg  4 mg Intravenous Q6H PRN End, Christopher, MD      . pantoprazole (PROTONIX) EC tablet  40 mg  40 mg Oral Daily End, Christopher, MD   40 mg at 02/14/20 0858  . predniSONE (DELTASONE) tablet 5 mg  5 mg Oral Q breakfast End, Christopher, MD   5 mg at 02/14/20 0859  . sodium chloride flush (NS) 0.9 % injection 3 mL  3 mL Intravenous Q12H Bhagat, Bhavinkumar, PA   3 mL at 02/14/20 0858  . sodium chloride flush (NS) 0.9 % injection 3 mL  3 mL Intravenous Q12H End, Christopher, MD   3 mL at 02/14/20 0858  . sodium chloride flush (NS) 0.9 % injection 3 mL  3 mL Intravenous PRN End, Christopher, MD       Labs: Basic Metabolic Panel: Recent Labs  Lab 02/09/20 1605  NA 141  K 4.5  CL 98  CO2 27  GLUCOSE 103*  BUN 45*  CREATININE 10.23*  CALCIUM 9.8   CBC: Recent Labs  Lab 02/09/20 1605  WBC 7.0  HGB 10.9*  HCT 31.4*  MCV 90  PLT 179   CBG: Recent Labs  Lab 02/13/20 1029 02/13/20 1120 02/13/20 2135 02/14/20 0742 02/14/20 1144  GLUCAP 65* 127* 127* 90 142*   Studies/Results: CARDIAC CATHETERIZATION  Result Date: 02/13/2020 Conclusions: 1. Severe two-vessel coronary artery disease including multifocal disease involving the LCx/OM's (70% proximal OM1, 99% proximal OM2, and 60% mid/distal LCx at the takeoff of OM2) as well as serial 99% and 70% mid RCA lesions. 2. Moderate, nonobstructive proximal/mid LAD disease that is not hemodynamically significant (FFR 0.91). 3. Patent proximal RCA and distal LCx stents. 4. Successful orbital atherectomy/PCI to OM1 using Resolute Onyx 3.0 x 26 mm drug-eluting stent with 0% residual stenosis and TIMI-3 flow. 5. Successful orbital atherectomy/PCI to OM 2 using Resolute Onyx 2.25 x 8 mm drug-eluting stent with 0% residual stenosis and TIMI-3 flow. Recommendations: 1. Overnight extended recovery.  Nephrology notified to facilitate dialysis tomorrow prior to discharge or delay outpatient dialysis till later tomorrow. 2. Continue indefinite dual antiplatelet therapy with aspirin and clopidogrel. 3. Aggressive secondary prevention. 4. Favor  medical management of RCA.  If she continues to have some angina, orbital atherectomy/PCI could be considered in a staged fashion. [name], MD [facility]   ROS: All others negative except those listed in HPI.  Physical Exam: Vitals:   02/13/20 2311  02/14/20 0511 02/14/20 0520 02/14/20 0811  BP: (!) 152/75 113/67  134/72  Pulse: 82 70  76  Resp:  15  16  Temp:  98.1 F (36.7 C)  98.4 F (36.9 C)  TempSrc:  Oral  Oral  SpO2: 100% 98%  99%  Weight:   63.8 kg   Height:         General: WDWN NAD Head: NCAT sclera not icteric MMM Neck: Supple. No lymphadenopathy Lungs: CTA bilaterally. No wheeze, rales or rhonchi. Breathing is unlabored. Heart: RRR. No murmur, rubs or gallops.  Abdomen: soft, nontender, +BS, no guarding, no rebound tenderness M/S:  Equal strength b/l in upper and lower extremities.  Lower extremities:no edema, ischemic changes, or open wounds  Neuro: AAOx3. Moves all extremities spontaneously. Psych:  Responds to questions appropriately with a normal affect. Dialysis Access: LU AVG  Dialysis Orders:  TTS - [place]  3.25hrs, BFR 350, DFR 800,  EDW 62.5kg, 2K/ 2.25Ca  Access: LU AVG  Heparin none Mircera 30 mcg q2wks - last 12/14 Hectorol 50mcg IV qHD    Assessment/Plan: 1.  CAD - successful cath yesterday with atherectomy and DES to OM1 and OM2.  Consider RCA intervention as outpatient after radial site has healed.  Per cardio  2. Hematoma R radial site - monitor today. Per cardiology. 3.  ESRD -  On HD TTS.  Orders written for HD today per regular schedule. Labs prior.  4.  Hypertension/volume  - Blood pressure in goal.  Typically has small gains.  Plan for UF 1.5-2L as tolerated. Standing weights to better assess EDW.  5.  Anemia of CKD - last Hgb 10.9 on 12/13.  Check labs prior to HD.  ESA just dosed.  6.  Secondary Hyperparathyroidism -  Checking labs prior to HD. Usually in goal.  Continue VDRA, auryxia 2 AC. 7.  Nutrition - Renal diet w/fluid  restrictions.  8. Diabetes - per primary  Jen Mow, PA-C [facility] 02/14/2020, 12:15 PM

## 2020-02-14 NOTE — Procedures (Signed)
   I was present at this dialysis session, have reviewed the session itself and made  appropriate changes Kelly Splinter MD [place] pager [phone]   02/14/2020, 6:49 PM

## 2020-02-15 DIAGNOSIS — I2510 Atherosclerotic heart disease of native coronary artery without angina pectoris: Secondary | ICD-10-CM | POA: Diagnosis not present

## 2020-02-15 DIAGNOSIS — I25119 Atherosclerotic heart disease of native coronary artery with unspecified angina pectoris: Secondary | ICD-10-CM | POA: Diagnosis not present

## 2020-02-15 DIAGNOSIS — T148XXA Other injury of unspecified body region, initial encounter: Secondary | ICD-10-CM

## 2020-02-15 LAB — GLUCOSE, CAPILLARY
Glucose-Capillary: 48 mg/dL — ABNORMAL LOW (ref 70–99)
Glucose-Capillary: 73 mg/dL (ref 70–99)
Glucose-Capillary: 173 mg/dL — ABNORMAL HIGH (ref 70–99)

## 2020-02-15 MED ORDER — INSULIN NPH ISOPHANE & REGULAR (70-30) 100 UNIT/ML ~~LOC~~ SUSP: 30.0000 [IU] | SUBCUTANEOUS | 1 refills | Status: DC

## 2020-02-15 NOTE — Progress Notes (Signed)
Hypoglycemic Event  CBG: 48  Treatment: At 0734 pt CBG 48.  8oz of apple juice was given. Symptoms: Pt asymptomatic and alert and oriented x4.  Follow-up CBG: [id number] CBG Result:73  Possible Reasons for Event: Not enough PO intake in the last 12 hrs. Comments/MD notified: MD notified.    Paula Bass Anamarie Bo

## 2020-02-15 NOTE — Progress Notes (Signed)
[facility] Progress Note   Subjective:   Patient seen and examined at bedside.  Tolerated dialysis well yesterday.  Feeling better today.  Denies CP, SOB, n/v/d, weakness, dizziness and fatigue. Continues to have pain in R wrist/forearm.  Objective Vitals:   02/14/20 2005 02/14/20 2139 02/15/20 0038 02/15/20 0537  BP: (!) 141/76 130/85 128/79 (!) 141/75  Pulse: 86 85 82   Resp: 18  15 17   Temp: 98.1 F (36.7 C)  97.9 F (36.6 C) 98.4 F (36.9 C)  TempSrc: Oral  Oral Oral  SpO2: 98%  95% 98%  Weight:      Height:       Physical Exam General:WDWN female in NAD Heart:RRR Lungs:CTAB, nml WOB on RA Abdomen:soft, NTND Extremities:no LE edema, R wrist/forearm edema Dialysis Access: LU AVG   Filed Weights   02/14/20 0520 02/14/20 1405 02/14/20 1635  Weight: 63.8 kg 64.2 kg 62.2 kg    Intake/Output Summary (Last 24 hours) at 02/15/2020 1109 Last data filed at 02/14/2020 1635 Gross per 24 hour  Intake --  Output 2000 ml  Net -2000 ml    Additional Objective Labs: Basic Metabolic Panel: Recent Labs  Lab 02/09/20 1605 02/14/20 1430  NA 141 130*  K 4.5 5.9*  CL 98 93*  CO2 27 24  GLUCOSE 103* 208*  BUN 45* 57*  CREATININE 10.23* 10.06*  CALCIUM 9.8 9.5   CBG: Recent Labs  Lab 02/14/20 1832 02/14/20 2117 02/15/20 0734 02/15/20 0803 02/15/20 1049  GLUCAP 214* 227* 48* 73 173*   Studies/Results: CARDIAC CATHETERIZATION  Addendum Date: 02/14/2020   Conclusions: 1. Severe two-vessel coronary artery disease including multifocal disease involving the LCx/OM's (70% proximal OM1, 99% proximal OM2, and 60% mid/distal LCx at the takeoff of OM2) as well as serial 99% and 70% mid RCA lesions. 2. Moderate, nonobstructive proximal/mid LAD disease that is not hemodynamically significant (FFR 0.91). 3. Patent proximal RCA and distal LCx stents. 4. Successful orbital atherectomy/PCI to OM1 using Resolute Onyx 3.0 x 26 mm drug-eluting stent with 0% residual  stenosis and TIMI-3 flow. 5. Successful orbital atherectomy/PCI to OM 2 using Resolute Onyx 2.25 x 8 mm drug-eluting stent with 0% residual stenosis and TIMI-3 flow. Recommendations: 1. Overnight extended recovery.  Nephrology notified to facilitate dialysis tomorrow prior to discharge or delay outpatient dialysis till later tomorrow. 2. Continue indefinite dual antiplatelet therapy with aspirin and clopidogrel. 3. Aggressive secondary prevention. 4. Favor medical management of RCA.  If she continues to have some angina, orbital atherectomy/PCI could be considered in a staged fashion. [name], MD [facility]   Result Date: 02/13/2020 Conclusions: 1. Severe two-vessel coronary artery disease including multifocal disease involving the LCx/OM's (70% proximal OM1, 99% proximal OM2, and 60% mid/distal LCx at the takeoff of OM2) as well as serial 99% and 70% mid RCA lesions. 2. Moderate, nonobstructive proximal/mid LAD disease that is not hemodynamically significant (FFR 0.91). 3. Patent proximal RCA and distal LCx stents. 4. Successful orbital atherectomy/PCI to OM1 using Resolute Onyx 3.0 x 26 mm drug-eluting stent with 0% residual stenosis and TIMI-3 flow. 5. Successful orbital atherectomy/PCI to OM 2 using Resolute Onyx 2.25 x 8 mm drug-eluting stent with 0% residual stenosis and TIMI-3 flow. Recommendations: 1. Overnight extended recovery.  Nephrology notified to facilitate dialysis tomorrow prior to discharge or delay outpatient dialysis till later tomorrow. 2. Continue indefinite dual antiplatelet therapy with aspirin and clopidogrel. 3. Aggressive secondary prevention. 4. Favor medical management of RCA.  If she continues to  have some angina, orbital atherectomy/PCI could be considered in a staged fashion. [name], MD [facility]   Medications: . sodium chloride    . sodium chloride    . sodium chloride     . amLODipine  10 mg Oral Daily  . aspirin EC  81 mg Oral Daily  .  atorvastatin  40 mg Oral q1800  . Chlorhexidine Gluconate Cloth  6 each Topical Q0600  . clopidogrel  75 mg Oral Daily  . ferric citrate  420 mg Oral TID WC  . heparin  5,000 Units Subcutaneous Q8H  . insulin aspart protamine- aspart  30 Units Subcutaneous BID WC  . isosorbide mononitrate  30 mg Oral Daily  . metoprolol tartrate  25 mg Oral BID  . multivitamin  1 tablet Oral QHS  . pantoprazole  40 mg Oral Daily  . predniSONE  5 mg Oral Q breakfast  . sodium chloride flush  3 mL Intravenous Q12H  . sodium chloride flush  3 mL Intravenous Q12H    Dialysis Orders: TTS - [place]  3.25hrs, BFR 350, DFR 800,  EDW 62.5kg, 2K/ 2.25Ca  Access: LU AVG  Heparin none Mircera 30 mcg q2wks - last 12/14 Hectorol 77mcg IV qHD    Assessment/Plan: 1.  CAD - successful cath 12/18 with atherectomy and DES to OM1 and OM2.  Considering RCA intervention as outpatient after radial site has healed.  Per cardio  2. Hematoma R radial site - stable Per cardiology. 3.  ESRD -  On HD TTS.  Orders written for HD today per regular schedule. Labs prior.  4.  Hypertension/volume  - Blood pressure slightly elevated.  Typically has small gains.  UF 2L yesterday. K 5.9 pre HD. Post weight 62.2kg. 5.  Anemia of CKD -Hgb 10.2.  ESA just dosed.  6.  Secondary Hyperparathyroidism -  Calcium in goal. Continue VDRA, auryxia 2 AC. 7.  Nutrition - Renal diet w/fluid restrictions.  8. Diabetes - per primary   Jen Mow, PA-C [facility] 02/15/2020,11:09 AM  LOS: 0 days

## 2020-02-15 NOTE — Discharge Summary (Signed)
Discharge Summary    Patient ID: Paula Bass MRN: [id number]; DOB: [birth year]  Admit date: 02/13/2020 Discharge date: 02/15/2020  Primary Care Provider: Benito Mccreedy, MD  Primary Cardiologist: Mertie Moores, MD  Primary Electrophysiologist:  None   Discharge Diagnoses    Principal Problem:   CAD (coronary artery disease) Active Problems:   ESRD (end stage renal disease) ([place])   DM2 (diabetes mellitus, type 2) ([place])   HLD (hyperlipidemia)   Accelerating angina ([place])   Chest pain   Hematoma right wrist   DM with hypoglycemia event   Diagnostic Studies/Procedures    Cardiac catheterization 02/13/20 Conclusion  Conclusions: 1. Severe two-vessel coronary artery disease including multifocal disease involving the LCx/OM's (70% proximal OM1, 99% proximal OM2, and 60% mid/distal LCx at the takeoff of OM2) as well as serial 99% and 70% mid RCA lesions. 2. Moderate, nonobstructive proximal/mid LAD disease that is not hemodynamically significant (FFR 0.91). 3. Patent proximal RCA and distal LCx stents. 4. Successful orbital atherectomy/PCI to OM1 using Resolute Onyx 3.0 x 26 mm drug-eluting stent with 0% residual stenosis and TIMI-3 flow. 5. Successful orbital atherectomy/PCI to OM 2 using Resolute Onyx 2.25 x 8 mm drug-eluting stent with 0% residual stenosis and TIMI-3 flow.  Recommendations: 1. Overnight extended recovery.  Nephrology notified to facilitate dialysis tomorrow prior to discharge or delay outpatient dialysis till later tomorrow. 2. Continue indefinite dual antiplatelet therapy with aspirin and clopidogrel. 3. Aggressive secondary prevention. 4. Favor medical management of RCA.  If she continues to have some angina, orbital atherectomy/PCI could be considered in a staged fashion.  [name], MD [facility]    Recommendations  Antiplatelet/Anticoag Recommend dual antiplatelet therapy with Aspirin 81mg  daily and Clopidogrel 75mg  daily.   Discharge Date In the absence of any other complications or medical issues, we expect the patient to be ready for discharge from an interventional cardiology perspective on 02/14/2020.   Coronary Diagrams   Diagnostic Dominance: Right    Intervention     _____________   History of Present Illness     Paula Bass is a 49 y.o. female with pmh of CAD with prior stenting, ESRD on HD, lupus, HTN, DM2, HLD, and prior GI bleed seen for kidney transplant clearance. She has a history of PCI to [facility] in 2017 at [place]. Lat cath 03/2018 showed patent RCA stent, 80-90 mid/distal LCx s/p synergy DES. She had 90% mRCA stenosis which was treated medically and will need orbital atherectomy for refractory angina. Her procedure was complicated by a large right hematoma. Her hemoglobin dropped and she required 2 PRBCs. CT was negative for retroperitoneal hematoma.   She was seen in the office 02/09/20 for kidney transplant cardiac clearance. She reported 3-4 months of intermittent substernal chest pressure associated with SOB. It was initially with exercise but now at rest. It resolved spontaneously after 30 minutes. Never took SL nitroglycerin. Sometimes felt burning nausea however takes protonix daily. Imdur was increased and the patient was set up for cardiac catheterization.    Hospital Course     Consultants: Nephrology for HD  CAD with prior stenting - hx of prior PCI to RCA in 2017 in [place] - cath in 03/2018 showed patent pRCA stenting, 80-90% mid/distal LCx s/p synergy DES, 90% mRCA stenosis which was treated medically.  - during pre-op evaluation 12/13 patient reported 3-4 months of unstable angina and set up for cath  - cath 02/14/20 showed normal LM, 50% stenosis prox LAD negative by FFR, 70% OM1, 99%  OM2,99%mRCA. She underwent atherectomy and DES to OM1, atherectomy and DES to OM2. Can consider RCA intervention if refractory angina, this can be set up as outpatient. - She had a small  hematoma at the right radial site and was kept overnight with stable site. Recommended to heal cath site (right radial) and reconsider RCA intervention as outpatient. HD access on Left arm. Assess cardiac clearance as outpatient for transplant.  - continue indefininte DAPT with aspirin and plavix,  - continue atorvastatin 40mg  daily, Lopressor 25mg BID, Imdur 60 mg daily  Pre-operative cardiac evaluation for kidney transplant - patient has an upcoming appointment with Dr. Acie Fredrickson.  Discuss kidney transplant and possible PCI to RCA at that time  ESRD - T/Th/Sat - nephrology consulted  HTN - stable with amlodipine 10mg , Imdur 60 mg daily, Lopressor 25mg  BID  HLD - LDL 43 03/2018 - continue atorvastatin  DM with hypoglycemia event - BS improved with oral intake. Advised to keep log of BS and follow up with PCP.   The patient was evaluated by Dr. Harl Bowie 02/15/20 and felt to be stable for discharge  Did the patient have an acute coronary syndrome (MI, NSTEMI, STEMI, etc) this admission?:  No                             AHA/ACC Clinical Performance & Quality Measures: 1. Aspirin prescribed? - Yes 2. ADP Receptor Inhibitor (Plavix/Clopidogrel, Brilinta/Ticagrelor or Effient/Prasugrel) prescribed (includes medically managed patients)? - Yes 3. Beta Blocker prescribed? - Yes 4. High Intensity Statin (Lipitor 40-80mg  or Crestor 20-40mg ) prescribed? - Yes 5. EF assessed during THIS hospitalization? - No 6. For EF <40%, was ACEI/ARB prescribed? - Not Applicable (EF >/= 76%) 7. For EF <40%, Aldosterone Antagonist (Spironolactone or Eplerenone) prescribed? - Not Applicable (EF >/= 28%) 8. Cardiac Rehab Phase II ordered (including medically managed patients)? - Yes    Discharge Vitals Blood pressure (!) 141/75, pulse 82, temperature 98.4 F (36.9 C), temperature source Oral, resp. rate 17, height 5\' 1"  (1.549 m), weight 62.2 kg, last menstrual period 02/16/2018, SpO2 98 %.  Filed Weights    02/14/20 0520 02/14/20 1405 02/14/20 1635  Weight: 63.8 kg 64.2 kg 62.2 kg    Labs & Radiologic Studies    CBC Recent Labs    02/14/20 1430  WBC 8.4  HGB 10.2*  HCT 30.3*  MCV 93.2  PLT 315   Basic Metabolic Panel Recent Labs    02/14/20 1430  NA 130*  K 5.9*  CL 93*  CO2 24  GLUCOSE 208*  BUN 57*  CREATININE 10.06*  CALCIUM 9.5  _____________  CARDIAC CATHETERIZATION  Addendum Date: 02/14/2020   Conclusions: 1. Severe two-vessel coronary artery disease including multifocal disease involving the LCx/OM's (70% proximal OM1, 99% proximal OM2, and 60% mid/distal LCx at the takeoff of OM2) as well as serial 99% and 70% mid RCA lesions. 2. Moderate, nonobstructive proximal/mid LAD disease that is not hemodynamically significant (FFR 0.91). 3. Patent proximal RCA and distal LCx stents. 4. Successful orbital atherectomy/PCI to OM1 using Resolute Onyx 3.0 x 26 mm drug-eluting stent with 0% residual stenosis and TIMI-3 flow. 5. Successful orbital atherectomy/PCI to OM 2 using Resolute Onyx 2.25 x 8 mm drug-eluting stent with 0% residual stenosis and TIMI-3 flow. Recommendations: 1. Overnight extended recovery.  Nephrology notified to facilitate dialysis tomorrow prior to discharge or delay outpatient dialysis till later tomorrow. 2. Continue indefinite dual antiplatelet therapy with aspirin and clopidogrel.  3. Aggressive secondary prevention. 4. Favor medical management of RCA.  If she continues to have some angina, orbital atherectomy/PCI could be considered in a staged fashion. [name], MD [facility]   Result Date: 02/13/2020 Conclusions: 1. Severe two-vessel coronary artery disease including multifocal disease involving the LCx/OM's (70% proximal OM1, 99% proximal OM2, and 60% mid/distal LCx at the takeoff of OM2) as well as serial 99% and 70% mid RCA lesions. 2. Moderate, nonobstructive proximal/mid LAD disease that is not hemodynamically significant (FFR 0.91). 3. Patent  proximal RCA and distal LCx stents. 4. Successful orbital atherectomy/PCI to OM1 using Resolute Onyx 3.0 x 26 mm drug-eluting stent with 0% residual stenosis and TIMI-3 flow. 5. Successful orbital atherectomy/PCI to OM 2 using Resolute Onyx 2.25 x 8 mm drug-eluting stent with 0% residual stenosis and TIMI-3 flow. Recommendations: 1. Overnight extended recovery.  Nephrology notified to facilitate dialysis tomorrow prior to discharge or delay outpatient dialysis till later tomorrow. 2. Continue indefinite dual antiplatelet therapy with aspirin and clopidogrel. 3. Aggressive secondary prevention. 4. Favor medical management of RCA.  If she continues to have some angina, orbital atherectomy/PCI could be considered in a staged fashion. [name], MD [facility]  Disposition   Pt is being discharged home today in good condition.  Follow-up Plans & Appointments     Discharge Instructions    AMB Referral to Cardiac Rehabilitation - Phase II   Complete by: As directed    Diagnosis: Coronary Stents   After initial evaluation and assessments completed: Virtual Based Care may be provided alone or in conjunction with Phase 2 Cardiac Rehab based on patient barriers.: Yes   Diet - low sodium heart healthy   Complete by: As directed    Discharge instructions   Complete by: As directed    NO HEAVY LIFTING (>10lbs) X 2 WEEKS. NO SEXUAL ACTIVITY X 2 WEEKS. NO DRIVING X 1 WEEK. NO SOAKING BATHS, HOT TUBS, POOLS, ETC., X 7 DAYS.   Increase activity slowly   Complete by: As directed    No wound care   Complete by: As directed       Discharge Medications   Allergies as of 02/15/2020      Reactions   Penicillins Hives   DID THE REACTION INVOLVE: Swelling of the face/tongue/throat, SOB, or low BP? Yes Sudden or severe rash/hives, skin peeling, or the inside of the mouth or nose? Yes Did it require medical treatment? No When did it last happen?Within the past 10 years If all above answers are  "NO", may proceed with cephalosporin use. Ceftriaxone/merrem ok      Medication List    TAKE these medications   acetaminophen 325 MG tablet Commonly known as: TYLENOL Take 2 tablets (650 mg total) by mouth every 4 (four) hours as needed for fever, headache or mild pain.   amLODipine 10 MG tablet Commonly known as: NORVASC Take 10 mg by mouth daily.   ammonium lactate 12 % lotion Commonly known as: AmLactin Apply 1 application topically as needed for dry skin.   aspirin EC 81 MG tablet Take 1 tablet (81 mg total) by mouth daily.   atorvastatin 40 MG tablet Commonly known as: LIPITOR Take 40 mg by mouth daily.   Auryxia 1 GM 210 MG(Fe) tablet Generic drug: ferric citrate Take 210-420 tablets by mouth See admin instructions. Take 420 mg by mouth three times a day with meals and 210 mg twice a day with a snack   clopidogrel 75 MG tablet  Commonly known as: PLAVIX Take 1 tablet (75 mg total) by mouth daily.   insulin NPH-regular Human (70-30) 100 UNIT/ML injection Inject 30-40 Units into the skin See admin instructions. Inject 30 units in the morning and 30 units at bedtime. What changed: additional instructions   isosorbide mononitrate 60 MG 24 hr tablet Commonly known as: IMDUR Take 1 tablet (60 mg total) by mouth daily. What changed: how much to take   lidocaine-prilocaine cream Commonly known as: EMLA Apply 1 application topically Every Tuesday,Thursday,and Saturday with dialysis.   metoprolol tartrate 25 MG tablet Commonly known as: LOPRESSOR Take 1 tablet (25 mg total) by mouth 2 (two) times daily.   multivitamin Tabs tablet Take 1 tablet by mouth daily.   nitroGLYCERIN 0.4 MG SL tablet Commonly known as: Nitrostat Place 1 tablet (0.4 mg total) under the tongue every 5 (five) minutes as needed.   pantoprazole 40 MG tablet Commonly known as: PROTONIX Take 1 tablet (40 mg total) by mouth daily. Needs office visit. What changed: additional instructions    Polyethyl Glycol-Propyl Glycol 0.4-0.3 % Soln Place 1 drop into both eyes 3 (three) times daily as needed (dry/irritated eyes.).   predniSONE 5 MG tablet Commonly known as: DELTASONE Take 5 mg by mouth daily with breakfast.          Outstanding Labs/Studies   None  Duration of Discharge Encounter   Greater than 30 minutes including physician time.  Signed, Leanor Kail, PA 02/15/2020, 10:14 AM

## 2020-02-15 NOTE — Progress Notes (Signed)
Progress Note  Patient Name: Lesieli Bresee Kluesner Date of Encounter: 02/15/2020  [facility] Cardiologist: Mertie Moores, MD   Subjective   No chest pains or SOB. RIght wrist and arm pain.   Inpatient Medications    Scheduled Meds: . amLODipine  10 mg Oral Daily  . aspirin EC  81 mg Oral Daily  . atorvastatin  40 mg Oral q1800  . Chlorhexidine Gluconate Cloth  6 each Topical Q0600  . clopidogrel  75 mg Oral Daily  . ferric citrate  420 mg Oral TID WC  . heparin  5,000 Units Subcutaneous Q8H  . insulin aspart protamine- aspart  30 Units Subcutaneous BID WC  . isosorbide mononitrate  30 mg Oral Daily  . metoprolol tartrate  25 mg Oral BID  . multivitamin  1 tablet Oral QHS  . pantoprazole  40 mg Oral Daily  . predniSONE  5 mg Oral Q breakfast  . sodium chloride flush  3 mL Intravenous Q12H  . sodium chloride flush  3 mL Intravenous Q12H   Continuous Infusions: . sodium chloride    . sodium chloride    . sodium chloride     PRN Meds: sodium chloride, sodium chloride, sodium chloride, acetaminophen, alteplase, heparin, lidocaine (PF), lidocaine-prilocaine, nitroGLYCERIN, ondansetron (ZOFRAN) IV, pentafluoroprop-tetrafluoroeth, sodium chloride flush   Vital Signs    Vitals:   02/14/20 2005 02/14/20 2139 02/15/20 0038 02/15/20 0537  BP: (!) 141/76 130/85 128/79 (!) 141/75  Pulse: 86 85 82   Resp: 18  15 17   Temp: 98.1 F (36.7 C)  97.9 F (36.6 C) 98.4 F (36.9 C)  TempSrc: Oral  Oral Oral  SpO2: 98%  95% 98%  Weight:      Height:        Intake/Output Summary (Last 24 hours) at 02/15/2020 0930 Last data filed at 02/14/2020 1635 Gross per 24 hour  Intake --  Output 2000 ml  Net -2000 ml   Last 3 Weights 02/14/2020 02/14/2020 02/14/2020  Weight (lbs) 137 lb 2 oz 141 lb 8.6 oz 140 lb 10.5 oz  Weight (kg) 62.2 kg 64.2 kg 63.8 kg      Telemetry    SR - Personally Reviewed  ECG    n/a - Personally Reviewed  Physical Exam   GEN: No acute  distress.   Neck: No JVD Cardiac: RRR, no murmurs, rubs, or gallops.  Respiratory: Clear to auscultation bilaterally. GI: Soft, nontender, non-distended  MS: No edema; No deformity. Neuro:  Nonfocal  Psych: Normal affect   Right wrist small hematoma stable from yesterday.   Labs    High Sensitivity Troponin:  No results for input(s): TROPONINIHS in the last 720 hours.    Chemistry Recent Labs  Lab 02/09/20 1605 02/14/20 1430  NA 141 130*  K 4.5 5.9*  CL 98 93*  CO2 27 24  GLUCOSE 103* 208*  BUN 45* 57*  CREATININE 10.23* 10.06*  CALCIUM 9.8 9.5  GFRNONAA 4* 4*  GFRAA 5*  --   ANIONGAP  --  13     Hematology Recent Labs  Lab 02/09/20 1605 02/14/20 1430  WBC 7.0 8.4  RBC 3.50* 3.25*  HGB 10.9* 10.2*  HCT 31.4* 30.3*  MCV 90 93.2  MCH 31.1 31.4  MCHC 34.7 33.7  RDW 14.9 15.8*  PLT 179 189    BNPNo results for input(s): BNP, PROBNP in the last 168 hours.   DDimer No results for input(s): DDIMER in the last 168 hours.   Radiology  CARDIAC CATHETERIZATION  Addendum Date: 02/14/2020   Conclusions: 1. Severe two-vessel coronary artery disease including multifocal disease involving the LCx/OM's (70% proximal OM1, 99% proximal OM2, and 60% mid/distal LCx at the takeoff of OM2) as well as serial 99% and 70% mid RCA lesions. 2. Moderate, nonobstructive proximal/mid LAD disease that is not hemodynamically significant (FFR 0.91). 3. Patent proximal RCA and distal LCx stents. 4. Successful orbital atherectomy/PCI to OM1 using Resolute Onyx 3.0 x 26 mm drug-eluting stent with 0% residual stenosis and TIMI-3 flow. 5. Successful orbital atherectomy/PCI to OM 2 using Resolute Onyx 2.25 x 8 mm drug-eluting stent with 0% residual stenosis and TIMI-3 flow. Recommendations: 1. Overnight extended recovery.  Nephrology notified to facilitate dialysis tomorrow prior to discharge or delay outpatient dialysis till later tomorrow. 2. Continue indefinite dual antiplatelet therapy with  aspirin and clopidogrel. 3. Aggressive secondary prevention. 4. Favor medical management of RCA.  If she continues to have some angina, orbital atherectomy/PCI could be considered in a staged fashion. [name], MD [facility]   Result Date: 02/13/2020 Conclusions: 1. Severe two-vessel coronary artery disease including multifocal disease involving the LCx/OM's (70% proximal OM1, 99% proximal OM2, and 60% mid/distal LCx at the takeoff of OM2) as well as serial 99% and 70% mid RCA lesions. 2. Moderate, nonobstructive proximal/mid LAD disease that is not hemodynamically significant (FFR 0.91). 3. Patent proximal RCA and distal LCx stents. 4. Successful orbital atherectomy/PCI to OM1 using Resolute Onyx 3.0 x 26 mm drug-eluting stent with 0% residual stenosis and TIMI-3 flow. 5. Successful orbital atherectomy/PCI to OM 2 using Resolute Onyx 2.25 x 8 mm drug-eluting stent with 0% residual stenosis and TIMI-3 flow. Recommendations: 1. Overnight extended recovery.  Nephrology notified to facilitate dialysis tomorrow prior to discharge or delay outpatient dialysis till later tomorrow. 2. Continue indefinite dual antiplatelet therapy with aspirin and clopidogrel. 3. Aggressive secondary prevention. 4. Favor medical management of RCA.  If she continues to have some angina, orbital atherectomy/PCI could be considered in a staged fashion. [name], MD [facility]   Cardiac Studies    Patient Profile     49 y.o. female Damari Hiltz Pridgenis a 49 y.o.femalewith a hx of CAD, ESRD on HD, lupus, HTN, DM, HLD and prior GI bleed admitted after outpatient cath following PCI to OM1 and OM2.   Assessment & Plan    1. CAD - history of prior PCI to RCA in 2017 in [place] - 03/2018 showed patent pRCA stent. 80-90% mid/distal LCx s/p synergy DES. She had 90% mRCA stenosis which treated medically   - during 02/09/20 reported chest pain and SOB - she is also undergoing eval for possible kidney  transplant - referred for cath as outpatient - 02/14/20 cath: LM normal, prox LAD 50% neg by FFR, OM1 70%, OM2 99%, RCA mid 99%. Received atherectomy and DES to OM1, atherectomy and DES to OM2.Could consider RCA intervention if refractory angina.   - small hematoma right radial site is stable. Would let this site heal and reconsider RCA intervention as outpatient. Her HD access is in her left arm, recurrent hematomas previously from caths from the groin and she is very hesitant for cath access there. Ongoing obstructive disease likely will affect her candidacy for transplant, can reassess in few weeks after right radial site heals  if needs  RCA intervention  2. ESRD - T/Th/Sat, will not be able to discharge today  - will make renal aware regarding HD today.  3. Hypoglycemia - BG down to  48 this AM, asymptomatic, resolved with oral intake.  - she is on insulin 70/30 40 units in AM and 30 in PM at home, has been on 30 bid here  [facility] for discharge today. Recheck BG before discharge.     For questions or updates, please contact [place] Please consult [email or web address] for contact info under        Signed, Carlyle Dolly, MD  02/15/2020, 9:30 AM

## 2020-02-16 ENCOUNTER — Encounter (HOSPITAL_COMMUNITY): Payer: Self-pay | Admitting: Internal Medicine

## 2020-02-16 ENCOUNTER — Telehealth: Payer: Self-pay | Admitting: Nurse Practitioner

## 2020-02-16 NOTE — Telephone Encounter (Signed)
Transition of care contact from inpatient facility  Date of discharge: 02/15/2020 Date of contact: 02/16/2020 Method: Phone Spoke to: Patient  Patient contacted to discuss transition of care from recent inpatient hospitalization. Patient was admitted to [facility] from 12/17-12/19/2021 with discharge diagnosis of Severe two-vessel coronary artery disease including multifocal disease involving the LCx/OM's  S/P arthrectomy/PCI to OM1, OM2.   Medication changes were reviewed.  Patient will follow up with his/her outpatient HD unit on: 02/17/2020.

## 2020-02-17 ENCOUNTER — Telehealth (HOSPITAL_COMMUNITY): Payer: Self-pay

## 2020-02-17 NOTE — Telephone Encounter (Signed)
Called and spoke with pt in regards to CR, pt stated she is not interested at this time.   Closed referral 

## 2020-02-29 DIAGNOSIS — E876 Hypokalemia: Secondary | ICD-10-CM | POA: Diagnosis not present

## 2020-02-29 DIAGNOSIS — Z992 Dependence on renal dialysis: Secondary | ICD-10-CM | POA: Diagnosis not present

## 2020-02-29 DIAGNOSIS — N2581 Secondary hyperparathyroidism of renal origin: Secondary | ICD-10-CM | POA: Diagnosis not present

## 2020-02-29 DIAGNOSIS — D631 Anemia in chronic kidney disease: Secondary | ICD-10-CM | POA: Diagnosis not present

## 2020-02-29 DIAGNOSIS — N186 End stage renal disease: Secondary | ICD-10-CM | POA: Diagnosis not present

## 2020-03-02 DIAGNOSIS — N2581 Secondary hyperparathyroidism of renal origin: Secondary | ICD-10-CM | POA: Diagnosis not present

## 2020-03-02 DIAGNOSIS — D631 Anemia in chronic kidney disease: Secondary | ICD-10-CM | POA: Diagnosis not present

## 2020-03-02 DIAGNOSIS — N186 End stage renal disease: Secondary | ICD-10-CM | POA: Diagnosis not present

## 2020-03-02 DIAGNOSIS — E876 Hypokalemia: Secondary | ICD-10-CM | POA: Diagnosis not present

## 2020-03-02 DIAGNOSIS — Z992 Dependence on renal dialysis: Secondary | ICD-10-CM | POA: Diagnosis not present

## 2020-03-04 ENCOUNTER — Encounter: Payer: Self-pay | Admitting: Physician Assistant

## 2020-03-04 ENCOUNTER — Ambulatory Visit: Payer: Medicare HMO | Admitting: Cardiovascular Disease

## 2020-03-04 NOTE — Progress Notes (Signed)
Cardiology Office Note    Date:  03/05/2020   ID:  Paula Bass, Paula Bass [birth year], MRN [id number]  PCP:  Benito Mccreedy, MD  Cardiologist:  Mertie Moores, MD  Electrophysiologist:  None   Chief Complaint: post cath follow-up  History of Present Illness:   Paula Bass is a 50 y.o. female with history of CAD, ESRD on HD, lupus, HTN, DM, HLD, prior GI bleed, chronic appearing anemia who is seen back post-cath. She has history of CAD with PCI to [facility] in 2017 in [place]. In 03/2018 she had a repeat cath with 80-90% mid/distal LCx s/p synergy DES, with residual RCA stenosis treated medically. Her procedure was c/b a large R groin hematoma and ABL anemia requiring 2 U PRBCs. CT was neg for retroperitoneal hematoma. In 04/2018 and 10/2018 she was still having some residual chest pain with mixed features. This was managed medically with titration of nitrate. From review of notes it appears that during the during majority of OVs she has had some degree of chronic chest pain, except for 01/2019 virtual visit after she had improved her diet and avoided processed foods. Nuclear stress test in 01/2019 was normal without ischemia.   She recently presented back to the office for cardiac clearance for kidney transplant. She was reporting exertional chest pain concerning for angina. Imdur dose was increased. She underwent cardiac cath 02/13/20 demonstrating findings below with resultant orbital atherectomy/DES to the OM1 and OM2. Dr. Saunders Revel, interventional cardiologist, favored medical management of the residual RCA disease, to reconsider consider staged orbital atherectomy/PCI for residual symptoms. Discharge summary indicates recommendation to continue DAPT indefinitely with aspirin and Plavix.  She is seen back for follow-up. She reports she continued to have some chest pains even after her recent PCI, with varying degrees of symptoms. They can happen either at rest or with exertion, lasting seconds  to all day long. It happens about every other day. There is nothing that can always reliably reproduce them - has good days and bad days. They resolve spontaneously but every episode can be different. Sometimes they occur in a very focal spot to the left of her sternum. No radiation. She has noticed a lot of burping some mornings. She unfortunately has not been taking her isosorbide reliably because she states she went to pick them up at the pharmacy and they told her they didn't have it. So instead she has been spacing out her few remaining 30mg  doses. She also has missed several doses of atorvastatin. Denies missing any ASA/Plavix recently. Blood pressure is elevated which she attributes to eating very salty food yesterday. No SOB, nausea, diaphoresis, edema, orthopnea, palpitations, near-syncope or syncope.  Labwork independently reviewed: 01/2020 Hgb 10.2, Na 130, K 5.9, Cr 10.06, A1c 7.7 06/2019 AST 95, albumin 2.3, ALT wnl 2020 LDL 43   Past Medical History:  Diagnosis Date  . Acute diverticulitis 12/01/2017  . Anemia   . CAD in native artery    a. PCI to [facility] in 2017 in [place]. b.  In 03/2018 she had a repeat cath with 80-90% mid/distal LCx s/p synergy DES, with residual RCA stenosis mg'd medically. c.01/2020 s/p orbital atherectomy/DES to the OM1 and OM2, residual RCA stenosis.  . Diabetes mellitus without complication ([place])   . Dialysis patient ([place])   . Diverticulitis   . ESRD (end stage renal disease) ([place])   . GI bleeding   . Hypertension   . LGI bleed 12/16/2017  . Lower GI bleed   .  Lupus ([place])   . Renal disorder    dialysis  . UTI (urinary tract infection) 12/01/2017    Past Surgical History:  Procedure Laterality Date  . BIOPSY  10/24/2019   Procedure: BIOPSY;  Surgeon: Otis Brace, MD;  Location: [facility];  Service: Gastroenterology;;  EGD and COLON  . CARDIAC SURGERY     cardiac shunt  . COLONOSCOPY WITH PROPOFOL N/A 10/24/2019   Procedure: COLONOSCOPY WITH  PROPOFOL;  Surgeon: Otis Brace, MD;  Location: [facility];  Service: Gastroenterology;  Laterality: N/A;  . CORONARY ATHERECTOMY N/A 02/13/2020   Procedure: CORONARY ATHERECTOMY;  Surgeon: [name], MD;  Location: [place] [facility];  Service: Cardiovascular;  Laterality: N/A;  . CORONARY STENT INTERVENTION N/A 04/19/2018   Procedure: CORONARY STENT INTERVENTION;  Surgeon: [name], MD;  Location: [place] [facility];  Service: Cardiovascular;  Laterality: N/A;  . CORONARY STENT INTERVENTION N/A 02/13/2020   Procedure: CORONARY STENT INTERVENTION;  Surgeon: [name], MD;  Location: [place] [facility];  Service: Cardiovascular;  Laterality: N/A;  . ESOPHAGOGASTRODUODENOSCOPY (EGD) WITH PROPOFOL N/A 10/24/2019   Procedure: ESOPHAGOGASTRODUODENOSCOPY (EGD) WITH PROPOFOL;  Surgeon: Otis Brace, MD;  Location: [facility];  Service: Gastroenterology;  Laterality: N/A;  . INCISION AND DRAINAGE PERIRECTAL ABSCESS N/A 07/09/2019   Procedure: IRRIGATION AND DEBRIDEMEN right buttock ABSCESS;  Surgeon: Kieth Brightly, Arta Bruce, MD;  Location: [place];  Service: General;  Laterality: N/A;  . INTRAVASCULAR PRESSURE WIRE/FFR STUDY N/A 02/13/2020   Procedure: INTRAVASCULAR PRESSURE WIRE/FFR STUDY;  Surgeon: [name], MD;  Location: [place] [facility];  Service: Cardiovascular;  Laterality: N/A;  . Kidney graft Left   . LEFT HEART CATH AND CORONARY ANGIOGRAPHY N/A 04/19/2018   Procedure: LEFT HEART CATH AND CORONARY ANGIOGRAPHY;  Surgeon: [name], MD;  Location: [place] [facility];  Service: Cardiovascular;  Laterality: N/A;  . LEFT HEART CATH AND CORONARY ANGIOGRAPHY N/A 02/13/2020   Procedure: LEFT HEART CATH AND CORONARY ANGIOGRAPHY;  Surgeon: [name], MD;  Location: [place] [facility];  Service: Cardiovascular;  Laterality: N/A;  . POLYPECTOMY  10/24/2019   Procedure: POLYPECTOMY;  Surgeon: Otis Brace, MD;  Location: [facility];  Service:  Gastroenterology;;    Current Medications: Current Meds  Medication Sig  . acetaminophen (TYLENOL) 325 MG tablet Take 2 tablets (650 mg total) by mouth every 4 (four) hours as needed for fever, headache or mild pain.  Marland Kitchen amLODipine (NORVASC) 10 MG tablet Take 10 mg by mouth daily.  Marland Kitchen aspirin EC 81 MG tablet Take 1 tablet (81 mg total) by mouth daily.  Marland Kitchen atorvastatin (LIPITOR) 40 MG tablet Take 40 mg by mouth daily.   Lorin Picket 1 GM 210 MG(Fe) tablet Take 210-420 tablets by mouth See admin instructions. Take 420 mg by mouth three times a day with meals and 210 mg twice a day with a snack  . clopidogrel (PLAVIX) 75 MG tablet Take 1 tablet (75 mg total) by mouth daily.  . insulin NPH-regular Human (70-30) 100 UNIT/ML injection Inject 30-40 Units into the skin See admin instructions. Inject 30 units in the morning and 30 units at bedtime.  . isosorbide mononitrate (IMDUR) 60 MG 24 hr tablet Take 1 tablet (60 mg total) by mouth daily. (Patient taking differently: Take 30 mg by mouth daily.)  . lidocaine-prilocaine (EMLA) cream Apply 1 application topically Every Tuesday,Thursday,and Saturday with dialysis.   Marland Kitchen metoprolol tartrate (LOPRESSOR) 25 MG tablet Take 1 tablet (25 mg total) by mouth 2 (two) times daily.  Marland Kitchen  multivitamin (RENA-VIT) TABS tablet Take 1 tablet by mouth daily.  . nitroGLYCERIN (NITROSTAT) 0.4 MG SL tablet Place 1 tablet (0.4 mg total) under the tongue every 5 (five) minutes as needed.  . pantoprazole (PROTONIX) 40 MG tablet Take 1 tablet (40 mg total) by mouth daily. Needs office visit. (Patient taking differently: Take 40 mg by mouth daily.)  . Polyethyl Glycol-Propyl Glycol 0.4-0.3 % SOLN Place 1 drop into both eyes 3 (three) times daily as needed (dry/irritated eyes.).  Marland Kitchen predniSONE (DELTASONE) 5 MG tablet Take 5 mg by mouth daily with breakfast.  . [DISCONTINUED] ammonium lactate (AMLACTIN) 12 % lotion Apply 1 application topically as needed for dry skin.      Allergies:    Penicillins   Social History   Socioeconomic History  . Marital status: Single    Spouse name: Not on file  . Number of children: 1  . Years of education: Not on file  . Highest education level: Not on file  Occupational History  . Occupation: disabled  Tobacco Use  . Smoking status: Never Smoker  . Smokeless tobacco: Never Used  Vaping Use  . Vaping Use: Never used  Substance and Sexual Activity  . Alcohol use: Never  . Drug use: Never  . Sexual activity: Yes    Partners: Male    Birth control/protection: Condom  Other Topics Concern  . Not on file  Social History Narrative  . Not on file   Social Determinants of Health   Financial Resource Strain: Not on file  Food Insecurity: Not on file  Transportation Needs: Not on file  Physical Activity: Not on file  Stress: Not on file  Social Connections: Not on file     Family History:  The patient's family history includes Healthy in her brother and brother; Hypertension in her mother; Other in her father. There is no history of Colon cancer, Esophageal cancer, or Breast cancer.  ROS:   Please see the history of present illness.  All other systems are reviewed and otherwise negative.    EKGs/Labs/Other Studies Reviewed:    Studies reviewed are outlined and summarized above. Reports included below if pertinent.  Cardiac Cath 02/13/20 Conclusions: 1. Severe two-vessel coronary artery disease including multifocal disease involving the LCx/OM's (70% proximal OM1, 99% proximal OM2, and 60% mid/distal LCx at the takeoff of OM2) as well as serial 99% and 70% mid RCA lesions. 2. Moderate, nonobstructive proximal/mid LAD disease that is not hemodynamically significant (FFR 0.91). 3. Patent proximal RCA and distal LCx stents. 4. Successful orbital atherectomy/PCI to OM1 using Resolute Onyx 3.0 x 26 mm drug-eluting stent with 0% residual stenosis and TIMI-3 flow. 5. Successful orbital atherectomy/PCI to OM 2 using Resolute Onyx  2.25 x 8 mm drug-eluting stent with 0% residual stenosis and TIMI-3 flow.  Recommendations: 1. Overnight extended recovery.  Nephrology notified to facilitate dialysis tomorrow prior to discharge or delay outpatient dialysis till later tomorrow. 2. Continue indefinite dual antiplatelet therapy with aspirin and clopidogrel. 3. Aggressive secondary prevention. 4. Favor medical management of RCA.  If she continues to have some angina, orbital atherectomy/PCI could be considered in a staged fashion.  [name], MD [facility]  2D Echo 01/2019 1. Left ventricular ejection fraction, by visual estimation, is 60 to  65%. The left ventricle has normal function. There is mildly increased  left ventricular hypertrophy.  2. The left ventricle has no regional wall motion abnormalities.  3. Global right ventricle has normal systolic function.The right  ventricular  size is normal.  4. Left atrial size was normal.  5. Right atrial size was normal.  6. The mitral valve is normal in structure. Mild mitral valve  regurgitation.  7. The tricuspid valve is normal in structure. Tricuspid valve  regurgitation is not demonstrated.  8. The aortic valve is tricuspid. Aortic valve regurgitation is not  visualized. No evidence of aortic valve stenosis.  9. The pulmonic valve was not well visualized. Pulmonic valve  regurgitation is trivial.  10. TR signal is inadequate for assessing pulmonary artery systolic  pressure.  11. The inferior vena cava is normal in size with greater than 50%  respiratory variability, suggesting right atrial pressure of 3 mmHg.      EKG:  EKG is ordered today, personally reviewed, demonstrating NSR 69bpm, low voltage, no acute STT changes, similar to prior  Recent Labs: 07/06/2019: ALT 35 02/14/2020: BUN 57; Creatinine, Ser 10.06; Hemoglobin 10.2; Platelets 189; Potassium 5.9; Sodium 130  Recent Lipid Panel    Component Value Date/Time   CHOL 120  04/15/2018 0948   TRIG 92 04/15/2018 0948   HDL 59 04/15/2018 0948   CHOLHDL 2.0 04/15/2018 0948   LDLCALC 43 04/15/2018 0948    PHYSICAL EXAM:    VS:  BP (!) 158/90   Pulse 69   Ht 5\' 1"  (1.549 m)   Wt 142 lb 9.6 oz (64.7 kg)   LMP 02/16/2018 (Approximate)   BMI 26.94 kg/m   BMI: Body mass index is 26.94 kg/m.  GEN: Well nourished, well developed AAF, in no acute distress HEENT: normocephalic, atraumatic Neck: no JVD, carotid bruits, or masses Cardiac: RRR; no murmurs, rubs, or gallops, no edema  Respiratory:  clear to auscultation bilaterally, normal work of breathing GI: soft, nontender, nondistended, + BS MS: no deformity or atrophy Skin: warm and dry, no rash, right radial cath site without ecchymosis; good pulse, very small resolving hematoma without bruit or pulsation - pt reports gradually decreasing/improving since PCI Neuro:  Alert and Oriented x 3, Strength and sensation are intact, follows commands Psych: euthymic mood, full affect  Wt Readings from Last 3 Encounters:  03/05/20 142 lb 9.6 oz (64.7 kg)  02/14/20 137 lb 2 oz (62.2 kg)  02/09/20 142 lb 6.4 oz (64.6 kg)     ASSESSMENT & PLAN:   1. CAD in native artery - still with chest pain with mixed atypical/typical features. EKG is stable. No acute accelerating features since recent cath. She reports mild improvement post recent PCI but states this did not last more than a few hours. Unfortunately she has not been taking the isosorbide dosing correctly and therefore is not currently medically optimized. I conferred with Dr. Acie Fredrickson and at this time we'd recommend to try and optimize her antianginal regimen before revisiting PCI of the RCA. We called [facility] to make sure they had isosorbide on file. They did, they just didn't have the patient's insurance information at the time and so they did not process the rx. She will start this today and will notify our office if symptoms do not improve.  Her blood pressure is  also high today. This was normal in early December, with mixed readings in the hospital. Instructions given for monitoring her BP and notifying us of results on Monday. She reports her BP is better controlled on dialysis days and does not specifically drop with HD.  If need be, we can both titrate nitrate further or change metoprolol to carvedilol. She also did not have recent LVEF  assessment with her PCI/chest pain - will arrange 2D echocardiogram to assess. Will also update labs to ensure stable Hgb and lytes. Warning sx/ER precautions reviewed.  2. ESRD on HD with chronic appearing anemia - recently was seen for kidney transplant clearance. Her recent PCI will affect her candidacy for this as we will not be able to stop her DAPT anytime soon. Indefinite DAPT was recommended per hospital discharge summary but I did speak with Dr. Acie Fredrickson about this and he does feel at some point when she has completed her required DAPT course that she may be able to have this paused for renal transplantation. We cannot make any determinations about that right now she she's still having intermittent chest pain. I did ask her to keep her transplant team updated that she had recent stents.  3. Essential HTN - suboptimal blood pressure control noted today. Recheck by me 148/80. Patient feels this was due to salt load yesterday. She also has been out of her nitrate. Would recommend to resume Imdur 60mg  as instructed.  The patient was provided instructions on monitoring blood pressure at home over the weekend and notifying our office of those results on Monday. See above regarding recommendations. Check thyroid. With labs.  4. Hyperlipidemia - overdue for lipids. Has had a few missed doses of statin so does not make sense to check today. She did have elevated AST level in 06/2019 so we will at least get a CMET today in clinic. Refill sent in for atorvastatin today. If the patient is compliant of follow-up appointment, would consider  getting lipids then.  Disposition: F/u with Dr. Acie Fredrickson or APP in 1-2 weeks to reassess response to nitrate and blood pressure.  Medication Adjustments/Labs and Tests Ordered: Current medicines are reviewed at length with the patient today.  Concerns regarding medicines are outlined above. Medication changes, Labs and Tests ordered today are summarized above and listed in the Patient Instructions accessible in Encounters.   Signed, Charlie Pitter, PA-C  03/05/2020 12:14 PM    [facility] [address], [place], [place]  [address] Phone: [phone]; Fax: [phone]

## 2020-03-05 ENCOUNTER — Ambulatory Visit (INDEPENDENT_AMBULATORY_CARE_PROVIDER_SITE_OTHER): Payer: Medicare HMO | Admitting: Physician Assistant

## 2020-03-05 ENCOUNTER — Other Ambulatory Visit: Payer: Self-pay

## 2020-03-05 ENCOUNTER — Encounter: Payer: Self-pay | Admitting: Physician Assistant

## 2020-03-05 VITALS — BP 158/90 | HR 69 | Ht 61.0 in | Wt 142.6 lb

## 2020-03-05 DIAGNOSIS — Z992 Dependence on renal dialysis: Secondary | ICD-10-CM

## 2020-03-05 DIAGNOSIS — E785 Hyperlipidemia, unspecified: Secondary | ICD-10-CM | POA: Diagnosis not present

## 2020-03-05 DIAGNOSIS — N186 End stage renal disease: Secondary | ICD-10-CM | POA: Diagnosis not present

## 2020-03-05 DIAGNOSIS — I25118 Atherosclerotic heart disease of native coronary artery with other forms of angina pectoris: Secondary | ICD-10-CM | POA: Diagnosis not present

## 2020-03-05 DIAGNOSIS — I2510 Atherosclerotic heart disease of native coronary artery without angina pectoris: Secondary | ICD-10-CM

## 2020-03-05 DIAGNOSIS — I10 Essential (primary) hypertension: Secondary | ICD-10-CM | POA: Diagnosis not present

## 2020-03-05 DIAGNOSIS — D649 Anemia, unspecified: Secondary | ICD-10-CM

## 2020-03-05 MED ORDER — ISOSORBIDE MONONITRATE ER 60 MG PO TB24: 60.0000 mg | ORAL_TABLET | Freq: Every day | ORAL | 3 refills | Status: DC

## 2020-03-05 MED ORDER — ATORVASTATIN CALCIUM 40 MG PO TABS: 40.0000 mg | ORAL_TABLET | Freq: Every day | ORAL | 3 refills | Status: DC

## 2020-03-05 NOTE — Patient Instructions (Addendum)
Medication Instructions:  Your physician recommends that you continue on your current medications as directed. Please refer to the Current Medication list given to you today.  *If you need a refill on your cardiac medications before your next appointment, please call your pharmacy*   Lab Work: TODAY:  CBC, CMET, & TSH  If you have labs (blood work) drawn today and your tests are completely normal, you will receive your results only by: Marland Kitchen MyChart Message (if you have MyChart) OR . A paper copy in the mail If you have any lab test that is abnormal or we need to change your treatment, we will call you to review the results.   Testing/Procedures: Your physician has requested that you have an echocardiogram. Echocardiography is a painless test that uses sound waves to create images of your heart. It provides your doctor with information about the size and shape of your heart and how well your heart's chambers and valves are working. This procedure takes approximately one hour. There are no restrictions for this procedure.     Follow-Up: At [facility], you and your health needs are our priority.  As part of our continuing mission to provide you with exceptional heart care, we have created designated Provider Care Teams.  These Care Teams include your primary Cardiologist (physician) and Advanced Practice Providers (APPs -  Physician Assistants and Nurse Practitioners) who all work together to provide you with the care you need, when you need it.  We recommend signing up for the patient portal called "MyChart".  Sign up information is provided on this After Visit Summary.  MyChart is used to connect with patients for Virtual Visits (Telemedicine).  Patients are able to view lab/test results, encounter notes, upcoming appointments, etc.  Non-urgent messages can be sent to your provider as well.   To learn more about what you can do with MyChart, go to [email or web address].    Your next  appointment:   1-2 week(s)  The format for your next appointment:   In Person  Provider:   Mertie Moores, MD or Melina Copa, PA-C   Other Instructions Your blood pressure is running high today. We need an idea of how it's running at home. I would recommend using a blood pressure cuff that goes on your arm. The wrist ones can be inaccurate. If possible, try to select one that also reports your heart rate. To check your blood pressure, choose a time at least 3 hours after taking your blood pressure medicines. If you can sample it at different times of the day, that's great - it might give you more information about how your blood pressure fluctuates. Remain seated in a chair for 5 minutes quietly beforehand, then check it. Please record a list of those readings and call us/send in MyChart message with them for our review on Monday 03/08/20.     Echocardiogram An echocardiogram is a procedure that uses painless sound waves (ultrasound) to produce an image of the heart. Images from an echocardiogram can provide important information about:  Signs of coronary artery disease (CAD).  Aneurysm detection. An aneurysm is a weak or damaged part of an artery wall that bulges out from the normal force of blood pumping through the body.  Heart size and shape. Changes in the size or shape of the heart can be associated with certain conditions, including heart failure, aneurysm, and CAD.  Heart muscle function.  Heart valve function.  Signs of a past heart attack.  Fluid buildup  around the heart.  Thickening of the heart muscle.  A tumor or infectious growth around the heart valves. Tell a health care provider about:  Any allergies you have.  All medicines you are taking, including vitamins, herbs, eye drops, creams, and over-the-counter medicines.  Any blood disorders you have.  Any surgeries you have had.  Any medical conditions you have.  Whether you are pregnant or may be  pregnant. What are the risks? Generally, this is a safe procedure. However, problems may occur, including:  Allergic reaction to dye (contrast) that may be used during the procedure. What happens before the procedure? No specific preparation is needed. You may eat and drink normally. What happens during the procedure?   An IV tube may be inserted into one of your veins.  You may receive contrast through this tube. A contrast is an injection that improves the quality of the pictures from your heart.  A gel will be applied to your chest.  A wand-like tool (transducer) will be moved over your chest. The gel will help to transmit the sound waves from the transducer.  The sound waves will harmlessly bounce off of your heart to allow the heart images to be captured in real-time motion. The images will be recorded on a computer. The procedure may vary among health care providers and hospitals. What happens after the procedure?  You may return to your normal, everyday life, including diet, activities, and medicines, unless your health care provider tells you not to do that. Summary  An echocardiogram is a procedure that uses painless sound waves (ultrasound) to produce an image of the heart.  Images from an echocardiogram can provide important information about the size and shape of your heart, heart muscle function, heart valve function, and fluid buildup around your heart.  You do not need to do anything to prepare before this procedure. You may eat and drink normally.  After the echocardiogram is completed, you may return to your normal, everyday life, unless your health care provider tells you not to do that. This information is not intended to replace advice given to you by your health care provider. Make sure you discuss any questions you have with your health care provider. Document Revised: 06/06/2018 Document Reviewed: 03/18/2016 Elsevier Patient Education  [place].

## 2020-03-06 DIAGNOSIS — E876 Hypokalemia: Secondary | ICD-10-CM | POA: Diagnosis not present

## 2020-03-06 DIAGNOSIS — Z992 Dependence on renal dialysis: Secondary | ICD-10-CM | POA: Diagnosis not present

## 2020-03-06 DIAGNOSIS — N2581 Secondary hyperparathyroidism of renal origin: Secondary | ICD-10-CM | POA: Diagnosis not present

## 2020-03-06 DIAGNOSIS — D631 Anemia in chronic kidney disease: Secondary | ICD-10-CM | POA: Diagnosis not present

## 2020-03-06 DIAGNOSIS — N186 End stage renal disease: Secondary | ICD-10-CM | POA: Diagnosis not present

## 2020-03-06 LAB — CBC
Hematocrit: 33.2 % — ABNORMAL LOW (ref 34.0–46.6)
Hemoglobin: 11.3 g/dL (ref 11.1–15.9)
MCH: 31.1 pg (ref 26.6–33.0)
MCHC: 34 g/dL (ref 31.5–35.7)
MCV: 92 fL (ref 79–97)
Platelets: 175 10*3/uL (ref 150–450)
RBC: 3.63 x10E6/uL — ABNORMAL LOW (ref 3.77–5.28)
RDW: 15.1 % (ref 11.7–15.4)
WBC: 6.7 10*3/uL (ref 3.4–10.8)

## 2020-03-06 LAB — COMPREHENSIVE METABOLIC PANEL
ALT: 10 IU/L (ref 0–32)
AST: 20 IU/L (ref 0–40)
Albumin/Globulin Ratio: 1.2 (ref 1.2–2.2)
Albumin: 3.8 g/dL (ref 3.8–4.8)
Alkaline Phosphatase: 243 IU/L — ABNORMAL HIGH (ref 44–121)
BUN/Creatinine Ratio: 7 — ABNORMAL LOW (ref 9–23)
BUN: 82 mg/dL (ref 6–24)
Bilirubin Total: 0.3 mg/dL (ref 0.0–1.2)
CO2: 25 mmol/L (ref 20–29)
Calcium: 10.1 mg/dL (ref 8.7–10.2)
Chloride: 99 mmol/L (ref 96–106)
Creatinine, Ser: 12.49 mg/dL — ABNORMAL HIGH (ref 0.57–1.00)
GFR calc Af Amer: 4 mL/min/{1.73_m2} — ABNORMAL LOW (ref >59)
GFR calc non Af Amer: 3 mL/min/{1.73_m2} — ABNORMAL LOW (ref >59)
Globulin, Total: 3.1 g/dL (ref 1.5–4.5)
Glucose: 90 mg/dL (ref 65–99)
Potassium: 4.7 mmol/L (ref 3.5–5.2)
Sodium: 140 mmol/L (ref 134–144)
Total Protein: 6.9 g/dL (ref 6.0–8.5)

## 2020-03-06 LAB — TSH: TSH: 2.25 u[IU]/mL (ref 0.450–4.500)

## 2020-03-08 ENCOUNTER — Telehealth: Payer: Self-pay | Admitting: Licensed Clinical Social Worker

## 2020-03-08 NOTE — Telephone Encounter (Signed)
CSW referred to assist patient with obtaining a BP cuff. CSW contacted patient to inform cuff will be delivered to home. Patient grateful for support and assistance. CSW available as needed. Jackie Josiel Gahm, LCSW, CCSW-MCS [phone]  

## 2020-03-09 DIAGNOSIS — N2581 Secondary hyperparathyroidism of renal origin: Secondary | ICD-10-CM | POA: Diagnosis not present

## 2020-03-09 DIAGNOSIS — D631 Anemia in chronic kidney disease: Secondary | ICD-10-CM | POA: Diagnosis not present

## 2020-03-09 DIAGNOSIS — N186 End stage renal disease: Secondary | ICD-10-CM | POA: Diagnosis not present

## 2020-03-09 DIAGNOSIS — E876 Hypokalemia: Secondary | ICD-10-CM | POA: Diagnosis not present

## 2020-03-09 DIAGNOSIS — Z992 Dependence on renal dialysis: Secondary | ICD-10-CM | POA: Diagnosis not present

## 2020-03-11 DIAGNOSIS — Z20822 Contact with and (suspected) exposure to covid-19: Secondary | ICD-10-CM | POA: Diagnosis not present

## 2020-03-11 DIAGNOSIS — I10 Essential (primary) hypertension: Secondary | ICD-10-CM | POA: Diagnosis not present

## 2020-03-11 DIAGNOSIS — N186 End stage renal disease: Secondary | ICD-10-CM | POA: Diagnosis not present

## 2020-03-11 DIAGNOSIS — M1990 Unspecified osteoarthritis, unspecified site: Secondary | ICD-10-CM | POA: Diagnosis not present

## 2020-03-11 DIAGNOSIS — Z992 Dependence on renal dialysis: Secondary | ICD-10-CM | POA: Diagnosis not present

## 2020-03-11 DIAGNOSIS — Z9189 Other specified personal risk factors, not elsewhere classified: Secondary | ICD-10-CM | POA: Diagnosis not present

## 2020-03-11 DIAGNOSIS — E782 Mixed hyperlipidemia: Secondary | ICD-10-CM | POA: Diagnosis not present

## 2020-03-11 DIAGNOSIS — Z794 Long term (current) use of insulin: Secondary | ICD-10-CM | POA: Diagnosis not present

## 2020-03-11 DIAGNOSIS — E663 Overweight: Secondary | ICD-10-CM | POA: Diagnosis not present

## 2020-03-11 DIAGNOSIS — G629 Polyneuropathy, unspecified: Secondary | ICD-10-CM | POA: Diagnosis not present

## 2020-03-11 DIAGNOSIS — E1122 Type 2 diabetes mellitus with diabetic chronic kidney disease: Secondary | ICD-10-CM | POA: Diagnosis not present

## 2020-03-13 DIAGNOSIS — D631 Anemia in chronic kidney disease: Secondary | ICD-10-CM | POA: Diagnosis not present

## 2020-03-13 DIAGNOSIS — E876 Hypokalemia: Secondary | ICD-10-CM | POA: Diagnosis not present

## 2020-03-13 DIAGNOSIS — Z992 Dependence on renal dialysis: Secondary | ICD-10-CM | POA: Diagnosis not present

## 2020-03-13 DIAGNOSIS — N186 End stage renal disease: Secondary | ICD-10-CM | POA: Diagnosis not present

## 2020-03-13 DIAGNOSIS — N2581 Secondary hyperparathyroidism of renal origin: Secondary | ICD-10-CM | POA: Diagnosis not present

## 2020-03-16 DIAGNOSIS — Z992 Dependence on renal dialysis: Secondary | ICD-10-CM | POA: Diagnosis not present

## 2020-03-16 DIAGNOSIS — D631 Anemia in chronic kidney disease: Secondary | ICD-10-CM | POA: Diagnosis not present

## 2020-03-16 DIAGNOSIS — N186 End stage renal disease: Secondary | ICD-10-CM | POA: Diagnosis not present

## 2020-03-16 DIAGNOSIS — E876 Hypokalemia: Secondary | ICD-10-CM | POA: Diagnosis not present

## 2020-03-16 DIAGNOSIS — N2581 Secondary hyperparathyroidism of renal origin: Secondary | ICD-10-CM | POA: Diagnosis not present

## 2020-03-18 DIAGNOSIS — Z992 Dependence on renal dialysis: Secondary | ICD-10-CM | POA: Diagnosis not present

## 2020-03-18 DIAGNOSIS — N186 End stage renal disease: Secondary | ICD-10-CM | POA: Diagnosis not present

## 2020-03-18 DIAGNOSIS — E876 Hypokalemia: Secondary | ICD-10-CM | POA: Diagnosis not present

## 2020-03-18 DIAGNOSIS — N2581 Secondary hyperparathyroidism of renal origin: Secondary | ICD-10-CM | POA: Diagnosis not present

## 2020-03-18 DIAGNOSIS — D631 Anemia in chronic kidney disease: Secondary | ICD-10-CM | POA: Diagnosis not present

## 2020-03-20 DIAGNOSIS — E876 Hypokalemia: Secondary | ICD-10-CM | POA: Diagnosis not present

## 2020-03-20 DIAGNOSIS — D631 Anemia in chronic kidney disease: Secondary | ICD-10-CM | POA: Diagnosis not present

## 2020-03-20 DIAGNOSIS — N186 End stage renal disease: Secondary | ICD-10-CM | POA: Diagnosis not present

## 2020-03-20 DIAGNOSIS — N2581 Secondary hyperparathyroidism of renal origin: Secondary | ICD-10-CM | POA: Diagnosis not present

## 2020-03-20 DIAGNOSIS — Z992 Dependence on renal dialysis: Secondary | ICD-10-CM | POA: Diagnosis not present

## 2020-03-23 ENCOUNTER — Ambulatory Visit: Payer: Medicare HMO

## 2020-03-23 DIAGNOSIS — N186 End stage renal disease: Secondary | ICD-10-CM | POA: Diagnosis not present

## 2020-03-23 DIAGNOSIS — D631 Anemia in chronic kidney disease: Secondary | ICD-10-CM | POA: Diagnosis not present

## 2020-03-23 DIAGNOSIS — N2581 Secondary hyperparathyroidism of renal origin: Secondary | ICD-10-CM | POA: Diagnosis not present

## 2020-03-23 DIAGNOSIS — Z992 Dependence on renal dialysis: Secondary | ICD-10-CM | POA: Diagnosis not present

## 2020-03-23 DIAGNOSIS — E876 Hypokalemia: Secondary | ICD-10-CM | POA: Diagnosis not present

## 2020-03-24 DIAGNOSIS — Z992 Dependence on renal dialysis: Secondary | ICD-10-CM | POA: Diagnosis not present

## 2020-03-24 DIAGNOSIS — T82858A Stenosis of vascular prosthetic devices, implants and grafts, initial encounter: Secondary | ICD-10-CM | POA: Diagnosis not present

## 2020-03-24 DIAGNOSIS — N186 End stage renal disease: Secondary | ICD-10-CM | POA: Diagnosis not present

## 2020-03-24 DIAGNOSIS — I871 Compression of vein: Secondary | ICD-10-CM | POA: Diagnosis not present

## 2020-03-25 DIAGNOSIS — E876 Hypokalemia: Secondary | ICD-10-CM | POA: Diagnosis not present

## 2020-03-25 DIAGNOSIS — N2581 Secondary hyperparathyroidism of renal origin: Secondary | ICD-10-CM | POA: Diagnosis not present

## 2020-03-25 DIAGNOSIS — D631 Anemia in chronic kidney disease: Secondary | ICD-10-CM | POA: Diagnosis not present

## 2020-03-25 DIAGNOSIS — Z992 Dependence on renal dialysis: Secondary | ICD-10-CM | POA: Diagnosis not present

## 2020-03-25 DIAGNOSIS — N186 End stage renal disease: Secondary | ICD-10-CM | POA: Diagnosis not present

## 2020-03-27 DIAGNOSIS — N2581 Secondary hyperparathyroidism of renal origin: Secondary | ICD-10-CM | POA: Diagnosis not present

## 2020-03-27 DIAGNOSIS — D631 Anemia in chronic kidney disease: Secondary | ICD-10-CM | POA: Diagnosis not present

## 2020-03-27 DIAGNOSIS — N186 End stage renal disease: Secondary | ICD-10-CM | POA: Diagnosis not present

## 2020-03-27 DIAGNOSIS — Z992 Dependence on renal dialysis: Secondary | ICD-10-CM | POA: Diagnosis not present

## 2020-03-27 DIAGNOSIS — E876 Hypokalemia: Secondary | ICD-10-CM | POA: Diagnosis not present

## 2020-03-29 ENCOUNTER — Encounter (HOSPITAL_COMMUNITY): Payer: Self-pay | Admitting: Cardiology

## 2020-03-29 ENCOUNTER — Other Ambulatory Visit (HOSPITAL_COMMUNITY): Payer: Medicare HMO

## 2020-03-29 DIAGNOSIS — Z992 Dependence on renal dialysis: Secondary | ICD-10-CM | POA: Diagnosis not present

## 2020-03-29 DIAGNOSIS — N186 End stage renal disease: Secondary | ICD-10-CM | POA: Diagnosis not present

## 2020-03-29 DIAGNOSIS — M3214 Glomerular disease in systemic lupus erythematosus: Secondary | ICD-10-CM | POA: Diagnosis not present

## 2020-03-29 NOTE — Progress Notes (Deleted)
Cardiology Office Note    Date:  03/29/2020   ID:  [name], Hashimi Paula Bass Bass, MRN [id number]  PCP:  Paula Bass Mccreedy, MD  Cardiologist:  Paula Bass Moores, MD  Electrophysiologist:  None   Chief Complaint: f/u chest pain   History of Present Illness:   Paula Bass Bass is a 50 y.o. female with history of CAD, ESRD on HD, lupus, HTN, DM, HLD, prior GI bleed, chronic appearing anemia who is seen back post-cath. She has history of CAD with PCI to [facility] in 2017 in [place]. In 03/2018 she had a repeat cath with 80-90% mid/distal LCx s/p synergy DES, with residual RCA stenosis treated medically. Her procedure was c/b a large R groin hematoma and ABL anemia requiring 2 U PRBCs. CT was neg for retroperitoneal hematoma. In 04/2018 and 10/2018 she was still having some residual chest pain with mixed features. This was managed medically with titration of nitrate. From review of notes it appears that during the during majority of OVs she has had some degree of chronic chest pain, except for 01/2019 virtual visit after she had improved her diet and avoided processed foods. Nuclear stress test in 01/2019 was normal without ischemia.   She recently presented back to the office 01/2020 for cardiac clearance for kidney transplant. She was reporting exertional chest pain concerning for angina. Imdur dose was increased. She underwent cardiac cath 02/13/20 demonstrating findings below with resultant orbital atherectomy/DES to the OM1 and OM2. Paula Bass Bass, interventional cardiologist, favored medical management of the residual RCA disease, to reconsider consider staged orbital atherectomy/PCI for residual symptoms. Discharge summary indicated recommendation to continue DAPT indefinitely with aspirin and Plavix. When seen back in follow-up 03/05/20, she was continuing to have episodes of chest pain with mixed features. See note for details. She unfortunately has not been taking her isosorbide reliably because she  states she went to pick them up at the pharmacy and they told her they didn't have it. She had also missed some doses of atorvastatin. BP was also elevated in the context of salty food. I spoke with Dr. Acie Bass and we recommended to continue to intensify medical therapy, reserving PCI for refractory symptoms.  Lipids plan    CAD ESRD on HD Essential HTN Hyperlipidemia  Labwork independently reviewed: 03/05/20 Hgb 11.3, K 4.7, LFTs ok except alk phos (instructed to f/u PCP/renal for this), TSH wnl   Past Medical History:  Diagnosis Date  . Acute diverticulitis 12/01/2017  . Anemia   . CAD in native artery    a. PCI to [place] in 2017 in [place]. b.  In 03/2018 she had a repeat cath with 80-90% mid/distal LCx s/p synergy DES, with residual RCA stenosis mg'd medically. c.01/2020 s/p orbital atherectomy/DES to the OM1 and OM2, residual RCA stenosis.  . Diabetes mellitus without complication ([place])   . Dialysis patient ([place])   . Diverticulitis   . ESRD (end stage renal disease) ([place])   . GI bleeding   . Hypertension   . LGI bleed 12/16/2017  . Lower GI bleed   . Lupus ([place])   . Renal disorder    dialysis  . UTI (urinary tract infection) 12/01/2017    Past Surgical History:  Procedure Laterality Date  . BIOPSY  10/24/2019   Procedure: BIOPSY;  Surgeon: Paula Brace, MD;  [facility]: [facility];  Service: Gastroenterology;;  EGD and COLON  . CARDIAC SURGERY     cardiac shunt  . COLONOSCOPY WITH PROPOFOL N/A 10/24/2019   Procedure: COLONOSCOPY  WITH PROPOFOL;  Surgeon: Paula Brace, MD;  [facility];  Service: Gastroenterology;  Laterality: N/A;  . CORONARY ATHERECTOMY N/A 02/13/2020   Procedure: CORONARY ATHERECTOMY;  Surgeon: Paula Bass Bush, MD;  Location: [place] [facility];  Service: Cardiovascular;  Laterality: N/A;  . CORONARY STENT INTERVENTION N/A 04/19/2018   Procedure: CORONARY STENT INTERVENTION;  Surgeon: Paula Bass Bush, MD;  Location: [place] [facility];   Service: Cardiovascular;  Laterality: N/A;  . CORONARY STENT INTERVENTION N/A 02/13/2020   Procedure: CORONARY STENT INTERVENTION;  Surgeon: Paula Bass Bush, MD;  Location: [place] [facility];  Service: Cardiovascular;  Laterality: N/A;  . ESOPHAGOGASTRODUODENOSCOPY (EGD) WITH PROPOFOL N/A 10/24/2019   Procedure: ESOPHAGOGASTRODUODENOSCOPY (EGD) WITH PROPOFOL;  Surgeon: Paula Brace, MD;  [facility];  Service: Gastroenterology;  Laterality: N/A;  . INCISION AND DRAINAGE PERIRECTAL ABSCESS N/A 07/09/2019   Procedure: IRRIGATION AND DEBRIDEMEN right buttock ABSCESS;  Surgeon: Paula Bass Bass, Paula Bruce, MD;  Location: [place];  Service: General;  Laterality: N/A;  . INTRAVASCULAR PRESSURE WIRE/FFR STUDY N/A 02/13/2020   Procedure: INTRAVASCULAR PRESSURE WIRE/FFR STUDY;  Surgeon: Paula Bass Bush, MD;  Location: [place] [facility];  Service: Cardiovascular;  Laterality: N/A;  . Kidney graft Left   . LEFT HEART CATH AND CORONARY ANGIOGRAPHY N/A 04/19/2018   Procedure: LEFT HEART CATH AND CORONARY ANGIOGRAPHY;  Surgeon: Paula Bass Bush, MD;  Location: [place] [facility];  Service: Cardiovascular;  Laterality: N/A;  . LEFT HEART CATH AND CORONARY ANGIOGRAPHY N/A 02/13/2020   Procedure: LEFT HEART CATH AND CORONARY ANGIOGRAPHY;  Surgeon: Paula Bass Bush, MD;  Location: [place] [facility];  Service: Cardiovascular;  Laterality: N/A;  . POLYPECTOMY  10/24/2019   Procedure: POLYPECTOMY;  Surgeon: Paula Brace, MD;  [facility];  Service: Gastroenterology;;    Current Medications: No outpatient medications have been marked as taking for the 04/01/20 encounter (Appointment) with Paula Bass Pitter, PA-C.   ***   Allergies:   Penicillins   Social History   Socioeconomic History  . Marital status: Single    Spouse name: Not on file  . Number of children: 1  . Years of education: Not on file  . Highest education level: Not on file  Occupational History  . Occupation: disabled   Tobacco Use  . Smoking status: Never Smoker  . Smokeless tobacco: Never Used  Vaping Use  . Vaping Use: Never used  Substance and Sexual Activity  . Alcohol use: Never  . Drug use: Never  . Sexual activity: Yes    Partners: Male    Birth control/protection: Condom  Other Topics Concern  . Not on file  Social History Narrative  . Not on file   Social Determinants of Health   Financial Resource Strain: Not on file  Food Insecurity: Not on file  Transportation Needs: Not on file  Physical Activity: Not on file  Stress: Not on file  Social Connections: Not on file     Family History:  The patient's ***family history includes Healthy in her brother and brother; Hypertension in her mother; Other in her father. There is no history of Colon cancer, Esophageal cancer, or Breast cancer.  ROS:   Please see the history of present illness. Otherwise, review of systems is positive for ***.  All other systems are reviewed and otherwise negative.    EKGs/Labs/Other Studies Reviewed:    Studies reviewed are outlined and summarized above. Reports included below if pertinent.  Cardiac Cath 02/13/20 Conclusions: 1. Severe two-vessel coronary artery disease including multifocal  disease involving the LCx/OM's (70% proximal OM1, 99% proximal OM2, and 60% mid/distal LCx at the takeoff of OM2) as well as serial 99% and 70% mid RCA lesions. 2. Moderate, nonobstructive proximal/mid LAD disease that is not hemodynamically significant (FFR 0.91). 3. Patent proximal RCA and distal LCx stents. 4. Successful orbital atherectomy/PCI to OM1 using Resolute Onyx 3.0 x 26 mm drug-eluting stent with 0% residual stenosis and TIMI-3 flow. 5. Successful orbital atherectomy/PCI to OM 2 using Resolute Onyx 2.25 x 8 mm drug-eluting stent with 0% residual stenosis and TIMI-3 flow.  Recommendations: 1. Overnight extended recovery. Nephrology notified to facilitate dialysis tomorrow prior to discharge or delay  outpatient dialysis till later tomorrow. 2. Continue indefinite dual antiplatelet therapy with aspirin and clopidogrel. 3. Aggressive secondary prevention. 4. Favor medical management of RCA. If she continues to have some angina, orbital atherectomy/PCI could be considered in a staged fashion.  Paula Bass Bush, MD [facility]  2D Echo 01/2019 1. Left ventricular ejection fraction, by visual estimation, is 60 to  65%. The left ventricle has normal function. There is mildly increased  left ventricular hypertrophy.  2. The left ventricle has no regional wall motion abnormalities.  3. Global right ventricle has normal systolic function.The right  ventricular size is normal.  4. Left atrial size was normal.  5. Right atrial size was normal.  6. The mitral valve is normal in structure. Mild mitral valve  regurgitation.  7. The tricuspid valve is normal in structure. Tricuspid valve  regurgitation is not demonstrated.  8. The aortic valve is tricuspid. Aortic valve regurgitation is not  visualized. No evidence of aortic valve stenosis.  9. The pulmonic valve was not well visualized. Pulmonic valve  regurgitation is trivial.  10. TR signal is inadequate for assessing pulmonary artery systolic  pressure.  11. The inferior vena cava is normal in size with greater than 50%  respiratory variability, suggesting right atrial pressure of 3 mmHg.      EKG:  EKG is ordered today, personally reviewed, demonstrating ***  Recent Labs: 03/05/2020: ALT 10; BUN 82; Creatinine, Ser 12.49; Hemoglobin 11.3; Platelets 175; Potassium 4.7; Sodium 140; TSH 2.250  Recent Lipid Panel    Component Value Date/Time   CHOL 120 04/15/2018 0948   TRIG 92 04/15/2018 0948   HDL 59 04/15/2018 0948   CHOLHDL 2.0 04/15/2018 0948   LDLCALC 43 04/15/2018 0948    PHYSICAL EXAM:    VS:  LMP 02/16/2018 (Approximate)   BMI: There is no height or weight on file to calculate BMI.  GEN: Well nourished,  well developed, in no acute distress HEENT: normocephalic, atraumatic Neck: no JVD, carotid bruits, or masses Cardiac: ***RRR; no murmurs, rubs, or gallops, no edema  Respiratory:  clear to auscultation bilaterally, normal work of breathing GI: soft, nontender, nondistended, + BS MS: no deformity or atrophy Skin: warm and dry, no rash Neuro:  Alert and Oriented x 3, Strength and sensation are intact, follows commands Psych: euthymic mood, full affect  Wt Readings from Last 3 Encounters:  03/05/20 142 lb 9.6 oz (64.7 kg)  02/14/20 137 lb 2 oz (62.2 kg)  02/09/20 142 lb 6.4 oz (64.6 kg)     ASSESSMENT & PLAN:   1. ***  Disposition: F/u with ***   Medication Adjustments/Labs and Tests Ordered: Current medicines are reviewed at length with the patient today.  Concerns regarding medicines are outlined above. Medication changes, Labs and Tests ordered today are summarized above and listed in the Patient Instructions  accessible in Encounters.   Signed, Paula Bass Pitter, PA-C  03/29/2020 11:27 AM    [facility] [address], [place], [place]  [address] Phone: [phone]; Fax: [phone]

## 2020-03-29 NOTE — Progress Notes (Unsigned)
Patient ID: Paula Bass, female   DOB: [birth year], 50 y.o.   MRN: [id number]   Verified appointment "no show" status with Renetta at 11:46am.

## 2020-03-30 DIAGNOSIS — E876 Hypokalemia: Secondary | ICD-10-CM | POA: Diagnosis not present

## 2020-03-30 DIAGNOSIS — N2581 Secondary hyperparathyroidism of renal origin: Secondary | ICD-10-CM | POA: Diagnosis not present

## 2020-03-30 DIAGNOSIS — D631 Anemia in chronic kidney disease: Secondary | ICD-10-CM | POA: Diagnosis not present

## 2020-03-30 DIAGNOSIS — Z992 Dependence on renal dialysis: Secondary | ICD-10-CM | POA: Diagnosis not present

## 2020-03-30 DIAGNOSIS — Z23 Encounter for immunization: Secondary | ICD-10-CM | POA: Diagnosis not present

## 2020-03-30 DIAGNOSIS — N186 End stage renal disease: Secondary | ICD-10-CM | POA: Diagnosis not present

## 2020-03-31 ENCOUNTER — Ambulatory Visit: Payer: Medicare HMO

## 2020-04-01 ENCOUNTER — Ambulatory Visit: Payer: Medicare HMO | Admitting: Physician Assistant

## 2020-04-01 ENCOUNTER — Other Ambulatory Visit (HOSPITAL_COMMUNITY): Payer: Medicare HMO

## 2020-04-01 DIAGNOSIS — E876 Hypokalemia: Secondary | ICD-10-CM | POA: Diagnosis not present

## 2020-04-01 DIAGNOSIS — Z992 Dependence on renal dialysis: Secondary | ICD-10-CM | POA: Diagnosis not present

## 2020-04-01 DIAGNOSIS — N2581 Secondary hyperparathyroidism of renal origin: Secondary | ICD-10-CM | POA: Diagnosis not present

## 2020-04-01 DIAGNOSIS — Z23 Encounter for immunization: Secondary | ICD-10-CM | POA: Diagnosis not present

## 2020-04-01 DIAGNOSIS — D631 Anemia in chronic kidney disease: Secondary | ICD-10-CM | POA: Diagnosis not present

## 2020-04-01 DIAGNOSIS — N186 End stage renal disease: Secondary | ICD-10-CM | POA: Diagnosis not present

## 2020-04-02 DIAGNOSIS — R5383 Other fatigue: Secondary | ICD-10-CM | POA: Diagnosis not present

## 2020-04-02 DIAGNOSIS — D689 Coagulation defect, unspecified: Secondary | ICD-10-CM | POA: Diagnosis not present

## 2020-04-03 DIAGNOSIS — Z23 Encounter for immunization: Secondary | ICD-10-CM | POA: Diagnosis not present

## 2020-04-03 DIAGNOSIS — D631 Anemia in chronic kidney disease: Secondary | ICD-10-CM | POA: Diagnosis not present

## 2020-04-03 DIAGNOSIS — E876 Hypokalemia: Secondary | ICD-10-CM | POA: Diagnosis not present

## 2020-04-03 DIAGNOSIS — N186 End stage renal disease: Secondary | ICD-10-CM | POA: Diagnosis not present

## 2020-04-03 DIAGNOSIS — Z992 Dependence on renal dialysis: Secondary | ICD-10-CM | POA: Diagnosis not present

## 2020-04-03 DIAGNOSIS — N2581 Secondary hyperparathyroidism of renal origin: Secondary | ICD-10-CM | POA: Diagnosis not present

## 2020-04-06 DIAGNOSIS — Z992 Dependence on renal dialysis: Secondary | ICD-10-CM | POA: Diagnosis not present

## 2020-04-06 DIAGNOSIS — D631 Anemia in chronic kidney disease: Secondary | ICD-10-CM | POA: Diagnosis not present

## 2020-04-06 DIAGNOSIS — N2581 Secondary hyperparathyroidism of renal origin: Secondary | ICD-10-CM | POA: Diagnosis not present

## 2020-04-06 DIAGNOSIS — Z23 Encounter for immunization: Secondary | ICD-10-CM | POA: Diagnosis not present

## 2020-04-06 DIAGNOSIS — E876 Hypokalemia: Secondary | ICD-10-CM | POA: Diagnosis not present

## 2020-04-06 DIAGNOSIS — N186 End stage renal disease: Secondary | ICD-10-CM | POA: Diagnosis not present

## 2020-04-08 DIAGNOSIS — N186 End stage renal disease: Secondary | ICD-10-CM | POA: Diagnosis not present

## 2020-04-08 DIAGNOSIS — N2581 Secondary hyperparathyroidism of renal origin: Secondary | ICD-10-CM | POA: Diagnosis not present

## 2020-04-08 DIAGNOSIS — Z23 Encounter for immunization: Secondary | ICD-10-CM | POA: Diagnosis not present

## 2020-04-08 DIAGNOSIS — Z992 Dependence on renal dialysis: Secondary | ICD-10-CM | POA: Diagnosis not present

## 2020-04-08 DIAGNOSIS — D631 Anemia in chronic kidney disease: Secondary | ICD-10-CM | POA: Diagnosis not present

## 2020-04-08 DIAGNOSIS — E876 Hypokalemia: Secondary | ICD-10-CM | POA: Diagnosis not present

## 2020-04-10 DIAGNOSIS — N186 End stage renal disease: Secondary | ICD-10-CM | POA: Diagnosis not present

## 2020-04-10 DIAGNOSIS — E876 Hypokalemia: Secondary | ICD-10-CM | POA: Diagnosis not present

## 2020-04-10 DIAGNOSIS — Z23 Encounter for immunization: Secondary | ICD-10-CM | POA: Diagnosis not present

## 2020-04-10 DIAGNOSIS — D631 Anemia in chronic kidney disease: Secondary | ICD-10-CM | POA: Diagnosis not present

## 2020-04-10 DIAGNOSIS — Z992 Dependence on renal dialysis: Secondary | ICD-10-CM | POA: Diagnosis not present

## 2020-04-10 DIAGNOSIS — N2581 Secondary hyperparathyroidism of renal origin: Secondary | ICD-10-CM | POA: Diagnosis not present

## 2020-04-12 DIAGNOSIS — R69 Illness, unspecified: Secondary | ICD-10-CM | POA: Diagnosis not present

## 2020-04-12 DIAGNOSIS — B9689 Other specified bacterial agents as the cause of diseases classified elsewhere: Secondary | ICD-10-CM | POA: Diagnosis not present

## 2020-04-12 DIAGNOSIS — N898 Other specified noninflammatory disorders of vagina: Secondary | ICD-10-CM | POA: Diagnosis not present

## 2020-04-12 DIAGNOSIS — N760 Acute vaginitis: Secondary | ICD-10-CM | POA: Diagnosis not present

## 2020-04-13 DIAGNOSIS — Z992 Dependence on renal dialysis: Secondary | ICD-10-CM | POA: Diagnosis not present

## 2020-04-13 DIAGNOSIS — D631 Anemia in chronic kidney disease: Secondary | ICD-10-CM | POA: Diagnosis not present

## 2020-04-13 DIAGNOSIS — N186 End stage renal disease: Secondary | ICD-10-CM | POA: Diagnosis not present

## 2020-04-13 DIAGNOSIS — E876 Hypokalemia: Secondary | ICD-10-CM | POA: Diagnosis not present

## 2020-04-13 DIAGNOSIS — N2581 Secondary hyperparathyroidism of renal origin: Secondary | ICD-10-CM | POA: Diagnosis not present

## 2020-04-13 DIAGNOSIS — Z23 Encounter for immunization: Secondary | ICD-10-CM | POA: Diagnosis not present

## 2020-04-15 DIAGNOSIS — E876 Hypokalemia: Secondary | ICD-10-CM | POA: Diagnosis not present

## 2020-04-15 DIAGNOSIS — D631 Anemia in chronic kidney disease: Secondary | ICD-10-CM | POA: Diagnosis not present

## 2020-04-15 DIAGNOSIS — N2581 Secondary hyperparathyroidism of renal origin: Secondary | ICD-10-CM | POA: Diagnosis not present

## 2020-04-15 DIAGNOSIS — N186 End stage renal disease: Secondary | ICD-10-CM | POA: Diagnosis not present

## 2020-04-15 DIAGNOSIS — Z23 Encounter for immunization: Secondary | ICD-10-CM | POA: Diagnosis not present

## 2020-04-15 DIAGNOSIS — Z992 Dependence on renal dialysis: Secondary | ICD-10-CM | POA: Diagnosis not present

## 2020-04-17 DIAGNOSIS — N186 End stage renal disease: Secondary | ICD-10-CM | POA: Diagnosis not present

## 2020-04-17 DIAGNOSIS — D631 Anemia in chronic kidney disease: Secondary | ICD-10-CM | POA: Diagnosis not present

## 2020-04-17 DIAGNOSIS — E876 Hypokalemia: Secondary | ICD-10-CM | POA: Diagnosis not present

## 2020-04-17 DIAGNOSIS — N2581 Secondary hyperparathyroidism of renal origin: Secondary | ICD-10-CM | POA: Diagnosis not present

## 2020-04-17 DIAGNOSIS — Z23 Encounter for immunization: Secondary | ICD-10-CM | POA: Diagnosis not present

## 2020-04-17 DIAGNOSIS — Z992 Dependence on renal dialysis: Secondary | ICD-10-CM | POA: Diagnosis not present

## 2020-04-19 ENCOUNTER — Other Ambulatory Visit (HOSPITAL_COMMUNITY): Payer: Medicare HMO

## 2020-04-20 DIAGNOSIS — N186 End stage renal disease: Secondary | ICD-10-CM | POA: Diagnosis not present

## 2020-04-20 DIAGNOSIS — D631 Anemia in chronic kidney disease: Secondary | ICD-10-CM | POA: Diagnosis not present

## 2020-04-20 DIAGNOSIS — E876 Hypokalemia: Secondary | ICD-10-CM | POA: Diagnosis not present

## 2020-04-20 DIAGNOSIS — N2581 Secondary hyperparathyroidism of renal origin: Secondary | ICD-10-CM | POA: Diagnosis not present

## 2020-04-20 DIAGNOSIS — Z23 Encounter for immunization: Secondary | ICD-10-CM | POA: Diagnosis not present

## 2020-04-20 DIAGNOSIS — Z992 Dependence on renal dialysis: Secondary | ICD-10-CM | POA: Diagnosis not present

## 2020-04-21 ENCOUNTER — Other Ambulatory Visit: Payer: Medicare HMO

## 2020-04-22 DIAGNOSIS — E876 Hypokalemia: Secondary | ICD-10-CM | POA: Diagnosis not present

## 2020-04-22 DIAGNOSIS — N186 End stage renal disease: Secondary | ICD-10-CM | POA: Diagnosis not present

## 2020-04-22 DIAGNOSIS — N2581 Secondary hyperparathyroidism of renal origin: Secondary | ICD-10-CM | POA: Diagnosis not present

## 2020-04-22 DIAGNOSIS — Z992 Dependence on renal dialysis: Secondary | ICD-10-CM | POA: Diagnosis not present

## 2020-04-22 DIAGNOSIS — D631 Anemia in chronic kidney disease: Secondary | ICD-10-CM | POA: Diagnosis not present

## 2020-04-22 DIAGNOSIS — Z23 Encounter for immunization: Secondary | ICD-10-CM | POA: Diagnosis not present

## 2020-04-24 DIAGNOSIS — N2581 Secondary hyperparathyroidism of renal origin: Secondary | ICD-10-CM | POA: Diagnosis not present

## 2020-04-24 DIAGNOSIS — Z23 Encounter for immunization: Secondary | ICD-10-CM | POA: Diagnosis not present

## 2020-04-24 DIAGNOSIS — Z992 Dependence on renal dialysis: Secondary | ICD-10-CM | POA: Diagnosis not present

## 2020-04-24 DIAGNOSIS — E876 Hypokalemia: Secondary | ICD-10-CM | POA: Diagnosis not present

## 2020-04-24 DIAGNOSIS — D631 Anemia in chronic kidney disease: Secondary | ICD-10-CM | POA: Diagnosis not present

## 2020-04-24 DIAGNOSIS — N186 End stage renal disease: Secondary | ICD-10-CM | POA: Diagnosis not present

## 2020-04-26 ENCOUNTER — Ambulatory Visit: Payer: Medicare HMO | Admitting: Cardiovascular Disease

## 2020-04-26 DIAGNOSIS — N186 End stage renal disease: Secondary | ICD-10-CM | POA: Diagnosis not present

## 2020-04-26 DIAGNOSIS — Z992 Dependence on renal dialysis: Secondary | ICD-10-CM | POA: Diagnosis not present

## 2020-04-26 DIAGNOSIS — M3214 Glomerular disease in systemic lupus erythematosus: Secondary | ICD-10-CM | POA: Diagnosis not present

## 2020-04-27 DIAGNOSIS — N2581 Secondary hyperparathyroidism of renal origin: Secondary | ICD-10-CM | POA: Diagnosis not present

## 2020-04-27 DIAGNOSIS — Z992 Dependence on renal dialysis: Secondary | ICD-10-CM | POA: Diagnosis not present

## 2020-04-27 DIAGNOSIS — N186 End stage renal disease: Secondary | ICD-10-CM | POA: Diagnosis not present

## 2020-04-29 DIAGNOSIS — N2581 Secondary hyperparathyroidism of renal origin: Secondary | ICD-10-CM | POA: Diagnosis not present

## 2020-04-29 DIAGNOSIS — Z992 Dependence on renal dialysis: Secondary | ICD-10-CM | POA: Diagnosis not present

## 2020-04-29 DIAGNOSIS — N186 End stage renal disease: Secondary | ICD-10-CM | POA: Diagnosis not present

## 2020-04-30 DIAGNOSIS — I10 Essential (primary) hypertension: Secondary | ICD-10-CM | POA: Diagnosis not present

## 2020-04-30 DIAGNOSIS — Z992 Dependence on renal dialysis: Secondary | ICD-10-CM | POA: Diagnosis not present

## 2020-04-30 DIAGNOSIS — N186 End stage renal disease: Secondary | ICD-10-CM | POA: Diagnosis not present

## 2020-04-30 DIAGNOSIS — E1122 Type 2 diabetes mellitus with diabetic chronic kidney disease: Secondary | ICD-10-CM | POA: Diagnosis not present

## 2020-04-30 DIAGNOSIS — G629 Polyneuropathy, unspecified: Secondary | ICD-10-CM | POA: Diagnosis not present

## 2020-04-30 DIAGNOSIS — M1990 Unspecified osteoarthritis, unspecified site: Secondary | ICD-10-CM | POA: Diagnosis not present

## 2020-04-30 DIAGNOSIS — E782 Mixed hyperlipidemia: Secondary | ICD-10-CM | POA: Diagnosis not present

## 2020-04-30 DIAGNOSIS — Z9189 Other specified personal risk factors, not elsewhere classified: Secondary | ICD-10-CM | POA: Diagnosis not present

## 2020-04-30 DIAGNOSIS — E663 Overweight: Secondary | ICD-10-CM | POA: Diagnosis not present

## 2020-04-30 DIAGNOSIS — Z794 Long term (current) use of insulin: Secondary | ICD-10-CM | POA: Diagnosis not present

## 2020-05-01 DIAGNOSIS — N2581 Secondary hyperparathyroidism of renal origin: Secondary | ICD-10-CM | POA: Diagnosis not present

## 2020-05-01 DIAGNOSIS — Z992 Dependence on renal dialysis: Secondary | ICD-10-CM | POA: Diagnosis not present

## 2020-05-01 DIAGNOSIS — N186 End stage renal disease: Secondary | ICD-10-CM | POA: Diagnosis not present

## 2020-05-04 DIAGNOSIS — N186 End stage renal disease: Secondary | ICD-10-CM | POA: Diagnosis not present

## 2020-05-04 DIAGNOSIS — D631 Anemia in chronic kidney disease: Secondary | ICD-10-CM | POA: Diagnosis not present

## 2020-05-04 DIAGNOSIS — Z992 Dependence on renal dialysis: Secondary | ICD-10-CM | POA: Diagnosis not present

## 2020-05-04 DIAGNOSIS — E876 Hypokalemia: Secondary | ICD-10-CM | POA: Diagnosis not present

## 2020-05-04 DIAGNOSIS — N2581 Secondary hyperparathyroidism of renal origin: Secondary | ICD-10-CM | POA: Diagnosis not present

## 2020-05-06 DIAGNOSIS — N2581 Secondary hyperparathyroidism of renal origin: Secondary | ICD-10-CM | POA: Diagnosis not present

## 2020-05-06 DIAGNOSIS — E876 Hypokalemia: Secondary | ICD-10-CM | POA: Diagnosis not present

## 2020-05-06 DIAGNOSIS — Z992 Dependence on renal dialysis: Secondary | ICD-10-CM | POA: Diagnosis not present

## 2020-05-06 DIAGNOSIS — D631 Anemia in chronic kidney disease: Secondary | ICD-10-CM | POA: Diagnosis not present

## 2020-05-06 DIAGNOSIS — N186 End stage renal disease: Secondary | ICD-10-CM | POA: Diagnosis not present

## 2020-05-08 DIAGNOSIS — N2581 Secondary hyperparathyroidism of renal origin: Secondary | ICD-10-CM | POA: Diagnosis not present

## 2020-05-08 DIAGNOSIS — N186 End stage renal disease: Secondary | ICD-10-CM | POA: Diagnosis not present

## 2020-05-08 DIAGNOSIS — Z992 Dependence on renal dialysis: Secondary | ICD-10-CM | POA: Diagnosis not present

## 2020-05-08 DIAGNOSIS — D631 Anemia in chronic kidney disease: Secondary | ICD-10-CM | POA: Diagnosis not present

## 2020-05-08 DIAGNOSIS — E876 Hypokalemia: Secondary | ICD-10-CM | POA: Diagnosis not present

## 2020-05-10 ENCOUNTER — Other Ambulatory Visit: Payer: Self-pay | Admitting: Nephrology

## 2020-05-11 ENCOUNTER — Other Ambulatory Visit: Payer: Self-pay | Admitting: Nephrology

## 2020-05-11 DIAGNOSIS — R229 Localized swelling, mass and lump, unspecified: Secondary | ICD-10-CM

## 2020-05-11 DIAGNOSIS — G629 Polyneuropathy, unspecified: Secondary | ICD-10-CM | POA: Diagnosis not present

## 2020-05-11 DIAGNOSIS — N2581 Secondary hyperparathyroidism of renal origin: Secondary | ICD-10-CM | POA: Diagnosis not present

## 2020-05-11 DIAGNOSIS — Z794 Long term (current) use of insulin: Secondary | ICD-10-CM | POA: Diagnosis not present

## 2020-05-11 DIAGNOSIS — Z992 Dependence on renal dialysis: Secondary | ICD-10-CM | POA: Diagnosis not present

## 2020-05-11 DIAGNOSIS — M1990 Unspecified osteoarthritis, unspecified site: Secondary | ICD-10-CM | POA: Diagnosis not present

## 2020-05-11 DIAGNOSIS — I10 Essential (primary) hypertension: Secondary | ICD-10-CM | POA: Diagnosis not present

## 2020-05-11 DIAGNOSIS — M25562 Pain in left knee: Secondary | ICD-10-CM | POA: Diagnosis not present

## 2020-05-11 DIAGNOSIS — N186 End stage renal disease: Secondary | ICD-10-CM | POA: Diagnosis not present

## 2020-05-11 DIAGNOSIS — E1122 Type 2 diabetes mellitus with diabetic chronic kidney disease: Secondary | ICD-10-CM | POA: Diagnosis not present

## 2020-05-11 DIAGNOSIS — E663 Overweight: Secondary | ICD-10-CM | POA: Diagnosis not present

## 2020-05-11 DIAGNOSIS — E782 Mixed hyperlipidemia: Secondary | ICD-10-CM | POA: Diagnosis not present

## 2020-05-12 ENCOUNTER — Ambulatory Visit (HOSPITAL_COMMUNITY): Payer: Medicare HMO | Attending: Cardiology

## 2020-05-12 ENCOUNTER — Other Ambulatory Visit: Payer: Self-pay

## 2020-05-12 DIAGNOSIS — I25118 Atherosclerotic heart disease of native coronary artery with other forms of angina pectoris: Secondary | ICD-10-CM | POA: Diagnosis not present

## 2020-05-12 DIAGNOSIS — D649 Anemia, unspecified: Secondary | ICD-10-CM | POA: Diagnosis not present

## 2020-05-12 DIAGNOSIS — Z992 Dependence on renal dialysis: Secondary | ICD-10-CM

## 2020-05-12 DIAGNOSIS — E785 Hyperlipidemia, unspecified: Secondary | ICD-10-CM | POA: Insufficient documentation

## 2020-05-12 DIAGNOSIS — N186 End stage renal disease: Secondary | ICD-10-CM | POA: Diagnosis not present

## 2020-05-12 DIAGNOSIS — I10 Essential (primary) hypertension: Secondary | ICD-10-CM | POA: Insufficient documentation

## 2020-05-12 LAB — ECHOCARDIOGRAM COMPLETE
Area-P 1/2: 3.6 cm2
S' Lateral: 3 cm

## 2020-05-15 DIAGNOSIS — N186 End stage renal disease: Secondary | ICD-10-CM | POA: Diagnosis not present

## 2020-05-15 DIAGNOSIS — Z992 Dependence on renal dialysis: Secondary | ICD-10-CM | POA: Diagnosis not present

## 2020-05-15 DIAGNOSIS — N2581 Secondary hyperparathyroidism of renal origin: Secondary | ICD-10-CM | POA: Diagnosis not present

## 2020-05-18 ENCOUNTER — Other Ambulatory Visit: Payer: Self-pay | Admitting: Nephrology

## 2020-05-18 DIAGNOSIS — R229 Localized swelling, mass and lump, unspecified: Secondary | ICD-10-CM

## 2020-05-18 DIAGNOSIS — N186 End stage renal disease: Secondary | ICD-10-CM | POA: Diagnosis not present

## 2020-05-18 DIAGNOSIS — K651 Peritoneal abscess: Secondary | ICD-10-CM

## 2020-05-18 DIAGNOSIS — N2581 Secondary hyperparathyroidism of renal origin: Secondary | ICD-10-CM | POA: Diagnosis not present

## 2020-05-18 DIAGNOSIS — E876 Hypokalemia: Secondary | ICD-10-CM | POA: Diagnosis not present

## 2020-05-18 DIAGNOSIS — Z992 Dependence on renal dialysis: Secondary | ICD-10-CM | POA: Diagnosis not present

## 2020-05-20 DIAGNOSIS — E876 Hypokalemia: Secondary | ICD-10-CM | POA: Diagnosis not present

## 2020-05-20 DIAGNOSIS — N2581 Secondary hyperparathyroidism of renal origin: Secondary | ICD-10-CM | POA: Diagnosis not present

## 2020-05-20 DIAGNOSIS — Z992 Dependence on renal dialysis: Secondary | ICD-10-CM | POA: Diagnosis not present

## 2020-05-20 DIAGNOSIS — N186 End stage renal disease: Secondary | ICD-10-CM | POA: Diagnosis not present

## 2020-05-22 DIAGNOSIS — E876 Hypokalemia: Secondary | ICD-10-CM | POA: Diagnosis not present

## 2020-05-22 DIAGNOSIS — N2581 Secondary hyperparathyroidism of renal origin: Secondary | ICD-10-CM | POA: Diagnosis not present

## 2020-05-22 DIAGNOSIS — Z992 Dependence on renal dialysis: Secondary | ICD-10-CM | POA: Diagnosis not present

## 2020-05-22 DIAGNOSIS — N186 End stage renal disease: Secondary | ICD-10-CM | POA: Diagnosis not present

## 2020-05-24 ENCOUNTER — Other Ambulatory Visit: Payer: Medicare HMO

## 2020-05-25 DIAGNOSIS — Z992 Dependence on renal dialysis: Secondary | ICD-10-CM | POA: Diagnosis not present

## 2020-05-25 DIAGNOSIS — N186 End stage renal disease: Secondary | ICD-10-CM | POA: Diagnosis not present

## 2020-05-25 DIAGNOSIS — N2581 Secondary hyperparathyroidism of renal origin: Secondary | ICD-10-CM | POA: Diagnosis not present

## 2020-05-25 DIAGNOSIS — E876 Hypokalemia: Secondary | ICD-10-CM | POA: Diagnosis not present

## 2020-05-26 ENCOUNTER — Ambulatory Visit
Admission: RE | Admit: 2020-05-26 | Discharge: 2020-05-26 | Disposition: A | Discharge status: Home / Self Care | Payer: Medicare HMO | Source: Ambulatory Visit | Attending: Nephrology | Admitting: Nephrology

## 2020-05-26 DIAGNOSIS — K651 Peritoneal abscess: Secondary | ICD-10-CM

## 2020-05-26 DIAGNOSIS — R229 Localized swelling, mass and lump, unspecified: Secondary | ICD-10-CM

## 2020-05-26 DIAGNOSIS — R1904 Left lower quadrant abdominal swelling, mass and lump: Secondary | ICD-10-CM | POA: Diagnosis not present

## 2020-05-27 DIAGNOSIS — Z992 Dependence on renal dialysis: Secondary | ICD-10-CM | POA: Diagnosis not present

## 2020-05-27 DIAGNOSIS — E876 Hypokalemia: Secondary | ICD-10-CM | POA: Diagnosis not present

## 2020-05-27 DIAGNOSIS — N186 End stage renal disease: Secondary | ICD-10-CM | POA: Diagnosis not present

## 2020-05-27 DIAGNOSIS — N2581 Secondary hyperparathyroidism of renal origin: Secondary | ICD-10-CM | POA: Diagnosis not present

## 2020-05-27 DIAGNOSIS — M3214 Glomerular disease in systemic lupus erythematosus: Secondary | ICD-10-CM | POA: Diagnosis not present

## 2020-05-29 DIAGNOSIS — N2581 Secondary hyperparathyroidism of renal origin: Secondary | ICD-10-CM | POA: Diagnosis not present

## 2020-05-29 DIAGNOSIS — Z992 Dependence on renal dialysis: Secondary | ICD-10-CM | POA: Diagnosis not present

## 2020-05-29 DIAGNOSIS — N186 End stage renal disease: Secondary | ICD-10-CM | POA: Diagnosis not present

## 2020-06-01 DIAGNOSIS — Z992 Dependence on renal dialysis: Secondary | ICD-10-CM | POA: Diagnosis not present

## 2020-06-01 DIAGNOSIS — Z23 Encounter for immunization: Secondary | ICD-10-CM | POA: Diagnosis not present

## 2020-06-01 DIAGNOSIS — N2581 Secondary hyperparathyroidism of renal origin: Secondary | ICD-10-CM | POA: Diagnosis not present

## 2020-06-01 DIAGNOSIS — D631 Anemia in chronic kidney disease: Secondary | ICD-10-CM | POA: Diagnosis not present

## 2020-06-01 DIAGNOSIS — N186 End stage renal disease: Secondary | ICD-10-CM | POA: Diagnosis not present

## 2020-06-03 DIAGNOSIS — Z992 Dependence on renal dialysis: Secondary | ICD-10-CM | POA: Diagnosis not present

## 2020-06-03 DIAGNOSIS — N186 End stage renal disease: Secondary | ICD-10-CM | POA: Diagnosis not present

## 2020-06-03 DIAGNOSIS — D631 Anemia in chronic kidney disease: Secondary | ICD-10-CM | POA: Diagnosis not present

## 2020-06-03 DIAGNOSIS — N2581 Secondary hyperparathyroidism of renal origin: Secondary | ICD-10-CM | POA: Diagnosis not present

## 2020-06-03 DIAGNOSIS — Z23 Encounter for immunization: Secondary | ICD-10-CM | POA: Diagnosis not present

## 2020-06-05 ENCOUNTER — Encounter: Payer: Self-pay | Admitting: Cardiovascular Disease

## 2020-06-05 DIAGNOSIS — D631 Anemia in chronic kidney disease: Secondary | ICD-10-CM | POA: Diagnosis not present

## 2020-06-05 DIAGNOSIS — N2581 Secondary hyperparathyroidism of renal origin: Secondary | ICD-10-CM | POA: Diagnosis not present

## 2020-06-05 DIAGNOSIS — N186 End stage renal disease: Secondary | ICD-10-CM | POA: Diagnosis not present

## 2020-06-05 DIAGNOSIS — Z992 Dependence on renal dialysis: Secondary | ICD-10-CM | POA: Diagnosis not present

## 2020-06-05 DIAGNOSIS — Z23 Encounter for immunization: Secondary | ICD-10-CM | POA: Diagnosis not present

## 2020-06-05 NOTE — Progress Notes (Signed)
Cardiology Office Note:    Date:  06/07/2020   ID:  Paula Bass, DOB Feb 18, 1971, MRN [id number]  PCP:  Paula Mccreedy, MD  Cardiologist:  Paula Moores, MD  Electrophysiologist:  None   Referring MD: Paula Mccreedy, MD   Problem list 1.  Coronary artery disease-status post stenting follow-up in [place] in 2017 2.  End-stage renal disease 3.  Hypertension 4.  Hyperlipidemia  Chief Complaint  Patient presents with  . Coronary Artery Disease  . Chronic Kidney Disease      Feb. 17, 2020    Paula Bass Mode is a 50 y.o. female with a hx of end-stage renal disease, diabetes mellitus, hypertension, lupus who we are asked to see today by Paula Bass for further evaluation of chest discomfort.  She has had repeated episdoes of CP during dialysis .   Seems to be related to the volume and speed of the dialysis.   She has had them slow down dialysis and stop dialysis and the chest pains typically resolve at that point. Mid sterma,  Associated with dyspnea.  No radiation   Does not get any regular exercise  Watches her diet pretty well  Moved from [place] in   Has a hx of CAD with stenting  -she is had at least one stent placed.  She thinks he might of had balloon angioplasty of another vessel.  She thinks these CP are similar to when she was stented in the past.   June 07, 2020: Paula Bass is seen today for follow up of her CAD.  She has CKD and is on dialysis. She was having episodes of CP several months ago and saw Paula Bass for eval and renal transplant pre op. Scheduled for cath Cath Dec. 17, 2021 showed severe CAD with tight stenosis in LCx/OM and serial 99%-70% stenosis in mid RCA She is s/p stenting in LCX/OM and the RCA No further episodes of CP .   She needs to be cleared for surgery for renal transplantation.  She called to discuss the requirements for renal transplantation and apparently she will be able to continue taking aspirin 81 mg  a day.  They need her off Plavix. We discussed the fact that the safest way to do this would be to wait until she has been on aspirin Plavix for 1 year which would mean that she would stop Plavix in mid December, 2022.  She is not having any episodes of chest pain.   I discussed the case with her transplant coordinator  Paula Bass Phone [phone] Fax [phone]  According to Paula Bass she does not need to hold aspirin or Plavix prior to surgery.  They will just hold this medicines on the evening of surgery.  She is at low to moderate risk for her upcoming kidney transplant.  I have given the okay for her to hold the aspirin Plavix for 5 to 7 days following surgery to allow for adequate healing.    Past Medical History:  Diagnosis Date  . Acute diverticulitis 12/01/2017  . Anemia   . CAD in native artery    a. PCI to [place] in 2017 in [place]. b.  In 03/2018 she had a repeat cath with 80-90% mid/distal LCx s/p synergy DES, with residual RCA stenosis mg'd medically. c.01/2020 s/p orbital atherectomy/DES to the OM1 and OM2, residual RCA stenosis.  . Diabetes mellitus without complication ([place])   . Dialysis patient ([place])   . Diverticulitis   . ESRD (end stage  renal disease) ([place])   . GI bleeding   . Hypertension   . LGI bleed 12/16/2017  . Lower GI bleed   . Lupus ([place])   . Renal disorder    dialysis  . UTI (urinary tract infection) 12/01/2017    Past Surgical History:  Procedure Laterality Date  . BIOPSY  10/24/2019   Procedure: BIOPSY;  Surgeon: Otis Brace, MD;  [facility];  Service: Gastroenterology;;  EGD and COLON  . CARDIAC SURGERY     cardiac shunt  . COLONOSCOPY WITH PROPOFOL N/A 10/24/2019   Procedure: COLONOSCOPY WITH PROPOFOL;  Surgeon: Otis Brace, MD;  [facility];  Service: Gastroenterology;  Laterality: N/A;  . CORONARY ATHERECTOMY N/A 02/13/2020   Procedure: CORONARY ATHERECTOMY;  Surgeon: [name], MD;  Location: [place] [facility];  Service: Cardiovascular;  Laterality: N/A;  . CORONARY STENT INTERVENTION N/A 04/19/2018   Procedure: CORONARY STENT INTERVENTION;  Surgeon: [name], MD;  Location: [place] [facility];  Service: Cardiovascular;  Laterality: N/A;  . CORONARY STENT INTERVENTION N/A 02/13/2020   Procedure: CORONARY STENT INTERVENTION;  Surgeon: [name], MD;  Location: [place] [facility];  Service: Cardiovascular;  Laterality: N/A;  . ESOPHAGOGASTRODUODENOSCOPY (EGD) WITH PROPOFOL N/A 10/24/2019   Procedure: ESOPHAGOGASTRODUODENOSCOPY (EGD) WITH PROPOFOL;  Surgeon: Otis Brace, MD;  [facility];  Service: Gastroenterology;  Laterality: N/A;  . INCISION AND DRAINAGE PERIRECTAL ABSCESS N/A 07/09/2019   Procedure: IRRIGATION AND DEBRIDEMEN right buttock ABSCESS;  Surgeon: Kieth Brightly, Arta Bruce, MD;  Location: [place];  Service: General;  Laterality: N/A;  . INTRAVASCULAR PRESSURE WIRE/FFR STUDY N/A 02/13/2020   Procedure: INTRAVASCULAR PRESSURE WIRE/FFR STUDY;  Surgeon: [name], MD;  Location: [place] [facility];  Service: Cardiovascular;  Laterality: N/A;  . Kidney graft Left   . LEFT HEART CATH AND CORONARY ANGIOGRAPHY N/A 04/19/2018   Procedure: LEFT HEART CATH AND CORONARY ANGIOGRAPHY;  Surgeon: [name], MD;  Location: [place] [facility];  Service: Cardiovascular;  Laterality: N/A;  . LEFT HEART CATH AND CORONARY ANGIOGRAPHY N/A 02/13/2020   Procedure: LEFT HEART CATH AND CORONARY ANGIOGRAPHY;  Surgeon: [name], MD;  Location: [place] [facility];  Service: Cardiovascular;  Laterality: N/A;  . POLYPECTOMY  10/24/2019   Procedure: POLYPECTOMY;  Surgeon: Otis Brace, MD;  [facility];  Service: Gastroenterology;;    Current Medications: Current Meds  Medication Sig  . acetaminophen (TYLENOL) 325 MG tablet Take 2 tablets (650 mg total) by mouth every 4 (four) hours as needed for fever, headache or mild pain.  Marland Kitchen amLODipine  (NORVASC) 10 MG tablet Take 10 mg by mouth daily.  Marland Kitchen aspirin EC 81 MG tablet Take 1 tablet (81 mg total) by mouth daily.  Marland Kitchen atorvastatin (LIPITOR) 40 MG tablet Take 1 tablet (40 mg total) by mouth daily.  Lorin Picket 1 GM 210 MG(Fe) tablet Take 210-420 tablets by mouth See admin instructions. Take 420 mg by mouth three times a day with meals and 210 mg twice a day with a snack  . B Complex-C-Zn-Folic Acid (DIALYVITE/ZINC) TABS Take 1 tablet by mouth daily.  . clopidogrel (PLAVIX) 75 MG tablet Take 1 tablet (75 mg total) by mouth daily.  . insulin NPH-regular Human (70-30) 100 UNIT/ML injection Inject 30-40 Units into the skin See admin instructions. Inject 30 units in the morning and 30 units at bedtime.  . isosorbide mononitrate (IMDUR) 60 MG 24 hr tablet Take 1 tablet (60 mg total) by mouth daily.  Marland Kitchen lidocaine-prilocaine (  EMLA) cream Apply 1 application topically Every Tuesday,Thursday,and Saturday with dialysis.   Marland Kitchen metoprolol tartrate (LOPRESSOR) 25 MG tablet Take 1 tablet (25 mg total) by mouth 2 (two) times daily.  . multivitamin (RENA-VIT) TABS tablet Take 1 tablet by mouth daily.  . nitroGLYCERIN (NITROSTAT) 0.4 MG SL tablet Place 1 tablet (0.4 mg total) under the tongue every 5 (five) minutes as needed.  Vladimir Faster Glycol-Propyl Glycol 0.4-0.3 % SOLN Place 1 drop into both eyes 3 (three) times daily as needed (dry/irritated eyes.).  Marland Kitchen predniSONE (DELTASONE) 5 MG tablet Take 5 mg by mouth daily with breakfast.  . [DISCONTINUED] pantoprazole (PROTONIX) 40 MG tablet Take 1 tablet (40 mg total) by mouth daily. Needs office visit. (Patient taking differently: Take 40 mg by mouth daily.)     Allergies:   Penicillins   Social History   Socioeconomic History  . Marital status: Single    Spouse name: Not on file  . Number of children: 1  . Years of education: Not on file  . Highest education level: Not on file  Occupational History  . Occupation: disabled  Tobacco Use  . Smoking status:  Never Smoker  . Smokeless tobacco: Never Used  Vaping Use  . Vaping Use: Never used  Substance and Sexual Activity  . Alcohol use: Never  . Drug use: Never  . Sexual activity: Yes    Partners: Male    Birth control/protection: Condom  Other Topics Concern  . Not on file  Social History Narrative  . Not on file   Social Determinants of Health   Financial Resource Strain: Not on file  Food Insecurity: Not on file  Transportation Needs: Not on file  Physical Activity: Not on file  Stress: Not on file  Social Connections: Not on file     Family History: The patient's family history includes Healthy in her brother and brother; Hypertension in her mother; Other in her father. There is no history of Colon cancer, Esophageal cancer, or Breast cancer.  ROS:   Please see the history of present illness.     All other systems reviewed and are negative.  EKGs/Labs/Other Studies Reviewed:    The following studies were reviewed today:   EKG:       Recent Labs: 03/05/2020: ALT 10; BUN 82; Creatinine, Ser 12.49; Hemoglobin 11.3; Platelets 175; Potassium 4.7; Sodium 140; TSH 2.250  Recent Lipid Panel    Component Value Date/Time   CHOL 120 04/15/2018 0948   TRIG 92 04/15/2018 0948   HDL 59 04/15/2018 0948   CHOLHDL 2.0 04/15/2018 0948   LDLCALC 43 04/15/2018 0948    Physical Exam:    Physical Exam: Blood pressure 132/68, pulse 80, height 5\' 1"  (1.549 m), weight 140 lb (63.5 kg), last menstrual period 02/16/2018.  GEN:  Well nourished, well developed in no acute distress HEENT: Normal NECK: No JVD; No carotid bruits LYMPHATICS: No lymphadenopathy CARDIAC: RRR , no murmurs, rubs, gallops RESPIRATORY:  Clear to auscultation without rales, wheezing or rhonchi  ABDOMEN: Soft, non-tender, non-distended MUSCULOSKELETAL:  No edema; No deformity  SKIN: Warm and dry NEUROLOGIC:  Alert and oriented x 3   ASSESSMENT:    No diagnosis found. PLAN:      1.  Coronary artery  disease: She is status post stenting of her left circumflex artery and right coronary artery on February 13, 2020.  She is doing well.  She is not had any episodes of chest pain.  She has end-stage renal disease  and would like to get back on the renal transplant list.    I discussed the case with her transplant coordinator  Paula Bass Phone [phone] Fax [phone]  According to Paula Bass she does not need to hold aspirin or Plavix prior to surgery.  They will just hold this medicines on the evening of surgery.  She is at low to moderate risk for her upcoming kidney transplant.  I have given the okay for her to hold the aspirin Plavix for 5 to 7 days following surgery to allow for adequate healing.  [place] has done very well following her stenting.  She is not having any episodes of chest pain or shortness of breath.  I think that she is stable and can be listed on the renal transplant list at this point.    I will see her again in mid December, 2022 for follow-up visit.    Medication Adjustments/Labs and Tests Ordered: Current medicines are reviewed at length with the patient today.  Concerns regarding medicines are outlined above.  No orders of the defined types were placed in this encounter.  No orders of the defined types were placed in this encounter.   Patient Instructions  Medication Instructions:  Your physician recommends that you continue on your current medications as directed. Please refer to the Current Medication list given to you today.  *If you need a refill on your cardiac medications before your next appointment, please call your pharmacy*   Lab Work: none If you have labs (blood work) drawn today and your tests are completely normal, you will receive your results only by: Marland Kitchen MyChart Message (if you have MyChart) OR . A paper copy in the mail If you have any lab test that is abnormal or we need to change your treatment, we will call you to review the  results.   Testing/Procedures: none   Follow-Up: At [facility], you and your health needs are our priority.  As part of our continuing mission to provide you with exceptional heart care, we have created designated Provider Care Teams.  These Care Teams include your primary Cardiologist (physician) and Advanced Practice Providers (APPs -  Physician Assistants and Nurse Practitioners) who all work together to provide you with the care you need, when you need it.   Your next appointment:  DUE mid December 2022 7-8 month(s)  The format for your next appointment:   In Person  Provider:   You may see Paula Moores, MD or one of the following Advanced Practice Providers on your designated Care Team:    Richardson Dopp, PA-C  Robbie Lis, [place]       Signed, Paula Moores, MD  06/07/2020 5:57 PM    [place]

## 2020-06-07 ENCOUNTER — Telehealth: Payer: Self-pay | Admitting: Cardiovascular Disease

## 2020-06-07 ENCOUNTER — Other Ambulatory Visit: Payer: Self-pay

## 2020-06-07 ENCOUNTER — Ambulatory Visit (INDEPENDENT_AMBULATORY_CARE_PROVIDER_SITE_OTHER): Payer: Medicare HMO | Admitting: Cardiovascular Disease

## 2020-06-07 ENCOUNTER — Encounter: Payer: Self-pay | Admitting: Cardiovascular Disease

## 2020-06-07 ENCOUNTER — Other Ambulatory Visit: Payer: Self-pay | Admitting: Advanced Practice Midwife

## 2020-06-07 VITALS — BP 132/68 | HR 80 | Ht 61.0 in | Wt 140.0 lb

## 2020-06-07 DIAGNOSIS — I25119 Atherosclerotic heart disease of native coronary artery with unspecified angina pectoris: Secondary | ICD-10-CM | POA: Diagnosis not present

## 2020-06-07 DIAGNOSIS — N186 End stage renal disease: Secondary | ICD-10-CM

## 2020-06-07 DIAGNOSIS — Z1231 Encounter for screening mammogram for malignant neoplasm of breast: Secondary | ICD-10-CM

## 2020-06-07 NOTE — Telephone Encounter (Signed)
Paula Bass from [facility] kidney transplant list calling to speak with Dr. Acie Fredrickson. She states Dr. Acie Fredrickson had some questions about clearance for kidney transplant list.

## 2020-06-07 NOTE — Telephone Encounter (Signed)
Dr. Acie Fredrickson took phone call and spoke to [facility], Nurse.

## 2020-06-07 NOTE — Patient Instructions (Signed)
Medication Instructions:  Your physician recommends that you continue on your current medications as directed. Please refer to the Current Medication list given to you today.  *If you need a refill on your cardiac medications before your next appointment, please call your pharmacy*   Lab Work: none If you have labs (blood work) drawn today and your tests are completely normal, you will receive your results only by: Marland Kitchen MyChart Message (if you have MyChart) OR . A paper copy in the mail If you have any lab test that is abnormal or we need to change your treatment, we will call you to review the results.   Testing/Procedures: none   Follow-Up: At [facility], you and your health needs are our priority.  As part of our continuing mission to provide you with exceptional heart care, we have created designated Provider Care Teams.  These Care Teams include your primary Cardiologist (physician) and Advanced Practice Providers (APPs -  Physician Assistants and Nurse Practitioners) who all work together to provide you with the care you need, when you need it.   Your next appointment:  DUE mid December 2022 7-8 month(s)  The format for your next appointment:   In Person  Provider:   You may see Mertie Moores, MD or one of the following Advanced Practice Providers on your designated Care Team:    Richardson Dopp, PA-C  [place], [place]

## 2020-06-08 ENCOUNTER — Other Ambulatory Visit: Payer: Medicare HMO

## 2020-06-08 DIAGNOSIS — E876 Hypokalemia: Secondary | ICD-10-CM | POA: Diagnosis not present

## 2020-06-08 DIAGNOSIS — Z992 Dependence on renal dialysis: Secondary | ICD-10-CM | POA: Diagnosis not present

## 2020-06-08 DIAGNOSIS — N2581 Secondary hyperparathyroidism of renal origin: Secondary | ICD-10-CM | POA: Diagnosis not present

## 2020-06-08 DIAGNOSIS — E119 Type 2 diabetes mellitus without complications: Secondary | ICD-10-CM | POA: Diagnosis not present

## 2020-06-08 DIAGNOSIS — N186 End stage renal disease: Secondary | ICD-10-CM | POA: Diagnosis not present

## 2020-06-10 ENCOUNTER — Other Ambulatory Visit: Payer: Self-pay

## 2020-06-10 ENCOUNTER — Ambulatory Visit
Admission: RE | Admit: 2020-06-10 | Discharge: 2020-06-10 | Disposition: A | Discharge status: Home / Self Care | Payer: Medicare HMO | Source: Ambulatory Visit | Attending: Advanced Practice Midwife | Admitting: Advanced Practice Midwife

## 2020-06-10 ENCOUNTER — Ambulatory Visit
Admission: RE | Admit: 2020-06-10 | Discharge: 2020-06-10 | Disposition: A | Discharge status: Home / Self Care | Payer: Medicare HMO | Source: Ambulatory Visit | Attending: Internal Medicine | Admitting: Internal Medicine

## 2020-06-10 DIAGNOSIS — Z780 Asymptomatic menopausal state: Secondary | ICD-10-CM | POA: Diagnosis not present

## 2020-06-10 DIAGNOSIS — N2581 Secondary hyperparathyroidism of renal origin: Secondary | ICD-10-CM | POA: Diagnosis not present

## 2020-06-10 DIAGNOSIS — E119 Type 2 diabetes mellitus without complications: Secondary | ICD-10-CM | POA: Diagnosis not present

## 2020-06-10 DIAGNOSIS — Z1231 Encounter for screening mammogram for malignant neoplasm of breast: Secondary | ICD-10-CM

## 2020-06-10 DIAGNOSIS — Z7952 Long term (current) use of systemic steroids: Secondary | ICD-10-CM

## 2020-06-10 DIAGNOSIS — M810 Age-related osteoporosis without current pathological fracture: Secondary | ICD-10-CM | POA: Diagnosis not present

## 2020-06-10 DIAGNOSIS — E876 Hypokalemia: Secondary | ICD-10-CM | POA: Diagnosis not present

## 2020-06-10 DIAGNOSIS — Z992 Dependence on renal dialysis: Secondary | ICD-10-CM | POA: Diagnosis not present

## 2020-06-10 DIAGNOSIS — N186 End stage renal disease: Secondary | ICD-10-CM | POA: Diagnosis not present

## 2020-06-12 DIAGNOSIS — E119 Type 2 diabetes mellitus without complications: Secondary | ICD-10-CM | POA: Diagnosis not present

## 2020-06-12 DIAGNOSIS — E876 Hypokalemia: Secondary | ICD-10-CM | POA: Diagnosis not present

## 2020-06-12 DIAGNOSIS — Z992 Dependence on renal dialysis: Secondary | ICD-10-CM | POA: Diagnosis not present

## 2020-06-12 DIAGNOSIS — N186 End stage renal disease: Secondary | ICD-10-CM | POA: Diagnosis not present

## 2020-06-12 DIAGNOSIS — N2581 Secondary hyperparathyroidism of renal origin: Secondary | ICD-10-CM | POA: Diagnosis not present

## 2020-06-15 DIAGNOSIS — Z992 Dependence on renal dialysis: Secondary | ICD-10-CM | POA: Diagnosis not present

## 2020-06-15 DIAGNOSIS — N186 End stage renal disease: Secondary | ICD-10-CM | POA: Diagnosis not present

## 2020-06-15 DIAGNOSIS — N2581 Secondary hyperparathyroidism of renal origin: Secondary | ICD-10-CM | POA: Diagnosis not present

## 2020-06-15 DIAGNOSIS — E876 Hypokalemia: Secondary | ICD-10-CM | POA: Diagnosis not present

## 2020-06-17 DIAGNOSIS — E876 Hypokalemia: Secondary | ICD-10-CM | POA: Diagnosis not present

## 2020-06-17 DIAGNOSIS — N186 End stage renal disease: Secondary | ICD-10-CM | POA: Diagnosis not present

## 2020-06-17 DIAGNOSIS — Z992 Dependence on renal dialysis: Secondary | ICD-10-CM | POA: Diagnosis not present

## 2020-06-17 DIAGNOSIS — N2581 Secondary hyperparathyroidism of renal origin: Secondary | ICD-10-CM | POA: Diagnosis not present

## 2020-06-19 DIAGNOSIS — N186 End stage renal disease: Secondary | ICD-10-CM | POA: Diagnosis not present

## 2020-06-19 DIAGNOSIS — Z992 Dependence on renal dialysis: Secondary | ICD-10-CM | POA: Diagnosis not present

## 2020-06-19 DIAGNOSIS — E876 Hypokalemia: Secondary | ICD-10-CM | POA: Diagnosis not present

## 2020-06-19 DIAGNOSIS — N2581 Secondary hyperparathyroidism of renal origin: Secondary | ICD-10-CM | POA: Diagnosis not present

## 2020-06-22 DIAGNOSIS — N186 End stage renal disease: Secondary | ICD-10-CM | POA: Diagnosis not present

## 2020-06-22 DIAGNOSIS — Z992 Dependence on renal dialysis: Secondary | ICD-10-CM | POA: Diagnosis not present

## 2020-06-22 DIAGNOSIS — N2581 Secondary hyperparathyroidism of renal origin: Secondary | ICD-10-CM | POA: Diagnosis not present

## 2020-06-24 DIAGNOSIS — Z992 Dependence on renal dialysis: Secondary | ICD-10-CM | POA: Diagnosis not present

## 2020-06-24 DIAGNOSIS — N2581 Secondary hyperparathyroidism of renal origin: Secondary | ICD-10-CM | POA: Diagnosis not present

## 2020-06-24 DIAGNOSIS — N186 End stage renal disease: Secondary | ICD-10-CM | POA: Diagnosis not present

## 2020-06-26 DIAGNOSIS — Z992 Dependence on renal dialysis: Secondary | ICD-10-CM | POA: Diagnosis not present

## 2020-06-26 DIAGNOSIS — M3214 Glomerular disease in systemic lupus erythematosus: Secondary | ICD-10-CM | POA: Diagnosis not present

## 2020-06-26 DIAGNOSIS — N186 End stage renal disease: Secondary | ICD-10-CM | POA: Diagnosis not present

## 2020-06-26 DIAGNOSIS — N2581 Secondary hyperparathyroidism of renal origin: Secondary | ICD-10-CM | POA: Diagnosis not present

## 2020-06-27 DIAGNOSIS — N186 End stage renal disease: Secondary | ICD-10-CM | POA: Diagnosis not present

## 2020-06-29 DIAGNOSIS — D631 Anemia in chronic kidney disease: Secondary | ICD-10-CM | POA: Diagnosis not present

## 2020-06-29 DIAGNOSIS — Z992 Dependence on renal dialysis: Secondary | ICD-10-CM | POA: Diagnosis not present

## 2020-06-29 DIAGNOSIS — N2581 Secondary hyperparathyroidism of renal origin: Secondary | ICD-10-CM | POA: Diagnosis not present

## 2020-06-29 DIAGNOSIS — N186 End stage renal disease: Secondary | ICD-10-CM | POA: Diagnosis not present

## 2020-07-01 DIAGNOSIS — N2581 Secondary hyperparathyroidism of renal origin: Secondary | ICD-10-CM | POA: Diagnosis not present

## 2020-07-01 DIAGNOSIS — Z992 Dependence on renal dialysis: Secondary | ICD-10-CM | POA: Diagnosis not present

## 2020-07-01 DIAGNOSIS — N186 End stage renal disease: Secondary | ICD-10-CM | POA: Diagnosis not present

## 2020-07-01 DIAGNOSIS — D631 Anemia in chronic kidney disease: Secondary | ICD-10-CM | POA: Diagnosis not present

## 2020-07-03 DIAGNOSIS — Z992 Dependence on renal dialysis: Secondary | ICD-10-CM | POA: Diagnosis not present

## 2020-07-03 DIAGNOSIS — N186 End stage renal disease: Secondary | ICD-10-CM | POA: Diagnosis not present

## 2020-07-03 DIAGNOSIS — D631 Anemia in chronic kidney disease: Secondary | ICD-10-CM | POA: Diagnosis not present

## 2020-07-03 DIAGNOSIS — N2581 Secondary hyperparathyroidism of renal origin: Secondary | ICD-10-CM | POA: Diagnosis not present

## 2020-07-06 DIAGNOSIS — N2581 Secondary hyperparathyroidism of renal origin: Secondary | ICD-10-CM | POA: Diagnosis not present

## 2020-07-06 DIAGNOSIS — E876 Hypokalemia: Secondary | ICD-10-CM | POA: Diagnosis not present

## 2020-07-06 DIAGNOSIS — N186 End stage renal disease: Secondary | ICD-10-CM | POA: Diagnosis not present

## 2020-07-06 DIAGNOSIS — Z992 Dependence on renal dialysis: Secondary | ICD-10-CM | POA: Diagnosis not present

## 2020-07-07 DIAGNOSIS — E876 Hypokalemia: Secondary | ICD-10-CM | POA: Diagnosis not present

## 2020-07-07 DIAGNOSIS — Z992 Dependence on renal dialysis: Secondary | ICD-10-CM | POA: Diagnosis not present

## 2020-07-07 DIAGNOSIS — N186 End stage renal disease: Secondary | ICD-10-CM | POA: Diagnosis not present

## 2020-07-07 DIAGNOSIS — N2581 Secondary hyperparathyroidism of renal origin: Secondary | ICD-10-CM | POA: Diagnosis not present

## 2020-07-08 ENCOUNTER — Ambulatory Visit: Payer: Medicare HMO | Admitting: Student

## 2020-07-09 DIAGNOSIS — Z992 Dependence on renal dialysis: Secondary | ICD-10-CM | POA: Diagnosis not present

## 2020-07-09 DIAGNOSIS — N186 End stage renal disease: Secondary | ICD-10-CM | POA: Diagnosis not present

## 2020-07-13 DIAGNOSIS — Z992 Dependence on renal dialysis: Secondary | ICD-10-CM | POA: Diagnosis not present

## 2020-07-13 DIAGNOSIS — N2581 Secondary hyperparathyroidism of renal origin: Secondary | ICD-10-CM | POA: Diagnosis not present

## 2020-07-13 DIAGNOSIS — E876 Hypokalemia: Secondary | ICD-10-CM | POA: Diagnosis not present

## 2020-07-13 DIAGNOSIS — N186 End stage renal disease: Secondary | ICD-10-CM | POA: Diagnosis not present

## 2020-07-15 DIAGNOSIS — N186 End stage renal disease: Secondary | ICD-10-CM | POA: Diagnosis not present

## 2020-07-15 DIAGNOSIS — E876 Hypokalemia: Secondary | ICD-10-CM | POA: Diagnosis not present

## 2020-07-15 DIAGNOSIS — Z992 Dependence on renal dialysis: Secondary | ICD-10-CM | POA: Diagnosis not present

## 2020-07-15 DIAGNOSIS — N2581 Secondary hyperparathyroidism of renal origin: Secondary | ICD-10-CM | POA: Diagnosis not present

## 2020-07-16 DIAGNOSIS — J22 Unspecified acute lower respiratory infection: Secondary | ICD-10-CM | POA: Diagnosis not present

## 2020-07-16 DIAGNOSIS — Z794 Long term (current) use of insulin: Secondary | ICD-10-CM | POA: Diagnosis not present

## 2020-07-16 DIAGNOSIS — E1122 Type 2 diabetes mellitus with diabetic chronic kidney disease: Secondary | ICD-10-CM | POA: Diagnosis not present

## 2020-07-16 DIAGNOSIS — M1990 Unspecified osteoarthritis, unspecified site: Secondary | ICD-10-CM | POA: Diagnosis not present

## 2020-07-16 DIAGNOSIS — E663 Overweight: Secondary | ICD-10-CM | POA: Diagnosis not present

## 2020-07-16 DIAGNOSIS — B9729 Other coronavirus as the cause of diseases classified elsewhere: Secondary | ICD-10-CM | POA: Diagnosis not present

## 2020-07-16 DIAGNOSIS — G629 Polyneuropathy, unspecified: Secondary | ICD-10-CM | POA: Diagnosis not present

## 2020-07-16 DIAGNOSIS — I10 Essential (primary) hypertension: Secondary | ICD-10-CM | POA: Diagnosis not present

## 2020-07-16 DIAGNOSIS — N186 End stage renal disease: Secondary | ICD-10-CM | POA: Diagnosis not present

## 2020-07-16 DIAGNOSIS — Z992 Dependence on renal dialysis: Secondary | ICD-10-CM | POA: Diagnosis not present

## 2020-07-17 DIAGNOSIS — Z992 Dependence on renal dialysis: Secondary | ICD-10-CM | POA: Diagnosis not present

## 2020-07-17 DIAGNOSIS — N186 End stage renal disease: Secondary | ICD-10-CM | POA: Diagnosis not present

## 2020-07-17 DIAGNOSIS — N2581 Secondary hyperparathyroidism of renal origin: Secondary | ICD-10-CM | POA: Diagnosis not present

## 2020-07-17 DIAGNOSIS — E876 Hypokalemia: Secondary | ICD-10-CM | POA: Diagnosis not present

## 2020-07-20 DIAGNOSIS — Z992 Dependence on renal dialysis: Secondary | ICD-10-CM | POA: Diagnosis not present

## 2020-07-20 DIAGNOSIS — N2581 Secondary hyperparathyroidism of renal origin: Secondary | ICD-10-CM | POA: Diagnosis not present

## 2020-07-20 DIAGNOSIS — E876 Hypokalemia: Secondary | ICD-10-CM | POA: Diagnosis not present

## 2020-07-20 DIAGNOSIS — N186 End stage renal disease: Secondary | ICD-10-CM | POA: Diagnosis not present

## 2020-07-21 ENCOUNTER — Other Ambulatory Visit: Payer: Self-pay

## 2020-07-21 ENCOUNTER — Ambulatory Visit (INDEPENDENT_AMBULATORY_CARE_PROVIDER_SITE_OTHER): Payer: Medicare HMO | Admitting: Obstetrics

## 2020-07-21 ENCOUNTER — Encounter: Payer: Self-pay | Admitting: Obstetrics

## 2020-07-21 ENCOUNTER — Other Ambulatory Visit (HOSPITAL_COMMUNITY)
Admission: RE | Admit: 2020-07-21 | Discharge: 2020-07-21 | Disposition: A | Discharge status: Home / Self Care | Payer: Medicare HMO | Source: Ambulatory Visit | Attending: Obstetrics | Admitting: Obstetrics

## 2020-07-21 VITALS — BP 129/82 | HR 80 | Wt 138.2 lb

## 2020-07-21 DIAGNOSIS — N898 Other specified noninflammatory disorders of vagina: Secondary | ICD-10-CM

## 2020-07-21 DIAGNOSIS — Z1151 Encounter for screening for human papillomavirus (HPV): Secondary | ICD-10-CM | POA: Diagnosis not present

## 2020-07-21 DIAGNOSIS — N760 Acute vaginitis: Secondary | ICD-10-CM | POA: Insufficient documentation

## 2020-07-21 DIAGNOSIS — Z113 Encounter for screening for infections with a predominantly sexual mode of transmission: Secondary | ICD-10-CM | POA: Diagnosis not present

## 2020-07-21 DIAGNOSIS — B9689 Other specified bacterial agents as the cause of diseases classified elsewhere: Secondary | ICD-10-CM | POA: Diagnosis not present

## 2020-07-21 DIAGNOSIS — Z01419 Encounter for gynecological examination (general) (routine) without abnormal findings: Secondary | ICD-10-CM

## 2020-07-21 DIAGNOSIS — R69 Illness, unspecified: Secondary | ICD-10-CM | POA: Diagnosis not present

## 2020-07-21 LAB — POCT URINALYSIS DIPSTICK
Bilirubin, UA: NEGATIVE
Blood, UA: NEGATIVE
Glucose, UA: NEGATIVE
Ketones, UA: NEGATIVE
Leukocytes, UA: NEGATIVE
Nitrite, UA: NEGATIVE
Odor: NEGATIVE
Protein, UA: NEGATIVE
Spec Grav, UA: 1.015 (ref 1.010–1.025)
Urobilinogen, UA: 0.2 E.U./dL
pH, UA: 7 (ref 5.0–8.0)

## 2020-07-21 NOTE — Progress Notes (Signed)
Subjective:        Paula Bass is a 50 y.o. female here for a routine exam.  Current complaints: Vaginal discharge.    Personal health questionnaire:  Is patient Ashkenazi Jewish, have a family history of breast and/or ovarian cancer: no Is there a family history of uterine cancer diagnosed at age < 75, gastrointestinal cancer, urinary tract cancer, family member who is a Field seismologist syndrome-associated carrier: no Is the patient overweight and hypertensive, family history of diabetes, personal history of gestational diabetes, preeclampsia or PCOS: no Is patient over 63, have PCOS,  family history of premature CHD under age 9, diabetes, smoke, have hypertension or peripheral artery disease:  no At any time, has a partner hit, kicked or otherwise hurt or frightened you?: no Over the past 2 weeks, have you felt down, depressed or hopeless?: no Over the past 2 weeks, have you felt little interest or pleasure in doing things?:no   Gynecologic History Patient's last menstrual period was 02/16/2018 (approximate). Contraception: post menopausal status Last Pap: 1=11=2021. Results were: NILM with positive HRHPV Last mammogram: 06-11-2020. Results were: normal  Obstetric History OB History  Gravida Para Term Preterm AB Living  2       1 1   SAB IAB Ectopic Multiple Live Births    1          # Outcome Date GA Lbr Len/2nd Weight Sex Delivery Anes PTL Lv  2 Gravida           1 IAB             Past Medical History:  Diagnosis Date  . Acute diverticulitis 12/01/2017  . Anemia   . CAD in native artery    a. PCI to [place] in 2017 in [place]. b.  In 03/2018 she had a repeat cath with 80-90% mid/distal LCx s/p synergy DES, with residual RCA stenosis mg'd medically. c.01/2020 s/p orbital atherectomy/DES to the OM1 and OM2, residual RCA stenosis.  . Diabetes mellitus without complication ([place])   . Dialysis patient ([place])   . Diverticulitis   . ESRD (end stage renal disease) ([place])   . GI  bleeding   . Hypertension   . LGI bleed 12/16/2017  . Lower GI bleed   . Lupus ([place])   . Renal disorder    dialysis  . UTI (urinary tract infection) 12/01/2017    Past Surgical History:  Procedure Laterality Date  . BIOPSY  10/24/2019   Procedure: BIOPSY;  Surgeon: Otis Brace, MD;  Location: [facility];  Service: Gastroenterology;;  EGD and COLON  . CARDIAC SURGERY     cardiac shunt  . COLONOSCOPY WITH PROPOFOL N/A 10/24/2019   Procedure: COLONOSCOPY WITH PROPOFOL;  Surgeon: Otis Brace, MD;  Location: [facility];  Service: Gastroenterology;  Laterality: N/A;  . CORONARY ATHERECTOMY N/A 02/13/2020   Procedure: CORONARY ATHERECTOMY;  Surgeon: [name], MD;  Location: [place] [facility];  Service: Cardiovascular;  Laterality: N/A;  . CORONARY STENT INTERVENTION N/A 04/19/2018   Procedure: CORONARY STENT INTERVENTION;  Surgeon: [name], MD;  Location: [place] [facility];  Service: Cardiovascular;  Laterality: N/A;  . CORONARY STENT INTERVENTION N/A 02/13/2020   Procedure: CORONARY STENT INTERVENTION;  Surgeon: [name], MD;  Location: [place] [facility];  Service: Cardiovascular;  Laterality: N/A;  . ESOPHAGOGASTRODUODENOSCOPY (EGD) WITH PROPOFOL N/A 10/24/2019   Procedure: ESOPHAGOGASTRODUODENOSCOPY (EGD) WITH PROPOFOL;  Surgeon: Otis Brace, MD;  [facility]: [facility];  Service: Gastroenterology;  Laterality: N/A;  .  INCISION AND DRAINAGE PERIRECTAL ABSCESS N/A 07/09/2019   Procedure: IRRIGATION AND DEBRIDEMEN right buttock ABSCESS;  Surgeon: Kieth Brightly, Arta Bruce, MD;  Location: [place];  Service: General;  Laterality: N/A;  . INTRAVASCULAR PRESSURE WIRE/FFR STUDY N/A 02/13/2020   Procedure: INTRAVASCULAR PRESSURE WIRE/FFR STUDY;  Surgeon: [name], MD;  Location: [facility] [facility];  Service: Cardiovascular;  Laterality: N/A;  . Kidney graft Left   . LEFT HEART CATH AND CORONARY ANGIOGRAPHY N/A 04/19/2018   Procedure: LEFT HEART CATH AND  CORONARY ANGIOGRAPHY;  Surgeon: [name], MD;  Location: [facility];  Service: Cardiovascular;  Laterality: N/A;  . LEFT HEART CATH AND CORONARY ANGIOGRAPHY N/A 02/13/2020   Procedure: LEFT HEART CATH AND CORONARY ANGIOGRAPHY;  Surgeon: [name], MD;  Location: [facility];  Service: Cardiovascular;  Laterality: N/A;  . POLYPECTOMY  10/24/2019   Procedure: POLYPECTOMY;  Surgeon: Otis Brace, MD;  [facility];  Service: Gastroenterology;;     Current Outpatient Medications:  .  amLODipine (NORVASC) 10 MG tablet, Take 10 mg by mouth daily., Disp: , Rfl:  .  aspirin EC 81 MG tablet, Take 1 tablet (81 mg total) by mouth daily., Disp: , Rfl:  .  atorvastatin (LIPITOR) 40 MG tablet, Take 1 tablet (40 mg total) by mouth daily., Disp: 90 tablet, Rfl: 3 .  AURYXIA 1 GM 210 MG(Fe) tablet, Take 210-420 tablets by mouth See admin instructions. Take 420 mg by mouth three times a day with meals and 210 mg twice a day with a snack, Disp: , Rfl:  .  clopidogrel (PLAVIX) 75 MG tablet, Take 1 tablet (75 mg total) by mouth daily., Disp: , Rfl:  .  insulin NPH-regular Human (70-30) 100 UNIT/ML injection, Inject 30-40 Units into the skin See admin instructions. Inject 30 units in the morning and 30 units at bedtime. (Patient taking differently: Inject 40 Units into the skin daily with breakfast. Inject 40 units in the morning and 5 units at bedtime.), Disp: 10 mL, Rfl: 1 .  isosorbide mononitrate (IMDUR) 60 MG 24 hr tablet, Take 1 tablet (60 mg total) by mouth daily., Disp: 90 tablet, Rfl: 3 .  lidocaine-prilocaine (EMLA) cream, Apply 1 application topically Every Tuesday,Thursday,and Saturday with dialysis. , Disp: , Rfl:  .  metoprolol tartrate (LOPRESSOR) 25 MG tablet, Take 1 tablet (25 mg total) by mouth 2 (two) times daily., Disp: 180 tablet, Rfl: 2 .  multivitamin (RENA-VIT) TABS tablet, Take 1 tablet by mouth daily., Disp: , Rfl:  .  predniSONE (DELTASONE) 5 MG  tablet, Take 5 mg by mouth daily with breakfast., Disp: , Rfl:  .  acetaminophen (TYLENOL) 325 MG tablet, Take 2 tablets (650 mg total) by mouth every 4 (four) hours as needed for fever, headache or mild pain., Disp:  , Rfl:  .  B Complex-C-Zn-Folic Acid (DIALYVITE/ZINC) TABS, Take 1 tablet by mouth daily. (Patient not taking: Reported on 07/21/2020), Disp: , Rfl:  .  nitroGLYCERIN (NITROSTAT) 0.4 MG SL tablet, Place 1 tablet (0.4 mg total) under the tongue every 5 (five) minutes as needed. (Patient not taking: Reported on 07/21/2020), Disp: 25 tablet, Rfl: 2 .  Polyethyl Glycol-Propyl Glycol 0.4-0.3 % SOLN, Place 1 drop into both eyes 3 (three) times daily as needed (dry/irritated eyes.). (Patient not taking: Reported on 07/21/2020), Disp: , Rfl:  Allergies  Allergen Reactions  . Penicillins Hives    DID THE REACTION INVOLVE: Swelling of the face/tongue/throat, SOB, or low BP? Yes Sudden or severe rash/hives,  skin peeling, or the inside of the mouth or nose? Yes Did it require medical treatment? No When did it last happen?Within the past 10 years If all above answers are "NO", may proceed with cephalosporin use. Ceftriaxone/merrem ok    Social History   Tobacco Use  . Smoking status: Never Smoker  . Smokeless tobacco: Never Used  Substance Use Topics  . Alcohol use: Never    Family History  Problem Relation Age of Onset  . Hypertension Mother   . Other Father        unkown health history  . Healthy Brother   . Healthy Brother   . Colon cancer Neg Hx   . Esophageal cancer Neg Hx   . Breast cancer Neg Hx       Review of Systems  Constitutional: negative for fatigue and weight loss Respiratory: negative for cough and wheezing Cardiovascular: negative for chest pain, fatigue and palpitations Gastrointestinal: negative for abdominal pain and change in bowel habits Musculoskeletal:negative for myalgias Neurological: negative for gait problems and tremors Behavioral/Psych:  negative for abusive relationship, depression Endocrine: negative for temperature intolerance    Genitourinary: positive for vaginal discharge.  negative for abnormal menstrual periods, genital lesions, hot flashes, sexual problems  Integument/breast: negative for breast lump, breast tenderness, nipple discharge and skin lesion(s)    Objective:       BP 129/82   Pulse 80   Wt 138 lb 3.2 oz (62.7 kg)   LMP 02/16/2018 (Approximate)   BMI 26.11 kg/m  General:   alert and no distress  Skin:   no rash or abnormalities  Lungs:   clear to auscultation bilaterally  Heart:   regular rate and rhythm, S1, S2 normal, no murmur, click, rub or gallop  Breasts:   normal without suspicious masses, skin or nipple changes or axillary nodes  Abdomen:  normal findings: no organomegaly, soft, non-tender and no hernia  Pelvis:  External genitalia: normal general appearance Urinary system: urethral meatus normal and bladder without fullness, nontender Vaginal: normal without tenderness, induration or masses Cervix: normal appearance Adnexa: normal bimanual exam Uterus: anteverted and non-tender, normal size   Lab Review Urine pregnancy test Labs reviewed yes Radiologic studies reviewed yes  I have spent a total of 20 minutes of face-to-face time, excluding clinical staff time, reviewing notes and preparing to see patient, ordering tests and/or medications, and counseling the patient.  Assessment:     1. Encounter for gynecological examination with Papanicolaou smear of cervix Rx: - Cytology - PAP( [facility]) - POCT Urinalysis Dipstick  2. Vaginal discharge Rx: - Cervicovaginal ancillary only     Plan:    Education reviewed: calcium supplements, depression evaluation, low fat, low cholesterol diet, safe sex/STD prevention, self breast exams and weight bearing exercise. Follow up in: 1 year.   No orders of the defined types were placed in this encounter.  Orders Placed This Encounter   Procedures  . POCT Urinalysis Dipstick     Shelly Bombard, MD 07/21/2020 2:45 PM

## 2020-07-21 NOTE — Progress Notes (Signed)
Pt presents for annual and pap  Pt reports "brownish" vaginal discharge  Neg pap with positive HPV on 03/10/2019 Normal mammogram on 06/11/20 Normal Bone density 06/11/20 Going to [place] for kidney transplant

## 2020-07-22 DIAGNOSIS — N186 End stage renal disease: Secondary | ICD-10-CM | POA: Diagnosis not present

## 2020-07-22 DIAGNOSIS — N2581 Secondary hyperparathyroidism of renal origin: Secondary | ICD-10-CM | POA: Diagnosis not present

## 2020-07-22 DIAGNOSIS — Z992 Dependence on renal dialysis: Secondary | ICD-10-CM | POA: Diagnosis not present

## 2020-07-22 DIAGNOSIS — E876 Hypokalemia: Secondary | ICD-10-CM | POA: Diagnosis not present

## 2020-07-24 DIAGNOSIS — Z992 Dependence on renal dialysis: Secondary | ICD-10-CM | POA: Diagnosis not present

## 2020-07-24 DIAGNOSIS — N2581 Secondary hyperparathyroidism of renal origin: Secondary | ICD-10-CM | POA: Diagnosis not present

## 2020-07-24 DIAGNOSIS — N186 End stage renal disease: Secondary | ICD-10-CM | POA: Diagnosis not present

## 2020-07-24 DIAGNOSIS — E876 Hypokalemia: Secondary | ICD-10-CM | POA: Diagnosis not present

## 2020-07-27 DIAGNOSIS — N186 End stage renal disease: Secondary | ICD-10-CM | POA: Diagnosis not present

## 2020-07-27 DIAGNOSIS — D631 Anemia in chronic kidney disease: Secondary | ICD-10-CM | POA: Diagnosis not present

## 2020-07-27 DIAGNOSIS — M3214 Glomerular disease in systemic lupus erythematosus: Secondary | ICD-10-CM | POA: Diagnosis not present

## 2020-07-27 DIAGNOSIS — Z992 Dependence on renal dialysis: Secondary | ICD-10-CM | POA: Diagnosis not present

## 2020-07-27 DIAGNOSIS — N2581 Secondary hyperparathyroidism of renal origin: Secondary | ICD-10-CM | POA: Diagnosis not present

## 2020-07-27 LAB — CYTOLOGY - PAP
Chlamydia: NEGATIVE
Comment: NEGATIVE
Comment: NEGATIVE
Comment: NEGATIVE
Comment: NORMAL
Diagnosis: NEGATIVE
High risk HPV: NEGATIVE
Neisseria Gonorrhea: NEGATIVE
Trichomonas: NEGATIVE

## 2020-07-27 LAB — CERVICOVAGINAL ANCILLARY ONLY
Bacterial Vaginitis (gardnerella): POSITIVE — AB
Candida Glabrata: NEGATIVE
Candida Vaginitis: NEGATIVE
Chlamydia: NEGATIVE
Comment: NEGATIVE
Comment: NEGATIVE
Comment: NEGATIVE
Comment: NEGATIVE
Comment: NEGATIVE
Comment: NORMAL
Neisseria Gonorrhea: NEGATIVE
Trichomonas: NEGATIVE

## 2020-07-28 ENCOUNTER — Other Ambulatory Visit: Payer: Self-pay | Admitting: Obstetrics

## 2020-07-28 DIAGNOSIS — Z992 Dependence on renal dialysis: Secondary | ICD-10-CM | POA: Diagnosis not present

## 2020-07-28 DIAGNOSIS — B9689 Other specified bacterial agents as the cause of diseases classified elsewhere: Secondary | ICD-10-CM

## 2020-07-28 DIAGNOSIS — M3214 Glomerular disease in systemic lupus erythematosus: Secondary | ICD-10-CM | POA: Diagnosis not present

## 2020-07-28 DIAGNOSIS — N760 Acute vaginitis: Secondary | ICD-10-CM

## 2020-07-28 DIAGNOSIS — N186 End stage renal disease: Secondary | ICD-10-CM | POA: Diagnosis not present

## 2020-07-28 MED ORDER — METRONIDAZOLE 500 MG PO TABS: 500.0000 mg | ORAL_TABLET | Freq: Two times a day (BID) | ORAL | 2 refills | Status: DC

## 2020-07-29 DIAGNOSIS — N2581 Secondary hyperparathyroidism of renal origin: Secondary | ICD-10-CM | POA: Diagnosis not present

## 2020-07-29 DIAGNOSIS — N186 End stage renal disease: Secondary | ICD-10-CM | POA: Diagnosis not present

## 2020-07-29 DIAGNOSIS — Z992 Dependence on renal dialysis: Secondary | ICD-10-CM | POA: Diagnosis not present

## 2020-07-30 IMAGING — MG DIGITAL DIAGNOSTIC BILAT W/ TOMO W/ CAD
6 of 12 series · 6 of 36 positions shown · non-contrast
Comparison: Previous exam(s).

CLINICAL DATA: Recall from screening mammography with
tomosynthesis, possible mass involving the OUTER RIGHT breast at
MIDDLE depth (visible on CC and MLO images) and possible mass
involving the UPPER LEFT breast at far POSTERIOR depth (visible only
on the MLO images). The patient states that she is being evaluated
for a possible renal transplant.

EXAM:
DIGITAL DIAGNOSTIC BILATERAL MAMMOGRAM WITH CAD AND TOMO
LIMITED ULTRASOUND BILATERAL BREASTS

[R MLO synth-2D]
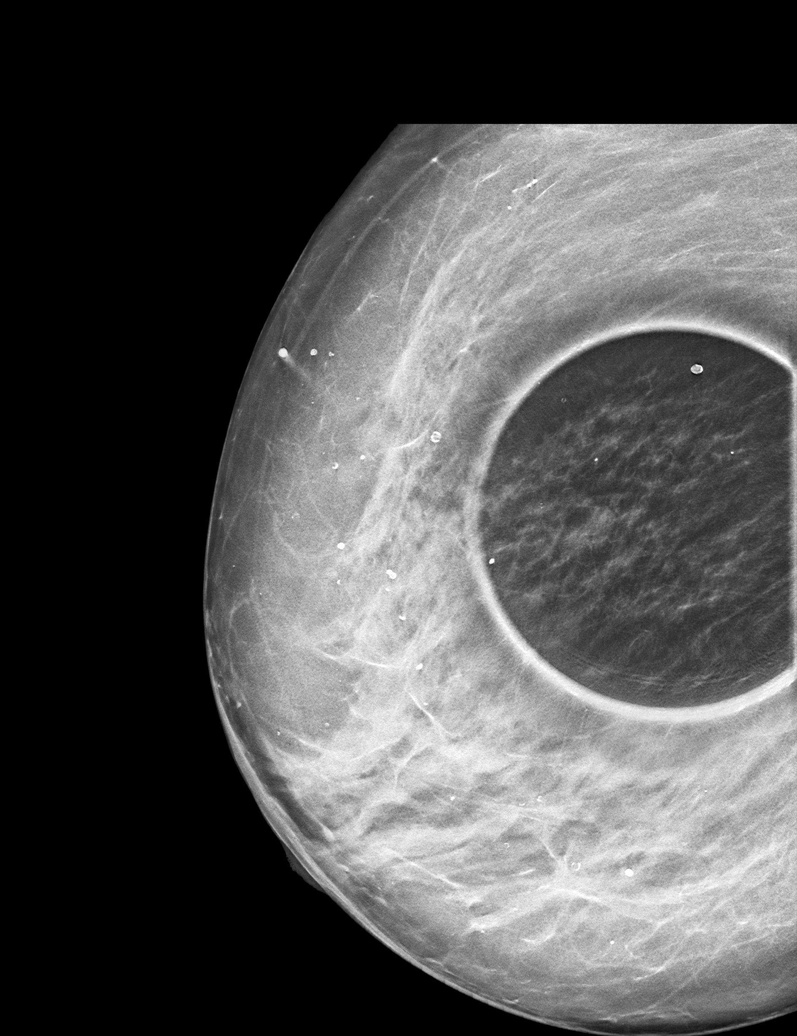

[L ML synth-2D (1 of 2)]
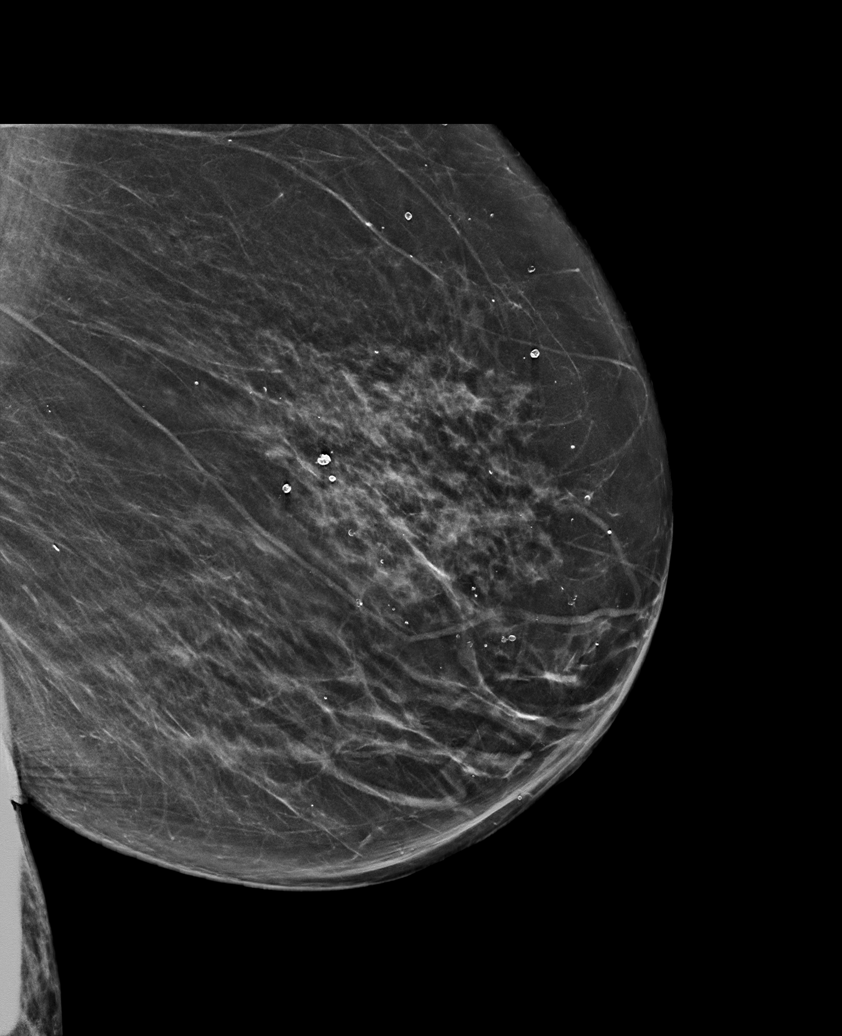

[R CC synth-2D]
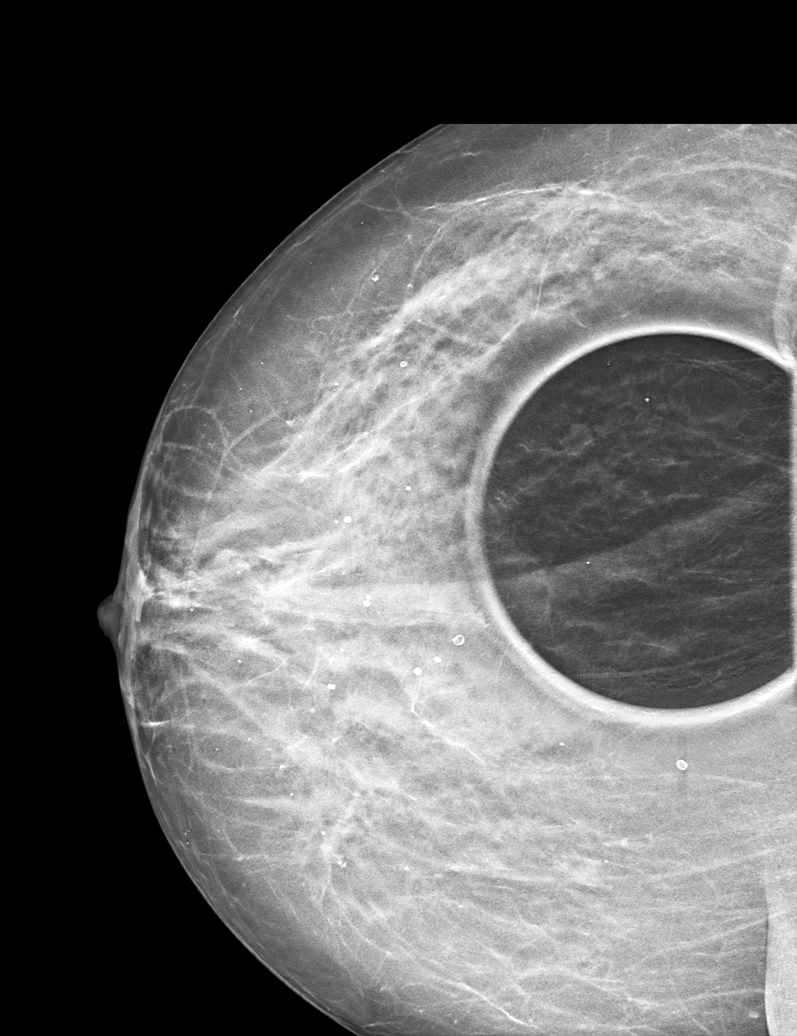

[L MLO synth-2D]
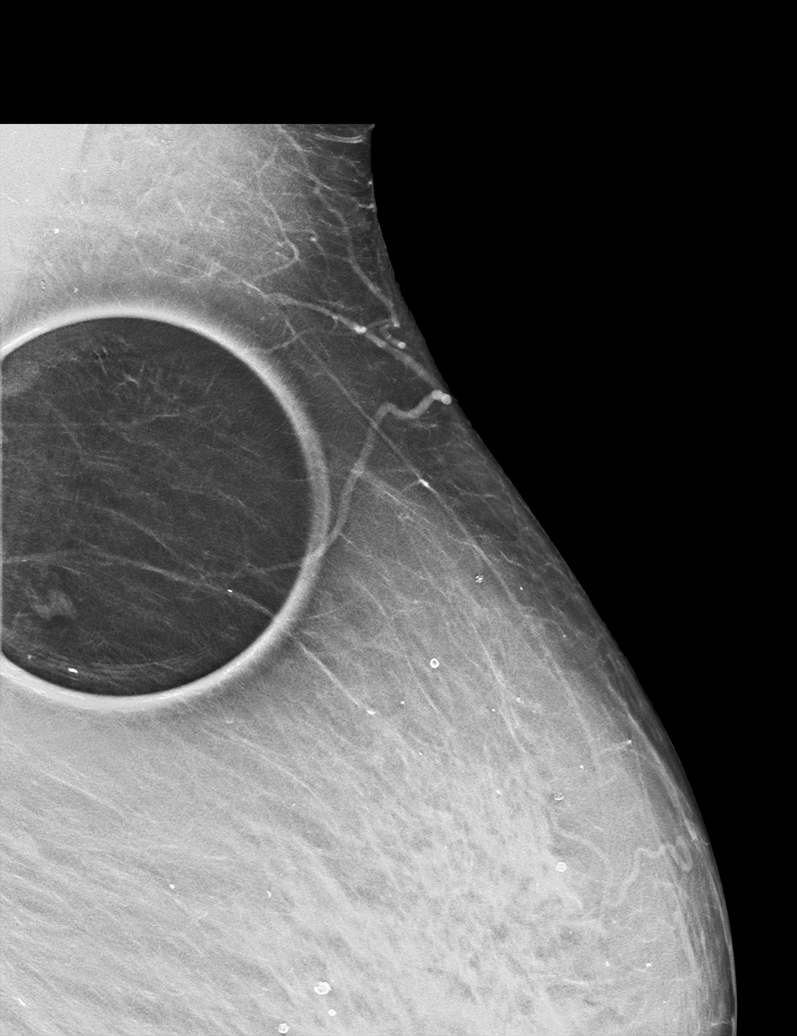

[L ML synth-2D (2 of 2)]
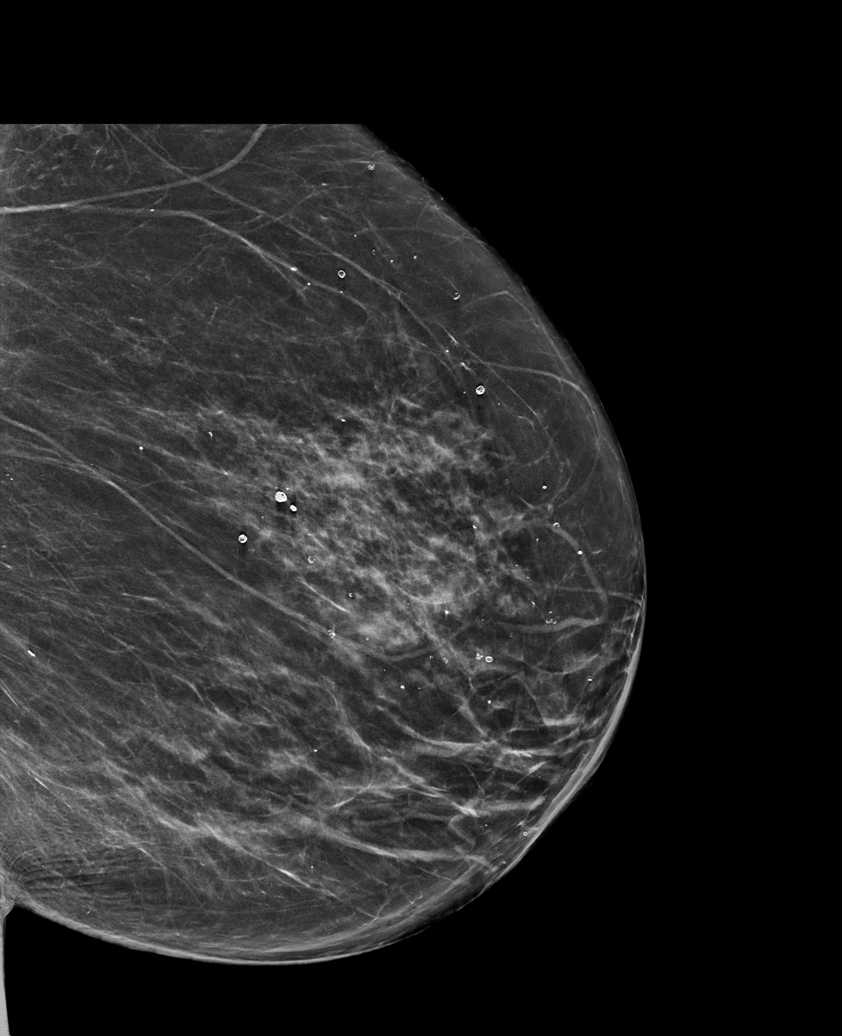

[L XCCL synth-2D]
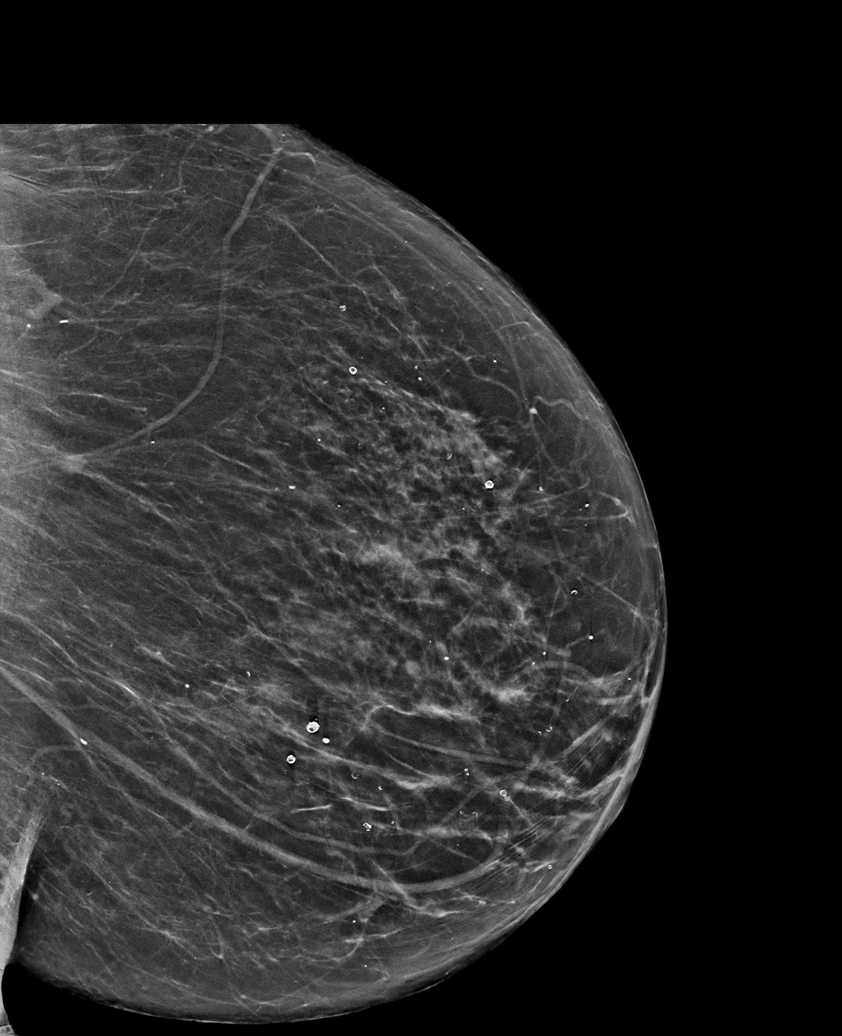

[6 of 36 positions shown; findings below may reference images not displayed]

ACR Breast Density Category c: The breast tissue is heterogeneously
dense, which may obscure small masses.
FINDINGS: RIGHT: Tomosynthesis and synthesized spot-compression CC and MLO
views of the area of concern in the RIGHT breast were obtained.

These confirm a partially compressible circumscribed isodense mass
measuring approximately 6-7 mm without associated architectural
distortion or suspicious calcifications.

Targeted RIGHT breast ultrasound is performed, showing an oval
circumscribed parallel nearly anechoic mass with scattered internal
echoes at the 10 o'clock position approximately 5 cm from the nipple
measuring approximately 4 x 6 x 6 mm, demonstrating posterior
acoustic enhancement and no internal power Doppler flow,
corresponding to the screening mammographic finding. No suspicious
solid mass or abnormal acoustic shadowing is identified.

LEFT: Tomosynthesis and synthesized spot-compression MLO view of the
area of concern in the LEFT breast and tomosynthesis and synthesized
full field mediolateral and laterally exaggerated CC views of the
LEFT breast were obtained.

The spot compression images demonstrate an approximate 8 mm isodense
mass which appears to have central fat. There is no associated
architectural distortion or suspicious calcifications. The mass was
not visible on the full field mediolateral images. The mass
localizes to the slight OUTER breast on the laterally exaggerated CC
images, indicating its location in the UPPER OUTER QUADRANT at far
POSTERIOR depth.

Mammographic images were processed with CAD.

Targeted LEFT breast ultrasound is performed, showing no correlate
for the mammographic mass in the UPPER OUTER QUADRANT at POSTERIOR
depth with imaging from 12 o'clock through 3 o'clock. No cyst, solid
mass or abnormal acoustic shadowing is identified.
IMPRESSION: 1. Likely benign intramammary lymph node in the UPPER OUTER LEFT
breast at far POSTERIOR depth without sonographic correlate,
accounting for the screening mammographic finding.
2. Benign cyst involving the UPPER OUTER QUADRANT of the RIGHT
breast, accounting for the screening mammographic finding.

RECOMMENDATION:
Diagnostic LEFT mammogram and possible LEFT breast ultrasound in 6
months.

I have discussed the findings and recommendations with the patient.
If applicable, a reminder letter will be sent to the patient
regarding the next appointment.

BI-RADS CATEGORY  3: Probably benign.

## 2020-07-31 DIAGNOSIS — N2581 Secondary hyperparathyroidism of renal origin: Secondary | ICD-10-CM | POA: Diagnosis not present

## 2020-07-31 DIAGNOSIS — N186 End stage renal disease: Secondary | ICD-10-CM | POA: Diagnosis not present

## 2020-07-31 DIAGNOSIS — Z992 Dependence on renal dialysis: Secondary | ICD-10-CM | POA: Diagnosis not present

## 2020-08-03 DIAGNOSIS — E876 Hypokalemia: Secondary | ICD-10-CM | POA: Diagnosis not present

## 2020-08-03 DIAGNOSIS — Z992 Dependence on renal dialysis: Secondary | ICD-10-CM | POA: Diagnosis not present

## 2020-08-03 DIAGNOSIS — N2581 Secondary hyperparathyroidism of renal origin: Secondary | ICD-10-CM | POA: Diagnosis not present

## 2020-08-03 DIAGNOSIS — N186 End stage renal disease: Secondary | ICD-10-CM | POA: Diagnosis not present

## 2020-08-05 DIAGNOSIS — N2581 Secondary hyperparathyroidism of renal origin: Secondary | ICD-10-CM | POA: Diagnosis not present

## 2020-08-05 DIAGNOSIS — E876 Hypokalemia: Secondary | ICD-10-CM | POA: Diagnosis not present

## 2020-08-05 DIAGNOSIS — N186 End stage renal disease: Secondary | ICD-10-CM | POA: Diagnosis not present

## 2020-08-05 DIAGNOSIS — Z992 Dependence on renal dialysis: Secondary | ICD-10-CM | POA: Diagnosis not present

## 2020-08-07 DIAGNOSIS — N2581 Secondary hyperparathyroidism of renal origin: Secondary | ICD-10-CM | POA: Diagnosis not present

## 2020-08-07 DIAGNOSIS — Z992 Dependence on renal dialysis: Secondary | ICD-10-CM | POA: Diagnosis not present

## 2020-08-07 DIAGNOSIS — N186 End stage renal disease: Secondary | ICD-10-CM | POA: Diagnosis not present

## 2020-08-07 DIAGNOSIS — E876 Hypokalemia: Secondary | ICD-10-CM | POA: Diagnosis not present

## 2020-08-10 DIAGNOSIS — N186 End stage renal disease: Secondary | ICD-10-CM | POA: Diagnosis not present

## 2020-08-10 DIAGNOSIS — N2581 Secondary hyperparathyroidism of renal origin: Secondary | ICD-10-CM | POA: Diagnosis not present

## 2020-08-10 DIAGNOSIS — Z992 Dependence on renal dialysis: Secondary | ICD-10-CM | POA: Diagnosis not present

## 2020-08-10 DIAGNOSIS — E876 Hypokalemia: Secondary | ICD-10-CM | POA: Diagnosis not present

## 2020-08-12 DIAGNOSIS — N186 End stage renal disease: Secondary | ICD-10-CM | POA: Diagnosis not present

## 2020-08-12 DIAGNOSIS — N2581 Secondary hyperparathyroidism of renal origin: Secondary | ICD-10-CM | POA: Diagnosis not present

## 2020-08-12 DIAGNOSIS — E876 Hypokalemia: Secondary | ICD-10-CM | POA: Diagnosis not present

## 2020-08-12 DIAGNOSIS — Z992 Dependence on renal dialysis: Secondary | ICD-10-CM | POA: Diagnosis not present

## 2020-08-14 DIAGNOSIS — E876 Hypokalemia: Secondary | ICD-10-CM | POA: Diagnosis not present

## 2020-08-14 DIAGNOSIS — Z992 Dependence on renal dialysis: Secondary | ICD-10-CM | POA: Diagnosis not present

## 2020-08-14 DIAGNOSIS — N2581 Secondary hyperparathyroidism of renal origin: Secondary | ICD-10-CM | POA: Diagnosis not present

## 2020-08-14 DIAGNOSIS — N186 End stage renal disease: Secondary | ICD-10-CM | POA: Diagnosis not present

## 2020-08-17 DIAGNOSIS — Z992 Dependence on renal dialysis: Secondary | ICD-10-CM | POA: Diagnosis not present

## 2020-08-17 DIAGNOSIS — N2581 Secondary hyperparathyroidism of renal origin: Secondary | ICD-10-CM | POA: Diagnosis not present

## 2020-08-17 DIAGNOSIS — E876 Hypokalemia: Secondary | ICD-10-CM | POA: Diagnosis not present

## 2020-08-17 DIAGNOSIS — N186 End stage renal disease: Secondary | ICD-10-CM | POA: Diagnosis not present

## 2020-08-19 DIAGNOSIS — E876 Hypokalemia: Secondary | ICD-10-CM | POA: Diagnosis not present

## 2020-08-19 DIAGNOSIS — N186 End stage renal disease: Secondary | ICD-10-CM | POA: Diagnosis not present

## 2020-08-19 DIAGNOSIS — N2581 Secondary hyperparathyroidism of renal origin: Secondary | ICD-10-CM | POA: Diagnosis not present

## 2020-08-19 DIAGNOSIS — Z992 Dependence on renal dialysis: Secondary | ICD-10-CM | POA: Diagnosis not present

## 2020-08-23 DIAGNOSIS — N2581 Secondary hyperparathyroidism of renal origin: Secondary | ICD-10-CM | POA: Diagnosis not present

## 2020-08-23 DIAGNOSIS — Z992 Dependence on renal dialysis: Secondary | ICD-10-CM | POA: Diagnosis not present

## 2020-08-23 DIAGNOSIS — N186 End stage renal disease: Secondary | ICD-10-CM | POA: Diagnosis not present

## 2020-08-24 DIAGNOSIS — N186 End stage renal disease: Secondary | ICD-10-CM | POA: Diagnosis not present

## 2020-08-24 DIAGNOSIS — Z992 Dependence on renal dialysis: Secondary | ICD-10-CM | POA: Diagnosis not present

## 2020-08-25 DIAGNOSIS — N2581 Secondary hyperparathyroidism of renal origin: Secondary | ICD-10-CM | POA: Diagnosis not present

## 2020-08-25 DIAGNOSIS — Z992 Dependence on renal dialysis: Secondary | ICD-10-CM | POA: Diagnosis not present

## 2020-08-25 DIAGNOSIS — N186 End stage renal disease: Secondary | ICD-10-CM | POA: Diagnosis not present

## 2020-08-26 DIAGNOSIS — Z992 Dependence on renal dialysis: Secondary | ICD-10-CM | POA: Diagnosis not present

## 2020-08-26 DIAGNOSIS — N186 End stage renal disease: Secondary | ICD-10-CM | POA: Diagnosis not present

## 2020-08-27 DIAGNOSIS — Z992 Dependence on renal dialysis: Secondary | ICD-10-CM | POA: Diagnosis not present

## 2020-08-27 DIAGNOSIS — N2581 Secondary hyperparathyroidism of renal origin: Secondary | ICD-10-CM | POA: Diagnosis not present

## 2020-08-27 DIAGNOSIS — N186 End stage renal disease: Secondary | ICD-10-CM | POA: Diagnosis not present

## 2020-08-30 DIAGNOSIS — Z992 Dependence on renal dialysis: Secondary | ICD-10-CM | POA: Diagnosis not present

## 2020-08-30 DIAGNOSIS — N2581 Secondary hyperparathyroidism of renal origin: Secondary | ICD-10-CM | POA: Diagnosis not present

## 2020-08-30 DIAGNOSIS — N186 End stage renal disease: Secondary | ICD-10-CM | POA: Diagnosis not present

## 2020-08-31 IMAGING — CT CT ABD-PELV W/ CM
2 of 5 series · 15 of 46 positions shown, 17 images · IV contrast (omnipaque)
Comparison: CT abdomen pelvis dated 04/20/2018.

CLINICAL DATA: 48-year-old female with abdominal pain, fever and
leukocytosis.

EXAM:
CT ABDOMEN AND PELVIS WITH CONTRAST
TECHNIQUE: Multidetector CT imaging of the abdomen and pelvis was performed
using the standard protocol following bolus administration of
intravenous contrast.
CONTRAST:  100mL OMNIPAQUE IOHEXOL 300 MG/ML  SOLN

[Series 3: abd/ pelvis 5.0 i30f 2 · axial · 0.96mm/px · z∈[+587,+1012]mm · 12 of 96 slices shown, 14 images]
[im 6/96  soft-tissue]
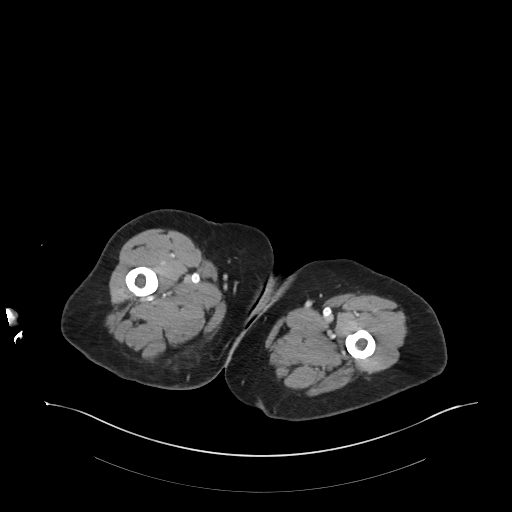
[im 6/96  bone]
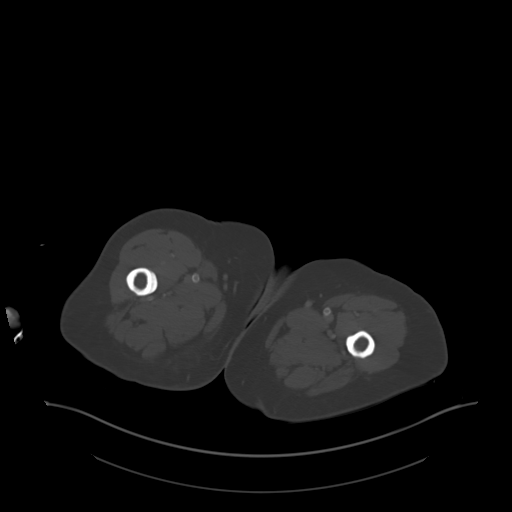
[im 16/96  soft-tissue]
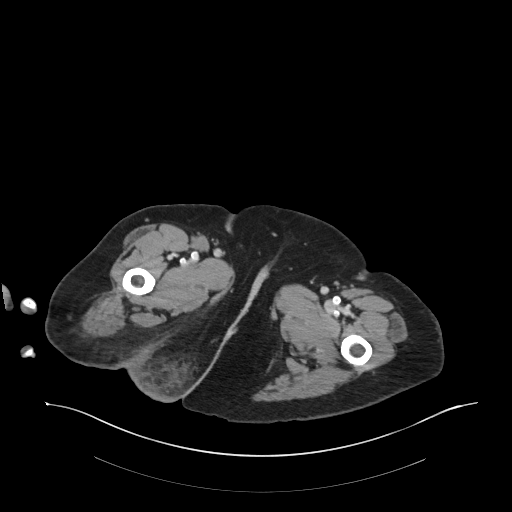
[im 21/96  soft-tissue]
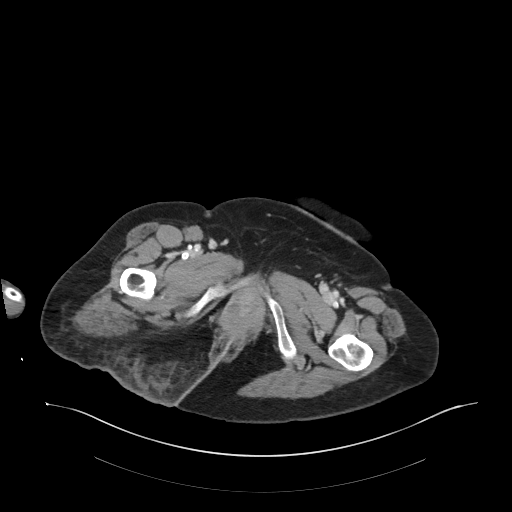
[im 31/96  soft-tissue]
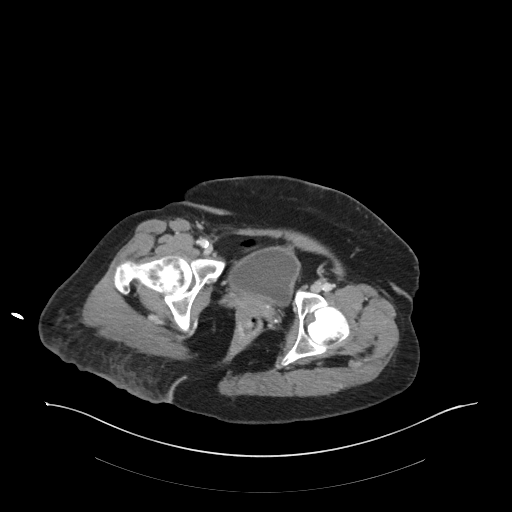
[im 36/96  soft-tissue]
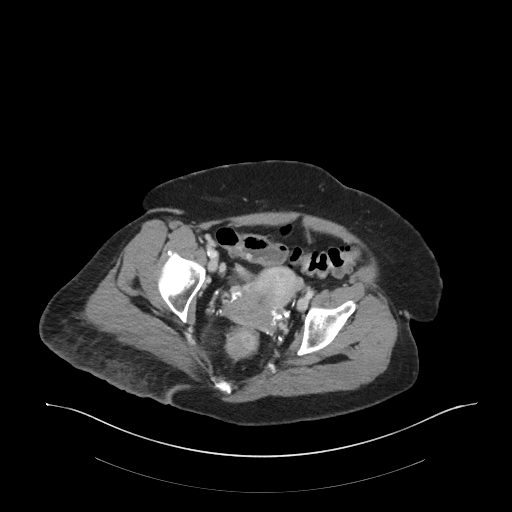
[im 46/96  soft-tissue]
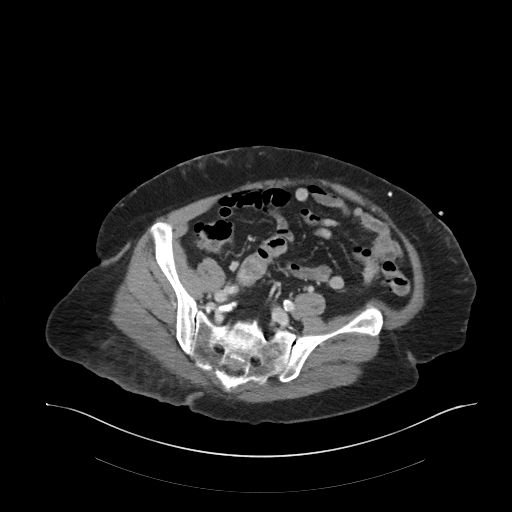
[im 51/96  soft-tissue]
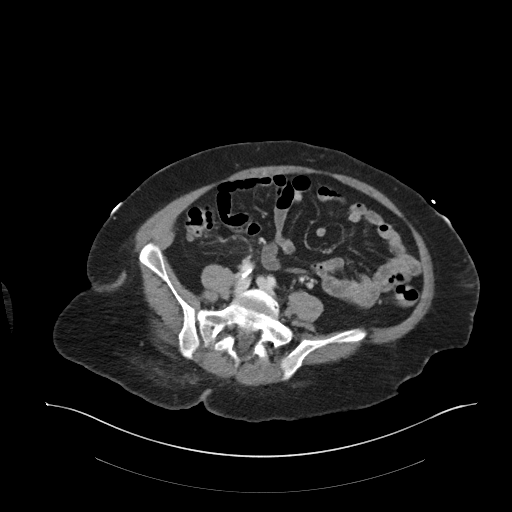
[im 61/96  soft-tissue]
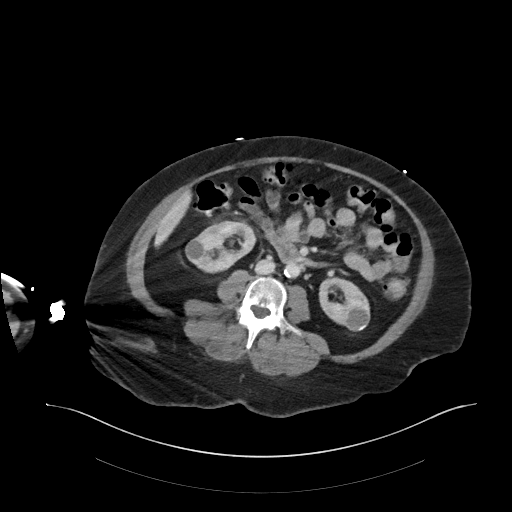
[im 66/96  soft-tissue]
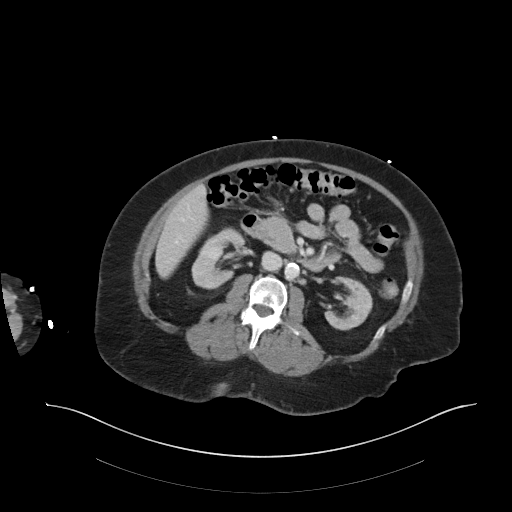
[im 66/96  bone]
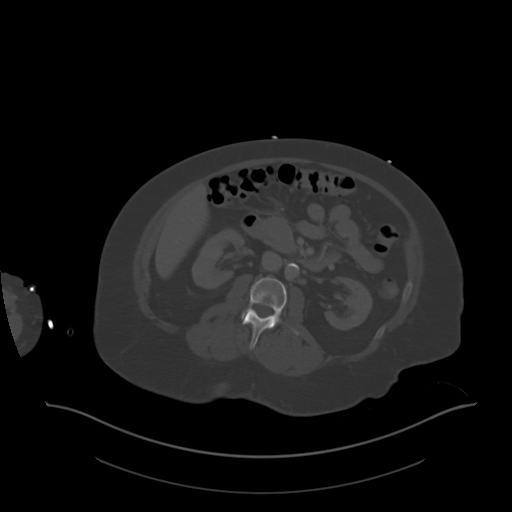
[im 76/96  soft-tissue]
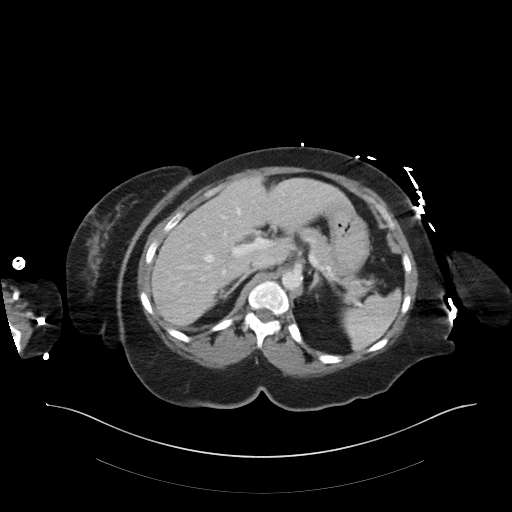
[im 81/96  soft-tissue]
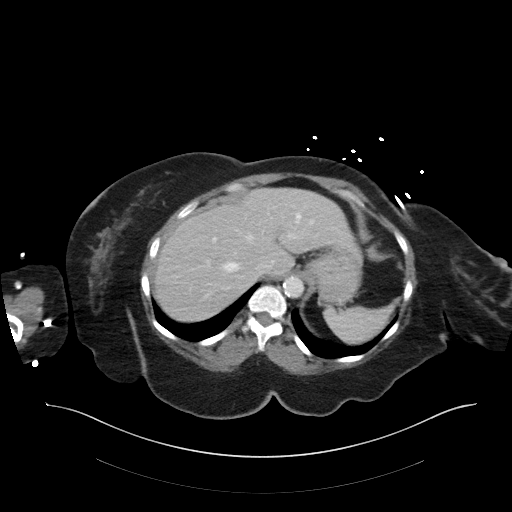
[im 91/96  soft-tissue]
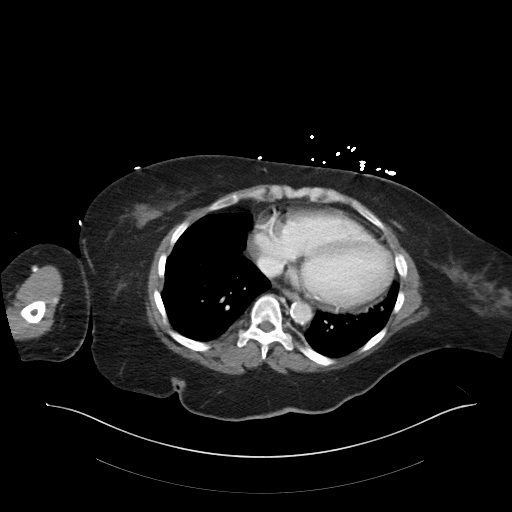

[Series 6: coronal soft tissue · coronal · 0.93mm/px · 3 of 110 slices shown]
[im 37/110  soft-tissue]
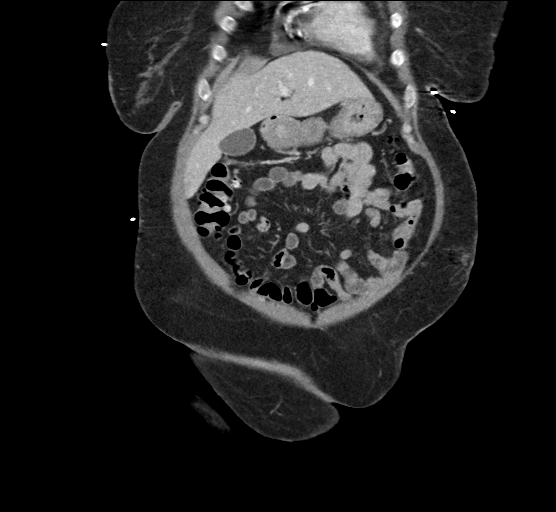
[im 49/110  soft-tissue]
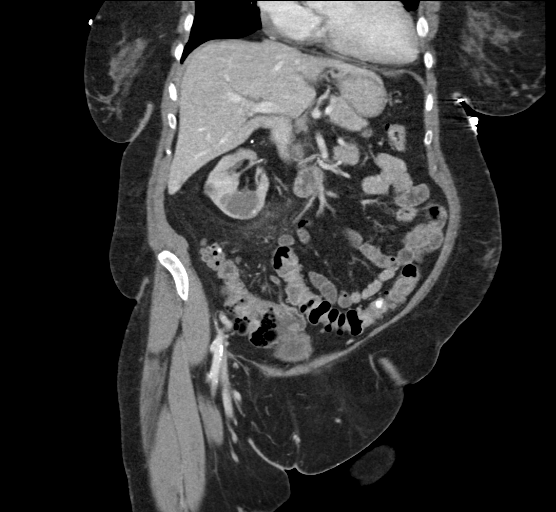
[im 61/110  soft-tissue]
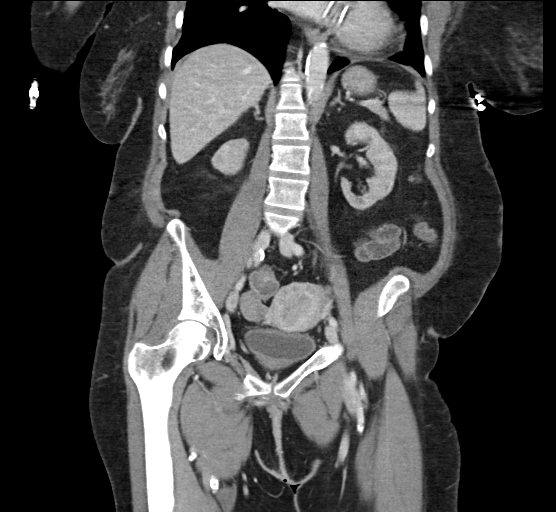

[15 of 46 positions shown; findings below may reference images not displayed]

FINDINGS: Lower chest: The visualized lung bases are clear. Multi vessel
coronary vascular calcifications noted.

Intra-abdominal free air or free fluid.

Hepatobiliary: The liver is unremarkable. No intrahepatic biliary
ductal dilatation. The gallbladder is unremarkable.

Pancreas: Unremarkable. No pancreatic ductal dilatation or
surrounding inflammatory changes.

Spleen: Normal in size without focal abnormality.

Adrenals/Urinary Tract: The adrenal glands are unremarkable. There
are bilateral renal cystic lesions as well as additional
subcentimeter hypodense lesions which are too small to characterize.
There is a 3 cm low attenuating lesion in the inferior pole of the
right kidney similar to prior CT. There is slight higher attenuation
of this cystic lesion likely represents a complex cyst with internal
debris. There is stranding of the right inferior perinephric fat
concerning for edema possibly related to cyst infection. This has
slightly increased since the prior CT. Clinical correlation and
further evaluation with renal ultrasound recommended. Several
punctate calcific foci along the periphery of the right inferior
pole cyst may represent areas of wall calcification. A 2 cm cyst in
the interpolar aspect of the left kidney has a partially calcified
wall appears similar to prior CT. There is no hydronephrosis on
either side. Punctate nonobstructing bilateral renal calculi versus
renal vascular calcification. The visualized ureters and urinary
bladder appear unremarkable.

Stomach/Bowel: There is sigmoid diverticulosis without active
inflammatory changes. There is no bowel obstruction. The appendix is
normal.

Vascular/Lymphatic: Moderate aortoiliac atherosclerotic disease. The
IVC is unremarkable. No portal venous gas. There is no adenopathy.

Reproductive: The uterus is anteverted. Multiple uterine fibroids
noted. There is a 2 cm slightly higher attenuating lesion in the
right ovary which is not well characterized. Pelvic ultrasound is
recommended for better evaluation on a nonemergent/outpatient basis.
The left ovary is unremarkable.

Other: There is stranding and edema of the subcutaneous soft tissues
of the right gluteal region and right buttock. No fluid collection.

Musculoskeletal: No acute or significant osseous findings.
IMPRESSION: 1. Stranding of the right inferior perinephric fat concerning for
edema possibly related to cyst infection or pyelonephritis. Clinical
correlation and further evaluation with renal ultrasound
recommended.
2. Sigmoid diverticulosis. No bowel obstruction. Normal appendix.
3. A 2 cm slightly higher attenuating lesion in the right ovary is
not well characterized. Pelvic ultrasound is recommended for better
evaluation on a nonemergent/outpatient basis.
4. Right gluteal subcutaneous edema and stranding. No fluid
collection.
5. Aortic Atherosclerosis ([id number]).

## 2020-09-01 DIAGNOSIS — Z992 Dependence on renal dialysis: Secondary | ICD-10-CM | POA: Diagnosis not present

## 2020-09-01 DIAGNOSIS — N2581 Secondary hyperparathyroidism of renal origin: Secondary | ICD-10-CM | POA: Diagnosis not present

## 2020-09-01 DIAGNOSIS — N186 End stage renal disease: Secondary | ICD-10-CM | POA: Diagnosis not present

## 2020-09-03 DIAGNOSIS — Z992 Dependence on renal dialysis: Secondary | ICD-10-CM | POA: Diagnosis not present

## 2020-09-03 DIAGNOSIS — N2581 Secondary hyperparathyroidism of renal origin: Secondary | ICD-10-CM | POA: Diagnosis not present

## 2020-09-03 DIAGNOSIS — N186 End stage renal disease: Secondary | ICD-10-CM | POA: Diagnosis not present

## 2020-09-06 DIAGNOSIS — D631 Anemia in chronic kidney disease: Secondary | ICD-10-CM | POA: Diagnosis not present

## 2020-09-06 DIAGNOSIS — N2581 Secondary hyperparathyroidism of renal origin: Secondary | ICD-10-CM | POA: Diagnosis not present

## 2020-09-06 DIAGNOSIS — N186 End stage renal disease: Secondary | ICD-10-CM | POA: Diagnosis not present

## 2020-09-06 DIAGNOSIS — Z992 Dependence on renal dialysis: Secondary | ICD-10-CM | POA: Diagnosis not present

## 2020-09-08 DIAGNOSIS — Z992 Dependence on renal dialysis: Secondary | ICD-10-CM | POA: Diagnosis not present

## 2020-09-08 DIAGNOSIS — D631 Anemia in chronic kidney disease: Secondary | ICD-10-CM | POA: Diagnosis not present

## 2020-09-08 DIAGNOSIS — N2581 Secondary hyperparathyroidism of renal origin: Secondary | ICD-10-CM | POA: Diagnosis not present

## 2020-09-08 DIAGNOSIS — N186 End stage renal disease: Secondary | ICD-10-CM | POA: Diagnosis not present

## 2020-09-09 DIAGNOSIS — I10 Essential (primary) hypertension: Secondary | ICD-10-CM | POA: Diagnosis not present

## 2020-09-09 DIAGNOSIS — M3210 Systemic lupus erythematosus, organ or system involvement unspecified: Secondary | ICD-10-CM | POA: Diagnosis not present

## 2020-09-09 DIAGNOSIS — H35033 Hypertensive retinopathy, bilateral: Secondary | ICD-10-CM | POA: Diagnosis not present

## 2020-09-09 DIAGNOSIS — H4311 Vitreous hemorrhage, right eye: Secondary | ICD-10-CM | POA: Diagnosis not present

## 2020-09-09 DIAGNOSIS — H35061 Retinal vasculitis, right eye: Secondary | ICD-10-CM | POA: Diagnosis not present

## 2020-09-09 DIAGNOSIS — E119 Type 2 diabetes mellitus without complications: Secondary | ICD-10-CM | POA: Diagnosis not present

## 2020-09-09 DIAGNOSIS — Z794 Long term (current) use of insulin: Secondary | ICD-10-CM | POA: Diagnosis not present

## 2020-09-09 DIAGNOSIS — B0233 Zoster keratitis: Secondary | ICD-10-CM | POA: Diagnosis not present

## 2020-09-10 DIAGNOSIS — D631 Anemia in chronic kidney disease: Secondary | ICD-10-CM | POA: Diagnosis not present

## 2020-09-10 DIAGNOSIS — N186 End stage renal disease: Secondary | ICD-10-CM | POA: Diagnosis not present

## 2020-09-10 DIAGNOSIS — Z992 Dependence on renal dialysis: Secondary | ICD-10-CM | POA: Diagnosis not present

## 2020-09-10 DIAGNOSIS — N2581 Secondary hyperparathyroidism of renal origin: Secondary | ICD-10-CM | POA: Diagnosis not present

## 2020-09-13 DIAGNOSIS — M3210 Systemic lupus erythematosus, organ or system involvement unspecified: Secondary | ICD-10-CM | POA: Diagnosis not present

## 2020-09-13 DIAGNOSIS — B0233 Zoster keratitis: Secondary | ICD-10-CM | POA: Diagnosis not present

## 2020-09-13 DIAGNOSIS — N2581 Secondary hyperparathyroidism of renal origin: Secondary | ICD-10-CM | POA: Diagnosis not present

## 2020-09-13 DIAGNOSIS — N186 End stage renal disease: Secondary | ICD-10-CM | POA: Diagnosis not present

## 2020-09-13 DIAGNOSIS — Z992 Dependence on renal dialysis: Secondary | ICD-10-CM | POA: Diagnosis not present

## 2020-09-13 DIAGNOSIS — E876 Hypokalemia: Secondary | ICD-10-CM | POA: Diagnosis not present

## 2020-09-15 DIAGNOSIS — N2581 Secondary hyperparathyroidism of renal origin: Secondary | ICD-10-CM | POA: Diagnosis not present

## 2020-09-15 DIAGNOSIS — N186 End stage renal disease: Secondary | ICD-10-CM | POA: Diagnosis not present

## 2020-09-15 DIAGNOSIS — E876 Hypokalemia: Secondary | ICD-10-CM | POA: Diagnosis not present

## 2020-09-15 DIAGNOSIS — Z992 Dependence on renal dialysis: Secondary | ICD-10-CM | POA: Diagnosis not present

## 2020-09-17 DIAGNOSIS — N186 End stage renal disease: Secondary | ICD-10-CM | POA: Diagnosis not present

## 2020-09-17 DIAGNOSIS — N2581 Secondary hyperparathyroidism of renal origin: Secondary | ICD-10-CM | POA: Diagnosis not present

## 2020-09-17 DIAGNOSIS — E876 Hypokalemia: Secondary | ICD-10-CM | POA: Diagnosis not present

## 2020-09-17 DIAGNOSIS — Z992 Dependence on renal dialysis: Secondary | ICD-10-CM | POA: Diagnosis not present

## 2020-09-20 DIAGNOSIS — M3210 Systemic lupus erythematosus, organ or system involvement unspecified: Secondary | ICD-10-CM | POA: Diagnosis not present

## 2020-09-20 DIAGNOSIS — B0233 Zoster keratitis: Secondary | ICD-10-CM | POA: Diagnosis not present

## 2020-09-20 DIAGNOSIS — D631 Anemia in chronic kidney disease: Secondary | ICD-10-CM | POA: Diagnosis not present

## 2020-09-20 DIAGNOSIS — Z992 Dependence on renal dialysis: Secondary | ICD-10-CM | POA: Diagnosis not present

## 2020-09-20 DIAGNOSIS — N186 End stage renal disease: Secondary | ICD-10-CM | POA: Diagnosis not present

## 2020-09-20 DIAGNOSIS — N2581 Secondary hyperparathyroidism of renal origin: Secondary | ICD-10-CM | POA: Diagnosis not present

## 2020-09-22 ENCOUNTER — Other Ambulatory Visit: Payer: Medicare HMO

## 2020-09-22 DIAGNOSIS — N2581 Secondary hyperparathyroidism of renal origin: Secondary | ICD-10-CM | POA: Diagnosis not present

## 2020-09-22 DIAGNOSIS — D631 Anemia in chronic kidney disease: Secondary | ICD-10-CM | POA: Diagnosis not present

## 2020-09-22 DIAGNOSIS — Z992 Dependence on renal dialysis: Secondary | ICD-10-CM | POA: Diagnosis not present

## 2020-09-22 DIAGNOSIS — N186 End stage renal disease: Secondary | ICD-10-CM | POA: Diagnosis not present

## 2020-09-24 DIAGNOSIS — Z992 Dependence on renal dialysis: Secondary | ICD-10-CM | POA: Diagnosis not present

## 2020-09-24 DIAGNOSIS — N2581 Secondary hyperparathyroidism of renal origin: Secondary | ICD-10-CM | POA: Diagnosis not present

## 2020-09-24 DIAGNOSIS — N186 End stage renal disease: Secondary | ICD-10-CM | POA: Diagnosis not present

## 2020-09-24 DIAGNOSIS — D631 Anemia in chronic kidney disease: Secondary | ICD-10-CM | POA: Diagnosis not present

## 2020-09-27 DIAGNOSIS — Z992 Dependence on renal dialysis: Secondary | ICD-10-CM | POA: Diagnosis not present

## 2020-09-27 DIAGNOSIS — N186 End stage renal disease: Secondary | ICD-10-CM | POA: Diagnosis not present

## 2020-09-27 DIAGNOSIS — N2581 Secondary hyperparathyroidism of renal origin: Secondary | ICD-10-CM | POA: Diagnosis not present

## 2020-09-29 DIAGNOSIS — N2581 Secondary hyperparathyroidism of renal origin: Secondary | ICD-10-CM | POA: Diagnosis not present

## 2020-09-29 DIAGNOSIS — Z992 Dependence on renal dialysis: Secondary | ICD-10-CM | POA: Diagnosis not present

## 2020-09-29 DIAGNOSIS — N186 End stage renal disease: Secondary | ICD-10-CM | POA: Diagnosis not present

## 2020-09-30 DIAGNOSIS — Z940 Kidney transplant status: Secondary | ICD-10-CM | POA: Diagnosis not present

## 2020-09-30 DIAGNOSIS — S301XXA Contusion of abdominal wall, initial encounter: Secondary | ICD-10-CM | POA: Diagnosis not present

## 2020-09-30 DIAGNOSIS — E1121 Type 2 diabetes mellitus with diabetic nephropathy: Secondary | ICD-10-CM | POA: Diagnosis not present

## 2020-09-30 DIAGNOSIS — Z01818 Encounter for other preprocedural examination: Secondary | ICD-10-CM | POA: Diagnosis not present

## 2020-09-30 DIAGNOSIS — E1165 Type 2 diabetes mellitus with hyperglycemia: Secondary | ICD-10-CM | POA: Diagnosis not present

## 2020-09-30 DIAGNOSIS — M3214 Glomerular disease in systemic lupus erythematosus: Secondary | ICD-10-CM | POA: Diagnosis not present

## 2020-09-30 DIAGNOSIS — Z5181 Encounter for therapeutic drug level monitoring: Secondary | ICD-10-CM | POA: Diagnosis not present

## 2020-09-30 DIAGNOSIS — E875 Hyperkalemia: Secondary | ICD-10-CM | POA: Diagnosis not present

## 2020-09-30 DIAGNOSIS — D72829 Elevated white blood cell count, unspecified: Secondary | ICD-10-CM | POA: Diagnosis not present

## 2020-09-30 DIAGNOSIS — G8918 Other acute postprocedural pain: Secondary | ICD-10-CM | POA: Diagnosis not present

## 2020-09-30 DIAGNOSIS — M79601 Pain in right arm: Secondary | ICD-10-CM | POA: Diagnosis not present

## 2020-09-30 DIAGNOSIS — N049 Nephrotic syndrome with unspecified morphologic changes: Secondary | ICD-10-CM | POA: Diagnosis not present

## 2020-09-30 DIAGNOSIS — N99841 Postprocedural hematoma of a genitourinary system organ or structure following other procedure: Secondary | ICD-10-CM | POA: Diagnosis not present

## 2020-09-30 DIAGNOSIS — E1122 Type 2 diabetes mellitus with diabetic chronic kidney disease: Secondary | ICD-10-CM | POA: Diagnosis not present

## 2020-09-30 DIAGNOSIS — M329 Systemic lupus erythematosus, unspecified: Secondary | ICD-10-CM | POA: Diagnosis not present

## 2020-09-30 DIAGNOSIS — D649 Anemia, unspecified: Secondary | ICD-10-CM | POA: Diagnosis not present

## 2020-09-30 DIAGNOSIS — T8610 Unspecified complication of kidney transplant: Secondary | ICD-10-CM | POA: Diagnosis not present

## 2020-09-30 DIAGNOSIS — R590 Localized enlarged lymph nodes: Secondary | ICD-10-CM | POA: Diagnosis not present

## 2020-09-30 DIAGNOSIS — T8619 Other complication of kidney transplant: Secondary | ICD-10-CM | POA: Diagnosis not present

## 2020-09-30 DIAGNOSIS — I10 Essential (primary) hypertension: Secondary | ICD-10-CM | POA: Diagnosis not present

## 2020-09-30 DIAGNOSIS — Y830 Surgical operation with transplant of whole organ as the cause of abnormal reaction of the patient, or of later complication, without mention of misadventure at the time of the procedure: Secondary | ICD-10-CM | POA: Diagnosis not present

## 2020-09-30 DIAGNOSIS — R109 Unspecified abdominal pain: Secondary | ICD-10-CM | POA: Diagnosis not present

## 2020-09-30 DIAGNOSIS — D6832 Hemorrhagic disorder due to extrinsic circulating anticoagulants: Secondary | ICD-10-CM | POA: Diagnosis not present

## 2020-09-30 DIAGNOSIS — I2510 Atherosclerotic heart disease of native coronary artery without angina pectoris: Secondary | ICD-10-CM | POA: Diagnosis not present

## 2020-09-30 DIAGNOSIS — E785 Hyperlipidemia, unspecified: Secondary | ICD-10-CM | POA: Diagnosis not present

## 2020-09-30 DIAGNOSIS — I517 Cardiomegaly: Secondary | ICD-10-CM | POA: Diagnosis not present

## 2020-09-30 DIAGNOSIS — L7632 Postprocedural hematoma of skin and subcutaneous tissue following other procedure: Secondary | ICD-10-CM | POA: Diagnosis not present

## 2020-09-30 DIAGNOSIS — I120 Hypertensive chronic kidney disease with stage 5 chronic kidney disease or end stage renal disease: Secondary | ICD-10-CM | POA: Diagnosis not present

## 2020-09-30 DIAGNOSIS — N186 End stage renal disease: Secondary | ICD-10-CM | POA: Diagnosis not present

## 2020-10-01 DIAGNOSIS — Z940 Kidney transplant status: Secondary | ICD-10-CM | POA: Diagnosis not present

## 2020-10-01 DIAGNOSIS — I10 Essential (primary) hypertension: Secondary | ICD-10-CM | POA: Diagnosis not present

## 2020-10-01 DIAGNOSIS — E785 Hyperlipidemia, unspecified: Secondary | ICD-10-CM | POA: Diagnosis not present

## 2020-10-01 DIAGNOSIS — I2510 Atherosclerotic heart disease of native coronary artery without angina pectoris: Secondary | ICD-10-CM | POA: Diagnosis not present

## 2020-10-01 DIAGNOSIS — E875 Hyperkalemia: Secondary | ICD-10-CM | POA: Diagnosis not present

## 2020-10-01 DIAGNOSIS — Z5181 Encounter for therapeutic drug level monitoring: Secondary | ICD-10-CM | POA: Diagnosis not present

## 2020-10-01 DIAGNOSIS — E1165 Type 2 diabetes mellitus with hyperglycemia: Secondary | ICD-10-CM | POA: Diagnosis not present

## 2020-10-01 DIAGNOSIS — N186 End stage renal disease: Secondary | ICD-10-CM | POA: Diagnosis not present

## 2020-10-01 DIAGNOSIS — E1121 Type 2 diabetes mellitus with diabetic nephropathy: Secondary | ICD-10-CM | POA: Diagnosis not present

## 2020-10-02 DIAGNOSIS — Z5181 Encounter for therapeutic drug level monitoring: Secondary | ICD-10-CM | POA: Diagnosis not present

## 2020-10-02 DIAGNOSIS — E875 Hyperkalemia: Secondary | ICD-10-CM | POA: Diagnosis not present

## 2020-10-02 DIAGNOSIS — E1121 Type 2 diabetes mellitus with diabetic nephropathy: Secondary | ICD-10-CM | POA: Diagnosis not present

## 2020-10-02 DIAGNOSIS — E1165 Type 2 diabetes mellitus with hyperglycemia: Secondary | ICD-10-CM | POA: Diagnosis not present

## 2020-10-02 DIAGNOSIS — I2510 Atherosclerotic heart disease of native coronary artery without angina pectoris: Secondary | ICD-10-CM | POA: Diagnosis not present

## 2020-10-02 DIAGNOSIS — I10 Essential (primary) hypertension: Secondary | ICD-10-CM | POA: Diagnosis not present

## 2020-10-02 DIAGNOSIS — N186 End stage renal disease: Secondary | ICD-10-CM | POA: Diagnosis not present

## 2020-10-02 DIAGNOSIS — E785 Hyperlipidemia, unspecified: Secondary | ICD-10-CM | POA: Diagnosis not present

## 2020-10-02 DIAGNOSIS — Z940 Kidney transplant status: Secondary | ICD-10-CM | POA: Diagnosis not present

## 2020-10-03 DIAGNOSIS — Z5181 Encounter for therapeutic drug level monitoring: Secondary | ICD-10-CM | POA: Diagnosis not present

## 2020-10-03 DIAGNOSIS — E1121 Type 2 diabetes mellitus with diabetic nephropathy: Secondary | ICD-10-CM | POA: Diagnosis not present

## 2020-10-03 DIAGNOSIS — E875 Hyperkalemia: Secondary | ICD-10-CM | POA: Diagnosis not present

## 2020-10-03 DIAGNOSIS — I10 Essential (primary) hypertension: Secondary | ICD-10-CM | POA: Diagnosis not present

## 2020-10-03 DIAGNOSIS — Z940 Kidney transplant status: Secondary | ICD-10-CM | POA: Diagnosis not present

## 2020-10-03 DIAGNOSIS — E1165 Type 2 diabetes mellitus with hyperglycemia: Secondary | ICD-10-CM | POA: Diagnosis not present

## 2020-10-03 DIAGNOSIS — E785 Hyperlipidemia, unspecified: Secondary | ICD-10-CM | POA: Diagnosis not present

## 2020-10-03 DIAGNOSIS — I2510 Atherosclerotic heart disease of native coronary artery without angina pectoris: Secondary | ICD-10-CM | POA: Diagnosis not present

## 2020-10-03 DIAGNOSIS — N186 End stage renal disease: Secondary | ICD-10-CM | POA: Diagnosis not present

## 2020-10-04 DIAGNOSIS — I2510 Atherosclerotic heart disease of native coronary artery without angina pectoris: Secondary | ICD-10-CM | POA: Diagnosis not present

## 2020-10-04 DIAGNOSIS — I10 Essential (primary) hypertension: Secondary | ICD-10-CM | POA: Diagnosis not present

## 2020-10-04 DIAGNOSIS — E785 Hyperlipidemia, unspecified: Secondary | ICD-10-CM | POA: Diagnosis not present

## 2020-10-04 DIAGNOSIS — E1121 Type 2 diabetes mellitus with diabetic nephropathy: Secondary | ICD-10-CM | POA: Diagnosis not present

## 2020-10-04 DIAGNOSIS — Z5181 Encounter for therapeutic drug level monitoring: Secondary | ICD-10-CM | POA: Diagnosis not present

## 2020-10-04 DIAGNOSIS — E1165 Type 2 diabetes mellitus with hyperglycemia: Secondary | ICD-10-CM | POA: Diagnosis not present

## 2020-10-04 DIAGNOSIS — Z940 Kidney transplant status: Secondary | ICD-10-CM | POA: Diagnosis not present

## 2020-10-04 DIAGNOSIS — N186 End stage renal disease: Secondary | ICD-10-CM | POA: Diagnosis not present

## 2020-10-04 DIAGNOSIS — M79601 Pain in right arm: Secondary | ICD-10-CM | POA: Diagnosis not present

## 2020-10-04 DIAGNOSIS — E875 Hyperkalemia: Secondary | ICD-10-CM | POA: Diagnosis not present

## 2020-10-05 DIAGNOSIS — E1165 Type 2 diabetes mellitus with hyperglycemia: Secondary | ICD-10-CM | POA: Diagnosis not present

## 2020-10-05 DIAGNOSIS — I10 Essential (primary) hypertension: Secondary | ICD-10-CM | POA: Diagnosis not present

## 2020-10-05 DIAGNOSIS — Z940 Kidney transplant status: Secondary | ICD-10-CM | POA: Diagnosis not present

## 2020-10-05 DIAGNOSIS — I2510 Atherosclerotic heart disease of native coronary artery without angina pectoris: Secondary | ICD-10-CM | POA: Diagnosis not present

## 2020-10-05 DIAGNOSIS — E1121 Type 2 diabetes mellitus with diabetic nephropathy: Secondary | ICD-10-CM | POA: Diagnosis not present

## 2020-10-05 DIAGNOSIS — E785 Hyperlipidemia, unspecified: Secondary | ICD-10-CM | POA: Diagnosis not present

## 2020-10-05 DIAGNOSIS — Z5181 Encounter for therapeutic drug level monitoring: Secondary | ICD-10-CM | POA: Diagnosis not present

## 2020-10-05 DIAGNOSIS — E875 Hyperkalemia: Secondary | ICD-10-CM | POA: Diagnosis not present

## 2020-10-05 DIAGNOSIS — N186 End stage renal disease: Secondary | ICD-10-CM | POA: Diagnosis not present

## 2020-10-06 DIAGNOSIS — I10 Essential (primary) hypertension: Secondary | ICD-10-CM | POA: Diagnosis not present

## 2020-10-06 DIAGNOSIS — E1165 Type 2 diabetes mellitus with hyperglycemia: Secondary | ICD-10-CM | POA: Diagnosis not present

## 2020-10-06 DIAGNOSIS — I2510 Atherosclerotic heart disease of native coronary artery without angina pectoris: Secondary | ICD-10-CM | POA: Diagnosis not present

## 2020-10-06 DIAGNOSIS — E1121 Type 2 diabetes mellitus with diabetic nephropathy: Secondary | ICD-10-CM | POA: Diagnosis not present

## 2020-10-06 DIAGNOSIS — N186 End stage renal disease: Secondary | ICD-10-CM | POA: Diagnosis not present

## 2020-10-06 DIAGNOSIS — E785 Hyperlipidemia, unspecified: Secondary | ICD-10-CM | POA: Diagnosis not present

## 2020-10-06 DIAGNOSIS — E875 Hyperkalemia: Secondary | ICD-10-CM | POA: Diagnosis not present

## 2020-10-06 DIAGNOSIS — Z5181 Encounter for therapeutic drug level monitoring: Secondary | ICD-10-CM | POA: Diagnosis not present

## 2020-10-06 DIAGNOSIS — Z940 Kidney transplant status: Secondary | ICD-10-CM | POA: Diagnosis not present

## 2020-10-06 DIAGNOSIS — S301XXA Contusion of abdominal wall, initial encounter: Secondary | ICD-10-CM | POA: Diagnosis not present

## 2020-10-07 DIAGNOSIS — E785 Hyperlipidemia, unspecified: Secondary | ICD-10-CM | POA: Diagnosis not present

## 2020-10-07 DIAGNOSIS — E875 Hyperkalemia: Secondary | ICD-10-CM | POA: Diagnosis not present

## 2020-10-07 DIAGNOSIS — Z940 Kidney transplant status: Secondary | ICD-10-CM | POA: Diagnosis not present

## 2020-10-07 DIAGNOSIS — I10 Essential (primary) hypertension: Secondary | ICD-10-CM | POA: Diagnosis not present

## 2020-10-07 DIAGNOSIS — I2510 Atherosclerotic heart disease of native coronary artery without angina pectoris: Secondary | ICD-10-CM | POA: Diagnosis not present

## 2020-10-07 DIAGNOSIS — E1121 Type 2 diabetes mellitus with diabetic nephropathy: Secondary | ICD-10-CM | POA: Diagnosis not present

## 2020-10-07 DIAGNOSIS — E1165 Type 2 diabetes mellitus with hyperglycemia: Secondary | ICD-10-CM | POA: Diagnosis not present

## 2020-10-07 DIAGNOSIS — N186 End stage renal disease: Secondary | ICD-10-CM | POA: Diagnosis not present

## 2020-10-07 DIAGNOSIS — Z5181 Encounter for therapeutic drug level monitoring: Secondary | ICD-10-CM | POA: Diagnosis not present

## 2020-10-08 DIAGNOSIS — E1165 Type 2 diabetes mellitus with hyperglycemia: Secondary | ICD-10-CM | POA: Diagnosis not present

## 2020-10-08 DIAGNOSIS — D649 Anemia, unspecified: Secondary | ICD-10-CM | POA: Diagnosis not present

## 2020-10-08 DIAGNOSIS — N186 End stage renal disease: Secondary | ICD-10-CM | POA: Diagnosis not present

## 2020-10-08 DIAGNOSIS — Z940 Kidney transplant status: Secondary | ICD-10-CM | POA: Diagnosis not present

## 2020-10-08 DIAGNOSIS — E875 Hyperkalemia: Secondary | ICD-10-CM | POA: Diagnosis not present

## 2020-10-08 DIAGNOSIS — E1121 Type 2 diabetes mellitus with diabetic nephropathy: Secondary | ICD-10-CM | POA: Diagnosis not present

## 2020-10-08 DIAGNOSIS — I2510 Atherosclerotic heart disease of native coronary artery without angina pectoris: Secondary | ICD-10-CM | POA: Diagnosis not present

## 2020-10-08 DIAGNOSIS — Z5181 Encounter for therapeutic drug level monitoring: Secondary | ICD-10-CM | POA: Diagnosis not present

## 2020-10-08 DIAGNOSIS — I10 Essential (primary) hypertension: Secondary | ICD-10-CM | POA: Diagnosis not present

## 2020-10-08 DIAGNOSIS — E785 Hyperlipidemia, unspecified: Secondary | ICD-10-CM | POA: Diagnosis not present

## 2020-10-09 DIAGNOSIS — E875 Hyperkalemia: Secondary | ICD-10-CM | POA: Diagnosis not present

## 2020-10-09 DIAGNOSIS — I10 Essential (primary) hypertension: Secondary | ICD-10-CM | POA: Diagnosis not present

## 2020-10-09 DIAGNOSIS — E1121 Type 2 diabetes mellitus with diabetic nephropathy: Secondary | ICD-10-CM | POA: Diagnosis not present

## 2020-10-09 DIAGNOSIS — E785 Hyperlipidemia, unspecified: Secondary | ICD-10-CM | POA: Diagnosis not present

## 2020-10-09 DIAGNOSIS — Z940 Kidney transplant status: Secondary | ICD-10-CM | POA: Diagnosis not present

## 2020-10-09 DIAGNOSIS — E1165 Type 2 diabetes mellitus with hyperglycemia: Secondary | ICD-10-CM | POA: Diagnosis not present

## 2020-10-09 DIAGNOSIS — Z5181 Encounter for therapeutic drug level monitoring: Secondary | ICD-10-CM | POA: Diagnosis not present

## 2020-10-09 DIAGNOSIS — N186 End stage renal disease: Secondary | ICD-10-CM | POA: Diagnosis not present

## 2020-10-09 DIAGNOSIS — I2510 Atherosclerotic heart disease of native coronary artery without angina pectoris: Secondary | ICD-10-CM | POA: Diagnosis not present

## 2020-10-09 DIAGNOSIS — D72829 Elevated white blood cell count, unspecified: Secondary | ICD-10-CM | POA: Diagnosis not present

## 2020-10-10 DIAGNOSIS — N186 End stage renal disease: Secondary | ICD-10-CM | POA: Diagnosis not present

## 2020-10-10 DIAGNOSIS — I10 Essential (primary) hypertension: Secondary | ICD-10-CM | POA: Diagnosis not present

## 2020-10-10 DIAGNOSIS — E785 Hyperlipidemia, unspecified: Secondary | ICD-10-CM | POA: Diagnosis not present

## 2020-10-10 DIAGNOSIS — I2510 Atherosclerotic heart disease of native coronary artery without angina pectoris: Secondary | ICD-10-CM | POA: Diagnosis not present

## 2020-10-10 DIAGNOSIS — Z940 Kidney transplant status: Secondary | ICD-10-CM | POA: Diagnosis not present

## 2020-10-10 DIAGNOSIS — Z5181 Encounter for therapeutic drug level monitoring: Secondary | ICD-10-CM | POA: Diagnosis not present

## 2020-10-10 DIAGNOSIS — E1165 Type 2 diabetes mellitus with hyperglycemia: Secondary | ICD-10-CM | POA: Diagnosis not present

## 2020-10-10 DIAGNOSIS — E875 Hyperkalemia: Secondary | ICD-10-CM | POA: Diagnosis not present

## 2020-10-10 DIAGNOSIS — E1121 Type 2 diabetes mellitus with diabetic nephropathy: Secondary | ICD-10-CM | POA: Diagnosis not present

## 2020-10-11 DIAGNOSIS — I2510 Atherosclerotic heart disease of native coronary artery without angina pectoris: Secondary | ICD-10-CM | POA: Diagnosis not present

## 2020-10-11 DIAGNOSIS — E1121 Type 2 diabetes mellitus with diabetic nephropathy: Secondary | ICD-10-CM | POA: Diagnosis not present

## 2020-10-11 DIAGNOSIS — E785 Hyperlipidemia, unspecified: Secondary | ICD-10-CM | POA: Diagnosis not present

## 2020-10-11 DIAGNOSIS — Z5181 Encounter for therapeutic drug level monitoring: Secondary | ICD-10-CM | POA: Diagnosis not present

## 2020-10-11 DIAGNOSIS — N186 End stage renal disease: Secondary | ICD-10-CM | POA: Diagnosis not present

## 2020-10-11 DIAGNOSIS — I10 Essential (primary) hypertension: Secondary | ICD-10-CM | POA: Diagnosis not present

## 2020-10-11 DIAGNOSIS — Z940 Kidney transplant status: Secondary | ICD-10-CM | POA: Diagnosis not present

## 2020-10-11 DIAGNOSIS — E875 Hyperkalemia: Secondary | ICD-10-CM | POA: Diagnosis not present

## 2020-10-11 DIAGNOSIS — D72829 Elevated white blood cell count, unspecified: Secondary | ICD-10-CM | POA: Diagnosis not present

## 2020-10-11 DIAGNOSIS — E1165 Type 2 diabetes mellitus with hyperglycemia: Secondary | ICD-10-CM | POA: Diagnosis not present

## 2020-10-12 DIAGNOSIS — N186 End stage renal disease: Secondary | ICD-10-CM | POA: Diagnosis not present

## 2020-10-12 DIAGNOSIS — Z5181 Encounter for therapeutic drug level monitoring: Secondary | ICD-10-CM | POA: Diagnosis not present

## 2020-10-12 DIAGNOSIS — Z940 Kidney transplant status: Secondary | ICD-10-CM | POA: Diagnosis not present

## 2020-10-12 DIAGNOSIS — I2510 Atherosclerotic heart disease of native coronary artery without angina pectoris: Secondary | ICD-10-CM | POA: Diagnosis not present

## 2020-10-12 DIAGNOSIS — D72829 Elevated white blood cell count, unspecified: Secondary | ICD-10-CM | POA: Diagnosis not present

## 2020-10-12 DIAGNOSIS — E875 Hyperkalemia: Secondary | ICD-10-CM | POA: Diagnosis not present

## 2020-10-12 DIAGNOSIS — E785 Hyperlipidemia, unspecified: Secondary | ICD-10-CM | POA: Diagnosis not present

## 2020-10-12 DIAGNOSIS — E1165 Type 2 diabetes mellitus with hyperglycemia: Secondary | ICD-10-CM | POA: Diagnosis not present

## 2020-10-12 DIAGNOSIS — I10 Essential (primary) hypertension: Secondary | ICD-10-CM | POA: Diagnosis not present

## 2020-10-12 DIAGNOSIS — E1121 Type 2 diabetes mellitus with diabetic nephropathy: Secondary | ICD-10-CM | POA: Diagnosis not present

## 2020-10-13 DIAGNOSIS — E1121 Type 2 diabetes mellitus with diabetic nephropathy: Secondary | ICD-10-CM | POA: Diagnosis not present

## 2020-10-13 DIAGNOSIS — I2510 Atherosclerotic heart disease of native coronary artery without angina pectoris: Secondary | ICD-10-CM | POA: Diagnosis not present

## 2020-10-13 DIAGNOSIS — N186 End stage renal disease: Secondary | ICD-10-CM | POA: Diagnosis not present

## 2020-10-13 DIAGNOSIS — Z5181 Encounter for therapeutic drug level monitoring: Secondary | ICD-10-CM | POA: Diagnosis not present

## 2020-10-13 DIAGNOSIS — Z940 Kidney transplant status: Secondary | ICD-10-CM | POA: Diagnosis not present

## 2020-10-13 DIAGNOSIS — E1165 Type 2 diabetes mellitus with hyperglycemia: Secondary | ICD-10-CM | POA: Diagnosis not present

## 2020-10-13 DIAGNOSIS — E875 Hyperkalemia: Secondary | ICD-10-CM | POA: Diagnosis not present

## 2020-10-13 DIAGNOSIS — I10 Essential (primary) hypertension: Secondary | ICD-10-CM | POA: Diagnosis not present

## 2020-10-13 DIAGNOSIS — E785 Hyperlipidemia, unspecified: Secondary | ICD-10-CM | POA: Diagnosis not present

## 2020-10-13 DIAGNOSIS — D72829 Elevated white blood cell count, unspecified: Secondary | ICD-10-CM | POA: Diagnosis not present

## 2020-10-14 DIAGNOSIS — I2510 Atherosclerotic heart disease of native coronary artery without angina pectoris: Secondary | ICD-10-CM | POA: Diagnosis not present

## 2020-10-14 DIAGNOSIS — Z940 Kidney transplant status: Secondary | ICD-10-CM | POA: Diagnosis not present

## 2020-10-14 DIAGNOSIS — E1121 Type 2 diabetes mellitus with diabetic nephropathy: Secondary | ICD-10-CM | POA: Diagnosis not present

## 2020-10-14 DIAGNOSIS — E1165 Type 2 diabetes mellitus with hyperglycemia: Secondary | ICD-10-CM | POA: Diagnosis not present

## 2020-10-14 DIAGNOSIS — N186 End stage renal disease: Secondary | ICD-10-CM | POA: Diagnosis not present

## 2020-10-14 DIAGNOSIS — Z5181 Encounter for therapeutic drug level monitoring: Secondary | ICD-10-CM | POA: Diagnosis not present

## 2020-10-14 DIAGNOSIS — D72829 Elevated white blood cell count, unspecified: Secondary | ICD-10-CM | POA: Diagnosis not present

## 2020-10-14 DIAGNOSIS — E875 Hyperkalemia: Secondary | ICD-10-CM | POA: Diagnosis not present

## 2020-10-14 DIAGNOSIS — I10 Essential (primary) hypertension: Secondary | ICD-10-CM | POA: Diagnosis not present

## 2020-10-14 DIAGNOSIS — E785 Hyperlipidemia, unspecified: Secondary | ICD-10-CM | POA: Diagnosis not present

## 2020-10-15 DIAGNOSIS — E875 Hyperkalemia: Secondary | ICD-10-CM | POA: Diagnosis not present

## 2020-10-15 DIAGNOSIS — D72829 Elevated white blood cell count, unspecified: Secondary | ICD-10-CM | POA: Diagnosis not present

## 2020-10-15 DIAGNOSIS — E1121 Type 2 diabetes mellitus with diabetic nephropathy: Secondary | ICD-10-CM | POA: Diagnosis not present

## 2020-10-15 DIAGNOSIS — Z5181 Encounter for therapeutic drug level monitoring: Secondary | ICD-10-CM | POA: Diagnosis not present

## 2020-10-15 DIAGNOSIS — N186 End stage renal disease: Secondary | ICD-10-CM | POA: Diagnosis not present

## 2020-10-15 DIAGNOSIS — E785 Hyperlipidemia, unspecified: Secondary | ICD-10-CM | POA: Diagnosis not present

## 2020-10-15 DIAGNOSIS — Z940 Kidney transplant status: Secondary | ICD-10-CM | POA: Diagnosis not present

## 2020-10-15 DIAGNOSIS — I2510 Atherosclerotic heart disease of native coronary artery without angina pectoris: Secondary | ICD-10-CM | POA: Diagnosis not present

## 2020-10-15 DIAGNOSIS — N99841 Postprocedural hematoma of a genitourinary system organ or structure following other procedure: Secondary | ICD-10-CM | POA: Diagnosis not present

## 2020-10-15 DIAGNOSIS — E1165 Type 2 diabetes mellitus with hyperglycemia: Secondary | ICD-10-CM | POA: Diagnosis not present

## 2020-10-15 DIAGNOSIS — I10 Essential (primary) hypertension: Secondary | ICD-10-CM | POA: Diagnosis not present

## 2020-10-15 DIAGNOSIS — T8610 Unspecified complication of kidney transplant: Secondary | ICD-10-CM | POA: Diagnosis not present

## 2020-10-16 DIAGNOSIS — I2510 Atherosclerotic heart disease of native coronary artery without angina pectoris: Secondary | ICD-10-CM | POA: Diagnosis not present

## 2020-10-16 DIAGNOSIS — E1165 Type 2 diabetes mellitus with hyperglycemia: Secondary | ICD-10-CM | POA: Diagnosis not present

## 2020-10-16 DIAGNOSIS — Z940 Kidney transplant status: Secondary | ICD-10-CM | POA: Diagnosis not present

## 2020-10-16 DIAGNOSIS — T8619 Other complication of kidney transplant: Secondary | ICD-10-CM | POA: Diagnosis not present

## 2020-10-16 DIAGNOSIS — E785 Hyperlipidemia, unspecified: Secondary | ICD-10-CM | POA: Diagnosis not present

## 2020-10-16 DIAGNOSIS — I10 Essential (primary) hypertension: Secondary | ICD-10-CM | POA: Diagnosis not present

## 2020-10-16 DIAGNOSIS — D72829 Elevated white blood cell count, unspecified: Secondary | ICD-10-CM | POA: Diagnosis not present

## 2020-10-16 DIAGNOSIS — Z5181 Encounter for therapeutic drug level monitoring: Secondary | ICD-10-CM | POA: Diagnosis not present

## 2020-10-16 DIAGNOSIS — E1121 Type 2 diabetes mellitus with diabetic nephropathy: Secondary | ICD-10-CM | POA: Diagnosis not present

## 2020-10-16 DIAGNOSIS — E875 Hyperkalemia: Secondary | ICD-10-CM | POA: Diagnosis not present

## 2020-10-16 DIAGNOSIS — N186 End stage renal disease: Secondary | ICD-10-CM | POA: Diagnosis not present

## 2020-10-17 DIAGNOSIS — E1165 Type 2 diabetes mellitus with hyperglycemia: Secondary | ICD-10-CM | POA: Diagnosis not present

## 2020-10-17 DIAGNOSIS — E875 Hyperkalemia: Secondary | ICD-10-CM | POA: Diagnosis not present

## 2020-10-17 DIAGNOSIS — Z940 Kidney transplant status: Secondary | ICD-10-CM | POA: Diagnosis not present

## 2020-10-17 DIAGNOSIS — I2510 Atherosclerotic heart disease of native coronary artery without angina pectoris: Secondary | ICD-10-CM | POA: Diagnosis not present

## 2020-10-17 DIAGNOSIS — I10 Essential (primary) hypertension: Secondary | ICD-10-CM | POA: Diagnosis not present

## 2020-10-17 DIAGNOSIS — E1121 Type 2 diabetes mellitus with diabetic nephropathy: Secondary | ICD-10-CM | POA: Diagnosis not present

## 2020-10-17 DIAGNOSIS — D72829 Elevated white blood cell count, unspecified: Secondary | ICD-10-CM | POA: Diagnosis not present

## 2020-10-17 DIAGNOSIS — T8619 Other complication of kidney transplant: Secondary | ICD-10-CM | POA: Diagnosis not present

## 2020-10-17 DIAGNOSIS — N186 End stage renal disease: Secondary | ICD-10-CM | POA: Diagnosis not present

## 2020-10-17 DIAGNOSIS — Z5181 Encounter for therapeutic drug level monitoring: Secondary | ICD-10-CM | POA: Diagnosis not present

## 2020-10-17 DIAGNOSIS — E785 Hyperlipidemia, unspecified: Secondary | ICD-10-CM | POA: Diagnosis not present

## 2020-10-18 DIAGNOSIS — E1121 Type 2 diabetes mellitus with diabetic nephropathy: Secondary | ICD-10-CM | POA: Diagnosis not present

## 2020-10-18 DIAGNOSIS — Z940 Kidney transplant status: Secondary | ICD-10-CM | POA: Diagnosis not present

## 2020-10-18 DIAGNOSIS — Z5181 Encounter for therapeutic drug level monitoring: Secondary | ICD-10-CM | POA: Diagnosis not present

## 2020-10-18 DIAGNOSIS — E875 Hyperkalemia: Secondary | ICD-10-CM | POA: Diagnosis not present

## 2020-10-18 DIAGNOSIS — I10 Essential (primary) hypertension: Secondary | ICD-10-CM | POA: Diagnosis not present

## 2020-10-18 DIAGNOSIS — D72829 Elevated white blood cell count, unspecified: Secondary | ICD-10-CM | POA: Diagnosis not present

## 2020-10-18 DIAGNOSIS — I2510 Atherosclerotic heart disease of native coronary artery without angina pectoris: Secondary | ICD-10-CM | POA: Diagnosis not present

## 2020-10-18 DIAGNOSIS — E785 Hyperlipidemia, unspecified: Secondary | ICD-10-CM | POA: Diagnosis not present

## 2020-10-18 DIAGNOSIS — N186 End stage renal disease: Secondary | ICD-10-CM | POA: Diagnosis not present

## 2020-10-18 DIAGNOSIS — E1165 Type 2 diabetes mellitus with hyperglycemia: Secondary | ICD-10-CM | POA: Diagnosis not present

## 2020-10-18 DIAGNOSIS — T8619 Other complication of kidney transplant: Secondary | ICD-10-CM | POA: Diagnosis not present

## 2020-10-19 DIAGNOSIS — T8189XA Other complications of procedures, not elsewhere classified, initial encounter: Secondary | ICD-10-CM | POA: Diagnosis not present

## 2020-10-19 DIAGNOSIS — E1121 Type 2 diabetes mellitus with diabetic nephropathy: Secondary | ICD-10-CM | POA: Diagnosis not present

## 2020-10-19 DIAGNOSIS — T8619 Other complication of kidney transplant: Secondary | ICD-10-CM | POA: Diagnosis not present

## 2020-10-19 DIAGNOSIS — Z5181 Encounter for therapeutic drug level monitoring: Secondary | ICD-10-CM | POA: Diagnosis not present

## 2020-10-19 DIAGNOSIS — I10 Essential (primary) hypertension: Secondary | ICD-10-CM | POA: Diagnosis not present

## 2020-10-19 DIAGNOSIS — I2510 Atherosclerotic heart disease of native coronary artery without angina pectoris: Secondary | ICD-10-CM | POA: Diagnosis not present

## 2020-10-19 DIAGNOSIS — Z940 Kidney transplant status: Secondary | ICD-10-CM | POA: Diagnosis not present

## 2020-10-19 DIAGNOSIS — E785 Hyperlipidemia, unspecified: Secondary | ICD-10-CM | POA: Diagnosis not present

## 2020-10-19 DIAGNOSIS — E1165 Type 2 diabetes mellitus with hyperglycemia: Secondary | ICD-10-CM | POA: Diagnosis not present

## 2020-10-19 DIAGNOSIS — N186 End stage renal disease: Secondary | ICD-10-CM | POA: Diagnosis not present

## 2020-10-19 DIAGNOSIS — E875 Hyperkalemia: Secondary | ICD-10-CM | POA: Diagnosis not present

## 2020-10-19 DIAGNOSIS — D72829 Elevated white blood cell count, unspecified: Secondary | ICD-10-CM | POA: Diagnosis not present

## 2020-10-20 DIAGNOSIS — E785 Hyperlipidemia, unspecified: Secondary | ICD-10-CM | POA: Diagnosis not present

## 2020-10-20 DIAGNOSIS — I2510 Atherosclerotic heart disease of native coronary artery without angina pectoris: Secondary | ICD-10-CM | POA: Diagnosis not present

## 2020-10-20 DIAGNOSIS — Z4822 Encounter for aftercare following kidney transplant: Secondary | ICD-10-CM | POA: Diagnosis not present

## 2020-10-20 DIAGNOSIS — M3214 Glomerular disease in systemic lupus erythematosus: Secondary | ICD-10-CM | POA: Diagnosis not present

## 2020-10-20 DIAGNOSIS — L7622 Postprocedural hemorrhage and hematoma of skin and subcutaneous tissue following other procedure: Secondary | ICD-10-CM | POA: Diagnosis not present

## 2020-10-20 DIAGNOSIS — N186 End stage renal disease: Secondary | ICD-10-CM | POA: Diagnosis not present

## 2020-10-20 DIAGNOSIS — E1142 Type 2 diabetes mellitus with diabetic polyneuropathy: Secondary | ICD-10-CM | POA: Diagnosis not present

## 2020-10-20 DIAGNOSIS — Z4801 Encounter for change or removal of surgical wound dressing: Secondary | ICD-10-CM | POA: Diagnosis not present

## 2020-10-21 DIAGNOSIS — Z48298 Encounter for aftercare following other organ transplant: Secondary | ICD-10-CM | POA: Diagnosis not present

## 2020-10-21 DIAGNOSIS — Z79899 Other long term (current) drug therapy: Secondary | ICD-10-CM | POA: Diagnosis not present

## 2020-10-21 DIAGNOSIS — Z940 Kidney transplant status: Secondary | ICD-10-CM | POA: Diagnosis not present

## 2020-10-21 DIAGNOSIS — E785 Hyperlipidemia, unspecified: Secondary | ICD-10-CM | POA: Diagnosis not present

## 2020-10-21 DIAGNOSIS — M329 Systemic lupus erythematosus, unspecified: Secondary | ICD-10-CM | POA: Diagnosis not present

## 2020-10-21 DIAGNOSIS — I10 Essential (primary) hypertension: Secondary | ICD-10-CM | POA: Diagnosis not present

## 2020-10-21 DIAGNOSIS — E119 Type 2 diabetes mellitus without complications: Secondary | ICD-10-CM | POA: Diagnosis not present

## 2020-10-22 DIAGNOSIS — Z4801 Encounter for change or removal of surgical wound dressing: Secondary | ICD-10-CM | POA: Diagnosis not present

## 2020-10-22 DIAGNOSIS — L7622 Postprocedural hemorrhage and hematoma of skin and subcutaneous tissue following other procedure: Secondary | ICD-10-CM | POA: Diagnosis not present

## 2020-10-22 DIAGNOSIS — M3214 Glomerular disease in systemic lupus erythematosus: Secondary | ICD-10-CM | POA: Diagnosis not present

## 2020-10-22 DIAGNOSIS — Z4822 Encounter for aftercare following kidney transplant: Secondary | ICD-10-CM | POA: Diagnosis not present

## 2020-10-22 DIAGNOSIS — I2510 Atherosclerotic heart disease of native coronary artery without angina pectoris: Secondary | ICD-10-CM | POA: Diagnosis not present

## 2020-10-22 DIAGNOSIS — E1142 Type 2 diabetes mellitus with diabetic polyneuropathy: Secondary | ICD-10-CM | POA: Diagnosis not present

## 2020-10-22 DIAGNOSIS — N186 End stage renal disease: Secondary | ICD-10-CM | POA: Diagnosis not present

## 2020-10-22 DIAGNOSIS — E785 Hyperlipidemia, unspecified: Secondary | ICD-10-CM | POA: Diagnosis not present

## 2020-10-25 DIAGNOSIS — E1142 Type 2 diabetes mellitus with diabetic polyneuropathy: Secondary | ICD-10-CM | POA: Diagnosis not present

## 2020-10-25 DIAGNOSIS — Z4801 Encounter for change or removal of surgical wound dressing: Secondary | ICD-10-CM | POA: Diagnosis not present

## 2020-10-25 DIAGNOSIS — Z4822 Encounter for aftercare following kidney transplant: Secondary | ICD-10-CM | POA: Diagnosis not present

## 2020-10-25 DIAGNOSIS — L7622 Postprocedural hemorrhage and hematoma of skin and subcutaneous tissue following other procedure: Secondary | ICD-10-CM | POA: Diagnosis not present

## 2020-10-25 DIAGNOSIS — E785 Hyperlipidemia, unspecified: Secondary | ICD-10-CM | POA: Diagnosis not present

## 2020-10-25 DIAGNOSIS — Z79899 Other long term (current) drug therapy: Secondary | ICD-10-CM | POA: Diagnosis not present

## 2020-10-25 DIAGNOSIS — I2510 Atherosclerotic heart disease of native coronary artery without angina pectoris: Secondary | ICD-10-CM | POA: Diagnosis not present

## 2020-10-25 DIAGNOSIS — M3214 Glomerular disease in systemic lupus erythematosus: Secondary | ICD-10-CM | POA: Diagnosis not present

## 2020-10-25 DIAGNOSIS — N186 End stage renal disease: Secondary | ICD-10-CM | POA: Diagnosis not present

## 2020-10-25 DIAGNOSIS — Z940 Kidney transplant status: Secondary | ICD-10-CM | POA: Diagnosis not present

## 2020-10-27 DIAGNOSIS — E1142 Type 2 diabetes mellitus with diabetic polyneuropathy: Secondary | ICD-10-CM | POA: Diagnosis not present

## 2020-10-27 DIAGNOSIS — N186 End stage renal disease: Secondary | ICD-10-CM | POA: Diagnosis not present

## 2020-10-27 DIAGNOSIS — I2510 Atherosclerotic heart disease of native coronary artery without angina pectoris: Secondary | ICD-10-CM | POA: Diagnosis not present

## 2020-10-27 DIAGNOSIS — M3214 Glomerular disease in systemic lupus erythematosus: Secondary | ICD-10-CM | POA: Diagnosis not present

## 2020-10-27 DIAGNOSIS — L7622 Postprocedural hemorrhage and hematoma of skin and subcutaneous tissue following other procedure: Secondary | ICD-10-CM | POA: Diagnosis not present

## 2020-10-27 DIAGNOSIS — E785 Hyperlipidemia, unspecified: Secondary | ICD-10-CM | POA: Diagnosis not present

## 2020-10-27 DIAGNOSIS — Z4801 Encounter for change or removal of surgical wound dressing: Secondary | ICD-10-CM | POA: Diagnosis not present

## 2020-10-27 DIAGNOSIS — Z4822 Encounter for aftercare following kidney transplant: Secondary | ICD-10-CM | POA: Diagnosis not present

## 2020-10-29 DIAGNOSIS — I10 Essential (primary) hypertension: Secondary | ICD-10-CM | POA: Diagnosis not present

## 2020-10-29 DIAGNOSIS — E785 Hyperlipidemia, unspecified: Secondary | ICD-10-CM | POA: Diagnosis not present

## 2020-10-29 DIAGNOSIS — Z79899 Other long term (current) drug therapy: Secondary | ICD-10-CM | POA: Diagnosis not present

## 2020-10-29 DIAGNOSIS — M329 Systemic lupus erythematosus, unspecified: Secondary | ICD-10-CM | POA: Diagnosis not present

## 2020-10-29 DIAGNOSIS — L930 Discoid lupus erythematosus: Secondary | ICD-10-CM | POA: Diagnosis not present

## 2020-10-29 DIAGNOSIS — E119 Type 2 diabetes mellitus without complications: Secondary | ICD-10-CM | POA: Diagnosis not present

## 2020-10-29 DIAGNOSIS — N189 Chronic kidney disease, unspecified: Secondary | ICD-10-CM | POA: Diagnosis not present

## 2020-10-29 DIAGNOSIS — Z940 Kidney transplant status: Secondary | ICD-10-CM | POA: Diagnosis not present

## 2020-11-02 DIAGNOSIS — Z940 Kidney transplant status: Secondary | ICD-10-CM | POA: Diagnosis not present

## 2020-11-03 DIAGNOSIS — T8189XA Other complications of procedures, not elsewhere classified, initial encounter: Secondary | ICD-10-CM | POA: Diagnosis not present

## 2020-11-04 DIAGNOSIS — M329 Systemic lupus erythematosus, unspecified: Secondary | ICD-10-CM | POA: Diagnosis not present

## 2020-11-04 DIAGNOSIS — Z09 Encounter for follow-up examination after completed treatment for conditions other than malignant neoplasm: Secondary | ICD-10-CM | POA: Diagnosis not present

## 2020-11-04 DIAGNOSIS — E119 Type 2 diabetes mellitus without complications: Secondary | ICD-10-CM | POA: Diagnosis not present

## 2020-11-04 DIAGNOSIS — E785 Hyperlipidemia, unspecified: Secondary | ICD-10-CM | POA: Diagnosis not present

## 2020-11-04 DIAGNOSIS — I10 Essential (primary) hypertension: Secondary | ICD-10-CM | POA: Diagnosis not present

## 2020-11-04 DIAGNOSIS — Z940 Kidney transplant status: Secondary | ICD-10-CM | POA: Diagnosis not present

## 2020-11-04 DIAGNOSIS — N189 Chronic kidney disease, unspecified: Secondary | ICD-10-CM | POA: Diagnosis not present

## 2020-11-04 DIAGNOSIS — Z79899 Other long term (current) drug therapy: Secondary | ICD-10-CM | POA: Diagnosis not present

## 2020-11-04 DIAGNOSIS — D84821 Immunodeficiency due to drugs: Secondary | ICD-10-CM | POA: Diagnosis not present

## 2020-11-04 DIAGNOSIS — T8189XA Other complications of procedures, not elsewhere classified, initial encounter: Secondary | ICD-10-CM | POA: Diagnosis not present

## 2020-11-09 DIAGNOSIS — Z79899 Other long term (current) drug therapy: Secondary | ICD-10-CM | POA: Diagnosis not present

## 2020-11-09 DIAGNOSIS — Z940 Kidney transplant status: Secondary | ICD-10-CM | POA: Diagnosis not present

## 2020-11-11 DIAGNOSIS — Z0189 Encounter for other specified special examinations: Secondary | ICD-10-CM | POA: Diagnosis not present

## 2020-11-11 DIAGNOSIS — Z940 Kidney transplant status: Secondary | ICD-10-CM | POA: Diagnosis not present

## 2020-11-11 DIAGNOSIS — E785 Hyperlipidemia, unspecified: Secondary | ICD-10-CM | POA: Diagnosis not present

## 2020-11-11 DIAGNOSIS — R69 Illness, unspecified: Secondary | ICD-10-CM | POA: Diagnosis not present

## 2020-11-11 DIAGNOSIS — B182 Chronic viral hepatitis C: Secondary | ICD-10-CM | POA: Diagnosis not present

## 2020-11-11 DIAGNOSIS — B1910 Unspecified viral hepatitis B without hepatic coma: Secondary | ICD-10-CM | POA: Diagnosis not present

## 2020-11-11 DIAGNOSIS — M329 Systemic lupus erythematosus, unspecified: Secondary | ICD-10-CM | POA: Diagnosis not present

## 2020-11-11 DIAGNOSIS — E119 Type 2 diabetes mellitus without complications: Secondary | ICD-10-CM | POA: Diagnosis not present

## 2020-11-11 DIAGNOSIS — I10 Essential (primary) hypertension: Secondary | ICD-10-CM | POA: Diagnosis not present

## 2020-11-11 DIAGNOSIS — D84821 Immunodeficiency due to drugs: Secondary | ICD-10-CM | POA: Diagnosis not present

## 2020-11-11 DIAGNOSIS — Z79899 Other long term (current) drug therapy: Secondary | ICD-10-CM | POA: Diagnosis not present

## 2020-11-15 DIAGNOSIS — Z79899 Other long term (current) drug therapy: Secondary | ICD-10-CM | POA: Diagnosis not present

## 2020-11-15 DIAGNOSIS — Z940 Kidney transplant status: Secondary | ICD-10-CM | POA: Diagnosis not present

## 2020-11-18 DIAGNOSIS — Z79899 Other long term (current) drug therapy: Secondary | ICD-10-CM | POA: Diagnosis not present

## 2020-11-18 DIAGNOSIS — E785 Hyperlipidemia, unspecified: Secondary | ICD-10-CM | POA: Diagnosis not present

## 2020-11-18 DIAGNOSIS — E119 Type 2 diabetes mellitus without complications: Secondary | ICD-10-CM | POA: Diagnosis not present

## 2020-11-18 DIAGNOSIS — D84821 Immunodeficiency due to drugs: Secondary | ICD-10-CM | POA: Diagnosis not present

## 2020-11-18 DIAGNOSIS — I10 Essential (primary) hypertension: Secondary | ICD-10-CM | POA: Diagnosis not present

## 2020-11-18 DIAGNOSIS — Z09 Encounter for follow-up examination after completed treatment for conditions other than malignant neoplasm: Secondary | ICD-10-CM | POA: Diagnosis not present

## 2020-11-18 DIAGNOSIS — R0602 Shortness of breath: Secondary | ICD-10-CM | POA: Diagnosis not present

## 2020-11-18 DIAGNOSIS — Z940 Kidney transplant status: Secondary | ICD-10-CM | POA: Diagnosis not present

## 2020-11-18 DIAGNOSIS — L930 Discoid lupus erythematosus: Secondary | ICD-10-CM | POA: Diagnosis not present

## 2020-11-19 DIAGNOSIS — T8189XA Other complications of procedures, not elsewhere classified, initial encounter: Secondary | ICD-10-CM | POA: Diagnosis not present

## 2020-11-21 DIAGNOSIS — I2510 Atherosclerotic heart disease of native coronary artery without angina pectoris: Secondary | ICD-10-CM | POA: Diagnosis not present

## 2020-11-21 DIAGNOSIS — E785 Hyperlipidemia, unspecified: Secondary | ICD-10-CM | POA: Diagnosis not present

## 2020-11-21 DIAGNOSIS — E1122 Type 2 diabetes mellitus with diabetic chronic kidney disease: Secondary | ICD-10-CM | POA: Diagnosis not present

## 2020-11-21 DIAGNOSIS — Z940 Kidney transplant status: Secondary | ICD-10-CM | POA: Diagnosis not present

## 2020-11-21 DIAGNOSIS — I120 Hypertensive chronic kidney disease with stage 5 chronic kidney disease or end stage renal disease: Secondary | ICD-10-CM | POA: Diagnosis not present

## 2020-11-21 DIAGNOSIS — J45909 Unspecified asthma, uncomplicated: Secondary | ICD-10-CM | POA: Diagnosis not present

## 2020-11-21 DIAGNOSIS — M329 Systemic lupus erythematosus, unspecified: Secondary | ICD-10-CM | POA: Diagnosis not present

## 2020-11-21 DIAGNOSIS — I700 Atherosclerosis of aorta: Secondary | ICD-10-CM | POA: Diagnosis not present

## 2020-11-21 DIAGNOSIS — N186 End stage renal disease: Secondary | ICD-10-CM | POA: Diagnosis not present

## 2020-11-21 DIAGNOSIS — M545 Low back pain, unspecified: Secondary | ICD-10-CM | POA: Diagnosis not present

## 2020-11-21 DIAGNOSIS — R1031 Right lower quadrant pain: Secondary | ICD-10-CM | POA: Diagnosis not present

## 2020-11-24 DIAGNOSIS — T8189XA Other complications of procedures, not elsewhere classified, initial encounter: Secondary | ICD-10-CM | POA: Diagnosis not present

## 2020-11-25 DIAGNOSIS — Z940 Kidney transplant status: Secondary | ICD-10-CM | POA: Diagnosis not present

## 2020-11-25 DIAGNOSIS — Z5181 Encounter for therapeutic drug level monitoring: Secondary | ICD-10-CM | POA: Diagnosis not present

## 2020-11-25 DIAGNOSIS — E119 Type 2 diabetes mellitus without complications: Secondary | ICD-10-CM | POA: Diagnosis not present

## 2020-11-25 DIAGNOSIS — Z79899 Other long term (current) drug therapy: Secondary | ICD-10-CM | POA: Diagnosis not present

## 2020-11-25 DIAGNOSIS — R0602 Shortness of breath: Secondary | ICD-10-CM | POA: Diagnosis not present

## 2020-11-25 DIAGNOSIS — Z48298 Encounter for aftercare following other organ transplant: Secondary | ICD-10-CM | POA: Diagnosis not present

## 2020-11-27 DIAGNOSIS — Z20822 Contact with and (suspected) exposure to covid-19: Secondary | ICD-10-CM | POA: Diagnosis not present

## 2020-11-27 DIAGNOSIS — M329 Systemic lupus erythematosus, unspecified: Secondary | ICD-10-CM | POA: Diagnosis not present

## 2020-11-27 DIAGNOSIS — E1122 Type 2 diabetes mellitus with diabetic chronic kidney disease: Secondary | ICD-10-CM | POA: Diagnosis not present

## 2020-11-27 DIAGNOSIS — N186 End stage renal disease: Secondary | ICD-10-CM | POA: Diagnosis not present

## 2020-11-27 DIAGNOSIS — D849 Immunodeficiency, unspecified: Secondary | ICD-10-CM | POA: Diagnosis not present

## 2020-11-27 DIAGNOSIS — D631 Anemia in chronic kidney disease: Secondary | ICD-10-CM | POA: Diagnosis not present

## 2020-11-27 DIAGNOSIS — I120 Hypertensive chronic kidney disease with stage 5 chronic kidney disease or end stage renal disease: Secondary | ICD-10-CM | POA: Diagnosis not present

## 2020-11-27 DIAGNOSIS — R0789 Other chest pain: Secondary | ICD-10-CM | POA: Diagnosis not present

## 2020-11-27 DIAGNOSIS — R0600 Dyspnea, unspecified: Secondary | ICD-10-CM | POA: Diagnosis not present

## 2020-11-27 DIAGNOSIS — R0602 Shortness of breath: Secondary | ICD-10-CM | POA: Diagnosis not present

## 2020-11-27 DIAGNOSIS — J45909 Unspecified asthma, uncomplicated: Secondary | ICD-10-CM | POA: Diagnosis not present

## 2020-11-27 DIAGNOSIS — R079 Chest pain, unspecified: Secondary | ICD-10-CM | POA: Diagnosis not present

## 2020-11-28 DIAGNOSIS — E119 Type 2 diabetes mellitus without complications: Secondary | ICD-10-CM | POA: Diagnosis not present

## 2020-11-28 DIAGNOSIS — I2510 Atherosclerotic heart disease of native coronary artery without angina pectoris: Secondary | ICD-10-CM | POA: Diagnosis not present

## 2020-11-28 DIAGNOSIS — R0602 Shortness of breath: Secondary | ICD-10-CM | POA: Diagnosis not present

## 2020-11-28 DIAGNOSIS — I340 Nonrheumatic mitral (valve) insufficiency: Secondary | ICD-10-CM | POA: Diagnosis not present

## 2020-11-28 DIAGNOSIS — I10 Essential (primary) hypertension: Secondary | ICD-10-CM | POA: Diagnosis not present

## 2020-11-29 DIAGNOSIS — Z0000 Encounter for general adult medical examination without abnormal findings: Secondary | ICD-10-CM | POA: Diagnosis not present

## 2020-12-02 DIAGNOSIS — I10 Essential (primary) hypertension: Secondary | ICD-10-CM | POA: Diagnosis not present

## 2020-12-02 DIAGNOSIS — E119 Type 2 diabetes mellitus without complications: Secondary | ICD-10-CM | POA: Diagnosis not present

## 2020-12-02 DIAGNOSIS — Z79899 Other long term (current) drug therapy: Secondary | ICD-10-CM | POA: Diagnosis not present

## 2020-12-02 DIAGNOSIS — D84821 Immunodeficiency due to drugs: Secondary | ICD-10-CM | POA: Diagnosis not present

## 2020-12-02 DIAGNOSIS — E785 Hyperlipidemia, unspecified: Secondary | ICD-10-CM | POA: Diagnosis not present

## 2020-12-02 DIAGNOSIS — Z940 Kidney transplant status: Secondary | ICD-10-CM | POA: Diagnosis not present

## 2020-12-02 DIAGNOSIS — Z09 Encounter for follow-up examination after completed treatment for conditions other than malignant neoplasm: Secondary | ICD-10-CM | POA: Diagnosis not present

## 2020-12-08 DIAGNOSIS — L7622 Postprocedural hemorrhage and hematoma of skin and subcutaneous tissue following other procedure: Secondary | ICD-10-CM | POA: Diagnosis not present

## 2020-12-08 DIAGNOSIS — E785 Hyperlipidemia, unspecified: Secondary | ICD-10-CM | POA: Diagnosis not present

## 2020-12-08 DIAGNOSIS — Z4822 Encounter for aftercare following kidney transplant: Secondary | ICD-10-CM | POA: Diagnosis not present

## 2020-12-08 DIAGNOSIS — E1142 Type 2 diabetes mellitus with diabetic polyneuropathy: Secondary | ICD-10-CM | POA: Diagnosis not present

## 2020-12-08 DIAGNOSIS — N186 End stage renal disease: Secondary | ICD-10-CM | POA: Diagnosis not present

## 2020-12-08 DIAGNOSIS — M3214 Glomerular disease in systemic lupus erythematosus: Secondary | ICD-10-CM | POA: Diagnosis not present

## 2020-12-08 DIAGNOSIS — Z4801 Encounter for change or removal of surgical wound dressing: Secondary | ICD-10-CM | POA: Diagnosis not present

## 2020-12-08 DIAGNOSIS — I2510 Atherosclerotic heart disease of native coronary artery without angina pectoris: Secondary | ICD-10-CM | POA: Diagnosis not present

## 2020-12-10 DIAGNOSIS — Z940 Kidney transplant status: Secondary | ICD-10-CM | POA: Diagnosis not present

## 2020-12-10 DIAGNOSIS — D84821 Immunodeficiency due to drugs: Secondary | ICD-10-CM | POA: Diagnosis not present

## 2020-12-10 DIAGNOSIS — I10 Essential (primary) hypertension: Secondary | ICD-10-CM | POA: Diagnosis not present

## 2020-12-10 DIAGNOSIS — E119 Type 2 diabetes mellitus without complications: Secondary | ICD-10-CM | POA: Diagnosis not present

## 2020-12-10 DIAGNOSIS — E785 Hyperlipidemia, unspecified: Secondary | ICD-10-CM | POA: Diagnosis not present

## 2020-12-10 DIAGNOSIS — Z79899 Other long term (current) drug therapy: Secondary | ICD-10-CM | POA: Diagnosis not present

## 2020-12-15 DIAGNOSIS — Z4822 Encounter for aftercare following kidney transplant: Secondary | ICD-10-CM | POA: Diagnosis not present

## 2020-12-15 DIAGNOSIS — E785 Hyperlipidemia, unspecified: Secondary | ICD-10-CM | POA: Diagnosis not present

## 2020-12-15 DIAGNOSIS — Z4801 Encounter for change or removal of surgical wound dressing: Secondary | ICD-10-CM | POA: Diagnosis not present

## 2020-12-15 DIAGNOSIS — E1142 Type 2 diabetes mellitus with diabetic polyneuropathy: Secondary | ICD-10-CM | POA: Diagnosis not present

## 2020-12-15 DIAGNOSIS — I2510 Atherosclerotic heart disease of native coronary artery without angina pectoris: Secondary | ICD-10-CM | POA: Diagnosis not present

## 2020-12-15 DIAGNOSIS — N186 End stage renal disease: Secondary | ICD-10-CM | POA: Diagnosis not present

## 2020-12-15 DIAGNOSIS — M3214 Glomerular disease in systemic lupus erythematosus: Secondary | ICD-10-CM | POA: Diagnosis not present

## 2020-12-15 DIAGNOSIS — L7622 Postprocedural hemorrhage and hematoma of skin and subcutaneous tissue following other procedure: Secondary | ICD-10-CM | POA: Diagnosis not present

## 2020-12-17 DIAGNOSIS — E1142 Type 2 diabetes mellitus with diabetic polyneuropathy: Secondary | ICD-10-CM | POA: Diagnosis not present

## 2020-12-17 DIAGNOSIS — Z4801 Encounter for change or removal of surgical wound dressing: Secondary | ICD-10-CM | POA: Diagnosis not present

## 2020-12-17 DIAGNOSIS — E785 Hyperlipidemia, unspecified: Secondary | ICD-10-CM | POA: Diagnosis not present

## 2020-12-17 DIAGNOSIS — Z4822 Encounter for aftercare following kidney transplant: Secondary | ICD-10-CM | POA: Diagnosis not present

## 2020-12-17 DIAGNOSIS — M3214 Glomerular disease in systemic lupus erythematosus: Secondary | ICD-10-CM | POA: Diagnosis not present

## 2020-12-17 DIAGNOSIS — L7622 Postprocedural hemorrhage and hematoma of skin and subcutaneous tissue following other procedure: Secondary | ICD-10-CM | POA: Diagnosis not present

## 2020-12-17 DIAGNOSIS — N186 End stage renal disease: Secondary | ICD-10-CM | POA: Diagnosis not present

## 2020-12-17 DIAGNOSIS — I2510 Atherosclerotic heart disease of native coronary artery without angina pectoris: Secondary | ICD-10-CM | POA: Diagnosis not present

## 2020-12-20 DIAGNOSIS — R5383 Other fatigue: Secondary | ICD-10-CM | POA: Diagnosis not present

## 2020-12-20 DIAGNOSIS — D689 Coagulation defect, unspecified: Secondary | ICD-10-CM | POA: Diagnosis not present

## 2020-12-21 DIAGNOSIS — I2510 Atherosclerotic heart disease of native coronary artery without angina pectoris: Secondary | ICD-10-CM | POA: Diagnosis not present

## 2020-12-21 DIAGNOSIS — E119 Type 2 diabetes mellitus without complications: Secondary | ICD-10-CM | POA: Diagnosis not present

## 2020-12-21 DIAGNOSIS — M329 Systemic lupus erythematosus, unspecified: Secondary | ICD-10-CM | POA: Diagnosis not present

## 2020-12-21 DIAGNOSIS — E785 Hyperlipidemia, unspecified: Secondary | ICD-10-CM | POA: Diagnosis not present

## 2020-12-21 DIAGNOSIS — I491 Atrial premature depolarization: Secondary | ICD-10-CM | POA: Diagnosis not present

## 2020-12-21 DIAGNOSIS — Z940 Kidney transplant status: Secondary | ICD-10-CM | POA: Diagnosis not present

## 2020-12-21 DIAGNOSIS — I10 Essential (primary) hypertension: Secondary | ICD-10-CM | POA: Diagnosis not present

## 2020-12-23 DIAGNOSIS — L930 Discoid lupus erythematosus: Secondary | ICD-10-CM | POA: Diagnosis not present

## 2020-12-23 DIAGNOSIS — E790 Hyperuricemia without signs of inflammatory arthritis and tophaceous disease: Secondary | ICD-10-CM | POA: Diagnosis not present

## 2020-12-23 DIAGNOSIS — Z79899 Other long term (current) drug therapy: Secondary | ICD-10-CM | POA: Diagnosis not present

## 2020-12-23 DIAGNOSIS — E119 Type 2 diabetes mellitus without complications: Secondary | ICD-10-CM | POA: Diagnosis not present

## 2020-12-23 DIAGNOSIS — Z940 Kidney transplant status: Secondary | ICD-10-CM | POA: Diagnosis not present

## 2020-12-23 DIAGNOSIS — N189 Chronic kidney disease, unspecified: Secondary | ICD-10-CM | POA: Diagnosis not present

## 2020-12-23 DIAGNOSIS — E785 Hyperlipidemia, unspecified: Secondary | ICD-10-CM | POA: Diagnosis not present

## 2020-12-23 DIAGNOSIS — Z09 Encounter for follow-up examination after completed treatment for conditions other than malignant neoplasm: Secondary | ICD-10-CM | POA: Diagnosis not present

## 2020-12-23 DIAGNOSIS — D84821 Immunodeficiency due to drugs: Secondary | ICD-10-CM | POA: Diagnosis not present

## 2020-12-23 DIAGNOSIS — I10 Essential (primary) hypertension: Secondary | ICD-10-CM | POA: Diagnosis not present

## 2020-12-25 DIAGNOSIS — R002 Palpitations: Secondary | ICD-10-CM | POA: Diagnosis not present

## 2020-12-28 DIAGNOSIS — I2510 Atherosclerotic heart disease of native coronary artery without angina pectoris: Secondary | ICD-10-CM | POA: Diagnosis not present

## 2020-12-28 DIAGNOSIS — I208 Other forms of angina pectoris: Secondary | ICD-10-CM | POA: Diagnosis not present

## 2020-12-28 DIAGNOSIS — Z9861 Coronary angioplasty status: Secondary | ICD-10-CM | POA: Diagnosis not present

## 2020-12-29 DIAGNOSIS — I25119 Atherosclerotic heart disease of native coronary artery with unspecified angina pectoris: Secondary | ICD-10-CM | POA: Diagnosis not present

## 2020-12-29 DIAGNOSIS — E1142 Type 2 diabetes mellitus with diabetic polyneuropathy: Secondary | ICD-10-CM | POA: Diagnosis not present

## 2020-12-29 DIAGNOSIS — Z794 Long term (current) use of insulin: Secondary | ICD-10-CM | POA: Diagnosis not present

## 2020-12-29 DIAGNOSIS — K219 Gastro-esophageal reflux disease without esophagitis: Secondary | ICD-10-CM | POA: Diagnosis not present

## 2020-12-29 DIAGNOSIS — I10 Essential (primary) hypertension: Secondary | ICD-10-CM | POA: Diagnosis not present

## 2020-12-29 DIAGNOSIS — E785 Hyperlipidemia, unspecified: Secondary | ICD-10-CM | POA: Diagnosis not present

## 2020-12-29 DIAGNOSIS — G8220 Paraplegia, unspecified: Secondary | ICD-10-CM | POA: Diagnosis not present

## 2020-12-29 DIAGNOSIS — E1122 Type 2 diabetes mellitus with diabetic chronic kidney disease: Secondary | ICD-10-CM | POA: Diagnosis not present

## 2020-12-29 DIAGNOSIS — I700 Atherosclerosis of aorta: Secondary | ICD-10-CM | POA: Diagnosis not present

## 2020-12-29 DIAGNOSIS — M3219 Other organ or system involvement in systemic lupus erythematosus: Secondary | ICD-10-CM | POA: Diagnosis not present

## 2020-12-29 DIAGNOSIS — E1165 Type 2 diabetes mellitus with hyperglycemia: Secondary | ICD-10-CM | POA: Diagnosis not present

## 2020-12-29 DIAGNOSIS — N2581 Secondary hyperparathyroidism of renal origin: Secondary | ICD-10-CM | POA: Diagnosis not present

## 2021-01-06 DIAGNOSIS — M329 Systemic lupus erythematosus, unspecified: Secondary | ICD-10-CM | POA: Diagnosis not present

## 2021-01-06 DIAGNOSIS — Z940 Kidney transplant status: Secondary | ICD-10-CM | POA: Diagnosis not present

## 2021-01-06 DIAGNOSIS — D84821 Immunodeficiency due to drugs: Secondary | ICD-10-CM | POA: Diagnosis not present

## 2021-01-06 DIAGNOSIS — E119 Type 2 diabetes mellitus without complications: Secondary | ICD-10-CM | POA: Diagnosis not present

## 2021-01-06 DIAGNOSIS — B1910 Unspecified viral hepatitis B without hepatic coma: Secondary | ICD-10-CM | POA: Diagnosis not present

## 2021-01-06 DIAGNOSIS — Z09 Encounter for follow-up examination after completed treatment for conditions other than malignant neoplasm: Secondary | ICD-10-CM | POA: Diagnosis not present

## 2021-01-06 DIAGNOSIS — R69 Illness, unspecified: Secondary | ICD-10-CM | POA: Diagnosis not present

## 2021-01-06 DIAGNOSIS — Z79899 Other long term (current) drug therapy: Secondary | ICD-10-CM | POA: Diagnosis not present

## 2021-01-06 DIAGNOSIS — Z0189 Encounter for other specified special examinations: Secondary | ICD-10-CM | POA: Diagnosis not present

## 2021-01-06 DIAGNOSIS — E785 Hyperlipidemia, unspecified: Secondary | ICD-10-CM | POA: Diagnosis not present

## 2021-01-06 DIAGNOSIS — I10 Essential (primary) hypertension: Secondary | ICD-10-CM | POA: Diagnosis not present

## 2021-01-06 DIAGNOSIS — B182 Chronic viral hepatitis C: Secondary | ICD-10-CM | POA: Diagnosis not present

## 2021-01-12 DIAGNOSIS — Z940 Kidney transplant status: Secondary | ICD-10-CM | POA: Diagnosis not present

## 2021-01-12 DIAGNOSIS — Z79899 Other long term (current) drug therapy: Secondary | ICD-10-CM | POA: Diagnosis not present

## 2021-01-18 DIAGNOSIS — I10 Essential (primary) hypertension: Secondary | ICD-10-CM | POA: Diagnosis not present

## 2021-01-18 DIAGNOSIS — Z79899 Other long term (current) drug therapy: Secondary | ICD-10-CM | POA: Diagnosis not present

## 2021-01-18 DIAGNOSIS — E785 Hyperlipidemia, unspecified: Secondary | ICD-10-CM | POA: Diagnosis not present

## 2021-01-18 DIAGNOSIS — Z940 Kidney transplant status: Secondary | ICD-10-CM | POA: Diagnosis not present

## 2021-01-18 DIAGNOSIS — D84821 Immunodeficiency due to drugs: Secondary | ICD-10-CM | POA: Diagnosis not present

## 2021-01-18 DIAGNOSIS — E119 Type 2 diabetes mellitus without complications: Secondary | ICD-10-CM | POA: Diagnosis not present

## 2021-01-19 DIAGNOSIS — Z6823 Body mass index (BMI) 23.0-23.9, adult: Secondary | ICD-10-CM | POA: Diagnosis not present

## 2021-01-19 DIAGNOSIS — E119 Type 2 diabetes mellitus without complications: Secondary | ICD-10-CM | POA: Diagnosis not present

## 2021-01-19 DIAGNOSIS — R051 Acute cough: Secondary | ICD-10-CM | POA: Diagnosis not present

## 2021-02-04 DIAGNOSIS — E119 Type 2 diabetes mellitus without complications: Secondary | ICD-10-CM | POA: Diagnosis not present

## 2021-02-04 DIAGNOSIS — Z79899 Other long term (current) drug therapy: Secondary | ICD-10-CM | POA: Diagnosis not present

## 2021-02-04 DIAGNOSIS — E785 Hyperlipidemia, unspecified: Secondary | ICD-10-CM | POA: Diagnosis not present

## 2021-02-04 DIAGNOSIS — D84821 Immunodeficiency due to drugs: Secondary | ICD-10-CM | POA: Diagnosis not present

## 2021-02-04 DIAGNOSIS — I2510 Atherosclerotic heart disease of native coronary artery without angina pectoris: Secondary | ICD-10-CM | POA: Diagnosis not present

## 2021-02-04 DIAGNOSIS — I10 Essential (primary) hypertension: Secondary | ICD-10-CM | POA: Diagnosis not present

## 2021-02-04 DIAGNOSIS — Z940 Kidney transplant status: Secondary | ICD-10-CM | POA: Diagnosis not present

## 2021-02-08 DIAGNOSIS — M1990 Unspecified osteoarthritis, unspecified site: Secondary | ICD-10-CM | POA: Diagnosis not present

## 2021-02-08 DIAGNOSIS — E663 Overweight: Secondary | ICD-10-CM | POA: Diagnosis not present

## 2021-02-08 DIAGNOSIS — E782 Mixed hyperlipidemia: Secondary | ICD-10-CM | POA: Diagnosis not present

## 2021-02-08 DIAGNOSIS — Z794 Long term (current) use of insulin: Secondary | ICD-10-CM | POA: Diagnosis not present

## 2021-02-08 DIAGNOSIS — G629 Polyneuropathy, unspecified: Secondary | ICD-10-CM | POA: Diagnosis not present

## 2021-02-08 DIAGNOSIS — Z992 Dependence on renal dialysis: Secondary | ICD-10-CM | POA: Diagnosis not present

## 2021-02-08 DIAGNOSIS — Z0001 Encounter for general adult medical examination with abnormal findings: Secondary | ICD-10-CM | POA: Diagnosis not present

## 2021-02-08 DIAGNOSIS — I10 Essential (primary) hypertension: Secondary | ICD-10-CM | POA: Diagnosis not present

## 2021-02-08 DIAGNOSIS — N186 End stage renal disease: Secondary | ICD-10-CM | POA: Diagnosis not present

## 2021-02-08 DIAGNOSIS — E1122 Type 2 diabetes mellitus with diabetic chronic kidney disease: Secondary | ICD-10-CM | POA: Diagnosis not present

## 2021-03-02 DIAGNOSIS — I129 Hypertensive chronic kidney disease with stage 1 through stage 4 chronic kidney disease, or unspecified chronic kidney disease: Secondary | ICD-10-CM | POA: Diagnosis not present

## 2021-03-02 DIAGNOSIS — M3214 Glomerular disease in systemic lupus erythematosus: Secondary | ICD-10-CM | POA: Diagnosis not present

## 2021-03-02 DIAGNOSIS — Z940 Kidney transplant status: Secondary | ICD-10-CM | POA: Diagnosis not present

## 2021-03-02 DIAGNOSIS — I2510 Atherosclerotic heart disease of native coronary artery without angina pectoris: Secondary | ICD-10-CM | POA: Diagnosis not present

## 2021-03-02 DIAGNOSIS — T82591A Other mechanical complication of surgically created arteriovenous shunt, initial encounter: Secondary | ICD-10-CM | POA: Diagnosis not present

## 2021-03-02 DIAGNOSIS — Z79899 Other long term (current) drug therapy: Secondary | ICD-10-CM | POA: Diagnosis not present

## 2021-03-02 DIAGNOSIS — E1122 Type 2 diabetes mellitus with diabetic chronic kidney disease: Secondary | ICD-10-CM | POA: Diagnosis not present

## 2021-03-11 DIAGNOSIS — Z992 Dependence on renal dialysis: Secondary | ICD-10-CM | POA: Diagnosis not present

## 2021-03-11 DIAGNOSIS — J0100 Acute maxillary sinusitis, unspecified: Secondary | ICD-10-CM | POA: Diagnosis not present

## 2021-03-11 DIAGNOSIS — Z0000 Encounter for general adult medical examination without abnormal findings: Secondary | ICD-10-CM | POA: Diagnosis not present

## 2021-03-11 DIAGNOSIS — Z794 Long term (current) use of insulin: Secondary | ICD-10-CM | POA: Diagnosis not present

## 2021-03-11 DIAGNOSIS — E1122 Type 2 diabetes mellitus with diabetic chronic kidney disease: Secondary | ICD-10-CM | POA: Diagnosis not present

## 2021-03-11 DIAGNOSIS — I10 Essential (primary) hypertension: Secondary | ICD-10-CM | POA: Diagnosis not present

## 2021-03-11 DIAGNOSIS — E663 Overweight: Secondary | ICD-10-CM | POA: Diagnosis not present

## 2021-03-11 DIAGNOSIS — G629 Polyneuropathy, unspecified: Secondary | ICD-10-CM | POA: Diagnosis not present

## 2021-03-11 DIAGNOSIS — M1990 Unspecified osteoarthritis, unspecified site: Secondary | ICD-10-CM | POA: Diagnosis not present

## 2021-03-11 DIAGNOSIS — N186 End stage renal disease: Secondary | ICD-10-CM | POA: Diagnosis not present

## 2021-03-18 ENCOUNTER — Other Ambulatory Visit: Payer: Self-pay

## 2021-03-18 ENCOUNTER — Encounter: Payer: Self-pay | Admitting: Surgery

## 2021-03-18 ENCOUNTER — Ambulatory Visit (INDEPENDENT_AMBULATORY_CARE_PROVIDER_SITE_OTHER): Payer: Medicare HMO | Admitting: Surgery

## 2021-03-18 VITALS — BP 106/60 | HR 80 | Temp 98.2°F | Ht 62.0 in | Wt 133.0 lb

## 2021-03-18 DIAGNOSIS — Z992 Dependence on renal dialysis: Secondary | ICD-10-CM

## 2021-03-18 DIAGNOSIS — N186 End stage renal disease: Secondary | ICD-10-CM | POA: Diagnosis not present

## 2021-03-18 NOTE — Progress Notes (Signed)
[facility] [place]  Patient name: Paula Bass MRN: [id number] DOB: May 13, 1970 Sex: female   REQUESTING PROVIDER:    Dr. Joelyn Oms   REASON FOR CONSULT:    Knots on fistula  HISTORY OF PRESENT ILLNESS:   Paula Bass is a 51 y.o. female, who is referred today for evaluation of her left upper arm graft.  This was placed in [place].  She states that she gets some numbness in her upper arm and is worried about some lumps on her graft.  She denies any pain in her hand.  She states that the knots are not getting any bigger.  The patient is status post transplant several months ago and is no longer on dialysis.  The patient is obese with type 2 diabetes.  He is on a statin for hypercholesterolemia.  He is medically managed for hypertension.  He also has a history of coronary artery disease, status post PCI  PAST MEDICAL HISTORY    Past Medical History:  Diagnosis Date   Acute diverticulitis 12/01/2017   Anemia    CAD in native artery    a. PCI to [place] in 2017 in [place]. b.  In 03/2018 she had a repeat cath with 80-90% mid/distal LCx s/p synergy DES, with residual RCA stenosis mg'd medically. c.01/2020 s/p orbital atherectomy/DES to the OM1 and OM2, residual RCA stenosis.   Diabetes mellitus without complication ([facility])    Dialysis patient ([facility])    Diverticulitis    ESRD (end stage renal disease) ([facility])    GI bleeding    Hypertension    LGI bleed 12/16/2017   Lower GI bleed    Lupus ([facility])    Renal disorder    dialysis   UTI (urinary tract infection) 12/01/2017     FAMILY HISTORY   Family History  Problem Relation Age of Onset   Hypertension Mother    Other Father        unkown health history   Healthy Brother    Healthy Brother    Colon cancer Neg Hx    Esophageal cancer Neg Hx    Breast cancer Neg Hx     SOCIAL HISTORY:   Social History   Socioeconomic History   Marital status: Single     Spouse name: Not on file   Number of children: 1   Years of education: Not on file   Highest education level: Not on file  Occupational History   Occupation: disabled  Tobacco Use   Smoking status: Never   Smokeless tobacco: Never  Vaping Use   Vaping Use: Never used  Substance and Sexual Activity   Alcohol use: Never   Drug use: Never   Sexual activity: Not Currently    Partners: Male    Birth control/protection: Condom  Other Topics Concern   Not on file  Social History Narrative   Not on file   Social Determinants of Health   Financial Resource Strain: Not on file  Food Insecurity: Not on file  Transportation Needs: Not on file  Physical Activity: Not on file  Stress: Not on file  Social Connections: Not on file  Intimate Partner Violence: Not on file    ALLERGIES:    Allergies  Allergen Reactions   Penicillins Hives    DID THE REACTION INVOLVE: Swelling of the face/tongue/throat, SOB, or low BP? Yes Sudden or severe rash/hives, skin peeling, or the inside of the mouth or nose? Yes Did it require medical  treatment? No When did it last happen? Within the past 10 years If all above answers are "NO", may proceed with cephalosporin use. Ceftriaxone/merrem ok    CURRENT MEDICATIONS:    Current Outpatient Medications  Medication Sig Dispense Refill   acetaminophen (TYLENOL) 325 MG tablet Take 2 tablets (650 mg total) by mouth every 4 (four) hours as needed for fever, headache or mild pain.     aspirin EC 81 MG tablet Take 1 tablet (81 mg total) by mouth daily.     atorvastatin (LIPITOR) 40 MG tablet Take 1 tablet (40 mg total) by mouth daily. 90 tablet 3   AURYXIA 1 GM 210 MG(Fe) tablet Take 210-420 tablets by mouth See admin instructions. Take 420 mg by mouth three times a day with meals and 210 mg twice a day with a snack     clopidogrel (PLAVIX) 75 MG tablet Take 1 tablet (75 mg total) by mouth daily.     insulin NPH-regular Human (70-30) 100 UNIT/ML injection  Inject 30-40 Units into the skin See admin instructions. Inject 30 units in the morning and 30 units at bedtime. (Patient taking differently: Inject 40 Units into the skin daily with breakfast. Inject 40 units in the morning and 5 units at bedtime.) 10 mL 1   lidocaine-prilocaine (EMLA) cream Apply 1 application topically Every Tuesday,Thursday,and Saturday with dialysis.      metoprolol tartrate (LOPRESSOR) 25 MG tablet Take 1 tablet (25 mg total) by mouth 2 (two) times daily. 180 tablet 2   multivitamin (RENA-VIT) TABS tablet Take 1 tablet by mouth daily.     nitroGLYCERIN (NITROSTAT) 0.4 MG SL tablet Place 1 tablet (0.4 mg total) under the tongue every 5 (five) minutes as needed. 25 tablet 2   Polyethyl Glycol-Propyl Glycol 0.4-0.3 % SOLN Place 1 drop into both eyes 3 (three) times daily as needed (dry/irritated eyes.).     predniSONE (DELTASONE) 5 MG tablet Take 5 mg by mouth daily with breakfast.     amLODipine (NORVASC) 10 MG tablet Take 10 mg by mouth daily. (Patient not taking: Reported on 03/18/2021)     B Complex-C-Zn-Folic Acid (DIALYVITE/ZINC) TABS Take 1 tablet by mouth daily. (Patient not taking: Reported on 07/21/2020)     isosorbide mononitrate (IMDUR) 60 MG 24 hr tablet Take 1 tablet (60 mg total) by mouth daily. (Patient not taking: Reported on 03/18/2021) 90 tablet 3   metroNIDAZOLE (FLAGYL) 500 MG tablet Take 1 tablet (500 mg total) by mouth 2 (two) times daily. (Patient not taking: Reported on 03/18/2021) 14 tablet 2   No current facility-administered medications for this visit.    REVIEW OF SYSTEMS:   [X]  denotes positive finding, [ ]  denotes negative finding Cardiac  Comments:  Chest pain or chest pressure:    Shortness of breath upon exertion:    Short of breath when lying flat:    Irregular heart rhythm:        Vascular    Pain in calf, thigh, or hip brought on by ambulation:    Pain in feet at night that wakes you up from your sleep:     Blood clot in your veins:     Leg swelling:         Pulmonary    Oxygen at home:    Productive cough:     Wheezing:         Neurologic    Sudden weakness in arms or legs:     Sudden numbness in arms or legs:  Sudden onset of difficulty speaking or slurred speech:    Temporary loss of vision in one eye:     Problems with dizziness:         Gastrointestinal    Blood in stool:      Vomited blood:         Genitourinary    Burning when urinating:     Blood in urine:        Psychiatric    Major depression:         Hematologic    Bleeding problems:    Problems with blood clotting too easily:        Skin    Rashes or ulcers:        Constitutional    Fever or chills:     PHYSICAL EXAM:   Vitals:   03/18/21 1100  BP: 106/60  Pulse: 80  Temp: 98.2 F (36.8 C)  TempSrc: Temporal  SpO2: 100%  Weight: 133 lb (60.3 kg)  Height: 5\' 2"  (1.575 m)    GENERAL: The patient is a well-nourished female, in no acute distress. The vital signs are documented above. CARDIAC: There is a regular rate and rhythm.  VASCULAR: Palpable thrill within the left upper arm graft with multiple small aneurysms without skin thinning or ulcers. PULMONARY: Nonlabored respirations MUSCULOSKELETAL: There are no major deformities or cyanosis. NEUROLOGIC: No focal weakness or paresthesias are detected. SKIN: There are no ulcers or rashes noted. PSYCHIATRIC: The patient has a normal affect.  STUDIES:   None  ASSESSMENT and PLAN   I discussed with the patient that I do not see anything concerning about the small aneurysms in her graft.  I told her that I would be happy to remove the graft but I do not think that would be the most appropriate thing to do at this time.  I told her that that probably would not cure the numbness in her arm and potentially could make it worse.  I suspect the graft will thrombose over time and things will become less of an issue.  There is no concern for aneurysm rupture at this time.  She is okay  with leaving this alone, she just wanted to make sure and have reassurance that there was nothing wrong.   Leia Alf, MD, FACS [facility] [phone] Pager [phone]

## 2021-03-22 ENCOUNTER — Other Ambulatory Visit: Payer: Self-pay

## 2021-03-22 ENCOUNTER — Ambulatory Visit (INDEPENDENT_AMBULATORY_CARE_PROVIDER_SITE_OTHER): Payer: Medicare HMO

## 2021-03-22 ENCOUNTER — Ambulatory Visit (INDEPENDENT_AMBULATORY_CARE_PROVIDER_SITE_OTHER): Payer: Medicare HMO | Admitting: Cardiovascular Disease

## 2021-03-22 ENCOUNTER — Encounter: Payer: Self-pay | Admitting: Cardiovascular Disease

## 2021-03-22 VITALS — BP 104/60 | HR 81 | Ht 62.0 in | Wt 133.8 lb

## 2021-03-22 DIAGNOSIS — E785 Hyperlipidemia, unspecified: Secondary | ICD-10-CM

## 2021-03-22 DIAGNOSIS — I25119 Atherosclerotic heart disease of native coronary artery with unspecified angina pectoris: Secondary | ICD-10-CM

## 2021-03-22 DIAGNOSIS — I10 Essential (primary) hypertension: Secondary | ICD-10-CM

## 2021-03-22 DIAGNOSIS — R002 Palpitations: Secondary | ICD-10-CM | POA: Diagnosis not present

## 2021-03-22 LAB — BASIC METABOLIC PANEL
BUN/Creatinine Ratio: 16 (ref 9–23)
BUN: 19 mg/dL (ref 6–24)
CO2: 20 mmol/L (ref 20–29)
Calcium: 9.9 mg/dL (ref 8.7–10.2)
Chloride: 102 mmol/L (ref 96–106)
Creatinine, Ser: 1.22 mg/dL — ABNORMAL HIGH (ref 0.57–1.00)
Glucose: 251 mg/dL — ABNORMAL HIGH (ref 70–99)
Potassium: 4.1 mmol/L (ref 3.5–5.2)
Sodium: 137 mmol/L (ref 134–144)
eGFR: 54 mL/min/{1.73_m2} — ABNORMAL LOW (ref >59)

## 2021-03-22 LAB — TSH: TSH: 0.773 u[IU]/mL (ref 0.450–4.500)

## 2021-03-22 MED ORDER — PROPRANOLOL HCL 10 MG PO TABS: 10.0000 mg | ORAL_TABLET | Freq: Four times a day (QID) | ORAL | 3 refills | Status: DC | PRN

## 2021-03-22 NOTE — Patient Instructions (Signed)
Medication Instructions:  Your physician has recommended you make the following change in your medication:   STOP Amlodipine  STOP Isosorbide  START Propranolol 10 mg taking 1 tablet 4X's daily as needed for Palpitations   *If you need a refill on your cardiac medications before your next appointment, please call your pharmacy*   Lab Work: TODAY:  TSH & BMP  If you have labs (blood work) drawn today and your tests are completely normal, you will receive your results only by: [place] (if you have MyChart) OR A paper copy in the mail If you have any lab test that is abnormal or we need to change your treatment, we will call you to review the results.   Testing/Procedures: ZIO AT Long term monitor-Live Telemetry  Your physician has requested you wear a ZIO patch monitor for 14 days.  This is a single patch monitor. [employer] supplies one patch monitor per enrollment. Additional  stickers are not available.  Please do not apply patch if you will be having a Nuclear Stress Test, Echocardiogram, Cardiac CT, MRI,  or Chest Xray during the period you would be wearing the monitor. The patch cannot be worn during  these tests. You cannot remove and re-apply the ZIO AT patch monitor.  Your ZIO patch monitor will be mailed 3 day USPS to your address on file. It may take 3-5 days to  receive your monitor after you have been enrolled.  Once you have received your monitor, please review the enclosed instructions. Your monitor has  already been registered assigning a specific monitor serial # to you.   Billing and Patient Assistance Program information  Paula Bass has been supplied with any insurance information on record for billing. [employer] offers a sliding scale Patient Assistance Program for patients without insurance, or whose  insurance does not completely cover the cost of the ZIO patch monitor. You must apply for the  Patient Assistance Program to qualify for the discounted rate. To  apply, call [employer] at [phone],  select option 4, select option 2 , ask to apply for the Patient Assistance Program, (you can request an  interpreter if needed). [employer] will ask your household income and how many people are in your  household. [employer] will quote your out-of-pocket cost based on this information. They will also be able  to set up a 12 month interest free payment plan if needed.  Applying the monitor   Shave hair from upper left chest.  Hold the abrader disc by orange tab. Rub the abrader in 40 strokes over left upper chest as indicated in  your monitor instructions.  Clean area with 4 enclosed alcohol pads. Use all pads to ensure the area is cleaned thoroughly. Let  dry.  Apply patch as indicated in monitor instructions. Patch will be placed under collarbone on left side of  chest with arrow pointing upward.  Rub patch adhesive wings for 2 minutes. Remove the white label marked "1". Remove the white label  marked "2". Rub patch adhesive wings for 2 additional minutes.  While looking in a mirror, press and release button in center of patch. A small green light will flash 3-4  times. This will be your only indicator that the monitor has been turned on.  Do not shower for the first 24 hours. You may shower after the first 24 hours.  Press the button if you feel a symptom. You will hear a small click. Record Date, Time and Symptom in  the Patient  Log.   Starting the Gateway  In your kit there is a small plastic box the size of a cellphone. This is Airline pilot. It transmits all your  recorded data to [facility]. This box must always stay within 10 feet of you. Open the box and push the *  button. There will be a light that blinks orange and then green a few times. When the light stops  blinking, the Gateway is connected to the ZIO patch. Call [employer] at [phone] to confirm your monitor is transmitting.  Returning your monitor  Remove your patch and place it inside  the [place]. In the lower half of the Gateway there is a white  bag with prepaid postage on it. Place Gateway in bag and seal. Mail package back to [place] as soon as  possible. Your physician should have your final report approximately 7 days after you have mailed back  your monitor. Call [facility] at [phone] if you have questions regarding your ZIO AT  patch monitor. Call them immediately if you see an orange light blinking on your monitor.  If your monitor falls off in less than 4 days, contact our Monitor department at [phone]. If your  monitor becomes loose or falls off after 4 days call [employer] at [phone] for suggestions on  securing your monitor    Follow-Up: At [facility], you and your health needs are our priority.  As part of our continuing mission to provide you with exceptional heart care, we have created designated Provider Care Teams.  These Care Teams include your primary Cardiologist (physician) and Advanced Practice Providers (APPs -  Physician Assistants and Nurse Practitioners) who all work together to provide you with the care you need, when you need it.  We recommend signing up for the patient portal called "MyChart".  Sign up information is provided on this After Visit Summary.  MyChart is used to connect with patients for Virtual Visits (Telemedicine).  Patients are able to view lab/test results, encounter notes, upcoming appointments, etc.  Non-urgent messages can be sent to your provider as well.   To learn more about what you can do with MyChart, go to [email or web address].    Your next appointment:   6 month(s)  The format for your next appointment:   In Person  Provider:   Robbie Lis, PA-C, Christen Bame, NP, or Richardson Dopp, PA-C         Other Instructions

## 2021-03-22 NOTE — Progress Notes (Signed)
Cardiology Office Note:    Date:  03/22/2021   ID:  Paula Bass, DOB August 17, 1970, MRN [id number]  PCP:  Benito Mccreedy, MD  Cardiologist:  Mertie Moores, MD  Electrophysiologist:  None   Referring MD: Benito Mccreedy, MD   Problem list 1.  Coronary artery disease-status post stenting follow-up in [place] in 2017 2.  End-stage renal disease 3.  Hypertension 4.  Hyperlipidemia  Chief Complaint  Patient presents with   Coronary Artery Disease           Feb. 17, 2020    Paula Bass is a 51 y.o. female with a hx of end-stage renal disease, diabetes mellitus, hypertension, lupus who we are asked to see today by Dr. Jeanella Anton for further evaluation of chest discomfort.  She has had repeated episdoes of CP during dialysis .   Seems to be related to the volume and speed of the dialysis.   She has had them slow down dialysis and stop dialysis and the chest pains typically resolve at that point. Mid sterma,  Associated with dyspnea.  No radiation   Does not get any regular exercise  Watches her diet pretty well  Moved from [place] in   Has a hx of CAD with stenting  -she is had at least one stent placed.  She thinks he might of had balloon angioplasty of another vessel.  She thinks these CP are similar to when she was stented in the past.   June 07, 2020: Paula Bass is seen today for follow up of her CAD.  She has CKD and is on dialysis. She was having episodes of CP several months ago and saw [facility], [place] for eval and renal transplant pre op. Scheduled for cath Cath Dec. 17, 2021 showed severe CAD with tight stenosis in LCx/OM and serial 99%-70% stenosis in mid RCA She is s/p stenting in LCX/OM and the RCA No further episodes of CP .   She needs to be cleared for surgery for renal transplantation.  She called to discuss the requirements for renal transplantation and apparently she will be able to continue taking aspirin 81 mg a day.  They need  her off Plavix. We discussed the fact that the safest way to do this would be to wait until she has been on aspirin Plavix for 1 year which would mean that she would stop Plavix in mid December, 2022.  She is not having any episodes of chest pain.   I discussed the case with her transplant coordinator  Richmond Campbell Phone [phone] Fax [phone]  According to [name] she does not need to hold aspirin or Plavix prior to surgery.  They will just hold this medicines on the evening of surgery.  She is at low to moderate risk for her upcoming kidney transplant.  I have given the okay for her to hold the aspirin Plavix for 5 to 7 days following surgery to allow for adequate healing.  Jan. 24, 2023  Paula Bass is seen for follow up of her CAD, Has had her renal transplant. Developed atrial fib during her hospitalization , CHADS2VASC is 2 ( CAD, HTN)  Is back in NSR today  She still has occasional episodes of palpitations =- ? afib ( associated with faster HR and dyspnea)   No angina .   The hospital did an echo, and  myoview  [place] back doing her normal activities.  Past Medical History:  Diagnosis Date   Acute diverticulitis 12/01/2017  Anemia    CAD in native artery    a. PCI to [place] in 2017 in [place]. b.  In 03/2018 she had a repeat cath with 80-90% mid/distal LCx s/p synergy DES, with residual RCA stenosis mg'd medically. c.01/2020 s/p orbital atherectomy/DES to the OM1 and OM2, residual RCA stenosis.   Diabetes mellitus without complication [facility])    Dialysis patient [facility])    Diverticulitis    ESRD (end stage renal disease) ([place])    GI bleeding    Hypertension    LGI bleed 12/16/2017   Lower GI bleed    Lupus (HCC)    Renal disorder    dialysis   UTI (urinary tract infection) 12/01/2017    Past Surgical History:  Procedure Laterality Date   BIOPSY  10/24/2019   Procedure: BIOPSY;  Surgeon: Otis Brace, MD;  Location: [facility];  Service: Gastroenterology;;  EGD and  COLON   CARDIAC SURGERY     cardiac shunt   COLONOSCOPY WITH PROPOFOL N/A 10/24/2019   Procedure: COLONOSCOPY WITH PROPOFOL;  Surgeon: Otis Brace, MD;  Location: [facility];  Service: Gastroenterology;  Laterality: N/A;   CORONARY ATHERECTOMY N/A 02/13/2020   Procedure: CORONARY ATHERECTOMY;  Surgeon: [name], MD;  Location: [place] [facility];  Service: Cardiovascular;  Laterality: N/A;   CORONARY STENT INTERVENTION N/A 04/19/2018   Procedure: CORONARY STENT INTERVENTION;  Surgeon: [name], MD;  Location: [place] [facility];  Service: Cardiovascular;  Laterality: N/A;   CORONARY STENT INTERVENTION N/A 02/13/2020   Procedure: CORONARY STENT INTERVENTION;  Surgeon: [name], MD;  Location: [place] [facility];  Service: Cardiovascular;  Laterality: N/A;   ESOPHAGOGASTRODUODENOSCOPY (EGD) WITH PROPOFOL N/A 10/24/2019   Procedure: ESOPHAGOGASTRODUODENOSCOPY (EGD) WITH PROPOFOL;  Surgeon: Otis Brace, MD;  [facility];  Service: Gastroenterology;  Laterality: N/A;   INCISION AND DRAINAGE PERIRECTAL ABSCESS N/A 07/09/2019   Procedure: IRRIGATION AND DEBRIDEMEN right buttock ABSCESS;  Surgeon: Kieth Brightly, Arta Bruce, MD;  Location: [place];  Service: General;  Laterality: N/A;   INTRAVASCULAR PRESSURE WIRE/FFR STUDY N/A 02/13/2020   Procedure: INTRAVASCULAR PRESSURE WIRE/FFR STUDY;  Surgeon: [name], MD;  Location: [place] [facility];  Service: Cardiovascular;  Laterality: N/A;   Kidney graft Left    LEFT HEART CATH AND CORONARY ANGIOGRAPHY N/A 04/19/2018   Procedure: LEFT HEART CATH AND CORONARY ANGIOGRAPHY;  Surgeon: [name], MD;  Location: [place] [facility];  Service: Cardiovascular;  Laterality: N/A;   LEFT HEART CATH AND CORONARY ANGIOGRAPHY N/A 02/13/2020   Procedure: LEFT HEART CATH AND CORONARY ANGIOGRAPHY;  Surgeon: [name], MD;  Location: [place] [facility];  Service: Cardiovascular;  Laterality: N/A;   POLYPECTOMY   10/24/2019   Procedure: POLYPECTOMY;  Surgeon: Otis Brace, MD;  [facility];  Service: Gastroenterology;;    Current Medications: Current Meds  Medication Sig   acetaminophen (TYLENOL) 325 MG tablet Take 2 tablets (650 mg total) by mouth every 4 (four) hours as needed for fever, headache or mild pain.   albuterol (VENTOLIN HFA) 108 (90 Base) MCG/ACT inhaler 2 Puff(s) By Mouth Every 6 Hours   aspirin EC 81 MG tablet Take 1 tablet (81 mg total) by mouth daily.   atorvastatin (LIPITOR) 40 MG tablet Take 1 tablet (40 mg total) by mouth daily.   clopidogrel (PLAVIX) 75 MG tablet Take 1 tablet (75 mg total) by mouth daily.   famotidine (PEPCID) 20 MG tablet Take 20 mg by mouth daily.   insulin aspart (NOVOLOG) 100 UNIT/ML  FlexPen Inject into the skin. 15 units breakfast, 15units lunch, 15 units dinner   metoprolol tartrate (LOPRESSOR) 25 MG tablet Take 1 tablet (25 mg total) by mouth 2 (two) times daily.   nitroGLYCERIN (NITROSTAT) 0.4 MG SL tablet Place 1 tablet (0.4 mg total) under the tongue every 5 (five) minutes as needed.   nystatin (MYCOSTATIN) 100000 UNIT/ML suspension Take by mouth.   Polyethyl Glycol-Propyl Glycol 0.4-0.3 % SOLN Place 1 drop into both eyes 3 (three) times daily as needed (dry/irritated eyes.).   predniSONE (DELTASONE) 5 MG tablet Take 5 mg by mouth daily with breakfast.   propranolol (INDERAL) 10 MG tablet Take 1 tablet (10 mg total) by mouth 4 (four) times daily as needed (palpitations).   sulfamethoxazole-trimethoprim (BACTRIM DS) 800-160 MG tablet Take by mouth.   tacrolimus (PROGRAF) 1 MG capsule Take by mouth.     Allergies:   Penicillins   Social History   Socioeconomic History   Marital status: Single    Spouse name: Not on file   Number of children: 1   Years of education: Not on file   Highest education level: Not on file  Occupational History   Occupation: disabled  Tobacco Use   Smoking status: Never   Smokeless tobacco: Never   Vaping Use   Vaping Use: Never used  Substance and Sexual Activity   Alcohol use: Never   Drug use: Never   Sexual activity: Not Currently    Partners: Male    Birth control/protection: Condom  Other Topics Concern   Not on file  Social History Narrative   Not on file   Social Determinants of Health   Financial Resource Strain: Not on file  Food Insecurity: Not on file  Transportation Needs: Not on file  Physical Activity: Not on file  Stress: Not on file  Social Connections: Not on file     Family History: The patient's family history includes Healthy in her brother and brother; Hypertension in her mother; Other in her father. There is no history of Colon cancer, Esophageal cancer, or Breast cancer.  ROS:   Please see the history of present illness.     All other systems reviewed and are negative.  EKGs/Labs/Other Studies Reviewed:    The following studies were reviewed today:   EKG:    jan. 24, 2023 NSR , occasional PAC    Recent Labs: No results found for requested labs within last 8760 hours.  Recent Lipid Panel    Component Value Date/Time   CHOL 120 04/15/2018 0948   TRIG 92 04/15/2018 0948   HDL 59 04/15/2018 0948   CHOLHDL 2.0 04/15/2018 0948   LDLCALC 43 04/15/2018 0948    Physical Exam:    Physical Exam: Blood pressure 104/60, pulse 81, height '5\' 2"'  (1.575 m), weight 133 lb 12.8 oz (60.7 kg), last menstrual period 02/16/2018.  GEN:  Well nourished, well developed in no acute distress HEENT: Normal NECK: No JVD; No carotid bruits LYMPHATICS: No lymphadenopathy CARDIAC: RRR , no murmurs, rubs, gallops RESPIRATORY:  Clear to auscultation without rales, wheezing or rhonchi  ABDOMEN: Soft, non-tender, non-distended MUSCULOSKELETAL:  No edema; No deformity  SKIN: Warm and dry NEUROLOGIC:  Alert and oriented x 3   ASSESSMENT:    1. Coronary artery disease involving native coronary artery of native heart with angina pectoris ([place])   2.  Essential hypertension   3. Hyperlipidemia LDL goal <70   4. Palpitations    PLAN:  1.  Coronary artery disease:  no angina  2.  PAF:  she had PAF following her transplant. Was not started on [place].   Will place a 14 day monitor to see if she is still having Afib.   She has a chads2vasc score of at least 3-4( female, htn ( now resolved after transplant) cad, DM)  If she is having lots of Afib, will need to consider anticogulation    3. ESRD - s/p renal transplant          Medication Adjustments/Labs and Tests Ordered: Current medicines are reviewed at length with the patient today.  Concerns regarding medicines are outlined above.  Orders Placed This Encounter  Procedures   TSH   Basic Metabolic Panel (BMET)   LONG TERM MONITOR-LIVE TELEMETRY (3-14 DAYS)   EKG 12-Lead   Meds ordered this encounter  Medications   propranolol (INDERAL) 10 MG tablet    Sig: Take 1 tablet (10 mg total) by mouth 4 (four) times daily as needed (palpitations).    Dispense:  90 tablet    Refill:  3    Patient Instructions   Medication Instructions:  Your physician has recommended you make the following change in your medication:   STOP Amlodipine  STOP Isosorbide  START Propranolol 10 mg taking 1 tablet 4X's daily as needed for Palpitations   *If you need a refill on your cardiac medications before your next appointment, please call your pharmacy*   Lab Work: TODAY:  TSH & BMP  If you have labs (blood work) drawn today and your tests are completely normal, you will receive your results only by: [place] (if you have MyChart) OR A paper copy in the mail If you have any lab test that is abnormal or we need to change your treatment, we will call you to review the results.   Testing/Procedures: ZIO AT Long term monitor-Live Telemetry  Your physician has requested you wear a ZIO patch monitor for 14 days.  This is a single patch monitor. [employer] supplies one patch monitor  per enrollment. Additional  stickers are not available.  Please do not apply patch if you will be having a Nuclear Stress Test, Echocardiogram, Cardiac CT, MRI,  or Chest Xray during the period you would be wearing the monitor. The patch cannot be worn during  these tests. You cannot remove and re-apply the ZIO AT patch monitor.  Your ZIO patch monitor will be mailed 3 day USPS to your address on file. It may take 3-5 days to  receive your monitor after you have been enrolled.  Once you have received your monitor, please review the enclosed instructions. Your monitor has  already been registered assigning a specific monitor serial # to you.   Billing and Patient Assistance Program information  Theodore Demark has been supplied with any insurance information on record for billing. [employer] offers a sliding scale Patient Assistance Program for patients without insurance, or whose  insurance does not completely cover the cost of the ZIO patch monitor. You must apply for the  Patient Assistance Program to qualify for the discounted rate. To apply, call [employer] at [phone],  select option 4, select option 2 , ask to apply for the Patient Assistance Program, (you can request an  interpreter if needed). [employer] will ask your household income and how many people are in your  household. [employer] will quote your out-of-pocket cost based on this information. They will also be able  to set up a 12  month interest free payment plan if needed.  Applying the monitor   Shave hair from upper left chest.  Hold the abrader disc by orange tab. Rub the abrader in 40 strokes over left upper chest as indicated in  your monitor instructions.  Clean area with 4 enclosed alcohol pads. Use all pads to ensure the area is cleaned thoroughly. Let  dry.  Apply patch as indicated in monitor instructions. Patch will be placed under collarbone on left side of  chest with arrow pointing upward.  Rub patch adhesive wings for 2  minutes. Remove the white label marked "1". Remove the white label  marked "2". Rub patch adhesive wings for 2 additional minutes.  While looking in a mirror, press and release button in center of patch. A small green light will flash 3-4  times. This will be your only indicator that the monitor has been turned on.  Do not shower for the first 24 hours. You may shower after the first 24 hours.  Press the button if you feel a symptom. You will hear a small click. Record Date, Time and Symptom in  the Patient Log.   Starting the Gateway  In your kit there is a Hydrographic surveyor box the size of a cellphone. This is Airline pilot. It transmits all your  recorded data to [facility]. This box must always stay within 10 feet of you. Open the box and push the *  button. There will be a light that blinks orange and then green a few times. When the light stops  blinking, the Gateway is connected to the ZIO patch. Call [employer] at [phone] to confirm your monitor is transmitting.  Returning your monitor  Remove your patch and place it inside the [place]. In the lower half of the Gateway there is a white  bag with prepaid postage on it. Place Gateway in bag and seal. Mail package back to [place] as soon as  possible. Your physician should have your final report approximately 7 days after you have mailed back  your monitor. Call [place] at [phone] if you have questions regarding your ZIO AT  patch monitor. Call them immediately if you see an orange light blinking on your monitor.  If your monitor falls off in less than 4 days, contact our Monitor department at [phone]. If your  monitor becomes loose or falls off after 4 days call [employer] at [phone] for suggestions on  securing your monitor    Follow-Up: At [facility], you and your health needs are our priority.  As part of our continuing mission to provide you with exceptional heart care, we have created  designated Provider Care Teams.  These Care Teams include your primary Cardiologist (physician) and Advanced Practice Providers (APPs -  Physician Assistants and Nurse Practitioners) who all work together to provide you with the care you need, when you need it.  We recommend signing up for the patient portal called "MyChart".  Sign up information is provided on this After Visit Summary.  MyChart is used to connect with patients for Virtual Visits (Telemedicine).  Patients are able to view lab/test results, encounter notes, upcoming appointments, etc.  Non-urgent messages can be sent to your provider as well.   To learn more about what you can do with MyChart, go to [email or web address].    Your next appointment:   6 month(s)  The format for your next appointment:   In Person  Provider:   Robbie Lis, Revonda Humphrey  Swinyer, NP, or Richardson Dopp, PA-C         Other Instructions     Signed, Mertie Moores, MD  03/22/2021 5:28 PM    [place]

## 2021-03-22 NOTE — Progress Notes (Unsigned)
Enrolled patient for a 14 day Zio AT monitor to be mailed to patients home.  

## 2021-03-24 DIAGNOSIS — E785 Hyperlipidemia, unspecified: Secondary | ICD-10-CM

## 2021-03-24 DIAGNOSIS — R002 Palpitations: Secondary | ICD-10-CM | POA: Diagnosis not present

## 2021-03-24 DIAGNOSIS — I10 Essential (primary) hypertension: Secondary | ICD-10-CM | POA: Diagnosis not present

## 2021-03-24 DIAGNOSIS — I25119 Atherosclerotic heart disease of native coronary artery with unspecified angina pectoris: Secondary | ICD-10-CM | POA: Diagnosis not present

## 2021-03-25 DIAGNOSIS — I10 Essential (primary) hypertension: Secondary | ICD-10-CM | POA: Diagnosis not present

## 2021-03-25 DIAGNOSIS — E663 Overweight: Secondary | ICD-10-CM | POA: Diagnosis not present

## 2021-03-25 DIAGNOSIS — N186 End stage renal disease: Secondary | ICD-10-CM | POA: Diagnosis not present

## 2021-03-25 DIAGNOSIS — E782 Mixed hyperlipidemia: Secondary | ICD-10-CM | POA: Diagnosis not present

## 2021-03-25 DIAGNOSIS — N898 Other specified noninflammatory disorders of vagina: Secondary | ICD-10-CM | POA: Diagnosis not present

## 2021-03-25 DIAGNOSIS — Z992 Dependence on renal dialysis: Secondary | ICD-10-CM | POA: Diagnosis not present

## 2021-03-25 DIAGNOSIS — G629 Polyneuropathy, unspecified: Secondary | ICD-10-CM | POA: Diagnosis not present

## 2021-03-25 DIAGNOSIS — Z794 Long term (current) use of insulin: Secondary | ICD-10-CM | POA: Diagnosis not present

## 2021-03-25 DIAGNOSIS — E1122 Type 2 diabetes mellitus with diabetic chronic kidney disease: Secondary | ICD-10-CM | POA: Diagnosis not present

## 2021-03-25 DIAGNOSIS — M1990 Unspecified osteoarthritis, unspecified site: Secondary | ICD-10-CM | POA: Diagnosis not present

## 2021-03-26 DIAGNOSIS — I25119 Atherosclerotic heart disease of native coronary artery with unspecified angina pectoris: Secondary | ICD-10-CM | POA: Diagnosis not present

## 2021-03-26 DIAGNOSIS — R002 Palpitations: Secondary | ICD-10-CM | POA: Diagnosis not present

## 2021-03-26 DIAGNOSIS — I10 Essential (primary) hypertension: Secondary | ICD-10-CM | POA: Diagnosis not present

## 2021-03-26 DIAGNOSIS — E785 Hyperlipidemia, unspecified: Secondary | ICD-10-CM | POA: Diagnosis not present

## 2021-04-01 DIAGNOSIS — E113392 Type 2 diabetes mellitus with moderate nonproliferative diabetic retinopathy without macular edema, left eye: Secondary | ICD-10-CM | POA: Diagnosis not present

## 2021-04-01 DIAGNOSIS — H524 Presbyopia: Secondary | ICD-10-CM | POA: Diagnosis not present

## 2021-04-01 DIAGNOSIS — E113291 Type 2 diabetes mellitus with mild nonproliferative diabetic retinopathy without macular edema, right eye: Secondary | ICD-10-CM | POA: Diagnosis not present

## 2021-04-01 DIAGNOSIS — H25813 Combined forms of age-related cataract, bilateral: Secondary | ICD-10-CM | POA: Diagnosis not present

## 2021-04-01 DIAGNOSIS — H5212 Myopia, left eye: Secondary | ICD-10-CM | POA: Diagnosis not present

## 2021-04-01 DIAGNOSIS — H4311 Vitreous hemorrhage, right eye: Secondary | ICD-10-CM | POA: Diagnosis not present

## 2021-04-01 DIAGNOSIS — H52222 Regular astigmatism, left eye: Secondary | ICD-10-CM | POA: Diagnosis not present

## 2021-04-01 DIAGNOSIS — H04123 Dry eye syndrome of bilateral lacrimal glands: Secondary | ICD-10-CM | POA: Diagnosis not present

## 2021-04-01 DIAGNOSIS — H35033 Hypertensive retinopathy, bilateral: Secondary | ICD-10-CM | POA: Diagnosis not present

## 2021-04-01 DIAGNOSIS — M3210 Systemic lupus erythematosus, organ or system involvement unspecified: Secondary | ICD-10-CM | POA: Diagnosis not present

## 2021-04-11 DIAGNOSIS — E1122 Type 2 diabetes mellitus with diabetic chronic kidney disease: Secondary | ICD-10-CM | POA: Diagnosis not present

## 2021-04-11 DIAGNOSIS — N390 Urinary tract infection, site not specified: Secondary | ICD-10-CM | POA: Diagnosis not present

## 2021-04-11 DIAGNOSIS — Z940 Kidney transplant status: Secondary | ICD-10-CM | POA: Diagnosis not present

## 2021-04-15 DIAGNOSIS — E108 Type 1 diabetes mellitus with unspecified complications: Secondary | ICD-10-CM | POA: Diagnosis not present

## 2021-04-15 DIAGNOSIS — M545 Low back pain, unspecified: Secondary | ICD-10-CM | POA: Diagnosis not present

## 2021-04-15 DIAGNOSIS — R221 Localized swelling, mass and lump, neck: Secondary | ICD-10-CM | POA: Diagnosis not present

## 2021-04-15 DIAGNOSIS — Z794 Long term (current) use of insulin: Secondary | ICD-10-CM | POA: Diagnosis not present

## 2021-05-06 DIAGNOSIS — J069 Acute upper respiratory infection, unspecified: Secondary | ICD-10-CM | POA: Diagnosis not present

## 2021-05-09 DIAGNOSIS — Z940 Kidney transplant status: Secondary | ICD-10-CM | POA: Diagnosis not present

## 2021-05-15 ENCOUNTER — Other Ambulatory Visit: Payer: Self-pay | Admitting: Physician Assistant

## 2021-05-15 DIAGNOSIS — E108 Type 1 diabetes mellitus with unspecified complications: Secondary | ICD-10-CM | POA: Diagnosis not present

## 2021-05-15 DIAGNOSIS — Z794 Long term (current) use of insulin: Secondary | ICD-10-CM | POA: Diagnosis not present

## 2021-05-18 ENCOUNTER — Other Ambulatory Visit: Payer: Self-pay | Admitting: Internal Medicine

## 2021-05-18 DIAGNOSIS — Z1231 Encounter for screening mammogram for malignant neoplasm of breast: Secondary | ICD-10-CM

## 2021-05-20 DIAGNOSIS — N186 End stage renal disease: Secondary | ICD-10-CM | POA: Diagnosis not present

## 2021-05-20 DIAGNOSIS — I10 Essential (primary) hypertension: Secondary | ICD-10-CM | POA: Diagnosis not present

## 2021-05-20 DIAGNOSIS — M1990 Unspecified osteoarthritis, unspecified site: Secondary | ICD-10-CM | POA: Diagnosis not present

## 2021-05-20 DIAGNOSIS — E782 Mixed hyperlipidemia: Secondary | ICD-10-CM | POA: Diagnosis not present

## 2021-05-20 DIAGNOSIS — E663 Overweight: Secondary | ICD-10-CM | POA: Diagnosis not present

## 2021-05-20 DIAGNOSIS — E1122 Type 2 diabetes mellitus with diabetic chronic kidney disease: Secondary | ICD-10-CM | POA: Diagnosis not present

## 2021-05-20 DIAGNOSIS — Z794 Long term (current) use of insulin: Secondary | ICD-10-CM | POA: Diagnosis not present

## 2021-05-20 DIAGNOSIS — E559 Vitamin D deficiency, unspecified: Secondary | ICD-10-CM | POA: Diagnosis not present

## 2021-05-20 DIAGNOSIS — Z992 Dependence on renal dialysis: Secondary | ICD-10-CM | POA: Diagnosis not present

## 2021-05-20 DIAGNOSIS — G629 Polyneuropathy, unspecified: Secondary | ICD-10-CM | POA: Diagnosis not present

## 2021-05-27 ENCOUNTER — Encounter (HOSPITAL_BASED_OUTPATIENT_CLINIC_OR_DEPARTMENT_OTHER): Payer: Medicare HMO | Attending: General Surgery | Admitting: General Surgery

## 2021-05-27 DIAGNOSIS — M3210 Systemic lupus erythematosus, organ or system involvement unspecified: Secondary | ICD-10-CM | POA: Insufficient documentation

## 2021-05-27 DIAGNOSIS — E1140 Type 2 diabetes mellitus with diabetic neuropathy, unspecified: Secondary | ICD-10-CM | POA: Diagnosis not present

## 2021-05-27 DIAGNOSIS — Z7960 Long term (current) use of unspecified immunomodulators and immunosuppressants: Secondary | ICD-10-CM | POA: Insufficient documentation

## 2021-05-27 DIAGNOSIS — I10 Essential (primary) hypertension: Secondary | ICD-10-CM | POA: Insufficient documentation

## 2021-05-27 DIAGNOSIS — E11621 Type 2 diabetes mellitus with foot ulcer: Secondary | ICD-10-CM | POA: Insufficient documentation

## 2021-05-27 DIAGNOSIS — L97522 Non-pressure chronic ulcer of other part of left foot with fat layer exposed: Secondary | ICD-10-CM | POA: Insufficient documentation

## 2021-05-27 DIAGNOSIS — Z940 Kidney transplant status: Secondary | ICD-10-CM | POA: Insufficient documentation

## 2021-05-27 DIAGNOSIS — N186 End stage renal disease: Secondary | ICD-10-CM | POA: Diagnosis not present

## 2021-05-27 NOTE — Progress Notes (Signed)
Paula Bass, Paula E. ([id number]) ?Visit Report for 05/27/2021 ?Chief Complaint Document Details ?Patient Name: Date of Service: ?[name], [name] 05/27/2021 8:00 A M ?Medical Record Number: [id number] ?Patient Account Number: [id number] ?Date of Birth/Sex: Treating RN: ?Sep 09, 1970 (51 y.o. F) Scotton, [name] ?Primary Care Provider: Benito Mccreedy Other Clinician: ?Referring Provider: ?Treating Provider/Extender: [name] ?[name], [name]: 0 ?Information Obtained from: Patient ?Chief Complaint ?Patients presents for treatment of an open diabetic ulcer ?Electronic Signature(s) ?Signed: 05/27/2021 9:03:13 AM By: [name] MD FACS ?Entered By: [name] on 05/27/2021 09:03:13 ?-------------------------------------------------------------------------------- ?Debridement Details ?Patient Name: Date of Service: ?[name], [name] 05/27/2021 8:00 A M ?Medical Record Number: [id number] ?Patient Account Number: [id number] ?Date of Birth/Sex: Treating RN: ?1970-11-02 (51 y.o. Paula Bass ?Primary Care Provider: Benito Mccreedy Other Clinician: ?Referring Provider: ?Treating Provider/Extender: [name] ?[name], [name]: 0 ?Debridement Performed for Assessment: Wound #1 Left,Plantar T Great ?oe ?Performed By: Physician [name], MD ?Debridement Type: Debridement ?Severity of Tissue Pre Debridement: Fat layer exposed ?Level of Consciousness (Pre-procedure): Awake and Alert ?Pre-procedure Verification/Time Out Yes - 08:53 ?Taken: ?Start Time: 08:53 ?T Area Debrided (L x W): ?otal 0.7 (cm) x 0.6 (cm) = 0.42 (cm?) ?Tissue and other material debrided: Non-Viable, Callus ?Level: Non-Viable Tissue ?Debridement Description: Selective/Open Wound ?Instrument: Curette ?Bleeding: Minimum ?Hemostasis Achieved: Pressure ?End Time: 08:55 ?Procedural Pain: 0 ?Post Procedural Pain: 0 ?Response to Treatment: Procedure was tolerated well ?Level of  Consciousness (Post- Awake and Alert ?procedure): ?Post Debridement Measurements of Total Wound ?Length: (cm) 0.7 ?Width: (cm) 0.6 ?Depth: (cm) 0.1 ?Volume: (cm?) 0.033 ?Character of Wound/Ulcer Post Debridement: Improved ?Severity of Tissue Post Debridement: Fat layer exposed ?Post Procedure Diagnosis ?Same as Pre-procedure ?Electronic Signature(s) ?Signed: 05/27/2021 12:08:53 PM By: [name] MD FACS ?Signed: 05/27/2021 4:27:25 PM By: Levan Hurst RN, BSN ?Entered By: Levan Hurst on 05/27/2021 08:53:58 ?-------------------------------------------------------------------------------- ?HPI Details ?Patient Name: Date of Service: ?[name], [name] 05/27/2021 8:00 A M ?Medical Record Number: [id number] ?Patient Account Number: [id number] ?Date of Birth/Sex: Treating RN: ?07-Dec-1970 (51 y.o. F) Scotton, [name] ?Primary Care Provider: Benito Mccreedy Other Clinician: ?Referring Provider: ?Treating Provider/Extender: [name] ?[name], [name]: 0 ?History of Present Illness ?HPI Description: ADMISSION ?05/27/2021 ?This is a 51 year old woman with a past medical history notable for type 2 diabetes mellitus with neuropathy, end-stage renal disease status post renal ?transplantation in August 2022, systemic lupus erythematosus, and hypertension. She says that about a week ago, she was picking what she thought was ?dead skin off of her left great toe, but secondary to the neuropathy, she was unaware that she had opened up a fairly large hole in the toe. She saw her ?primary care provider on March 27. She was diagnosed with cellulitis and placed on a 10-day course of Keflex. She is here today for further evaluation and ?management of a nonhealing ulcer on her left great toe. She says that it sometimes drains serosanguineous fluid, but she has not noticed any pus or foul ?odor. No pain. No fevers or chills. She is on chronic immunosuppressants for her renal transplant. ?On the plantar  surface of the left great toe, there is a circular wound surrounded by callus. The base of the wound is clean without slough or other debris. Fat ?layer is exposed. ?Electronic Signature(s) ?Signed: 05/27/2021 9:05:30 AM By: [name] MD FACS ?Entered By: [name] on 05/27/2021 09:05:30 ?-------------------------------------------------------------------------------- ?Physical Exam Details ?Patient Name: Date of Service: ?[name], [name] 05/27/2021 8:00 A M ?  Medical Record Number: [id number] ?Patient Account Number: [id number] ?Date of Birth/Sex: Treating RN: ?November 26, 1970 (51 y.o. F) Scotton, [name] ?Primary Care Provider: Benito Mccreedy Other Clinician: ?Referring Provider: ?Treating Provider/Extender: [name] ?[name], [name]: 0 ?Constitutional ?. . . . No acute distress. ?Respiratory ?Normal work of breathing on room air.Marland Kitchen ?Notes ?05/27/2021: Wound examon the plantar surface of her left great toe, there is a circular wound surrounded by callus. There is no slough or debris on the wound ?surface. No erythema or induration. No purulent drainage. ?Electronic Signature(s) ?Signed: 05/27/2021 9:06:34 AM By: [name] MD FACS ?Entered By: [name] on 05/27/2021 09:06:33 ?-------------------------------------------------------------------------------- ?Physician Orders Details ?Patient Name: ?Date of Service: ?[name], [name] 05/27/2021 8:00 A M ?Medical Record Number: [id number] ?Patient Account Number: [id number] ?Date of Birth/Sex: ?Treating RN: ?[birth year] (51 y.o. Paula Bass ?Primary Care Provider: Benito Mccreedy ?Other Clinician: ?Referring Provider: ?Treating Provider/Extender: [name] ?[name], [name]: 0 ?Verbal / Phone Orders: No ?Diagnosis Coding ?ICD-10 Coding ?Code Description ?J03.009 Non-pressure chronic ulcer of other part of left foot with fat layer exposed ?E11.621 Type 2 diabetes mellitus with  foot ulcer ?M32.10 Systemic lupus erythematosus, organ or system involvement unspecified ?Follow-up Appointments ?ppointment in 1 week. - Dr. Celine Ahr - Room 2 ?Return A ?Bathing/ Shower/ Hygiene ?May shower and wash wound with soap and water. - prior to dressing change ?Off-Loading ?Wedge shoe to: - front offloading sandal to left foot ?Wound Treatment ?Wound #1 - T Great ?oe Wound Laterality: Plantar, Left ?Cleanser: Soap and Water 1 x Per Day/15 Days ?Discharge Instructions: May shower and wash wound with dial antibacterial soap and water prior to dressing change. ?Cleanser: Byram Ancillary Kit - 15 Day Supply (DME) (Generic) 1 x Per Day/15 Days ?Discharge Instructions: Use supplies as instructed; Kit contains: (15) Saline Bullets; (15) 3x3 Gauze; 15 pr Gloves ?Prim Dressing: KerraCel Ag Gelling Fiber Dressing, 2x2 in (silver alginate) (DME) (Generic) 1 x Per Day/15 Days ?ary ?Discharge Instructions: Apply silver alginate to wound bed as instructed ?Secondary Dressing: Woven Gauze Sponges 2x2 in (DME) (Generic) 1 x Per Day/15 Days ?Discharge Instructions: Apply over primary dressing as directed. ?Secondary Dressing: Optifoam Non-Adhesive Dressing, 4x4 in (DME) (Generic) 1 x Per Day/15 Days ?Discharge Instructions: Apply foam donut over primary dressing as directed. ?Secured With: Child psychotherapist, Sterile 2x75 (in/in) (DME) (Generic) 1 x Per Day/15 Days ?Discharge Instructions: Secure with stretch gauze as directed. ?Secured With: 64M Medipore Public affairs consultant Surgical T 2x10 (in/yd) (DME) (Generic) 1 x Per Day/15 Days ?ape ?Discharge Instructions: Secure with tape as directed. ?Electronic Signature(s) ?Signed: 05/27/2021 12:08:53 PM By: [name] MD FACS ?Entered By: [name] on 05/27/2021 09:07:00 ?-------------------------------------------------------------------------------- ?Problem List Details ?Patient Name: Date of Service: ?Aguillard, [name] 05/27/2021 8:00 A M ?Medical Record  Number: [id number] ?Patient Account Number: [id number] ?Date of Birth/Sex: Treating RN: ?1970-06-16 (51 y.o. F) Scotton, [name] ?Primary Care Provider: Benito Mccreedy Other Clinician: ?Referring Provider: ?Treating

## 2021-05-27 NOTE — Progress Notes (Signed)
[name], [name] ([id number]) ?Visit Report for 05/27/2021 ?Abuse Risk Screen Details ?Patient Name: Date of Service: ?Paula Bass, [name] 05/27/2021 8:00 A M ?Medical Record Number: [id number] ?Patient Account Number: [id number] ?Date of Birth/Sex: Treating RN: ?1970-09-19 (51 y.o. Paula Bass ?Primary Care Paula Bass: Paula Bass Other Clinician: ?Referring Paula Bass: ?Treating Paula Bass/Extender: Paula Bass ?Paula Bass ?Paula Bass: 0 ?Abuse Risk Screen Items ?Answer ?ABUSE RISK SCREEN: ?Has anyone close to you tried to hurt or harm you recentlyo No ?Do you feel uncomfortable with anyone in your familyo No ?Has anyone forced you do things that you didnt want to doo No ?Electronic Signature(s) ?Signed: 05/27/2021 4:27:25 PM By: Paula Hurst RN, BSN ?Entered By: Paula Bass on 05/27/2021 08:32:51 ?-------------------------------------------------------------------------------- ?Activities of Daily Living Details ?Patient Name: Date of Service: ?Paula Bass, [name] 05/27/2021 8:00 A M ?Medical Record Number: [id number] ?Patient Account Number: [id number] ?Date of Birth/Sex: Treating RN: ?Paula 20, 1972 (51 y.o. Paula Bass ?Primary Care Paula Bass: Paula Bass Other Clinician: ?Referring Paula Bass: ?Treating Paula Bass: Paula Bass ?Paula Bass ?Paula Bass: 0 ?Activities of Daily Living Items ?Answer ?Activities of Daily Living (Please select one for each item) ?[place] ?T Medications ?ake Completely Able ?Use T elephone Completely Able ?Care for Appearance Completely Able ?Use T oilet Completely Able ?Bath / Shower Completely Able ?Dress Self Completely Able ?Feed Self Completely Able ?Walk Completely Able ?Get In / Out Bed Completely Able ?Housework Completely Able ?Prepare Meals Completely Able ?Handle Money Completely Able ?Shop for Self Completely Able ?Electronic Signature(s) ?Signed: 05/27/2021 4:27:25 PM By: Paula Hurst  RN, BSN ?Entered By: Paula Bass on 05/27/2021 08:33:16 ?-------------------------------------------------------------------------------- ?Education Screening Details ?Patient Name: ?Date of Service: ?Paula Bass, [name] 05/27/2021 8:00 A M ?Medical Record Number: [id number] ?Patient Account Number: [id number] ?Date of Birth/Sex: ?Treating RN: ?[birth year] (51 y.o. Paula Bass ?Primary Care Jalayah Gutridge: Paula Bass ?Other Clinician: ?Referring Lorenz Donley: ?Treating Paula Bass: Paula Bass ?Paula Bass ?Paula Bass: 0 ?Primary Learner Assessed: Patient ?Learning Preferences/Education Level/Primary Language ?Learning Preference: Explanation, Demonstration, Printed Material ?Highest Education Level: College or Above ?Preferred Language: English ?Cognitive Barrier ?Language Barrier: No ?Translator Needed: No ?Memory Deficit: No ?Emotional Barrier: No ?Cultural/Religious Beliefs Affecting Medical Care: No ?Physical Barrier ?Impaired Vision: No ?Impaired Hearing: No ?Decreased Hand dexterity: No ?Knowledge/Comprehension ?Knowledge Level: High ?Comprehension Level: High ?Ability to understand written instructions: High ?Ability to understand verbal instructions: High ?Motivation ?Anxiety Level: Calm ?Cooperation: Cooperative ?Education Importance: Acknowledges Need ?Interest in Health Problems: Asks Questions ?Perception: Coherent ?Willingness to Engage in Self-Management High ?Activities: ?Readiness to Engage in Self-Management High ?Activities: ?Electronic Signature(s) ?Signed: 05/27/2021 4:27:25 PM By: Paula Hurst RN, BSN ?Entered By: Paula Bass on 05/27/2021 08:33:32 ?-------------------------------------------------------------------------------- ?Fall Risk Assessment Details ?Patient Name: ?Date of Service: ?Paula Bass, [name] 05/27/2021 8:00 A M ?Medical Record Number: [id number] ?Patient Account Number: [id number] ?Date of Birth/Sex: ?Treating RN: ?[birth year] (51 y.o. Paula Sprague,  Bass ?Primary Care Paula Bass: Paula Bass ?Other Clinician: ?Referring Paula Bass: ?Treating Paula Bass: Paula Bass ?Paula Bass ?Paula Bass: 0 ?Fall Risk Assessment Items ?Have you had 2 or more falls in the last 12 monthso 0 No ?Have you had any fall that resulted in injury in the last 12 monthso 0 No ?FALLS RISK SCREEN ?History of falling - immediate or within 3 months 25 Yes ?Secondary diagnosis (Do you have 2 or more medical diagnoseso) 0 No ?Ambulatory aid ?None/bed rest/wheelchair/nurse 0 Yes ?Crutches/cane/walker 0 No ?Furniture 0 No ?Intravenous therapy Access/Saline/Heparin Lock 0 No ?Gait/Transferring ?Normal/ bed rest/ wheelchair 0  Yes ?Weak (short steps with or without shuffle, stooped but able to lift head while walking, may seek 0 No ?support from furniture) ?Impaired (short steps with shuffle, may have difficulty arising from chair, head down, impaired 0 No ?balance) ?Mental Status ?Oriented to own ability 0 Yes ?Electronic Signature(s) ?Signed: 05/27/2021 4:27:25 PM By: Paula Hurst RN, BSN ?Entered By: Paula Bass on 05/27/2021 08:33:51 ?-------------------------------------------------------------------------------- ?Foot Assessment Details ?Patient Name: ?Date of Service: ?Paula Bass, [name] 05/27/2021 8:00 A M ?Medical Record Number: [id number] ?Patient Account Number: [id number] ?Date of Birth/Sex: ?Treating RN: ?[birth year] (51 y.o. Paula Bass ?Primary Care Paula Bass: Paula Bass ?Other Clinician: ?Referring Paula Bass: ?Treating Paula Bass/Extender: Paula Bass ?Paula Bass ?Paula Bass: 0 ?Foot Assessment Items ?Site Locations ?+ = Sensation present, - = Sensation absent, C = Callus, U = Ulcer ?R = Redness, W = Warmth, M = Maceration, PU = Pre-ulcerative lesion ?F = Fissure, S = Swelling, D = Dryness ?Assessment ?Right: Left: ?Other Deformity: No No ?Prior Foot Ulcer: No No ?Prior Amputation: No No ?Charcot Joint: No  No ?Ambulatory Status: Ambulatory Without Help ?Gait: Steady ?Electronic Signature(s) ?Signed: 05/27/2021 4:27:25 PM By: Paula Hurst RN, BSN ?Entered By: Paula Bass on 05/27/2021 08:35:15 ?-------------------------------------------------------------------------------- ?Nutrition Risk Screening Details ?Patient Name: ?Date of Service: ?Paula Bass, [name] 05/27/2021 8:00 A M ?Medical Record Number: [id number] ?Patient Account Number: [id number] ?Date of Birth/Sex: ?Treating RN: ?02/02/1971 (51 y.o. Paula Bass ?Primary Care Earlyn Sylvan: Paula Bass ?Other Clinician: ?Referring Zacari Stiff: ?Treating Jaxiel Kines/Extender: Paula Bass ?Paula Bass ?Paula Bass: 0 ?Height (in): 62 ?Weight (lbs): 134 ?Body Mass Index (BMI): 24.5 ?Nutrition Risk Screening Items ?Score Screening ?NUTRITION RISK SCREEN: ?I have an illness or condition that made me change the kind and/or amount of food I eat 2 Yes ?I eat fewer than two meals per day 0 No ?I eat few fruits and vegetables, or milk products 0 No ?I have three or more drinks of beer, liquor or wine almost every day 0 No ?I have tooth or mouth problems that make it hard for me to eat 0 No ?I don't always have enough money to buy the food I need 0 No ?I eat alone most of the time 0 No ?I take three or more different prescribed or over-the-counter drugs a day 1 Yes ?Without wanting to, I have lost or gained 10 pounds in the last six months 0 No ?I am not always physically able to shop, cook and/or feed myself 0 No ?Nutrition Protocols ?Good Risk Protocol ?Moderate Risk Protocol 0 Provide education on nutrition ?High Risk Proctocol ?Risk Level: Moderate Risk ?Score: 3 ?Electronic Signature(s) ?Signed: 05/27/2021 4:27:25 PM By: Paula Hurst RN, BSN ?Entered By: Paula Bass on 05/27/2021 08:33:58 ?

## 2021-05-30 DIAGNOSIS — I129 Hypertensive chronic kidney disease with stage 1 through stage 4 chronic kidney disease, or unspecified chronic kidney disease: Secondary | ICD-10-CM | POA: Diagnosis not present

## 2021-05-30 DIAGNOSIS — Z940 Kidney transplant status: Secondary | ICD-10-CM | POA: Diagnosis not present

## 2021-05-30 DIAGNOSIS — Z79899 Other long term (current) drug therapy: Secondary | ICD-10-CM | POA: Diagnosis not present

## 2021-05-30 DIAGNOSIS — M3214 Glomerular disease in systemic lupus erythematosus: Secondary | ICD-10-CM | POA: Diagnosis not present

## 2021-05-30 DIAGNOSIS — E1122 Type 2 diabetes mellitus with diabetic chronic kidney disease: Secondary | ICD-10-CM | POA: Diagnosis not present

## 2021-05-30 DIAGNOSIS — I2510 Atherosclerotic heart disease of native coronary artery without angina pectoris: Secondary | ICD-10-CM | POA: Diagnosis not present

## 2021-05-30 NOTE — Progress Notes (Signed)
TALLULA, GRINDLE E. ([id number]) ?Visit Report for 05/27/2021 ?Allergy List Details ?Patient Name: Date of Service: ?Paula Bass, [name] 05/27/2021 8:00 A M ?Medical Record Number: [id number] ?Patient Account Number: [id number] ?Date of Birth/Sex: Treating RN: ?1970/11/21 (51 y.o. Paula Bass ?Primary Care Paula Bass: Paula Bass Other Clinician: ?Referring Paula Bass: ?Treating Paula Bass/Extender: Paula Bass ?Paula Bass ?Weeks in Treatment: 0 ?Allergies ?Active Allergies ?penicillin ?Reaction: hives ?Severity: Moderate ?Allergy Notes ?Electronic Signature(s) ?Signed: 05/27/2021 4:27:25 PM By: Levan Hurst RN, BSN ?Entered By: Levan Hurst on 05/27/2021 08:29:48 ?-------------------------------------------------------------------------------- ?Arrival Information Details ?Patient Name: Date of Service: ?[name], [name] 05/27/2021 8:00 A M ?Medical Record Number: [id number] ?Patient Account Number: [id number] ?Date of Birth/Sex: Treating RN: ?Paula Bass ?Primary Care Asyah Candler: Paula Bass Other Clinician: ?Referring Kambria Paula Bass: ?Treating Paula Bass ?Paula Bass ?Weeks in Treatment: 0 ?Visit Information ?Patient Arrived: Ambulatory ?Arrival Time: 08:07 ?Accompanied By: self ?Transfer Assistance: None ?Patient Identification Verified: Yes ?Secondary Verification Process Completed: Yes ?Patient Requires Transmission-Based Precautions: No ?Patient Has Alerts: No ?Electronic Signature(s) ?Signed: 05/27/2021 4:27:25 PM By: Levan Hurst RN, BSN ?Previous Signature: 05/27/2021 8:14:42 AM Version By: Sandre Kitty ?Entered By: Levan Hurst on 05/27/2021 08:29:31 ?-------------------------------------------------------------------------------- ?Clinic Level of Care Assessment Details ?Patient Name: Date of Service: ?Paula Bass, [name] 05/27/2021 8:00 A M ?Medical Record Number: [id number] ?Patient Account Number: [id number] ?Date of  Birth/Sex: Treating RN: ?Paula Bass ?Primary Care Duvan Mousel: Paula Bass Other Clinician: ?Referring Nakeysha Pasqual: ?Treating Ahaan Zobrist/Extender: Paula Bass ?Paula Bass ?Weeks in Treatment: 0 ?Clinic Level of Care Assessment Items ?TOOL 1 Quantity Score ?X- 1 0 ?Use when EandM and Procedure is performed on INITIAL visit ?ASSESSMENTS - Nursing Assessment / Reassessment ?X- 1 20 ?General Physical Exam (combine w/ comprehensive assessment (listed just below) when performed on new pt. evals) ?X- 1 25 ?Comprehensive Assessment (HX, ROS, Risk Assessments, Wounds Hx, etc.) ?ASSESSMENTS - Wound and Skin Assessment / Reassessment ?'[]'$  - 0 ?Dermatologic / Skin Assessment (not related to wound area) ?ASSESSMENTS - Ostomy and/or Continence Assessment and Care ?'[]'$  - 0 ?Incontinence Assessment and Management ?'[]'$  - 0 ?Ostomy Care Assessment and Management (repouching, etc.) ?PROCESS - Coordination of Care ?X - Simple Patient / Family Education for ongoing care 1 15 ?'[]'$  - 0 ?Complex (extensive) Patient / Family Education for ongoing care ?X- 1 10 ?Staff obtains Consents, Records, T Results / Process Orders ?est ?'[]'$  - 0 ?Staff telephones HHA, Nursing Homes / Clarify orders / etc ?'[]'$  - 0 ?Routine Transfer to another Facility (non-emergent condition) ?'[]'$  - 0 ?Routine Hospital Admission (non-emergent condition) ?X- 1 15 ?New Admissions / Biomedical engineer / Ordering NPWT Apligraf, etc. ?, ?'[]'$  - 0 ?Emergency Hospital Admission (emergent condition) ?PROCESS - Special Needs ?'[]'$  - 0 ?Pediatric / Minor Patient Management ?'[]'$  - 0 ?Isolation Patient Management ?'[]'$  - 0 ?Hearing / Language / Visual special needs ?'[]'$  - 0 ?Assessment of Community assistance (transportation, D/C planning, etc.) ?'[]'$  - 0 ?Additional assistance / Altered mentation ?'[]'$  - 0 ?Support Surface(s) Assessment (bed, cushion, seat, etc.) ?INTERVENTIONS - Miscellaneous ?'[]'$  - 0 ?External ear exam ?'[]'$  - 0 ?Patient Transfer (multiple  staff / Civil Service fast streamer / Similar devices) ?'[]'$  - 0 ?Simple Staple / Suture removal (25 or less) ?'[]'$  - 0 ?Complex Staple / Suture removal (26 or more) ?'[]'$  - 0 ?Hypo/Hyperglycemic Management (do not check if billed separately) ?X- 1 15 ?Ankle / Brachial Index (ABI) - do not check if billed separately ?Has the patient been seen at the hospital within  the last three years: Yes ?Total Score: 100 ?Level Of Care: New/Established - Level 3 ?Electronic Signature(s) ?Signed: 05/27/2021 4:27:25 PM By: Levan Hurst RN, BSN ?Entered By: Levan Hurst on 05/27/2021 08:56:36 ?-------------------------------------------------------------------------------- ?Encounter Discharge Information Details ?Patient Name: ?Date of Service: ?Paula Bass, [name] 05/27/2021 8:00 A M ?Medical Record Number: [id number] ?Patient Account Number: [id number] ?Date of Birth/Sex: ?Treating RN: ?1970/09/03 (51 y.o. Paula Bass ?Primary Care Lavina Resor: Paula Bass ?Other Clinician: ?Referring Emmett Bracknell: ?Treating Moesha Sarchet/Extender: Paula Bass ?Paula Bass ?Weeks in Treatment: 0 ?Encounter Discharge Information Items Post Procedure Vitals ?Discharge Condition: Stable ?Temperature (F): 97.9 ?Ambulatory Status: Ambulatory ?Pulse (bpm): 93 ?Discharge Destination: Home ?Respiratory Rate (breaths/min): 18 ?Transportation: Private Auto ?Blood Pressure (mmHg): 147/88 ?Accompanied By: alone ?Schedule Follow-up Appointment: Yes ?Clinical Summary of Care: Patient Declined ?Electronic Signature(s) ?Signed: 05/27/2021 4:27:25 PM By: Levan Hurst RN, BSN ?Entered By: Levan Hurst on 05/27/2021 10:33:26 ?-------------------------------------------------------------------------------- ?Lower Extremity Assessment Details ?Patient Name: ?Date of Service: ?Paula Bass, [name] 05/27/2021 8:00 A M ?Medical Record Number: [id number] ?Patient Account Number: [id number] ?Date of Birth/Sex: ?Treating RN: ?Feb 17, 1971 (51 y.o. Paula Bass ?Primary Care  Harper Smoker: Paula Bass ?Other Clinician: ?Referring Lian Pounds: ?Treating Davene Jobin/Extender: Paula Bass ?Paula Bass ?Weeks in Treatment: 0 ?Edema Assessment ?Assessed: [Left: No] [Right: No] ?E[Left: dema] [Right: :] ?Calf ?Left: Right: ?Point of Measurement: 31 cm From Medial Instep 31 cm ?Ankle ?Left: Right: ?Point of Measurement: 8 cm From Medial Instep 18.3 cm ?Vascular Assessment ?Pulses: ?Dorsalis Pedis ?Palpable: [Left:Yes] ?Doppler Audible: [Left:Yes] ?Posterior Tibial ?Palpable: [Left:Yes] ?Blood Pressure: ?Brachial: [Left:147] ?Ankle: ?[Left:Dorsalis Pedis: 152 1.03] ?Electronic Signature(s) ?Signed: 05/27/2021 4:27:25 PM By: Levan Hurst RN, BSN ?Entered By: Levan Hurst on 05/27/2021 08:40:33 ?-------------------------------------------------------------------------------- ?Multi Wound Chart Details ?Patient Name: ?Date of Service: ?Paula Bass, [name] 05/27/2021 8:00 A M ?Medical Record Number: [id number] ?Patient Account Number: [id number] ?Date of Birth/Sex: ?Treating RN: ?1970-11-11 (51 y.o. F) Scotton, Mechele Bass ?Primary Care Jerelene Salaam: Paula Bass ?Other Clinician: ?Referring Daune Colgate: ?Treating Rasheen Bells/Extender: Paula Bass ?Paula Bass ?Weeks in Treatment: 0 ?Vital Signs ?Height(in): 62 ?Capillary Blood Glucose(mg/dl): 168 ?Weight(lbs): 134 ?Pulse(bpm): 93 ?Body Mass Index(BMI): 24.5 ?Blood Pressure(mmHg): 147/88 ?Temperature(??F): 97.9 ?Respiratory Rate(breaths/min): 18 ?Photos: [N/A:N/A] ?Left T Great ?oe N/A N/A ?Wound Location: ?Gradually Appeared N/A N/A ?Wounding Event: ?Diabetic Wound/Ulcer of the Lower N/A N/A ?Primary Etiology: ?Extremity ?Hypertension, Type II Diabetes, End N/A N/A ?Comorbid History: ?Stage Renal Disease, Lupus ?Erythematosus, Neuropathy ?05/13/2021 N/A N/A ?Date Acquired: ?0 N/A N/A ?Weeks of Treatment: ?Open N/A N/A ?Wound Status: ?No N/A N/A ?Wound Recurrence: ?0.7x0.6x0.1 N/A N/A ?Measurements L x W x D (cm) ?0.33 N/A N/A ?A (cm?)  : ?rea ?0.033 N/A N/A ?Volume (cm?) : ?0.00% N/A N/A ?% Reduction in A rea: ?0.00% N/A N/A ?% Reduction in Volume: ?Grade 2 N/A N/A ?Classification: ?Medium N/A N/A ?Exudate A mount: ?Serosanguineous N/A N/A ?Exud

## 2021-06-03 ENCOUNTER — Encounter (HOSPITAL_BASED_OUTPATIENT_CLINIC_OR_DEPARTMENT_OTHER): Payer: Medicare HMO | Attending: General Surgery | Admitting: General Surgery

## 2021-06-03 DIAGNOSIS — E1122 Type 2 diabetes mellitus with diabetic chronic kidney disease: Secondary | ICD-10-CM | POA: Diagnosis not present

## 2021-06-03 DIAGNOSIS — M3210 Systemic lupus erythematosus, organ or system involvement unspecified: Secondary | ICD-10-CM | POA: Insufficient documentation

## 2021-06-03 DIAGNOSIS — Z7960 Long term (current) use of unspecified immunomodulators and immunosuppressants: Secondary | ICD-10-CM | POA: Diagnosis not present

## 2021-06-03 DIAGNOSIS — Z940 Kidney transplant status: Secondary | ICD-10-CM | POA: Diagnosis not present

## 2021-06-03 DIAGNOSIS — E1140 Type 2 diabetes mellitus with diabetic neuropathy, unspecified: Secondary | ICD-10-CM | POA: Diagnosis not present

## 2021-06-03 DIAGNOSIS — E11621 Type 2 diabetes mellitus with foot ulcer: Secondary | ICD-10-CM | POA: Insufficient documentation

## 2021-06-03 DIAGNOSIS — N186 End stage renal disease: Secondary | ICD-10-CM | POA: Diagnosis not present

## 2021-06-03 DIAGNOSIS — I120 Hypertensive chronic kidney disease with stage 5 chronic kidney disease or end stage renal disease: Secondary | ICD-10-CM | POA: Insufficient documentation

## 2021-06-03 DIAGNOSIS — L97522 Non-pressure chronic ulcer of other part of left foot with fat layer exposed: Secondary | ICD-10-CM | POA: Diagnosis not present

## 2021-06-10 ENCOUNTER — Encounter (HOSPITAL_BASED_OUTPATIENT_CLINIC_OR_DEPARTMENT_OTHER): Payer: Medicare HMO | Admitting: General Surgery

## 2021-06-10 DIAGNOSIS — Z940 Kidney transplant status: Secondary | ICD-10-CM | POA: Diagnosis not present

## 2021-06-10 DIAGNOSIS — I120 Hypertensive chronic kidney disease with stage 5 chronic kidney disease or end stage renal disease: Secondary | ICD-10-CM | POA: Diagnosis not present

## 2021-06-10 DIAGNOSIS — N186 End stage renal disease: Secondary | ICD-10-CM | POA: Diagnosis not present

## 2021-06-10 DIAGNOSIS — E1122 Type 2 diabetes mellitus with diabetic chronic kidney disease: Secondary | ICD-10-CM | POA: Diagnosis not present

## 2021-06-10 DIAGNOSIS — L97522 Non-pressure chronic ulcer of other part of left foot with fat layer exposed: Secondary | ICD-10-CM | POA: Diagnosis not present

## 2021-06-10 DIAGNOSIS — E11621 Type 2 diabetes mellitus with foot ulcer: Secondary | ICD-10-CM | POA: Diagnosis not present

## 2021-06-10 DIAGNOSIS — E1140 Type 2 diabetes mellitus with diabetic neuropathy, unspecified: Secondary | ICD-10-CM | POA: Diagnosis not present

## 2021-06-10 DIAGNOSIS — M3210 Systemic lupus erythematosus, organ or system involvement unspecified: Secondary | ICD-10-CM | POA: Diagnosis not present

## 2021-06-10 DIAGNOSIS — Z7960 Long term (current) use of unspecified immunomodulators and immunosuppressants: Secondary | ICD-10-CM | POA: Diagnosis not present

## 2021-06-14 ENCOUNTER — Ambulatory Visit
Admission: RE | Admit: 2021-06-14 | Discharge: 2021-06-14 | Disposition: A | Discharge status: Home / Self Care | Payer: Medicare HMO | Source: Ambulatory Visit | Attending: Internal Medicine | Admitting: Internal Medicine

## 2021-06-14 DIAGNOSIS — Z1231 Encounter for screening mammogram for malignant neoplasm of breast: Secondary | ICD-10-CM

## 2021-06-14 DIAGNOSIS — Z940 Kidney transplant status: Secondary | ICD-10-CM | POA: Diagnosis not present

## 2021-06-14 DIAGNOSIS — E108 Type 1 diabetes mellitus with unspecified complications: Secondary | ICD-10-CM | POA: Diagnosis not present

## 2021-06-14 DIAGNOSIS — Z794 Long term (current) use of insulin: Secondary | ICD-10-CM | POA: Diagnosis not present

## 2021-06-15 NOTE — Progress Notes (Signed)
SAFARI, CINQUE E. ([id number]) ?Visit Report for 06/10/2021 ?Arrival Information Details ?Patient Name: Date of Service: ?Tatar, [name] 06/10/2021 10:00 A M ?Medical Record Number: [id number] ?Patient Account Number: [id number] ?Date of Birth/Sex: Treating RN: ?07-May-1970 (51 y.o. [name], [name] ?Primary Care Jayonna Meyering: Benito Mccreedy Other Clinician: ?Referring Rio Taber: ?Treating Mikiyah Glasner/Extender: [name] ?[name], [name]: 2 ?Visit Information History Since Last Visit ?Added or deleted any medications: No ?Patient Arrived: Ambulatory ?Any new allergies or adverse reactions: No ?Arrival Time: 10:06 ?Had a fall or experienced change in No ?Accompanied By: self ?activities of daily living that may affect ?Transfer Assistance: None ?risk of falls: ?Patient Identification Verified: Yes ?Signs or symptoms of abuse/neglect since last visito No ?Secondary Verification Process Completed: Yes ?Hospitalized since last visit: No ?Patient Requires Transmission-Based Precautions: No ?Implantable device outside of the clinic excluding No ?Patient Has Alerts: No ?cellular tissue based products placed in the center ?since last visit: ?Has Dressing in Place as Prescribed: Yes ?Pain Present Now: No ?Electronic Signature(s) ?Signed: 06/10/2021 2:44:40 PM By: Sandre Kitty ?Entered By: Sandre Kitty on 06/10/2021 10:06:29 ?-------------------------------------------------------------------------------- ?Encounter Discharge Information Details ?Patient Name: Date of Service: ?Bulthuis, [name] 06/10/2021 10:00 A M ?Medical Record Number: [id number] ?Patient Account Number: [id number] ?Date of Birth/Sex: Treating RN: ?[birth year] (51 y.o. F) Sharyn Creamer ?Primary Care Ardythe Klute: Benito Mccreedy Other Clinician: ?Referring Thad Osoria: ?Treating Doni Widmer/Extender: [name] ?[name], [name]: 2 ?Encounter Discharge Information Items Post Procedure Vitals ?Discharge  Condition: Stable ?Temperature (F): 98.3 ?Ambulatory Status: Ambulatory ?Pulse (bpm): 96 ?Discharge Destination: Home ?Respiratory Rate (breaths/min): 16 ?Transportation: Private Auto ?Blood Pressure (mmHg): 132/75 ?Schedule Follow-up Appointment: Yes ?Clinical Summary of Care: Patient Declined ?Electronic Signature(s) ?Signed: 06/15/2021 4:54:39 PM By: Sharyn Creamer RN, BSN ?Entered By: Sharyn Creamer on 06/10/2021 10:55:22 ?-------------------------------------------------------------------------------- ?Lower Extremity Assessment Details ?Patient Name: ?Date of Service: ?Tirey, [name] 06/10/2021 10:00 A M ?Medical Record Number: [id number] ?Patient Account Number: [id number] ?Date of Birth/Sex: ?Treating RN: ?1970/09/09 (51 y.o. F) Boehlein, Linda ?Primary Care Rocio Roam: Benito Mccreedy ?Other Clinician: ?Referring Mckyle Solanki: ?Treating Kamran Coker/Extender: [name] ?[name], [name]: 2 ?Edema Assessment ?Assessed: [Left: No] [Right: No] ?Edema: [Left: N] [Right: o] ?Calf ?Left: Right: ?Point of Measurement: 31 cm From Medial Instep 31 cm ?Ankle ?Left: Right: ?Point of Measurement: 8 cm From Medial Instep 18.3 cm ?Vascular Assessment ?Pulses: ?Dorsalis Pedis ?Palpable: [Left:Yes] ?Electronic Signature(s) ?Signed: 06/10/2021 6:26:26 PM By: Baruch Gouty RN, BSN ?Entered By: Baruch Gouty on 06/10/2021 10:24:15 ?-------------------------------------------------------------------------------- ?Multi Wound Chart Details ?Patient Name: ?Date of Service: ?Arrey, [name] 06/10/2021 10:00 A M ?Medical Record Number: [id number] ?Patient Account Number: [id number] ?Date of Birth/Sex: ?Treating RN: ?27-Feb-1971 (51 y.o. [name], [name] ?Primary Care Jasiya Markie: Benito Mccreedy ?Other Clinician: ?Referring Arvil Utz: ?Treating Christee Mervine/Extender: [name] ?[name], [name]: 2 ?Vital Signs ?Height(in): 62 ?Capillary Blood Glucose(mg/dl): 130 ?Weight(lbs):  134 ?Pulse(bpm): 96 ?Body Mass Index(BMI): 24.5 ?Blood Pressure(mmHg): 132/75 ?Temperature(??F): 98.3 ?Respiratory Rate(breaths/min): 16 ?Photos: [1:Left, Plantar T Great oe] [N/A:N/A N/A] ?Wound Location: [1:Gradually Appeared] [N/A:N/A] ?Wounding Event: [1:Diabetic Wound/Ulcer of the Lower] [N/A:N/A] ?Primary Etiology: [1:Extremity Hypertension, Type II Diabetes, End N/A] ?Comorbid History: [1:Stage Renal Disease, Lupus Erythematosus, Neuropathy 05/13/2021] [N/A:N/A] ?Date Acquired: [1:2] [N/A:N/A] ?Weeks of Treatment: [1:Open] [N/A:N/A] ?Wound Status: [1:No] [N/A:N/A] ?Wound Recurrence: [1:0.4x0.2x0.2] [N/A:N/A] ?Measurements L x W x D (cm) [1:0.063] [N/A:N/A] ?A (cm?) : ?rea [1:0.013] [N/A:N/A] ?Volume (cm?) : [1:80.90%] [N/A:N/A] ?% Reduction in A [1:rea: 60.60%] [N/A:N/A] ?% Reduction in Volume: [1:12] ?Starting Position 1 (o'clock): [  1:12] ?Ending Position 1 (o'clock): [1:0.2] ?Maximum Distance 1 (cm): [1:Yes] [N/A:N/A] ?Undermining: [1:Grade 2] [N/A:N/A] ?Classification: [1:Small] [N/A:N/A] ?Exudate A mount: [1:Serosanguineous] [N/A:N/A] ?Exudate Type: [1:red, brown] [N/A:N/A] ?Exudate Color: [1:Flat and Intact] [N/A:N/A] ?Wound Margin: [1:Large (67-100%)] [N/A:N/A] ?Granulation A mount: [1:Red, Pink] [N/A:N/A] ?Granulation Quality: [1:Small (1-33%)] [N/A:N/A] ?Necrotic A mount: ?[1:Fat Layer (Subcutaneous Tissue): Yes N/A] ?Exposed Structures: ?[1:Fascia: No Tendon: No Muscle: No Joint: No Bone: No Small (1-33%)] [N/A:N/A] ?Epithelialization: [1:Debridement - Excisional] [N/A:N/A] ?Debridement: ?Pre-procedure Verification/Time Out 10:26 [N/A:N/A] ?Taken: [1:Other] [N/A:N/A] ?Pain Control: [1:Callus, Subcutaneous, Slough] [N/A:N/A] ?Tissue Debrided: [1:Skin/Subcutaneous Tissue] [N/A:N/A] ?Level: [1:0.14] [N/A:N/A] ?Debridement A (sq cm): [1:rea Curette] [N/A:N/A] ?Instrument: [1:Minimum] [N/A:N/A] ?Bleeding: [1:Pressure] [N/A:N/A] ?Hemostasis A chieved: [1:0] [N/A:N/A] ?Procedural Pain: [1:0] [N/A:N/A] ?Post  Procedural Pain: [1:Procedure was tolerated well] [N/A:N/A] ?Debridement Treatment Response: [1:0.7x0.2x0.2] [N/A:N/A] ?Post Debridement Measurements L x ?W x D (cm) [1:0.022] [N/A:N/A] ?Post Debridement Volume: (cm?) [1:Debridement] [N/A:N/A] ?Treatment Notes ?Electronic Signature(s) ?Signed: 06/10/2021 10:46:48 AM By: [name] MD FACS ?Signed: 06/13/2021 5:45:33 PM By: Levan Hurst RN, BSN ?Entered By: [name] on 06/10/2021 10:46:48 ?-------------------------------------------------------------------------------- ?Multi-Disciplinary Care Plan Details ?Patient Name: ?Date of Service: ?Oyama, [name] 06/10/2021 10:00 A M ?Medical Record Number: [id number] ?Patient Account Number: [id number] ?Date of Birth/Sex: ?Treating RN: ?[birth year] (51 y.o. F) Boehlein, Linda ?Primary Care Blythe Veach: Benito Mccreedy ?Other Clinician: ?Referring Allissa Albright: ?Treating Chayanne Filippi/Extender: [name] ?[name], [name]: 2 ?Multidisciplinary Care Plan reviewed with physician ?Active Inactive ?Nutrition ?Nursing Diagnoses: ?Impaired glucose control: actual or potential ?Goals: ?Patient/caregiver will maintain therapeutic glucose control ?Date Initiated: 05/27/2021 ?Target Resolution Date: 06/24/2021 ?Goal Status: Active ?Interventions: ?Assess HgA1c results as ordered upon admission and as needed ?Assess patient nutrition upon admission and as needed per policy ?Provide education on elevated blood sugars and impact on wound healing ?Notes: ?Wound/Skin Impairment ?Nursing Diagnoses: ?Impaired tissue integrity ?Knowledge deficit related to ulceration/compromised skin integrity ?Goals: ?Patient/caregiver will verbalize understanding of skin care regimen ?Date Initiated: 05/27/2021 ?Target Resolution Date: 06/24/2021 ?Goal Status: Active ?Ulcer/skin breakdown will have a volume reduction of 30% by week 4 ?Date Initiated: 05/27/2021 ?Target Resolution Date: 06/24/2021 ?Goal Status:  Active ?Interventions: ?Assess patient/caregiver ability to obtain necessary supplies ?Assess patient/caregiver ability to perform ulcer/skin care regimen upon admission and as needed ?Assess ulceration(s) every visit ?Pro

## 2021-06-15 NOTE — Progress Notes (Signed)
ALLYE, HOYOS E. ([id number]) ?Visit Report for 06/10/2021 ?Chief Complaint Document Details ?Patient Name: Date of Service: ?Paula Bass, [name] 06/10/2021 10:00 A M ?Medical Record Number: [id number] ?Patient Account Number: [id number] ?Date of Birth/Sex: Treating RN: ?01-13-1971 (51 y.o. Paula Bass, Paula Bass ?Primary Care Provider: Benito Mccreedy Other Clinician: ?Referring Provider: ?Treating Provider/Extender: [name] ?[name], [name]: 2 ?Information Obtained from: Patient ?Chief Complaint ?Patients presents for treatment of an open diabetic ulcer ?Electronic Signature(s) ?Signed: 06/10/2021 10:46:57 AM By: [name] MD FACS ?Entered By: [name] on 06/10/2021 10:46:57 ?-------------------------------------------------------------------------------- ?Debridement Details ?Patient Name: Date of Service: ?Paula Bass, [name] 06/10/2021 10:00 A M ?Medical Record Number: [id number] ?Patient Account Number: [id number] ?Date of Birth/Sex: Treating RN: ?[birth year] (51 y.o. F) Sharyn Creamer ?Primary Care Provider: Benito Mccreedy Other Clinician: ?Referring Provider: ?Treating Provider/Extender: [name] ?[name], [name]: 2 ?Debridement Performed for Assessment: Wound #1 Left,Plantar T Great ?oe ?Performed By: Physician [name], MD ?Debridement Type: Debridement ?Severity of Tissue Pre Debridement: Fat layer exposed ?Level of Consciousness (Pre-procedure): Awake and Alert ?Pre-procedure Verification/Time Out Yes - 10:26 ?Taken: ?Start Time: 10:31 ?Pain Control: ?Other : Benzocaine 20% spray ?T Area Debrided (L x W): ?otal 0.7 (cm) x 0.2 (cm) = 0.14 (cm?) ?Tissue and other material debrided: Callus, Slough, Subcutaneous, [place] ?Level: Skin/Subcutaneous Tissue ?Debridement Description: Excisional ?Instrument: Curette ?Bleeding: Minimum ?Hemostasis Achieved: Pressure ?Procedural Pain: 0 ?Post Procedural Pain: 0 ?Response to Treatment:  Procedure was tolerated well ?Level of Consciousness (Post- Awake and Alert ?procedure): ?Post Debridement Measurements of Total Wound ?Length: (cm) 0.7 ?Width: (cm) 0.2 ?Depth: (cm) 0.2 ?Volume: (cm?) 0.022 ?Character of Wound/Ulcer Post Debridement: Improved ?Severity of Tissue Post Debridement: Fat layer exposed ?Post Procedure Diagnosis ?Same as Pre-procedure ?Electronic Signature(s) ?Signed: 06/10/2021 12:55:19 PM By: [name] MD FACS ?Signed: 06/15/2021 4:54:39 PM By: Sharyn Creamer RN, BSN ?Entered By: Sharyn Creamer on 06/10/2021 10:35:24 ?-------------------------------------------------------------------------------- ?HPI Details ?Patient Name: Date of Service: ?Paula Bass, [name] 06/10/2021 10:00 A M ?Medical Record Number: [id number] ?Patient Account Number: [id number] ?Date of Birth/Sex: Treating RN: ?01-20-1971 (51 y.o. Paula Bass, Paula Bass ?Primary Care Provider: Benito Mccreedy Other Clinician: ?Referring Provider: ?Treating Provider/Extender: [name] ?[name], [name]: 2 ?History of Present Illness ?HPI Description: ADMISSION ?05/27/2021 ?This is a 51 year old woman with a past medical history notable for type 2 diabetes mellitus with neuropathy, end-stage renal disease status post renal ?transplantation in August 2022, systemic lupus erythematosus, and hypertension. She says that about a week ago, she was picking what she thought was ?dead skin off of her left great toe, but secondary to the neuropathy, she was unaware that she had opened up a fairly large hole in the toe. She saw her ?primary care provider on March 27. She was diagnosed with cellulitis and placed on a 10-day course of Keflex. She is here today for further evaluation and ?management of a nonhealing ulcer on her left great toe. She says that it sometimes drains serosanguineous fluid, but she has not noticed any pus or foul ?odor. No pain. No fevers or chills. She is on chronic immunosuppressants for  her renal transplant. ?On the plantar surface of the left great toe, there is a circular wound surrounded by callus. The base of the wound is clean without slough or other debris. Fat ?layer is exposed. ?06/03/2021: There has been some buildup of callus around the wound which has created undermining. The wound seems to be a little bit deeper and has some ?fibrinous slough at  the base. No significant drainage or odor. ?06/10/2021: She has built up a little bit of callus again. The wound is smaller and not as deep. No significant slough at the base. ?Electronic Signature(s) ?Signed: 06/10/2021 10:47:41 AM By: [name] MD FACS ?Entered By: [name] on 06/10/2021 10:47:40 ?-------------------------------------------------------------------------------- ?Physical Exam Details ?Patient Name: Date of Service: ?Paula Bass, [name] 06/10/2021 10:00 A M ?Medical Record Number: [id number] ?Patient Account Number: [id number] ?Date of Birth/Sex: Treating RN: ?1970/07/04 (51 y.o. Paula Bass, Paula Bass ?Primary Care Provider: Benito Mccreedy Other Clinician: ?Referring Provider: ?Treating Provider/Extender: [name] ?[name], [name]: 2 ?Constitutional ?. . . . No acute distress. ?Respiratory ?Normal work of breathing on room air. ?Notes ?06/10/2021: There has been some buildup of callus around the wound, but not to the extent as last week. There is minimal slough at the base and has gotten a ?bit shallower. The undermining created by the callus is still present. No odor or significant drainage. ?Electronic Signature(s) ?Signed: 06/10/2021 10:48:39 AM By: [name] MD FACS ?Entered By: [name] on 06/10/2021 10:48:39 ?-------------------------------------------------------------------------------- ?Physician Orders Details ?Patient Name: Date of Service: ?Paula Bass, [name] 06/10/2021 10:00 A M ?Medical Record Number: [id number] ?Patient Account Number: [id number] ?Date of  Birth/Sex: Treating RN: ?[birth year] (51 y.o. F) Sharyn Creamer ?Primary Care Provider: Benito Mccreedy Other Clinician: ?Referring Provider: ?Treating Provider/Extender: [name] ?[name], [name]: 2 ?Verbal / Phone Orders: No ?Diagnosis Coding ?ICD-10 Coding ?Code Description ?E08.144 Non-pressure chronic ulcer of other part of left foot with fat layer exposed ?E11.621 Type 2 diabetes mellitus with foot ulcer ?M32.10 Systemic lupus erythematosus, organ or system involvement unspecified ?Follow-up Appointments ?ppointment in 1 week. - Dr. Celine Ahr - Room 2 ?Return A ?Bathing/ Shower/ Hygiene ?May shower and wash wound with soap and water. - prior to dressing change ?Off-Loading ?Wedge shoe to: - front offloading sandal to left foot ?Wound Treatment ?Wound #1 - T Great ?oe Wound Laterality: Plantar, Left ?Cleanser: Soap and Water 1 x Per Day/15 Days ?Discharge Instructions: May shower and wash wound with dial antibacterial soap and water prior to dressing change. ?Prim Dressing: KerraCel Ag Gelling Fiber Dressing, 2x2 in (silver alginate) (Generic) 1 x Per Day/15 Days ?ary ?Discharge Instructions: Apply silver alginate to wound bed as instructed ?Secondary Dressing: Optifoam Non-Adhesive Dressing, 4x4 in (Generic) 1 x Per Day/15 Days ?Discharge Instructions: Apply foam donut over primary dressing as directed. ?Secondary Dressing: Woven Gauze Sponges 2x2 in (Generic) 1 x Per Day/15 Days ?Discharge Instructions: Apply over primary dressing as directed. ?Secured With: Child psychotherapist, Sterile 2x75 (in/in) (Generic) 1 x Per Day/15 Days ?Discharge Instructions: Secure with stretch gauze as directed. ?Secured With: 66M [employer]ipore Public affairs consultant Surgical T 2x10 (in/yd) (Generic) 1 x Per Day/15 Days ?ape ?Discharge Instructions: Secure with tape as directed. ?Electronic Signature(s) ?Signed: 06/10/2021 12:55:19 PM By: [name] MD FACS ?Entered By: [name] on 06/10/2021  10:48:58 ?-------------------------------------------------------------------------------- ?Problem List Details ?Patient Name: Date of Service: ?Paula Bass, [name] 06/10/2021 10:00 A M ?Medical Record Number:

## 2021-06-17 ENCOUNTER — Encounter (HOSPITAL_BASED_OUTPATIENT_CLINIC_OR_DEPARTMENT_OTHER): Payer: Medicare HMO | Admitting: General Surgery

## 2021-06-17 DIAGNOSIS — M3210 Systemic lupus erythematosus, organ or system involvement unspecified: Secondary | ICD-10-CM | POA: Diagnosis not present

## 2021-06-17 DIAGNOSIS — Z940 Kidney transplant status: Secondary | ICD-10-CM | POA: Diagnosis not present

## 2021-06-17 DIAGNOSIS — L97522 Non-pressure chronic ulcer of other part of left foot with fat layer exposed: Secondary | ICD-10-CM | POA: Diagnosis not present

## 2021-06-17 DIAGNOSIS — I120 Hypertensive chronic kidney disease with stage 5 chronic kidney disease or end stage renal disease: Secondary | ICD-10-CM | POA: Diagnosis not present

## 2021-06-17 DIAGNOSIS — E1140 Type 2 diabetes mellitus with diabetic neuropathy, unspecified: Secondary | ICD-10-CM | POA: Diagnosis not present

## 2021-06-17 DIAGNOSIS — E11621 Type 2 diabetes mellitus with foot ulcer: Secondary | ICD-10-CM | POA: Diagnosis not present

## 2021-06-17 DIAGNOSIS — N186 End stage renal disease: Secondary | ICD-10-CM | POA: Diagnosis not present

## 2021-06-17 DIAGNOSIS — Z7960 Long term (current) use of unspecified immunomodulators and immunosuppressants: Secondary | ICD-10-CM | POA: Diagnosis not present

## 2021-06-17 DIAGNOSIS — E1122 Type 2 diabetes mellitus with diabetic chronic kidney disease: Secondary | ICD-10-CM | POA: Diagnosis not present

## 2021-06-17 NOTE — Progress Notes (Signed)
CHIVONNE, RASCON E. ([id number]) ?Visit Report for 06/17/2021 ?Chief Complaint Document Details ?Patient Name: Date of Service: ?Belmares, [name] 06/17/2021 9:00 A M ?Medical Record Number: [id number] ?Patient Account Number: [id number] ?Date of Birth/Sex: Treating RN: ?Aug 28, 1970 (51 y.o. [name], [name] ?[name], [name]: 3 ?Information Obtained from: Patient ?Chief Complaint ?Patients presents for treatment of an open diabetic ulcer ?Electronic Signature(s) ?Signed: 06/17/2021 9:17:05 AM By: [name] MD FACS ?Entered By: [name] on 06/17/2021 09:17:05 ?-------------------------------------------------------------------------------- ?Debridement Details ?Patient Name: Date of Service: ?Madrazo, [name] 06/17/2021 9:00 A M ?Medical Record Number: [id number] ?Patient Account Number: [id number] ?Date of Birth/Sex: Treating RN: ?06-May-1970 (51 y.o. F) Deaton, Bobbi ?Primary Care Provider: Benito Mccreedy Other Clinician: ?Referring Provider: ?Treating Provider/Extender: [name] ?[name], [name]: 3 ?Debridement Performed for Assessment: Wound #1 Left,Plantar T Great ?oe ?Performed By: Physician [name], MD ?Debridement Type: Debridement ?Severity of Tissue Pre Debridement: Fat layer exposed ?Level of Consciousness (Pre-procedure): Awake and Alert ?Pre-procedure Verification/Time Out Yes - 09:10 ?Taken: ?Start Time: 09:11 ?Pain Control: Lidocaine 5% topical ointment ?T Area Debrided (L x W): ?otal 0.5 (cm) x 0.5 (cm) = 0.25 (cm?) ?Tissue and other material debrided: ?Viable, Non-Viable, Callus, Subcutaneous, Skin: Dermis , Skin: Epidermis, Fibrin/Exudate ?Level: Skin/Subcutaneous Tissue ?Debridement Description: Excisional ?Instrument: Curette ?Bleeding: Minimum ?Hemostasis Achieved: Pressure ?End Time:  09:15 ?Procedural Pain: 0 ?Post Procedural Pain: 0 ?Response to Treatment: Procedure was tolerated well ?Level of Consciousness (Post- Awake and Alert ?procedure): ?Post Debridement Measurements of Total Wound ?Length: (cm) 0.2 ?Width: (cm) 0.2 ?Depth: (cm) 0.2 ?Volume: (cm?) 0.006 ?Character of Wound/Ulcer Post Debridement: Improved ?Severity of Tissue Post Debridement: Fat layer exposed ?Post Procedure Diagnosis ?Same as Pre-procedure ?Electronic Signature(s) ?Signed: 06/17/2021 10:58:40 AM By: [name] MD FACS ?Signed: 06/17/2021 3:45:50 PM By: Deon Pilling RN, BSN ?Entered By: Deon Pilling on 06/17/2021 09:15:52 ?-------------------------------------------------------------------------------- ?HPI Details ?Patient Name: Date of Service: ?Glaeser, [name] 06/17/2021 9:00 A M ?Medical Record Number: [id number] ?Patient Account Number: [id number] ?Date of Birth/Sex: Treating RN: ?1970-11-07 (51 y.o. [name], [name] ?[name], [name]: 3 ?History of Present Illness ?HPI Description: ADMISSION ?05/27/2021 ?This is a 51 year old woman with a past medical history notable for type 2 diabetes mellitus with neuropathy, end-stage renal disease status post renal ?transplantation in August 2022, systemic lupus erythematosus, and hypertension. She says that about a week ago, she was picking what she thought was ?dead skin off of her left great toe, but secondary to the neuropathy, she was unaware that she had opened up a fairly large hole in the toe. She saw her ?primary care provider on March 27. She was diagnosed with cellulitis and placed on a 10-day course of Keflex. She is here today for further evaluation and ?management of a nonhealing ulcer on her left great toe. She says that it sometimes drains serosanguineous fluid, but she has not noticed any pus or  foul ?odor. No pain. No fevers or chills. She is on chronic immunosuppressants for her renal transplant. ?On the plantar surface of the left great toe, there is a circular wound surrounded by callus. The base of the wound is clean without slough or other debris. Fat ?layer is exposed. ?06/03/2021: There has been some buildup of callus around the wound which has created undermining. The wound seems to be a little  bit deeper and has some ?fibrinous slough at the base. No significant drainage or odor. ?06/10/2021: She has built up a little bit of callus again. The wound is smaller and not as deep. No significant slough at the base. ?06/17/2021: The wound is smaller again today, but she continues to build up callus. No significant drainage or concern for infection. ?Electronic Signature(s) ?Signed: 06/17/2021 9:17:40 AM By: [name] MD FACS ?Entered By: [name] on 06/17/2021 09:17:40 ?-------------------------------------------------------------------------------- ?Physical Exam Details ?Patient Name: Date of Service: ?Golubski, [name] 06/17/2021 9:00 A M ?Medical Record Number: [id number] ?Patient Account Number: [id number] ?Date of Birth/Sex: Treating RN: ?[birth year] (51 y.o. [name], [name] ?[name], [name]: 3 ?Constitutional ?. . . . No acute distress. ?Respiratory ?Normal work of breathing on room air. ?Notes ?06/17/2021: The wound is smaller today. There has been a small amount of callus buildup. No significant drainage and no concern for infection. ?Electronic Signature(s) ?Signed: 06/17/2021 9:18:59 AM By: [name] MD FACS ?Entered By: [name] on 06/17/2021 09:18:59 ?-------------------------------------------------------------------------------- ?Physician Orders Details ?Patient Name: ?Date of Service: ?Manus, [name] 06/17/2021  9:00 A M ?Medical Record Number: [id number] ?Patient Account Number: [id number] ?Date of Birth/Sex: ?Treating RN: ?1970/08/02 (51 y.o. F) Deaton, Bobbi ?Primary Care Provider: Benito Mccreedy ?Other Clinician: ?Referring Provider: ?Treating Provider/Extender: [name] ?[name], [name]: 3 ?Verbal / Phone Orders: No ?Diagnosis Coding ?ICD-10 Coding ?Code Description ?W96.759 Non-pressure chronic ulcer of other part of left foot with fat layer exposed ?E11.621 Type 2 diabetes mellitus with foot ulcer ?M32.10 Systemic lupus erythematosus, organ or system involvement unspecified ?Follow-up Appointments ?ppointment in 1 week. - Dr. Celine Ahr - Room 2 0915 06/24/2021 ?Return A ?Bathing/ Shower/ Hygiene ?May shower and wash wound with soap and water. - prior to dressing change ?Off-Loading ?Wedge shoe to: - front offloading sandal to left foot ?Wound Treatment ?Wound #1 - T Great ?oe Wound Laterality: Plantar, Left ?Cleanser: Soap and Water 1 x Per Day/15 Days ?Discharge Instructions: May shower and wash wound with dial antibacterial soap and water prior to dressing change. ?Prim Dressing: KerraCel Ag Gelling Fiber Dressing, 2x2 in (silver alginate) (Generic) 1 x Per Day/15 Days ?ary ?Discharge Instructions: Apply silver alginate to wound bed as instructed ?Secondary Dressing: Optifoam Non-Adhesive Dressing, 4x4 in (Generic) 1 x Per Day/15 Days ?Discharge Instructions: Apply foam donut over primary dressing as directed. ?Secondary Dressing: Woven Gauze Sponges 2x2 in (Generic) 1 x Per Day/15 Days ?Discharge Instructions: Apply over primary dressing as directed. ?Secured With: Child psychotherapist, Sterile 2x75 (in/in) (Generic) 1 x Per Day/15 Days ?Discharge Instructions: Secure with stretch gauze as directed. ?Secured With: 73M Medipore Public affairs consultant Surgical T 2x10 (in/yd) (Generic) 1 x Per Day/15 Days ?ape ?Discharge Instructions: Secure with tape as directed. ?Electronic  Signature(s) ?Signed: 06/17/2021 10:58:40 AM By: [name] MD FACS ?Entered By: [name] on 06/17/2021 09:19:29 ?-------------------------------------------------------------------------------- ?Problem List Details ?Patie

## 2021-06-20 DIAGNOSIS — N186 End stage renal disease: Secondary | ICD-10-CM | POA: Diagnosis not present

## 2021-06-20 DIAGNOSIS — I10 Essential (primary) hypertension: Secondary | ICD-10-CM | POA: Diagnosis not present

## 2021-06-20 DIAGNOSIS — G629 Polyneuropathy, unspecified: Secondary | ICD-10-CM | POA: Diagnosis not present

## 2021-06-20 DIAGNOSIS — E1122 Type 2 diabetes mellitus with diabetic chronic kidney disease: Secondary | ICD-10-CM | POA: Diagnosis not present

## 2021-06-20 DIAGNOSIS — E559 Vitamin D deficiency, unspecified: Secondary | ICD-10-CM | POA: Diagnosis not present

## 2021-06-20 DIAGNOSIS — Z794 Long term (current) use of insulin: Secondary | ICD-10-CM | POA: Diagnosis not present

## 2021-06-20 DIAGNOSIS — L03032 Cellulitis of left toe: Secondary | ICD-10-CM | POA: Diagnosis not present

## 2021-06-20 DIAGNOSIS — E663 Overweight: Secondary | ICD-10-CM | POA: Diagnosis not present

## 2021-06-20 DIAGNOSIS — E782 Mixed hyperlipidemia: Secondary | ICD-10-CM | POA: Diagnosis not present

## 2021-06-20 DIAGNOSIS — Z992 Dependence on renal dialysis: Secondary | ICD-10-CM | POA: Diagnosis not present

## 2021-06-20 DIAGNOSIS — M1990 Unspecified osteoarthritis, unspecified site: Secondary | ICD-10-CM | POA: Diagnosis not present

## 2021-06-20 NOTE — Progress Notes (Signed)
Paula Bass, Paula E. ([id number]) ?Visit Report for 06/17/2021 ?Arrival Information Details ?Patient Name: Date of Service: ?Paula Bass, [name] 06/17/2021 9:00 A M ?Medical Record Number: [id number] ?Patient Account Number: [id number] ?Date of Birth/Sex: Treating RN: ?06-Nov-1970 (51 y.o. [name], [name] ?Primary Care Arron Mcnaught: Benito Mccreedy Other Clinician: ?Referring Brenisha Tsui: ?Treating Denali Sharma/Extender: [name] ?[name], [name]: 3 ?Visit Information History Since Last Visit ?Added or deleted any medications: No ?Patient Arrived: Ambulatory ?Any new allergies or adverse reactions: No ?Arrival Time: 08:57 ?Had a fall or experienced change in No ?Accompanied By: self ?activities of daily living that may affect ?Transfer Assistance: None ?risk of falls: ?Patient Identification Verified: Yes ?Signs or symptoms of abuse/neglect since last visito No ?Secondary Verification Process Completed: Yes ?Hospitalized since last visit: No ?Patient Requires Transmission-Based Precautions: No ?Implantable device outside of the clinic excluding No ?Patient Has Alerts: No ?cellular tissue based products placed in the center ?since last visit: ?Has Dressing in Place as Prescribed: Yes ?Pain Present Now: No ?Electronic Signature(s) ?Signed: 06/20/2021 2:08:13 PM By: Sandre Kitty ?Entered By: Sandre Kitty on 06/17/2021 08:58:08 ?-------------------------------------------------------------------------------- ?Encounter Discharge Information Details ?Patient Name: Date of Service: ?Paula Bass, [name] 06/17/2021 9:00 A M ?Medical Record Number: [id number] ?Patient Account Number: [id number] ?Date of Birth/Sex: Treating RN: ?1970/03/10 (51 y.o. F) Deaton, Bobbi ?Primary Care Delcia Spitzley: Benito Mccreedy Other Clinician: ?Referring Keondre Markson: ?Treating Ulanda Tackett/Extender: [name] ?[name], [name]: 3 ?Encounter Discharge Information Items Post Procedure Vitals ?Discharge  Condition: Stable ?Temperature (F): 97.6 ?Ambulatory Status: Ambulatory ?Pulse (bpm): 86 ?Discharge Destination: Home ?Respiratory Rate (breaths/min): 16 ?Transportation: Private Auto ?Blood Pressure (mmHg): 123/84 ?Accompanied By: self ?Schedule Follow-up Appointment: Yes ?Clinical Summary of Care: ?Electronic Signature(s) ?Signed: 06/17/2021 3:45:50 PM By: Deon Pilling RN, BSN ?Entered By: Deon Pilling on 06/17/2021 09:18:20 ?-------------------------------------------------------------------------------- ?Lower Extremity Assessment Details ?Patient Name: ?Date of Service: ?Paula Bass, [name] 06/17/2021 9:00 A M ?Medical Record Number: [id number] ?Patient Account Number: [id number] ?Date of Birth/Sex: ?Treating RN: ?[birth year] (51 y.o. F) Deaton, Bobbi ?Primary Care Naturi Alarid: Benito Mccreedy ?Other Clinician: ?Referring Jomayra Novitsky: ?Treating Xenia Nile/Extender: [name] ?[name], [name]: 3 ?Edema Assessment ?Assessed: [Left: Yes] [Right: No] ?Edema: [Left: N] [Right: o] ?Calf ?Left: Right: ?Point of Measurement: 31 cm From Medial Instep 31 cm ?Ankle ?Left: Right: ?Point of Measurement: 8 cm From Medial Instep 18 cm ?Vascular Assessment ?Pulses: ?Dorsalis Pedis ?Palpable: [Left:Yes] ?Electronic Signature(s) ?Signed: 06/17/2021 3:45:50 PM By: Deon Pilling RN, BSN ?Entered By: Deon Pilling on 06/17/2021 09:08:05 ?-------------------------------------------------------------------------------- ?Multi Wound Chart Details ?Patient Name: ?Date of Service: ?Paula Bass, [name] 06/17/2021 9:00 A M ?Medical Record Number: [id number] ?Patient Account Number: [id number] ?Date of Birth/Sex: ?Treating RN: ?05/27/1970 (52 y.o. [name], [name] ?Primary Care Evamae Rowen: Benito Mccreedy ?Other Clinician: ?Referring Jowanda Heeg: ?Treating Captain Blucher/Extender: [name] ?[name], [name]: 3 ?Vital Signs ?Height(in): 62 ?Capillary Blood Glucose(mg/dl): 143 ?Weight(lbs):  134 ?Pulse(bpm): 86 ?Body Mass Index(BMI): 24.5 ?Blood Pressure(mmHg): 123/84 ?Temperature(??F): 97.9 ?Respiratory Rate(breaths/min): 16 ?Photos: [1:Left, Plantar T Great oe] [N/A:N/A N/A] ?Wound Location: [1:Gradually Appeared] [N/A:N/A] ?Wounding Event: [1:Diabetic Wound/Ulcer of the Lower] [N/A:N/A] ?Primary Etiology: [1:Extremity Hypertension, Type II Diabetes, End N/A] ?Comorbid History: [1:Stage Renal Disease, Lupus Erythematosus, Neuropathy 05/13/2021] [N/A:N/A] ?Date Acquired: [1:3] [N/A:N/A] ?Weeks of Treatment: [1:Open] [N/A:N/A] ?Wound Status: [1:No] [N/A:N/A] ?Wound Recurrence: [1:0.2x0.2x0.2] [N/A:N/A] ?Measurements L x W x D (cm) [1:0.031] [N/A:N/A] ?A (cm?) : ?rea [1:0.006] [N/A:N/A] ?Volume (cm?) : [1:90.60%] [N/A:N/A] ?% Reduction in A [1:rea: 81.80%] [N/A:N/A] ?% Reduction in Volume: [1:Grade 2] [N/A:N/A] ?Classification: [  1:Small] [N/A:N/A] ?Exudate A mount: [1:Serosanguineous] [N/A:N/A] ?Exudate Type: [1:red, brown] [N/A:N/A] ?Exudate Color: [1:Flat and Intact] [N/A:N/A] ?Wound Margin: [1:Large (67-100%)] [N/A:N/A] ?Granulation A mount: [1:Red, Pink] [N/A:N/A] ?Granulation Quality: [1:None Present (0%)] [N/A:N/A] ?Necrotic A mount: ?[1:Fat Layer (Subcutaneous Tissue): Yes N/A] ?Exposed Structures: ?[1:Fascia: No Tendon: No Muscle: No Joint: No Bone: No Large (67-100%)] [N/A:N/A] ?Epithelialization: [1:Debridement - Excisional] [N/A:N/A] ?Debridement: ?Pre-procedure Verification/Time Out 09:10 [N/A:N/A] ?Taken: [1:Lidocaine 5% topical ointment] [N/A:N/A] ?Pain Control: [1:Callus, Subcutaneous] [N/A:N/A] ?Tissue Debrided: [1:Skin/Subcutaneous Tissue] [N/A:N/A] ?Level: [1:0.25] [N/A:N/A] ?Debridement A (sq cm): [1:rea Curette] [N/A:N/A] ?Instrument: [1:Minimum] [N/A:N/A] ?Bleeding: [1:Pressure] [N/A:N/A] ?Hemostasis A chieved: [1:0] [N/A:N/A] ?Procedural Pain: [1:0] [N/A:N/A] ?Post Procedural Pain: [1:Procedure was tolerated well] [N/A:N/A] ?Debridement Treatment Response: [1:0.2x0.2x0.2]  [N/A:N/A] ?Post Debridement Measurements L x ?W x D (cm) [1:0.006] [N/A:N/A] ?Post Debridement Volume: (cm?) [1:Debridement] [N/A:N/A] ?Treatment Notes ?Electronic Signature(s) ?Signed: 06/17/2021 9:16:56 AM By: [name] MD FACS ?Signed: 06/17/2021 6:10:38 PM By: Levan Hurst RN, BSN ?Entered By: [name] on 06/17/2021 09:16:56 ?-------------------------------------------------------------------------------- ?Multi-Disciplinary Care Plan Details ?Patient Name: ?Date of Service: ?Paula Bass, [name] 06/17/2021 9:00 A M ?Medical Record Number: [id number] ?Patient Account Number: [id number] ?Date of Birth/Sex: ?Treating RN: ?Oct 29, 1970 (51 y.o. F) Deaton, Bobbi ?Primary Care Jakobee Brackins: Benito Mccreedy ?Other Clinician: ?Referring Roslin Norwood: ?Treating Damion Kant/Extender: [name] ?[name], [name]: 3 ?Multidisciplinary Care Plan reviewed with physician ?Active Inactive ?Nutrition ?Nursing Diagnoses: ?Impaired glucose control: actual or potential ?Goals: ?Patient/caregiver will maintain therapeutic glucose control ?Date Initiated: 05/27/2021 ?Target Resolution Date: 07/08/2021 ?Goal Status: Active ?Interventions: ?Assess HgA1c results as ordered upon admission and as needed ?Assess patient nutrition upon admission and as needed per policy ?Provide education on elevated blood sugars and impact on wound healing ?Notes: ?Wound/Skin Impairment ?Nursing Diagnoses: ?Impaired tissue integrity ?Knowledge deficit related to ulceration/compromised skin integrity ?Goals: ?Patient/caregiver will verbalize understanding of skin care regimen ?Date Initiated: 05/27/2021 ?Target Resolution Date: 07/15/2021 ?Goal Status: Active ?Ulcer/skin breakdown will have a volume reduction of 30% by week 4 ?Date Initiated: 05/27/2021 ?Target Resolution Date: 06/24/2021 ?Goal Status: Active ?Interventions: ?Assess patient/caregiver ability to obtain necessary supplies ?Assess patient/caregiver ability to perform  ulcer/skin care regimen upon admission and as needed ?Assess ulceration(s) every visit ?Provide education on ulcer and skin care ?Notes: ?Electronic Signature(s) ?Signed: 06/17/2021 3:45:50 PM By: Deon Pilling RN, BSN ?Enter

## 2021-06-24 ENCOUNTER — Encounter (HOSPITAL_BASED_OUTPATIENT_CLINIC_OR_DEPARTMENT_OTHER): Payer: Medicare HMO | Admitting: General Surgery

## 2021-06-24 DIAGNOSIS — Z940 Kidney transplant status: Secondary | ICD-10-CM | POA: Diagnosis not present

## 2021-06-24 DIAGNOSIS — E11621 Type 2 diabetes mellitus with foot ulcer: Secondary | ICD-10-CM | POA: Diagnosis not present

## 2021-06-24 DIAGNOSIS — N186 End stage renal disease: Secondary | ICD-10-CM | POA: Diagnosis not present

## 2021-06-24 DIAGNOSIS — E1140 Type 2 diabetes mellitus with diabetic neuropathy, unspecified: Secondary | ICD-10-CM | POA: Diagnosis not present

## 2021-06-24 DIAGNOSIS — Z7960 Long term (current) use of unspecified immunomodulators and immunosuppressants: Secondary | ICD-10-CM | POA: Diagnosis not present

## 2021-06-24 DIAGNOSIS — I120 Hypertensive chronic kidney disease with stage 5 chronic kidney disease or end stage renal disease: Secondary | ICD-10-CM | POA: Diagnosis not present

## 2021-06-24 DIAGNOSIS — L97522 Non-pressure chronic ulcer of other part of left foot with fat layer exposed: Secondary | ICD-10-CM | POA: Diagnosis not present

## 2021-06-24 DIAGNOSIS — E1122 Type 2 diabetes mellitus with diabetic chronic kidney disease: Secondary | ICD-10-CM | POA: Diagnosis not present

## 2021-06-24 DIAGNOSIS — M3210 Systemic lupus erythematosus, organ or system involvement unspecified: Secondary | ICD-10-CM | POA: Diagnosis not present

## 2021-06-27 NOTE — Progress Notes (Signed)
Paula Bass, Paula E. ([id number]) ?Visit Report for 06/24/2021 ?Arrival Information Details ?Patient Name: Date of Service: ?Sheehy, [name] 06/24/2021 9:15 A M ?Medical Record Number: [id number] ?Patient Account Number: [id number] ?Date of Birth/Sex: Treating RN: ?1970/07/16 (51 y.o. Paula Bass ?Primary Care Ludger Bones: Benito Mccreedy Other Clinician: ?Referring Senora Lacson: ?Treating Mccade Sullenberger/Extender: [name] ?[name], [name]: 4 ?Visit Information History Since Last Visit ?Added or deleted any medications: No ?Patient Arrived: Ambulatory ?Any new allergies or adverse reactions: No ?Arrival Time: 09:04 ?Had a fall or experienced change in No ?Accompanied By: self ?activities of daily living that may affect ?Transfer Assistance: None ?risk of falls: ?Patient Identification Verified: Yes ?Signs or symptoms of abuse/neglect since last visito No ?Secondary Verification Process Completed: Yes ?Hospitalized since last visit: No ?Patient Requires Transmission-Based Precautions: No ?Implantable device outside of the clinic excluding No ?Patient Has Alerts: No ?cellular tissue based products placed in the center ?since last visit: ?Has Dressing in Place as Prescribed: Yes ?Pain Present Now: No ?Electronic Signature(s) ?Signed: 06/24/2021 9:12:08 AM By: Sandre Kitty ?Entered By: Sandre Kitty on 06/24/2021 09:04:37 ?-------------------------------------------------------------------------------- ?Encounter Discharge Information Details ?Patient Name: Date of Service: ?Paula Bass, [name] 06/24/2021 9:15 A M ?Medical Record Number: [id number] ?Patient Account Number: [id number] ?Date of Birth/Sex: Treating RN: ?[birth year] (51 y.o. Paula Bass ?Primary Care Hernandez Losasso: Benito Mccreedy Other Clinician: ?Referring Arleatha Philipps: ?Treating Jackquline Branca/Extender: [name] ?[name], [name]: 4 ?Encounter Discharge Information Items Post Procedure Vitals ?Discharge  Condition: Stable ?Temperature (F): 97.9 ?Ambulatory Status: Ambulatory ?Pulse (bpm): 98 ?Discharge Destination: Home ?Respiratory Rate (breaths/min): 16 ?Transportation: Private Auto ?Blood Pressure (mmHg): 129/90 ?Accompanied By: alone ?Schedule Follow-up Appointment: Yes ?Clinical Summary of Care: Patient Declined ?Electronic Signature(s) ?Signed: 06/27/2021 5:50:11 PM By: Levan Hurst RN, BSN ?Entered By: Levan Hurst on 06/24/2021 10:04:41 ?-------------------------------------------------------------------------------- ?Lower Extremity Assessment Details ?Patient Name: ?Date of Service: ?Coolman, [name] 06/24/2021 9:15 A M ?Medical Record Number: [id number] ?Patient Account Number: [id number] ?Date of Birth/Sex: ?Treating RN: ?1970/06/01 (51 y.o. Paula Bass ?Primary Care Simya Tercero: Benito Mccreedy ?Other Clinician: ?Referring Dagmawi Venable: ?Treating Chrissa Meetze/Extender: [name] ?[name], [name]: 4 ?Edema Assessment ?Assessed: [Left: No] [Right: No] ?Edema: [Left: N] [Right: o] ?Calf ?Left: Right: ?Point of Measurement: 31 cm From Medial Instep 31 cm ?Ankle ?Left: Right: ?Point of Measurement: 8 cm From Medial Instep 18 cm ?Vascular Assessment ?Pulses: ?Dorsalis Pedis ?Palpable: [Left:Yes] ?Electronic Signature(s) ?Signed: 06/27/2021 5:50:11 PM By: Levan Hurst RN, BSN ?Entered By: Levan Hurst on 06/24/2021 09:14:39 ?-------------------------------------------------------------------------------- ?Multi Wound Chart Details ?Patient Name: ?Date of Service: ?Paula Bass, [name] 06/24/2021 9:15 A M ?Medical Record Number: [id number] ?Patient Account Number: [id number] ?Date of Birth/Sex: ?Treating RN: ?Dec 09, 1970 (51 y.o. Paula Bass ?Primary Care Remington Highbaugh: Benito Mccreedy ?Other Clinician: ?Referring Arianny Pun: ?Treating Sante Biedermann/Extender: [name] ?[name], [name]: 4 ?Vital Signs ?Height(in): 62 ?Pulse(bpm): 98 ?Weight(lbs): 134 ?Blood  Pressure(mmHg): 129/90 ?Body Mass Index(BMI): 24.5 ?Temperature(??F): 97.9 ?Respiratory Rate(breaths/min): 16 ?Photos: [1:Left, Plantar T Great oe] [N/A:N/A N/A] ?Wound Location: [1:Gradually Appeared] [N/A:N/A] ?Wounding Event: [1:Diabetic Wound/Ulcer of the Lower] [N/A:N/A] ?Primary Etiology: [1:Extremity Hypertension, Type II Diabetes, End N/A] ?Comorbid History: [1:Stage Renal Disease, Lupus Erythematosus, Neuropathy 05/13/2021] [N/A:N/A] ?Date Acquired: [1:4] [N/A:N/A] ?Weeks of Treatment: [1:Open] [N/A:N/A] ?Wound Status: [1:No] [N/A:N/A] ?Wound Recurrence: [1:0.1x0.1x0.1] [N/A:N/A] ?Measurements L x W x D (cm) [1:0.008] [N/A:N/A] ?A (cm?) : ?rea [1:0.001] [N/A:N/A] ?Volume (cm?) : [1:97.60%] [N/A:N/A] ?% Reduction in A [1:rea: 97.00%] [N/A:N/A] ?% Reduction in Volume: [1:Grade 2] [N/A:N/A] ?Classification: [1:Small] [N/A:N/A] ?  Exudate A mount: [1:Serosanguineous] [N/A:N/A] ?Exudate Type: [1:red, brown] [N/A:N/A] ?Exudate Color: [1:Flat and Intact] [N/A:N/A] ?Wound Margin: [1:Large (67-100%)] [N/A:N/A] ?Granulation A mount: [1:Red, Pink] [N/A:N/A] ?Granulation Quality: [1:None Present (0%)] [N/A:N/A] ?Necrotic A mount: ?[1:Fat Layer (Subcutaneous Tissue): Yes N/A] ?Exposed Structures: ?[1:Fascia: No Tendon: No Muscle: No Joint: No Bone: No Large (67-100%)] [N/A:N/A] ?Epithelialization: [1:Debridement - Selective/Open Wound N/A] ?Debridement: ?Pre-procedure Verification/Time Out 09:18 [N/A:N/A] ?Taken: [1:Callus] [N/A:N/A] ?Tissue Debrided: [1:Skin/Epidermis] [N/A:N/A] ?Level: [1:0.09] [N/A:N/A] ?Debridement A (sq cm): [1:rea Curette] [N/A:N/A] ?Instrument: [1:Minimum] [N/A:N/A] ?Bleeding: [1:Pressure] [N/A:N/A] ?Hemostasis A chieved: [1:0] [N/A:N/A] ?Procedural Pain: [1:0] [N/A:N/A] ?Post Procedural Pain: [1:Procedure was tolerated well] [N/A:N/A] ?Debridement Treatment Response: [1:0.1x0.1x0.1] [N/A:N/A] ?Post Debridement Measurements L x ?W x D (cm) [1:0.001] [N/A:N/A] ?Post Debridement Volume: (cm?)  [1:Debridement] [N/A:N/A] ?Treatment Notes ?Electronic Signature(s) ?Signed: 06/24/2021 9:28:15 AM By: [name] MD FACS ?Signed: 06/27/2021 5:50:11 PM By: Levan Hurst RN, BSN ?Entered By: [name] on 06/24/2021 09:28:15 ?-------------------------------------------------------------------------------- ?Multi-Disciplinary Care Plan Details ?Patient Name: ?Date of Service: ?Paula Bass, [name] 06/24/2021 9:15 A M ?Medical Record Number: [id number] ?Patient Account Number: [id number] ?Date of Birth/Sex: ?Treating RN: ?1970-06-28 (51 y.o. Paula Bass ?Primary Care Madison Albea: Benito Mccreedy ?Other Clinician: ?Referring Nadiah Corbit: ?Treating Jelesa Mangini/Extender: [name] ?[name], [name]: 4 ?Multidisciplinary Care Plan reviewed with physician ?Active Inactive ?Nutrition ?Nursing Diagnoses: ?Impaired glucose control: actual or potential ?Goals: ?Patient/caregiver will maintain therapeutic glucose control ?Date Initiated: 05/27/2021 ?Target Resolution Date: 07/15/2021 ?Goal Status: Active ?Interventions: ?Assess HgA1c results as ordered upon admission and as needed ?Assess patient nutrition upon admission and as needed per policy ?Provide education on elevated blood sugars and impact on wound healing ?Notes: ?Wound/Skin Impairment ?Nursing Diagnoses: ?Impaired tissue integrity ?Knowledge deficit related to ulceration/compromised skin integrity ?Goals: ?Patient/caregiver will verbalize understanding of skin care regimen ?Date Initiated: 05/27/2021 ?Target Resolution Date: 07/15/2021 ?Goal Status: Active ?Ulcer/skin breakdown will have a volume reduction of 30% by week 4 ?Date Initiated: 05/27/2021 ?Date Inactivated: 06/24/2021 ?Target Resolution Date: 06/24/2021 ?Goal Status: Met ?Interventions: ?Assess patient/caregiver ability to obtain necessary supplies ?Assess patient/caregiver ability to perform ulcer/skin care regimen upon admission and as needed ?Assess ulceration(s) every  visit ?Provide education on ulcer and skin care ?Notes: ?Electronic Signature(s) ?Signed: 06/27/2021 5:50:11 PM By: Levan Hurst RN, BSN ?Entered By: Levan Hurst on 06/24/2021 09:20:55 ?-----------------------

## 2021-06-27 NOTE — Progress Notes (Signed)
Paula Bass, Paula E. ([id number]) ?Visit Report for 06/24/2021 ?Chief Complaint Document Details ?Patient Name: Date of Service: ?Paula Bass, [name] 06/24/2021 9:15 A M ?Medical Record Number: [id number] ?Patient Account Number: [id number] ?Date of Birth/Sex: Treating RN: ?1970/06/23 (51 y.o. Paula Bass, Paula Bass ?Primary Care Provider: Benito Bass Other Clinician: ?Referring Provider: ?Treating Provider/Extender: Paula Bass ?Paula Bass, Paula Bass ?Weeks in Treatment: 4 ?Information Obtained from: Patient ?Chief Complaint ?Patients presents for treatment of an open diabetic ulcer ?Electronic Signature(s) ?Signed: 06/24/2021 9:28:22 AM By: Paula Maudlin MD FACS ?Entered By: Paula Bass on 06/24/2021 34:28:76 ?-------------------------------------------------------------------------------- ?Debridement Details ?Patient Name: Date of Service: ?Paula Bass, [name] 06/24/2021 9:15 A M ?Medical Record Number: [id number] ?Patient Account Number: [id number] ?Date of Birth/Sex: Treating RN: ?27-Aug-1970 (51 y.o. Paula Bass, Paula Bass ?Primary Care Provider: Benito Bass Other Clinician: ?Referring Provider: ?Treating Provider/Extender: Paula Bass ?Paula Bass, Paula Bass ?Weeks in Treatment: 4 ?Debridement Performed for Assessment: Wound #1 Left,Plantar T Great ?oe ?Performed By: Physician Paula Maudlin, MD ?Debridement Type: Debridement ?Severity of Tissue Pre Debridement: Fat layer exposed ?Level of Consciousness (Pre-procedure): Awake and Alert ?Pre-procedure Verification/Time Out Yes - 09:18 ?Taken: ?Start Time: 09:18 ?T Area Debrided (L x W): ?otal 0.3 (cm) x 0.3 (cm) = 0.09 (cm?) ?Tissue and other material debrided: ?Non-Viable, Callus, Skin: Epidermis ?Level: Skin/Epidermis ?Debridement Description: Selective/Open Wound ?Instrument: Curette ?Bleeding: Minimum ?Hemostasis Achieved: Pressure ?End Time: 09:19 ?Procedural Pain: 0 ?Post Procedural Pain: 0 ?Response to Treatment: Procedure was tolerated  well ?Level of Consciousness (Post- Awake and Alert ?procedure): ?Post Debridement Measurements of Total Wound ?Length: (cm) 0.1 ?Width: (cm) 0.1 ?Depth: (cm) 0.1 ?Volume: (cm?) 0.001 ?Character of Wound/Ulcer Post Debridement: Improved ?Severity of Tissue Post Debridement: Fat layer exposed ?Post Procedure Diagnosis ?Same as Pre-procedure ?Electronic Signature(s) ?Signed: 06/24/2021 12:24:03 PM By: Paula Maudlin MD FACS ?Signed: 06/27/2021 5:50:11 PM By: Levan Hurst RN, BSN ?Entered By: Levan Hurst on 06/24/2021 09:19:19 ?-------------------------------------------------------------------------------- ?HPI Details ?Patient Name: Date of Service: ?Paula Bass, [name] 06/24/2021 9:15 A M ?Medical Record Number: [id number] ?Patient Account Number: [id number] ?Date of Birth/Sex: Treating RN: ?1970/05/03 (51 y.o. Paula Bass, Paula Bass ?Primary Care Provider: Benito Bass Other Clinician: ?Referring Provider: ?Treating Provider/Extender: Paula Bass ?Paula Bass, Paula Bass ?Weeks in Treatment: 4 ?History of Present Illness ?HPI Description: ADMISSION ?05/27/2021 ?This is a 51 year old woman with a past medical history notable for type 2 diabetes mellitus with neuropathy, end-stage renal disease status post renal ?transplantation in August 2022, systemic lupus erythematosus, and hypertension. She says that about a week ago, she was picking what she thought was ?dead skin off of her left great toe, but secondary to the neuropathy, she was unaware that she had opened up a fairly large hole in the toe. She saw her ?primary care provider on March 27. She was diagnosed with cellulitis and placed on a 10-day course of Keflex. She is here today for further evaluation and ?management of a nonhealing ulcer on her left great toe. She says that it sometimes drains serosanguineous fluid, but she has not noticed any pus or foul ?odor. No pain. No fevers or chills. She is on chronic immunosuppressants for her renal transplant. ?On  the plantar surface of the left great toe, there is a circular wound surrounded by callus. The base of the wound is clean without slough or other debris. Fat ?layer is exposed. ?06/03/2021: There has been some buildup of callus around the wound which has created undermining. The wound seems to be a little bit deeper and has some ?fibrinous slough at the base. No significant  drainage or odor. ?06/10/2021: She has built up a little bit of callus again. The wound is smaller and not as deep. No significant slough at the base. ?06/17/2021: The wound is smaller again today, but she continues to build up callus. No significant drainage or concern for infection. ?06/24/2021: The wound is tiny today, just a very small, roughly 1 mm opening. There is some callus buildup. No concern for infection. ?Electronic Signature(s) ?Signed: 06/24/2021 9:29:42 AM By: Paula Maudlin MD FACS ?Entered By: Paula Bass on 06/24/2021 09:29:42 ?-------------------------------------------------------------------------------- ?Physical Exam Details ?Patient Name: Date of Service: ?Paula Bass, [name] 06/24/2021 9:15 A M ?Medical Record Number: [id number] ?Patient Account Number: [id number] ?Date of Birth/Sex: Treating RN: ?[birth year] (51 y.o. Paula Bass, Paula Bass ?Primary Care Provider: Benito Bass Other Clinician: ?Referring Provider: ?Treating Provider/Extender: Paula Bass ?Paula Bass, Paula Bass ?Weeks in Treatment: 4 ?Constitutional ?. . . . No acute distress. ?Respiratory ?Normal work of breathing on room air. ?Notes ?06/24/2021: The wound is down to just about 1 mm of opening. She continues to accumulate callus. The open surface has a healthy granulating layer. ?Electronic Signature(s) ?Signed: 06/24/2021 9:30:34 AM By: Paula Maudlin MD FACS ?Entered By: Paula Bass on 06/24/2021 09:30:33 ?-------------------------------------------------------------------------------- ?Physician Orders Details ?Patient Name: ?Date of  Service: ?Paula Bass, [name] 06/24/2021 9:15 A M ?Medical Record Number: [id number] ?Patient Account Number: [id number] ?Date of Birth/Sex: ?Treating RN: ?Aug 10, 1970 (51 y.o. Paula Bass, Paula Bass ?Primary Care Provider: Benito Bass ?Other Clinician: ?Referring Provider: ?Treating Provider/Extender: Paula Bass ?Paula Bass, Paula Bass ?Weeks in Treatment: 4 ?Verbal / Phone Orders: No ?Diagnosis Coding ?ICD-10 Coding ?Code Description ?L89.373 Non-pressure chronic ulcer of other part of left foot with fat layer exposed ?E11.621 Type 2 diabetes mellitus with foot ulcer ?M32.10 Systemic lupus erythematosus, organ or system involvement unspecified ?Follow-up Appointments ?ppointment in 1 week. - Dr. Celine Ahr - Room 2 - Friday 5/5 at 9:15 ?Return A ?Bathing/ Shower/ Hygiene ?May shower and wash wound with soap and water. - prior to dressing change ?Off-Loading ?Wedge shoe to: - front offloading sandal to left foot ?Wound Treatment ?Wound #1 - T Great ?oe Wound Laterality: Plantar, Left ?Cleanser: Soap and Water 1 x Per Day/15 Days ?Discharge Instructions: May shower and wash wound with dial antibacterial soap and water prior to dressing change. ?Prim Dressing: KerraCel Ag Gelling Fiber Dressing, 2x2 in (silver alginate) (Generic) 1 x Per Day/15 Days ?ary ?Discharge Instructions: Apply silver alginate to wound bed as instructed ?Secondary Dressing: Optifoam Non-Adhesive Dressing, 4x4 in (Generic) 1 x Per Day/15 Days ?Discharge Instructions: Apply foam donut over primary dressing as directed. ?Secondary Dressing: Woven Gauze Sponges 2x2 in (DME) (Generic) 1 x Per Day/15 Days ?Discharge Instructions: Apply over primary dressing as directed. ?Secured With: Child psychotherapist, Sterile 2x75 (in/in) (DME) (Generic) 1 x Per Day/15 Days ?Discharge Instructions: Secure with stretch gauze as directed. ?Secured With: 33M Medipore Public affairs consultant Surgical T 2x10 (in/yd) (Generic) 1 x Per Day/15 Days ?ape ?Discharge  Instructions: Secure with tape as directed. ?Electronic Signature(s) ?Signed: 06/24/2021 12:24:03 PM By: Paula Maudlin MD FACS ?Entered By: Paula Bass on 06/24/2021 09:30:42 ?------------------------------------------------

## 2021-07-01 ENCOUNTER — Encounter (HOSPITAL_BASED_OUTPATIENT_CLINIC_OR_DEPARTMENT_OTHER): Payer: Medicare HMO | Attending: General Surgery | Admitting: General Surgery

## 2021-07-01 DIAGNOSIS — E1140 Type 2 diabetes mellitus with diabetic neuropathy, unspecified: Secondary | ICD-10-CM | POA: Insufficient documentation

## 2021-07-01 DIAGNOSIS — L97522 Non-pressure chronic ulcer of other part of left foot with fat layer exposed: Secondary | ICD-10-CM | POA: Diagnosis not present

## 2021-07-01 DIAGNOSIS — E1122 Type 2 diabetes mellitus with diabetic chronic kidney disease: Secondary | ICD-10-CM | POA: Diagnosis not present

## 2021-07-01 DIAGNOSIS — E11621 Type 2 diabetes mellitus with foot ulcer: Secondary | ICD-10-CM | POA: Insufficient documentation

## 2021-07-01 DIAGNOSIS — Z7960 Long term (current) use of unspecified immunomodulators and immunosuppressants: Secondary | ICD-10-CM | POA: Insufficient documentation

## 2021-07-01 DIAGNOSIS — N186 End stage renal disease: Secondary | ICD-10-CM | POA: Insufficient documentation

## 2021-07-01 DIAGNOSIS — M3210 Systemic lupus erythematosus, organ or system involvement unspecified: Secondary | ICD-10-CM | POA: Insufficient documentation

## 2021-07-01 DIAGNOSIS — Z940 Kidney transplant status: Secondary | ICD-10-CM | POA: Diagnosis not present

## 2021-07-01 NOTE — Progress Notes (Addendum)
Paula Bass, Paula E. ([id number]) ?Visit Report for 07/01/2021 ?Chief Complaint Document Details ?Patient Name: Date of Service: ?Paula Bass, [name] 07/01/2021 9:15 A M ?Medical Record Number: [id number] ?Patient Account Number: [id number] ?Date of Birth/Sex: Treating RN: ?26-Jun-1970 (51 y.o. Paula Bass ?Primary Care Provider: Benito Bass Other Clinician: ?Referring Provider: ?Treating Provider/Extender: Paula Bass ?Paula Bass, Paula Bass ?Weeks in Treatment: 5 ?Information Obtained from: Patient ?Chief Complaint ?Patients presents for treatment of an open diabetic ulcer ?Electronic Signature(s) ?Signed: 07/01/2021 9:46:12 AM By: Paula Maudlin MD FACS ?Entered By: Paula Bass on 07/01/2021 09:46:12 ?-------------------------------------------------------------------------------- ?HPI Details ?Patient Name: Date of Service: ?Paula Bass, [name] 07/01/2021 9:15 A M ?Medical Record Number: [id number] ?Patient Account Number: [id number] ?Date of Birth/Sex: Treating RN: ?27-Jan-1971 (51 y.o. Paula Bass ?Primary Care Provider: Benito Bass Other Clinician: ?Referring Provider: ?Treating Provider/Extender: Paula Bass ?Paula Bass, Paula Bass ?Weeks in Treatment: 5 ?History of Present Illness ?HPI Description: ADMISSION ?05/27/2021 ?This is a 51 year old woman with a past medical history notable for type 2 diabetes mellitus with neuropathy, end-stage renal disease status post renal ?transplantation in August 2022, systemic lupus erythematosus, and hypertension. She says that about a week ago, she was picking what she thought was ?dead skin off of her left great toe, but secondary to the neuropathy, she was unaware that she had opened up a fairly large hole in the toe. She saw her ?primary care provider on March 27. She was diagnosed with cellulitis and placed on a 10-day course of Keflex. She is here today for further evaluation and ?management of a nonhealing ulcer on her left great toe. She says that  it sometimes drains serosanguineous fluid, but she has not noticed any pus or foul ?odor. No pain. No fevers or chills. She is on chronic immunosuppressants for her renal transplant. ?On the plantar surface of the left great toe, there is a circular wound surrounded by callus. The base of the wound is clean without slough or other debris. Fat ?layer is exposed. ?06/03/2021: There has been some buildup of callus around the wound which has created undermining. The wound seems to be a little bit deeper and has some ?fibrinous slough at the base. No significant drainage or odor. ?06/10/2021: She has built up a little bit of callus again. The wound is smaller and not as deep. No significant slough at the base. ?06/17/2021: The wound is smaller again today, but she continues to build up callus. No significant drainage or concern for infection. ?06/24/2021: The wound is tiny today, just a very small, roughly 1 mm opening. There is some callus buildup. No concern for infection. ?07/01/2021: Her wound is closed. ?Electronic Signature(s) ?Signed: 07/01/2021 9:46:59 AM By: Paula Maudlin MD FACS ?Entered By: Paula Bass on 07/01/2021 09:46:59 ?-------------------------------------------------------------------------------- ?Physical Exam Details ?Patient Name: Date of Service: ?Paula Bass, [name] 07/01/2021 9:15 A M ?Medical Record Number: [id number] ?Patient Account Number: [id number] ?Date of Birth/Sex: Treating RN: ?March 21, 1970 (51 y.o. Paula Bass ?Primary Care Provider: Benito Bass Other Clinician: ?Referring Provider: ?Treating Provider/Extender: Paula Bass ?Paula Bass, Paula Bass ?Weeks in Treatment: 5 ?Constitutional ?. . . . No acute distress. ?Respiratory ?Normal work of breathing on room air. ?Notes ?07/01/2021: Her wound is closed. ?Electronic Signature(s) ?Signed: 07/01/2021 9:47:40 AM By: Paula Maudlin MD FACS ?Entered By: Paula Bass on 07/01/2021  09:47:40 ?-------------------------------------------------------------------------------- ?Physician Orders Details ?Patient Name: Date of Service: ?Paula Bass, [name] 07/01/2021 9:15 A M ?Medical Record Number: [id number] ?Patient Account Number: [id number] ?Date of Birth/Sex: Treating RN: ?01-19-1971 (51 y.o. F) Bass,  Paula Claude ?Primary Care Provider: Benito Bass Other Clinician: ?Referring Provider: ?Treating Provider/Extender: Paula Bass ?Paula Bass, Paula Bass ?Weeks in Treatment: 5 ?Verbal / Phone Orders: No ?Diagnosis Coding ?ICD-10 Coding ?Code Description ?K55.374 Non-pressure chronic ulcer of other part of left foot with fat layer exposed ?E11.621 Type 2 diabetes mellitus with foot ulcer ?M32.10 Systemic lupus erythematosus, organ or system involvement unspecified ?Discharge From [facility] Services ?Discharge from [place] your wound is healed! ?Off-Loading ?Other: - Please use a callus pad for your Right toe ?Electronic Signature(s) ?Signed: 07/01/2021 9:48:32 AM By: Paula Maudlin MD FACS ?Entered By: Paula Bass on 07/01/2021 09:48:32 ?-------------------------------------------------------------------------------- ?Problem List Details ?Patient Name: ?Date of Service: ?Paula Bass, [name] 07/01/2021 9:15 A M ?Medical Record Number: [id number] ?Patient Account Number: [id number] ?Date of Birth/Sex: ?Treating RN: ?Apr 20, 1970 (51 y.o. Paula Bass ?Primary Care Provider: Benito Bass ?Other Clinician: ?Referring Provider: ?Treating Provider/Extender: Paula Bass ?Paula Bass, Paula Bass ?Weeks in Treatment: 5 ?Active Problems ?ICD-10 ?Encounter ?Code Description Active Date MDM ?Diagnosis ?Q49.201 Non-pressure chronic ulcer of other part of left foot with fat layer exposed 05/27/2021 No Yes ?E11.621 Type 2 diabetes mellitus with foot ulcer 05/27/2021 No Yes ?M32.10 Systemic lupus erythematosus, organ or system involvement unspecified 05/27/2021 No Yes ?Inactive  Problems ?Resolved Problems ?Electronic Signature(s) ?Signed: 07/01/2021 9:45:45 AM By: Paula Maudlin MD FACS ?Entered By: Paula Bass on 07/01/2021 09:45:45 ?-------------------------------------------------------------------------------- ?Progress Note Details ?Patient Name: ?Date of Service: ?Paula Bass, [name] 07/01/2021 9:15 A M ?Medical Record Number: [id number] ?Patient Account Number: [id number] ?Date of Birth/Sex: ?Treating RN: ?28-Dec-1970 (51 y.o. Paula Bass ?Primary Care Provider: Benito Bass ?Other Clinician: ?Referring Provider: ?Treating Provider/Extender: Paula Bass ?Paula Bass, Paula Bass ?Weeks in Treatment: 5 ?Subjective ?Chief Complaint ?Information obtained from Patient ?Patients presents for treatment of an open diabetic ulcer ?History of Present Illness (HPI) ?ADMISSION ?05/27/2021 ?This is a 51 year old woman with a past medical history notable for type 2 diabetes mellitus with neuropathy, end-stage renal disease status post renal ?transplantation in August 2022, systemic lupus erythematosus, and hypertension. She says that about a week ago, she was picking what she thought was ?dead skin off of her left great toe, but secondary to the neuropathy, she was unaware that she had opened up a fairly large hole in the toe. She saw her ?primary care provider on March 27. She was diagnosed with cellulitis and placed on a 10-day course of Keflex. She is here today for further evaluation and ?management of a nonhealing ulcer on her left great toe. She says that it sometimes drains serosanguineous fluid, but she has not noticed any pus or foul ?odor. No pain. No fevers or chills. She is on chronic immunosuppressants for her renal transplant. ?On the plantar surface of the left great toe, there is a circular wound surrounded by callus. The base of the wound is clean without slough or other debris. Fat ?layer is exposed. ?06/03/2021: There has been some buildup of callus around the wound  which has created undermining. The wound seems to be a little bit deeper and has some ?fibrinous slough at the base. No significant drainage or odor. ?06/10/2021: She has built up a little bit of callus again. The wound is smaller and not as deep. No significant sloug

## 2021-07-01 NOTE — Progress Notes (Signed)
[name], [name] ([id number]) ?Visit Report for 07/01/2021 ?Arrival Information Details ?Patient Name: Date of Service: ?[name], [name] 07/01/2021 9:15 A M ?Medical Record Number: [id number] ?Patient Account Number: [id number] ?Date of Birth/Sex: Treating RN: ?01/29/1971 (51 y.o. F) Scotton, [name] ?Primary Care Jaking Thayer: Benito Mccreedy Other Clinician: ?Referring Gerrett Loman: ?Treating Morris Longenecker/Extender: [name] ?[name], [name]: 5 ?Visit Information History Since Last Visit ?Added or deleted any medications: No ?Patient Arrived: Ambulatory ?Any new allergies or adverse reactions: No ?Arrival Time: 09:23 ?Had a fall or experienced change in No ?Accompanied By: self ?activities of daily living that may affect ?Transfer Assistance: None ?risk of falls: ?Patient Identification Verified: Yes ?Signs or symptoms of abuse/neglect since last visito No ?Patient Requires Transmission-Based Precautions: No ?Hospitalized since last visit: No ?Patient Has Alerts: No ?Implantable device outside of the clinic excluding No ?cellular tissue based products placed in the center ?since last visit: ?Has Dressing in Place as Prescribed: Yes ?Pain Present Now: Yes ?Electronic Signature(s) ?Signed: 07/01/2021 3:20:29 PM By: Dellie Catholic RN ?Entered By: Dellie Catholic on 07/01/2021 09:24:08 ?-------------------------------------------------------------------------------- ?Clinic Level of Care Assessment Details ?Patient Name: Date of Service: ?Paula Bass, [name] 07/01/2021 9:15 A M ?Medical Record Number: [id number] ?Patient Account Number: [id number] ?Date of Birth/Sex: Treating RN: ?Feb 06, 1971 (51 y.o. F) Scotton, [name] ?Primary Care Neeva Trew: Benito Mccreedy Other Clinician: ?Referring Zanylah Hardie: ?Treating Flay Ghosh/Extender: [name] ?[name], [name]: 5 ?Clinic Level of Care Assessment Items ?TOOL 4 Quantity Score ?X- 1 0 ?Use when only an EandM is performed on FOLLOW-UP  visit ?ASSESSMENTS - Nursing Assessment / Reassessment ?X- 1 10 ?Reassessment of Co-morbidities (includes updates in patient status) ?X- 1 5 ?Reassessment of Adherence to Treatment Plan ?ASSESSMENTS - Wound and Skin A ssessment / Reassessment ?X - Simple Wound Assessment / Reassessment - one wound 1 5 ?'[]'$  - 0 ?Complex Wound Assessment / Reassessment - multiple wounds ?'[]'$  - 0 ?Dermatologic / Skin Assessment (not related to wound area) ?ASSESSMENTS - Focused Assessment ?'[]'$  - 0 ?Circumferential Edema Measurements - multi extremities ?'[]'$  - 0 ?Nutritional Assessment / Counseling / Intervention ?'[]'$  - 0 ?Lower Extremity Assessment (monofilament, tuning fork, pulses) ?'[]'$  - 0 ?Peripheral Arterial Disease Assessment (using hand held doppler) ?ASSESSMENTS - Ostomy and/or Continence Assessment and Care ?'[]'$  - 0 ?Incontinence Assessment and Management ?'[]'$  - 0 ?Ostomy Care Assessment and Management (repouching, etc.) ?PROCESS - Coordination of Care ?X - Simple Patient / Family Education for ongoing care 1 15 ?'[]'$  - 0 ?Complex (extensive) Patient / Family Education for ongoing care ?X- 1 10 ?Staff obtains Consents, Records, T Results / Process Orders ?est ?X- 1 10 ?Staff telephones HHA, Nursing Homes / Clarify orders / etc ?'[]'$  - 0 ?Routine Transfer to another Facility (non-emergent condition) ?'[]'$  - 0 ?Routine Hospital Admission (non-emergent condition) ?'[]'$  - 0 ?New Admissions / Biomedical engineer / Ordering NPWT Apligraf, etc. ?, ?'[]'$  - 0 ?Emergency Hospital Admission (emergent condition) ?X- 1 10 ?Simple Discharge Coordination ?'[]'$  - 0 ?Complex (extensive) Discharge Coordination ?PROCESS - Special Needs ?'[]'$  - 0 ?Pediatric / Minor Patient Management ?'[]'$  - 0 ?Isolation Patient Management ?'[]'$  - 0 ?Hearing / Language / Visual special needs ?'[]'$  - 0 ?Assessment of Community assistance (transportation, D/C planning, etc.) ?'[]'$  - 0 ?Additional assistance / Altered mentation ?'[]'$  - 0 ?Support Surface(s) Assessment (bed, cushion, seat,  etc.) ?INTERVENTIONS - Wound Cleansing / Measurement ?X - Simple Wound Cleansing - one wound 1 5 ?'[]'$  - 0 ?Complex Wound Cleansing - multiple wounds ?X- 1 5 ?Wound  Imaging (photographs - any number of wounds) ?'[]'$  - 0 ?Wound Tracing (instead of photographs) ?X- 1 5 ?Simple Wound Measurement - one wound ?'[]'$  - 0 ?Complex Wound Measurement - multiple wounds ?INTERVENTIONS - Wound Dressings ?'[]'$  - 0 ?Small Wound Dressing one or multiple wounds ?'[]'$  - 0 ?Medium Wound Dressing one or multiple wounds ?'[]'$  - 0 ?Large Wound Dressing one or multiple wounds ?'[]'$  - 0 ?Application of Medications - topical ?'[]'$  - 0 ?Application of Medications - injection ?INTERVENTIONS - Miscellaneous ?'[]'$  - 0 ?External ear exam ?'[]'$  - 0 ?Specimen Collection (cultures, biopsies, blood, body fluids, etc.) ?'[]'$  - 0 ?Specimen(s) / Culture(s) sent or taken to Lab for analysis ?'[]'$  - 0 ?Patient Transfer (multiple staff / Civil Service fast streamer / Similar devices) ?'[]'$  - 0 ?Simple Staple / Suture removal (25 or less) ?'[]'$  - 0 ?Complex Staple / Suture removal (26 or more) ?'[]'$  - 0 ?Hypo / Hyperglycemic Management (close monitor of Blood Glucose) ?'[]'$  - 0 ?Ankle / Brachial Index (ABI) - do not check if billed separately ?X- 1 5 ?Vital Signs ?Has the patient been seen at the hospital within the last three years: Yes ?Total Score: 85 ?Level Of Care: New/Established - Level 3 ?Electronic Signature(s) ?Signed: 07/01/2021 3:20:29 PM By: Dellie Catholic RN ?Entered By: Dellie Catholic on 07/01/2021 15:18:47 ?-------------------------------------------------------------------------------- ?Encounter Discharge Information Details ?Patient Name: Date of Service: ?[name], [name] 07/01/2021 9:15 A M ?Medical Record Number: [id number] ?Patient Account Number: [id number] ?Date of Birth/Sex: Treating RN: ?03-Jun-1970 (51 y.o. F) Scotton, [name] ?Primary Care Paula Bass: Benito Mccreedy Other Clinician: ?Referring Demetress Tift: ?Treating Muadh Creasy/Extender: [name] ?[name], [name] ?Weeks  in Treatment: 5 ?Encounter Discharge Information Items ?Discharge Condition: Stable ?Ambulatory Status: Ambulatory ?Discharge Destination: Home ?Transportation: Private Auto ?Accompanied By: self ?Schedule Follow-up Appointment: Yes ?Clinical Summary of Care: Patient Declined ?Electronic Signature(s) ?Signed: 07/01/2021 3:20:29 PM By: Dellie Catholic RN ?Entered By: Dellie Catholic on 07/01/2021 15:19:25 ?-------------------------------------------------------------------------------- ?Lower Extremity Assessment Details ?Patient Name: Date of Service: ?Paula Bass, [name] 07/01/2021 9:15 A M ?Medical Record Number: [id number] ?Patient Account Number: [id number] ?Date of Birth/Sex: Treating RN: ?1970-03-12 (51 y.o. F) Scotton, [name] ?Primary Care Asenath Balash: Benito Mccreedy Other Clinician: ?Referring Nikia Mangino: ?Treating Yeimi Debnam/Extender: [name] ?[name], [name]: 5 ?Edema Assessment ?Assessed: [Left: No] [Right: No] ?Edema: [Left: N] [Right: o] ?Calf ?Left: Right: ?Point of Measurement: 31 cm From Medial Instep 32.5 cm ?Ankle ?Left: Right: ?Point of Measurement: 8 cm From Medial Instep 18.4 cm ?Electronic Signature(s) ?Signed: 07/01/2021 3:20:29 PM By: Dellie Catholic RN ?Entered By: Dellie Catholic on 07/01/2021 09:28:45 ?-------------------------------------------------------------------------------- ?Multi Wound Chart Details ?Patient Name: ?Date of Service: ?Rady, [name] 07/01/2021 9:15 A M ?Medical Record Number: [id number] ?Patient Account Number: [id number] ?Date of Birth/Sex: ?Treating RN: ?10-15-1970 (51 y.o. [name], [name] ?Primary Care Luan Maberry: Benito Mccreedy ?Other Clinician: ?Referring Cricket Goodlin: ?Treating Damond Borchers/Extender: [name] ?[name], [name]: 5 ?Vital Signs ?Height(in): 62 ?Pulse(bpm): 78 ?Weight(lbs): 134 ?Blood Pressure(mmHg): 118/79 ?Body Mass Index(BMI): 24.5 ?Temperature(??F): 98 ?Respiratory Rate(breaths/min): 16 ?Photos:  [N/A:N/A] ?Left, Plantar T [place] N/A N/A ?Wound Location: ?Gradually Appeared N/A N/A ?Wounding Event: ?Diabetic Wound/Ulcer of the Lower N/A N/A ?Primary Etiology: ?Extremity ?Hypertension, Type II

## 2021-07-14 DIAGNOSIS — E108 Type 1 diabetes mellitus with unspecified complications: Secondary | ICD-10-CM | POA: Diagnosis not present

## 2021-07-14 DIAGNOSIS — Z794 Long term (current) use of insulin: Secondary | ICD-10-CM | POA: Diagnosis not present

## 2021-07-18 DIAGNOSIS — H524 Presbyopia: Secondary | ICD-10-CM | POA: Diagnosis not present

## 2021-07-18 DIAGNOSIS — Z940 Kidney transplant status: Secondary | ICD-10-CM | POA: Diagnosis not present

## 2021-07-18 DIAGNOSIS — S0501XA Injury of conjunctiva and corneal abrasion without foreign body, right eye, initial encounter: Secondary | ICD-10-CM | POA: Diagnosis not present

## 2021-07-18 DIAGNOSIS — E1122 Type 2 diabetes mellitus with diabetic chronic kidney disease: Secondary | ICD-10-CM | POA: Diagnosis not present

## 2021-07-18 DIAGNOSIS — Z0100 Encounter for examination of eyes and vision without abnormal findings: Secondary | ICD-10-CM | POA: Diagnosis not present

## 2021-07-22 ENCOUNTER — Ambulatory Visit (INDEPENDENT_AMBULATORY_CARE_PROVIDER_SITE_OTHER): Payer: Medicare HMO | Admitting: Obstetrics

## 2021-07-22 ENCOUNTER — Other Ambulatory Visit (HOSPITAL_COMMUNITY)
Admission: RE | Admit: 2021-07-22 | Discharge: 2021-07-22 | Disposition: A | Discharge status: Home / Self Care | Payer: Medicare HMO | Source: Ambulatory Visit | Attending: Obstetrics | Admitting: Obstetrics

## 2021-07-22 ENCOUNTER — Encounter: Payer: Self-pay | Admitting: Obstetrics

## 2021-07-22 VITALS — BP 129/80 | HR 80 | Ht 62.0 in | Wt 138.7 lb

## 2021-07-22 DIAGNOSIS — N9089 Other specified noninflammatory disorders of vulva and perineum: Secondary | ICD-10-CM

## 2021-07-22 DIAGNOSIS — Z1151 Encounter for screening for human papillomavirus (HPV): Secondary | ICD-10-CM | POA: Diagnosis not present

## 2021-07-22 DIAGNOSIS — B9689 Other specified bacterial agents as the cause of diseases classified elsewhere: Secondary | ICD-10-CM | POA: Diagnosis not present

## 2021-07-22 DIAGNOSIS — Z01419 Encounter for gynecological examination (general) (routine) without abnormal findings: Secondary | ICD-10-CM

## 2021-07-22 DIAGNOSIS — N898 Other specified noninflammatory disorders of vagina: Secondary | ICD-10-CM | POA: Insufficient documentation

## 2021-07-22 DIAGNOSIS — N760 Acute vaginitis: Secondary | ICD-10-CM | POA: Diagnosis not present

## 2021-07-22 DIAGNOSIS — Z113 Encounter for screening for infections with a predominantly sexual mode of transmission: Secondary | ICD-10-CM | POA: Diagnosis not present

## 2021-07-22 DIAGNOSIS — R69 Illness, unspecified: Secondary | ICD-10-CM | POA: Diagnosis not present

## 2021-07-22 LAB — POCT URINALYSIS DIPSTICK
Bilirubin, UA: NEGATIVE
Blood, UA: NEGATIVE
Glucose, UA: NEGATIVE
Ketones, UA: NEGATIVE
Leukocytes, UA: NEGATIVE
Nitrite, UA: NEGATIVE
Odor: NEGATIVE
Protein, UA: NEGATIVE
Spec Grav, UA: 1.015 (ref 1.010–1.025)
Urobilinogen, UA: 0.2 E.U./dL
pH, UA: 7 (ref 5.0–8.0)

## 2021-07-22 MED ORDER — METRONIDAZOLE 0.75 % VA GEL: 1.0000 | Freq: Two times a day (BID) | VAGINAL | 5 refills | Status: DC

## 2021-07-22 MED ORDER — VALACYCLOVIR HCL 1 G PO TABS: 1000.0000 mg | ORAL_TABLET | Freq: Two times a day (BID) | ORAL | 0 refills | Status: DC

## 2021-07-22 NOTE — Progress Notes (Signed)
Pt presents for annual and all STD testing. Pt reports vaginal burning while voiding.  Negative Mammogram 06/14/2021 Negative Pap 07/21/2020

## 2021-07-22 NOTE — Progress Notes (Addendum)
Subjective:        Paula Bass is a 51 y.o. female here for a routine exam.  Current complaints: Malodorous vaginal discharge, and sore on vulva.    Personal health questionnaire:  Is patient Ashkenazi Jewish, have a family history of breast and/or ovarian cancer: no Is there a family history of uterine cancer diagnosed at age < 52, gastrointestinal cancer, urinary tract cancer, family member who is a Personnel officer syndrome-associated carrier: no Is the patient overweight and hypertensive, family history of diabetes, personal history of gestational diabetes, preeclampsia or PCOS: no Is patient over 18, have PCOS,  family history of premature CHD under age 28, diabetes, smoke, have hypertension or peripheral artery disease:  no At any time, has a partner hit, kicked or otherwise hurt or frightened you?: no Over the past 2 weeks, have you felt down, depressed or hopeless?: no Over the past 2 weeks, have you felt little interest or pleasure in doing things?:no   Gynecologic History Patient's last menstrual period was 02/16/2018 (approximate). Contraception: post menopausal status Last Pap: 2022. Results were: normal Last mammogram: 2023. Results were: normal  Obstetric History OB History  Gravida Para Term Preterm AB Living  2       1 1   SAB IAB Ectopic Multiple Live Births    1          # Outcome Date GA Lbr Len/2nd Weight Sex Delivery Anes PTL Lv  2 Gravida           1 IAB             Past Medical History:  Diagnosis Date   Acute diverticulitis 12/01/2017   Anemia    CAD in native artery    a. PCI to pRCA in 2017 in [place]. b.  In 03/2018 she had a repeat cath with 80-90% mid/distal LCx s/p synergy DES, with residual RCA stenosis mg'd medically. c.01/2020 s/p orbital atherectomy/DES to the OM1 and OM2, residual RCA stenosis.   Diabetes mellitus without complication [facility])    Dialysis patient [facility])    Diverticulitis    ESRD (end stage renal disease) (HCC)    GI bleeding     Hypertension    LGI bleed 12/16/2017   Lower GI bleed    Lupus (HCC)    Renal disorder    dialysis   UTI (urinary tract infection) 12/01/2017    Past Surgical History:  Procedure Laterality Date   BIOPSY  10/24/2019   Procedure: BIOPSY;  Surgeon: Kathi Der, MD;  Location: [facility];  Service: Gastroenterology;;  EGD and COLON   CARDIAC SURGERY     cardiac shunt   COLONOSCOPY WITH PROPOFOL N/A 10/24/2019   Procedure: COLONOSCOPY WITH PROPOFOL;  Surgeon: Kathi Der, MD;  Location: [facility];  Service: Gastroenterology;  Laterality: N/A;   CORONARY ATHERECTOMY N/A 02/13/2020   Procedure: CORONARY ATHERECTOMY;  Surgeon: Yvonne Kendall, MD;  Location: [facility];  Service: Cardiovascular;  Laterality: N/A;   CORONARY STENT INTERVENTION N/A 04/19/2018   Procedure: CORONARY STENT INTERVENTION;  Surgeon: Yvonne Kendall, MD;  Location: [facility];  Service: Cardiovascular;  Laterality: N/A;   CORONARY STENT INTERVENTION N/A 02/13/2020   Procedure: CORONARY STENT INTERVENTION;  Surgeon: Yvonne Kendall, MD;  [facility]: [facility];  Service: Cardiovascular;  Laterality: N/A;   ESOPHAGOGASTRODUODENOSCOPY (EGD) WITH PROPOFOL N/A 10/24/2019   Procedure: ESOPHAGOGASTRODUODENOSCOPY (EGD) WITH PROPOFOL;  Surgeon: Kathi Der, MD;  Location: [facility];  Service: Gastroenterology;  Laterality: N/A;  INCISION AND DRAINAGE PERIRECTAL ABSCESS N/A 07/09/2019   Procedure: IRRIGATION AND DEBRIDEMEN right buttock ABSCESS;  Surgeon: Sheliah Hatch, De Blanch, MD;  Location: [facility];  Service: General;  Laterality: N/A;   INTRAVASCULAR PRESSURE WIRE/FFR STUDY N/A 02/13/2020   Procedure: INTRAVASCULAR PRESSURE WIRE/FFR STUDY;  Surgeon: Yvonne Kendall, MD;  [facility];  Service: Cardiovascular;  Laterality: N/A;   Kidney graft Left    LEFT HEART CATH AND CORONARY ANGIOGRAPHY N/A 04/19/2018   Procedure: LEFT HEART CATH AND CORONARY ANGIOGRAPHY;   Surgeon: Yvonne Kendall, MD;  [facility];  Service: Cardiovascular;  Laterality: N/A;   LEFT HEART CATH AND CORONARY ANGIOGRAPHY N/A 02/13/2020   Procedure: LEFT HEART CATH AND CORONARY ANGIOGRAPHY;  Surgeon: Yvonne Kendall, MD;  [facility];  Service: Cardiovascular;  Laterality: N/A;   POLYPECTOMY  10/24/2019   Procedure: POLYPECTOMY;  Surgeon: Kathi Der, MD;  Location: [facility];  Service: Gastroenterology;;     Current Outpatient Medications:    acetaminophen (TYLENOL) 325 MG tablet, Take 2 tablets (650 mg total) by mouth every 4 (four) hours as needed for fever, headache or mild pain., Disp:  , Rfl:    aspirin EC 81 MG tablet, Take 1 tablet (81 mg total) by mouth daily., Disp: , Rfl:    B Complex-C-Zn-Folic Acid (DIALYVITE/ZINC) TABS, Take 1 tablet by mouth daily., Disp: , Rfl:    clopidogrel (PLAVIX) 75 MG tablet, Take 1 tablet (75 mg total) by mouth daily., Disp: , Rfl:    famotidine (PEPCID) 20 MG tablet, Take 20 mg by mouth daily., Disp: , Rfl:    insulin aspart (NOVOLOG) 100 UNIT/ML FlexPen, Inject into the skin. 15 units breakfast, 15units lunch, 15 units dinner, Disp: , Rfl:    insulin NPH-regular Human (70-30) 100 UNIT/ML injection, Inject 30-40 Units into the skin See admin instructions. Inject 30 units in the morning and 30 units at bedtime., Disp: 10 mL, Rfl: 1   metoprolol tartrate (LOPRESSOR) 25 MG tablet, Take 1 tablet (25 mg total) by mouth 2 (two) times daily., Disp: 180 tablet, Rfl: 2   metroNIDAZOLE (METROGEL VAGINAL) 0.75 % vaginal gel, Place 1 Applicatorful vaginally 2 (two) times daily., Disp: 70 g, Rfl: 5   multivitamin (RENA-VIT) TABS tablet, Take 1 tablet by mouth daily., Disp: , Rfl:    mycophenolate (CELLCEPT) 250 MG capsule, Take by mouth., Disp: , Rfl:    nitroGLYCERIN (NITROSTAT) 0.4 MG SL tablet, Place 1 tablet (0.4 mg total) under the tongue every 5 (five) minutes as needed., Disp: 25 tablet, Rfl: 2   nystatin  (MYCOSTATIN) 100000 UNIT/ML suspension, Take by mouth., Disp: , Rfl:    Polyethyl Glycol-Propyl Glycol 0.4-0.3 % SOLN, Place 1 drop into both eyes 3 (three) times daily as needed (dry/irritated eyes.)., Disp: , Rfl:    predniSONE (DELTASONE) 5 MG tablet, Take 5 mg by mouth daily with breakfast., Disp: , Rfl:    sulfamethoxazole-trimethoprim (BACTRIM DS) 800-160 MG tablet, Take by mouth., Disp: , Rfl:    valACYclovir (VALTREX) 1000 MG tablet, Take 1 tablet (1,000 mg total) by mouth 2 (two) times daily. Take for 10 days, Disp: 20 tablet, Rfl: 0   albuterol (VENTOLIN HFA) 108 (90 Base) MCG/ACT inhaler, 2 Puff(s) By Mouth Every 6 Hours (Patient not taking: Reported on 07/22/2021), Disp: , Rfl:    atorvastatin (LIPITOR) 40 MG tablet, Take 1 tablet by mouth once daily (Patient not taking: Reported on 07/22/2021), Disp: 90 tablet, Rfl: 3   AURYXIA 1  GM 210 MG(Fe) tablet, Take 210-420 tablets by mouth See admin instructions. Take 420 mg by mouth three times a day with meals and 210 mg twice a day with a snack (Patient not taking: Reported on 07/22/2021), Disp: , Rfl:    lidocaine-prilocaine (EMLA) cream, Apply 1 application topically Every Tuesday,Thursday,and Saturday with dialysis.  (Patient not taking: Reported on 07/22/2021), Disp: , Rfl:    moxifloxacin (VIGAMOX) 0.5 % ophthalmic solution, Place 1 drop into the right eye 4 (four) times daily., Disp: , Rfl:    propranolol (INDERAL) 10 MG tablet, Take 1 tablet (10 mg total) by mouth 4 (four) times daily as needed (palpitations). (Patient not taking: Reported on 07/22/2021), Disp: 90 tablet, Rfl: 3   tacrolimus (PROGRAF) 1 MG capsule, Take by mouth. (Patient not taking: Reported on 07/22/2021), Disp: , Rfl:  Allergies  Allergen Reactions   Penicillins Hives    DID THE REACTION INVOLVE: Swelling of the face/tongue/throat, SOB, or low BP? Yes Sudden or severe rash/hives, skin peeling, or the inside of the mouth or nose? Yes Did it require medical treatment?  No When did it last happen? Within the past 10 years If all above answers are "NO", may proceed with cephalosporin use. Ceftriaxone/merrem ok    Social History   Tobacco Use   Smoking status: Never   Smokeless tobacco: Never  Substance Use Topics   Alcohol use: Never    Family History  Problem Relation Age of Onset   Hypertension Mother    Other Father        unkown health history   Healthy Brother    Healthy Brother    Colon cancer Neg Hx    Esophageal cancer Neg Hx    Breast cancer Neg Hx       Review of Systems  Constitutional: negative for fatigue and weight loss Respiratory: negative for cough and wheezing Cardiovascular: negative for chest pain, fatigue and palpitations Gastrointestinal: negative for abdominal pain and change in bowel habits Musculoskeletal:negative for myalgias Neurological: negative for gait problems and tremors Behavioral/Psych: negative for abusive relationship, depression Endocrine: negative for temperature intolerance    Genitourinary: positive for vaginal discharge and sore on vulva.  negative for abnormal menstrual periods, hot flashes, sexual problems  Integument/breast: negative for breast lump, breast tenderness, nipple discharge and skin lesion(s)    Objective:       BP 129/80   Pulse 80   Ht 5\' 2"  (1.575 m)   Wt 138 lb 11.2 oz (62.9 kg)   LMP 02/16/2018 (Approximate)   BMI 25.37 kg/m  General:   Alert and no distress  Skin:   no rash or abnormalities  Lungs:   clear to auscultation bilaterally  Heart:   regular rate and rhythm, S1, S2 normal, no murmur, click, rub or gallop  Breasts:   normal without suspicious masses, skin or nipple changes or axillary nodes  Abdomen:  normal findings: no organomegaly, soft, non-tender and no hernia  Pelvis:  External genitalia: normal general appearance Urinary system: urethral meatus normal and bladder without fullness, nontender Vaginal: normal without tenderness, induration or  masses Cervix: normal appearance Adnexa: normal bimanual exam Uterus: anteverted and non-tender, normal size   Lab Review Urine pregnancy test Labs reviewed yes Radiologic studies reviewed yes  I have spent a total of 20 minutes of face-to-face time, excluding clinical staff time, reviewing notes and preparing to see patient, ordering tests and/or medications, and counseling the patient.   Assessment:    1. Encounter for  gynecological examination with Papanicolaou smear of cervix Rx: - Cytology - PAP( [place]) - POCT Urinalysis Dipstick  2. Vaginal discharge Rx: - Cervicovaginal ancillary only  3. BV (bacterial vaginosis) Rx: - metroNIDAZOLE (METROGEL VAGINAL) 0.75 % vaginal gel; Place 1 Applicatorful vaginally 2 (two) times daily.  Dispense: 70 g; Refill: 5  4. Screening for STD (sexually transmitted disease) Rx: - Hepatitis B surface antigen - Hepatitis C antibody - RPR - HIV Antibody (routine testing w rflx)  5. Lesion of vulva Rx: - Herpes simplex virus culture - valACYclovir (VALTREX) 1000 MG tablet; Take 1 tablet (1,000 mg total) by mouth 2 (two) times daily. Take for 10 days  Dispense: 20 tablet; Refill: 0     Plan:    Education reviewed: calcium supplements, depression evaluation, low fat, low cholesterol diet, safe sex/STD prevention, self breast exams, and weight bearing exercise. Follow up in: 1 year.   Meds ordered this encounter  Medications   valACYclovir (VALTREX) 1000 MG tablet    Sig: Take 1 tablet (1,000 mg total) by mouth 2 (two) times daily. Take for 10 days    Dispense:  20 tablet    Refill:  0   metroNIDAZOLE (METROGEL VAGINAL) 0.75 % vaginal gel    Sig: Place 1 Applicatorful vaginally 2 (two) times daily.    Dispense:  70 g    Refill:  5   Orders Placed This Encounter  Procedures   Herpes simplex virus culture   Hepatitis B surface antigen   Hepatitis C antibody   RPR   HIV Antibody (routine testing w rflx)   POCT Urinalysis  Dipstick     Brock Bad, MD 07/22/2021 1:42 PM

## 2021-07-25 LAB — HERPES SIMPLEX VIRUS CULTURE

## 2021-07-26 ENCOUNTER — Other Ambulatory Visit: Payer: Self-pay | Admitting: Obstetrics

## 2021-07-26 LAB — CERVICOVAGINAL ANCILLARY ONLY
Bacterial Vaginitis (gardnerella): POSITIVE — AB
Candida Glabrata: NEGATIVE
Candida Vaginitis: NEGATIVE
Chlamydia: NEGATIVE
Comment: NEGATIVE
Comment: NEGATIVE
Comment: NEGATIVE
Comment: NEGATIVE
Comment: NEGATIVE
Comment: NORMAL
Neisseria Gonorrhea: NEGATIVE
Trichomonas: NEGATIVE

## 2021-07-28 LAB — CYTOLOGY - PAP
Comment: NEGATIVE
Diagnosis: NEGATIVE
Diagnosis: REACTIVE
High risk HPV: NEGATIVE

## 2021-08-01 DIAGNOSIS — Z794 Long term (current) use of insulin: Secondary | ICD-10-CM | POA: Diagnosis not present

## 2021-08-01 DIAGNOSIS — E663 Overweight: Secondary | ICD-10-CM | POA: Diagnosis not present

## 2021-08-01 DIAGNOSIS — E559 Vitamin D deficiency, unspecified: Secondary | ICD-10-CM | POA: Diagnosis not present

## 2021-08-01 DIAGNOSIS — Z940 Kidney transplant status: Secondary | ICD-10-CM | POA: Diagnosis not present

## 2021-08-01 DIAGNOSIS — M1990 Unspecified osteoarthritis, unspecified site: Secondary | ICD-10-CM | POA: Diagnosis not present

## 2021-08-01 DIAGNOSIS — G629 Polyneuropathy, unspecified: Secondary | ICD-10-CM | POA: Diagnosis not present

## 2021-08-01 DIAGNOSIS — E1122 Type 2 diabetes mellitus with diabetic chronic kidney disease: Secondary | ICD-10-CM | POA: Diagnosis not present

## 2021-08-01 DIAGNOSIS — I10 Essential (primary) hypertension: Secondary | ICD-10-CM | POA: Diagnosis not present

## 2021-08-01 DIAGNOSIS — E782 Mixed hyperlipidemia: Secondary | ICD-10-CM | POA: Diagnosis not present

## 2021-08-01 DIAGNOSIS — Z111 Encounter for screening for respiratory tuberculosis: Secondary | ICD-10-CM | POA: Diagnosis not present

## 2021-08-02 DIAGNOSIS — K219 Gastro-esophageal reflux disease without esophagitis: Secondary | ICD-10-CM | POA: Diagnosis not present

## 2021-08-02 DIAGNOSIS — E1136 Type 2 diabetes mellitus with diabetic cataract: Secondary | ICD-10-CM | POA: Diagnosis not present

## 2021-08-02 DIAGNOSIS — Z7902 Long term (current) use of antithrombotics/antiplatelets: Secondary | ICD-10-CM | POA: Diagnosis not present

## 2021-08-02 DIAGNOSIS — I2510 Atherosclerotic heart disease of native coronary artery without angina pectoris: Secondary | ICD-10-CM | POA: Diagnosis not present

## 2021-08-02 DIAGNOSIS — I10 Essential (primary) hypertension: Secondary | ICD-10-CM | POA: Diagnosis not present

## 2021-08-02 DIAGNOSIS — J45909 Unspecified asthma, uncomplicated: Secondary | ICD-10-CM | POA: Diagnosis not present

## 2021-08-02 DIAGNOSIS — E1142 Type 2 diabetes mellitus with diabetic polyneuropathy: Secondary | ICD-10-CM | POA: Diagnosis not present

## 2021-08-02 DIAGNOSIS — Z008 Encounter for other general examination: Secondary | ICD-10-CM | POA: Diagnosis not present

## 2021-08-02 DIAGNOSIS — Z7952 Long term (current) use of systemic steroids: Secondary | ICD-10-CM | POA: Diagnosis not present

## 2021-08-02 DIAGNOSIS — E785 Hyperlipidemia, unspecified: Secondary | ICD-10-CM | POA: Diagnosis not present

## 2021-08-02 DIAGNOSIS — Z794 Long term (current) use of insulin: Secondary | ICD-10-CM | POA: Diagnosis not present

## 2021-08-02 DIAGNOSIS — M329 Systemic lupus erythematosus, unspecified: Secondary | ICD-10-CM | POA: Diagnosis not present

## 2021-08-02 DIAGNOSIS — Z940 Kidney transplant status: Secondary | ICD-10-CM | POA: Diagnosis not present

## 2021-08-13 DIAGNOSIS — E108 Type 1 diabetes mellitus with unspecified complications: Secondary | ICD-10-CM | POA: Diagnosis not present

## 2021-08-13 DIAGNOSIS — Z794 Long term (current) use of insulin: Secondary | ICD-10-CM | POA: Diagnosis not present

## 2021-08-23 DIAGNOSIS — Z940 Kidney transplant status: Secondary | ICD-10-CM | POA: Diagnosis not present

## 2021-09-01 DIAGNOSIS — Z940 Kidney transplant status: Secondary | ICD-10-CM | POA: Diagnosis not present

## 2021-09-01 DIAGNOSIS — M3214 Glomerular disease in systemic lupus erythematosus: Secondary | ICD-10-CM | POA: Diagnosis not present

## 2021-09-01 DIAGNOSIS — Z79899 Other long term (current) drug therapy: Secondary | ICD-10-CM | POA: Diagnosis not present

## 2021-09-01 DIAGNOSIS — I2510 Atherosclerotic heart disease of native coronary artery without angina pectoris: Secondary | ICD-10-CM | POA: Diagnosis not present

## 2021-09-01 DIAGNOSIS — I129 Hypertensive chronic kidney disease with stage 1 through stage 4 chronic kidney disease, or unspecified chronic kidney disease: Secondary | ICD-10-CM | POA: Diagnosis not present

## 2021-09-01 DIAGNOSIS — E1122 Type 2 diabetes mellitus with diabetic chronic kidney disease: Secondary | ICD-10-CM | POA: Diagnosis not present

## 2021-09-09 DIAGNOSIS — E113291 Type 2 diabetes mellitus with mild nonproliferative diabetic retinopathy without macular edema, right eye: Secondary | ICD-10-CM | POA: Diagnosis not present

## 2021-09-09 DIAGNOSIS — E113392 Type 2 diabetes mellitus with moderate nonproliferative diabetic retinopathy without macular edema, left eye: Secondary | ICD-10-CM | POA: Diagnosis not present

## 2021-09-09 DIAGNOSIS — S0501XA Injury of conjunctiva and corneal abrasion without foreign body, right eye, initial encounter: Secondary | ICD-10-CM | POA: Diagnosis not present

## 2021-09-09 DIAGNOSIS — Z794 Long term (current) use of insulin: Secondary | ICD-10-CM | POA: Diagnosis not present

## 2021-09-09 DIAGNOSIS — S0502XA Injury of conjunctiva and corneal abrasion without foreign body, left eye, initial encounter: Secondary | ICD-10-CM | POA: Diagnosis not present

## 2021-09-12 DIAGNOSIS — S0501XA Injury of conjunctiva and corneal abrasion without foreign body, right eye, initial encounter: Secondary | ICD-10-CM | POA: Diagnosis not present

## 2021-09-12 DIAGNOSIS — E108 Type 1 diabetes mellitus with unspecified complications: Secondary | ICD-10-CM | POA: Diagnosis not present

## 2021-09-12 DIAGNOSIS — S0502XD Injury of conjunctiva and corneal abrasion without foreign body, left eye, subsequent encounter: Secondary | ICD-10-CM | POA: Diagnosis not present

## 2021-09-12 DIAGNOSIS — Z794 Long term (current) use of insulin: Secondary | ICD-10-CM | POA: Diagnosis not present

## 2021-09-16 DIAGNOSIS — S0502XD Injury of conjunctiva and corneal abrasion without foreign body, left eye, subsequent encounter: Secondary | ICD-10-CM | POA: Diagnosis not present

## 2021-09-16 DIAGNOSIS — S0501XA Injury of conjunctiva and corneal abrasion without foreign body, right eye, initial encounter: Secondary | ICD-10-CM | POA: Diagnosis not present

## 2021-09-28 DIAGNOSIS — S0501XA Injury of conjunctiva and corneal abrasion without foreign body, right eye, initial encounter: Secondary | ICD-10-CM | POA: Diagnosis not present

## 2021-09-28 DIAGNOSIS — S0502XD Injury of conjunctiva and corneal abrasion without foreign body, left eye, subsequent encounter: Secondary | ICD-10-CM | POA: Diagnosis not present

## 2021-10-17 DIAGNOSIS — E559 Vitamin D deficiency, unspecified: Secondary | ICD-10-CM | POA: Diagnosis not present

## 2021-10-17 DIAGNOSIS — E1122 Type 2 diabetes mellitus with diabetic chronic kidney disease: Secondary | ICD-10-CM | POA: Diagnosis not present

## 2021-10-17 DIAGNOSIS — G629 Polyneuropathy, unspecified: Secondary | ICD-10-CM | POA: Diagnosis not present

## 2021-10-17 DIAGNOSIS — Z794 Long term (current) use of insulin: Secondary | ICD-10-CM | POA: Diagnosis not present

## 2021-10-17 DIAGNOSIS — Z940 Kidney transplant status: Secondary | ICD-10-CM | POA: Diagnosis not present

## 2021-10-17 DIAGNOSIS — I10 Essential (primary) hypertension: Secondary | ICD-10-CM | POA: Diagnosis not present

## 2021-10-17 DIAGNOSIS — M1990 Unspecified osteoarthritis, unspecified site: Secondary | ICD-10-CM | POA: Diagnosis not present

## 2021-10-17 DIAGNOSIS — E782 Mixed hyperlipidemia: Secondary | ICD-10-CM | POA: Diagnosis not present

## 2021-10-17 DIAGNOSIS — E663 Overweight: Secondary | ICD-10-CM | POA: Diagnosis not present

## 2021-10-21 NOTE — Progress Notes (Unsigned)
Cardiology Office Note:    Date:  10/25/2021   ID:  Paula Bass, DOB [birth year], MRN [id number]  PCP:  Benito Mccreedy, MD   [facility] Providers Cardiologist:  Mertie Moores, MD     Referring MD: Benito Mccreedy, MD   Chief Complaint: follow-up CAD  History of Present Illness:    Paula Bass is a very pleasant 51 y.o. female with a hx of CAD s/p stent placement in [place] in 2017, HTN, HLD, ESRD s/p renal transplant, lupus, and PAF.   History of CAD with PCI to RCA in 2017 in [place].  In 03/2018 she had a repeat cath with 80 to 90% mid/distal LCx s/p Synergy DES, with residual RCA stenosis treated medically.  Her procedure was C/B a large right groin hematoma and a BAL anemia requiring 2 units PRBCs.  CT was negative for retroperitoneal hematoma.  In 05/17/2018 and 11/17/2018 she was still having residual chest pain with mixed features.  This was managed medically with titration of nitrate.  Nuclear stress test 01/2019 normal without ischemia. She underwent cardiac cath 02/13/2020 demonstrating findings below with resultant orbital arthrectomy/DES to the OM1 and OM 2.  Dr. Saunders Revel, interventional cardiologist, favored medical management of the residual RCA disease, to reconsider staged orbital atherectomy/PCI for residual symptoms.  She was advised to continue DAPT indefinitely with aspirin and Plavix.  She was last seen in our office by Dr. Acie Fredrickson on 03/22/2021. Had undergone renal transplant. Reported that she developed PAF following transplant. Was not placed on anticoagulation for CHA2DS2-VASc 2. Cardiac monitor 03/2021 revealed sinus rhythm, frequent PACs, rare PVCs, no serious arrhythmias. He advised her to return for 6 month follow-up.  Today, she is here alone for follow-up. Reports she is feeling well, less anxious than in the past. No evidence of return of a fib. SOB with activity on occasion but she is working on increasing activity. No chest pain. 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 She denies lower extremity edema, fatigue, palpitations, melena, hematuria, hemoptysis, diaphoresis, weakness, presyncope, syncope, orthopnea, and PND.  Past Medical History:  Diagnosis Date   Acute diverticulitis 12/01/2017   Anemia    CAD in native artery    a. PCI to [place] in 2017 in [place]. b.  In 03/2018 she had a repeat cath with 80-90% mid/distal LCx s/p synergy DES, with residual RCA stenosis mg'd medically. c.01/2020 s/p orbital atherectomy/DES to the OM1 and OM2, residual RCA stenosis.   Diabetes mellitus without complication [facility])    Dialysis patient [facility])    Diverticulitis    ESRD (end stage renal disease) ([place])    GI bleeding    Hypertension    LGI bleed 12/16/2017   Lower GI bleed    Lupus (HCC)    Renal disorder    dialysis   UTI (urinary tract infection) 12/01/2017    Past Surgical History:  Procedure Laterality Date   BIOPSY  10/24/2019   Procedure: BIOPSY;  Surgeon: Otis Brace, MD;  Location: [facility];  Service: Gastroenterology;;  EGD and COLON   CARDIAC SURGERY     cardiac shunt   COLONOSCOPY WITH PROPOFOL N/A 10/24/2019   Procedure: COLONOSCOPY WITH PROPOFOL;  Surgeon: Otis Brace, MD;  Location: [facility];  Service: Gastroenterology;  Laterality: N/A;   CORONARY ATHERECTOMY N/A 02/13/2020   Procedure: CORONARY ATHERECTOMY;  Surgeon: [name], MD;  Location: [place] [facility];  Service: Cardiovascular;  Laterality:  N/A;   CORONARY STENT INTERVENTION N/A 04/19/2018   Procedure: CORONARY STENT INTERVENTION;  Surgeon: [name], MD;  Location: [place] [facility];  Service: Cardiovascular;  Laterality: N/A;   CORONARY STENT INTERVENTION N/A 02/13/2020   Procedure: CORONARY STENT INTERVENTION;  Surgeon: [name], MD;  Location: [place] [facility];  Service: Cardiovascular;  Laterality: N/A;   ESOPHAGOGASTRODUODENOSCOPY (EGD) WITH PROPOFOL  N/A 10/24/2019   Procedure: ESOPHAGOGASTRODUODENOSCOPY (EGD) WITH PROPOFOL;  Surgeon: Otis Brace, MD;  Location: [facility];  Service: Gastroenterology;  Laterality: N/A;   INCISION AND DRAINAGE PERIRECTAL ABSCESS N/A 07/09/2019   Procedure: IRRIGATION AND DEBRIDEMEN right buttock ABSCESS;  Surgeon: Kieth Brightly, Arta Bruce, MD;  Location: [place];  Service: General;  Laterality: N/A;   INTRAVASCULAR PRESSURE WIRE/FFR STUDY N/A 02/13/2020   Procedure: INTRAVASCULAR PRESSURE WIRE/FFR STUDY;  Surgeon: [name], MD;  Location: [place] [facility];  Service: Cardiovascular;  Laterality: N/A;   Kidney graft Left    LEFT HEART CATH AND CORONARY ANGIOGRAPHY N/A 04/19/2018   Procedure: LEFT HEART CATH AND CORONARY ANGIOGRAPHY;  Surgeon: [name], MD;  Location: [place] [facility];  Service: Cardiovascular;  Laterality: N/A;   LEFT HEART CATH AND CORONARY ANGIOGRAPHY N/A 02/13/2020   Procedure: LEFT HEART CATH AND CORONARY ANGIOGRAPHY;  Surgeon: [name], MD;  Location: [place] [facility];  Service: Cardiovascular;  Laterality: N/A;   POLYPECTOMY  10/24/2019   Procedure: POLYPECTOMY;  Surgeon: Otis Brace, MD;  Location: [facility];  Service: Gastroenterology;;    Current Medications: Current Meds  Medication Sig   acetaminophen (TYLENOL) 325 MG tablet Take 2 tablets (650 mg total) by mouth every 4 (four) hours as needed for fever, headache or mild pain.   albuterol (VENTOLIN HFA) 108 (90 Base) MCG/ACT inhaler 2 Puff(s) By Mouth Every 6 Hours   aspirin EC 81 MG tablet Take 1 tablet (81 mg total) by mouth daily.   atorvastatin (LIPITOR) 40 MG tablet Take 1 tablet by mouth once daily   clopidogrel (PLAVIX) 75 MG tablet Take 1 tablet (75 mg total) by mouth daily.   famotidine (PEPCID) 20 MG tablet Take 20 mg by mouth daily.   insulin aspart (NOVOLOG) 100 UNIT/ML FlexPen Inject into the skin. 15 units breakfast, 15units lunch, 15 units dinner   Insulin Glargine (BASAGLAR  KWIKPEN) 100 UNIT/ML 15 Unit(s) SUB-Q Every Night   metoprolol tartrate (LOPRESSOR) 25 MG tablet Take 1 tablet (25 mg total) by mouth 2 (two) times daily.   multivitamin (RENA-VIT) TABS tablet Take 1 tablet by mouth daily.   mycophenolate (CELLCEPT) 250 MG capsule Take by mouth.   nystatin (MYCOSTATIN) 100000 UNIT/ML suspension Take by mouth.   predniSONE (DELTASONE) 5 MG tablet Take 5 mg by mouth daily with breakfast.   propranolol (INDERAL) 10 MG tablet Take 1 tablet (10 mg total) by mouth 4 (four) times daily as needed (palpitations).   sulfamethoxazole-trimethoprim (BACTRIM DS) 800-160 MG tablet Take by mouth.   tacrolimus (PROGRAF) 1 MG capsule Take by mouth.     Allergies:   Penicillins   Social History   Socioeconomic History   Marital status: Single    Spouse name: Not on file   Number of children: 1   Years of education: Not on file   Highest education level: Not on file  Occupational History   Occupation: disabled  Tobacco Use   Smoking status: Never   Smokeless tobacco: Never  Vaping Use   Vaping Use: Never used  Substance and Sexual Activity  Alcohol use: Never   Drug use: Never   Sexual activity: Not Currently    Partners: Male    Birth control/protection: Condom  Other Topics Concern   Not on file  Social History Narrative   Not on file   Social Determinants of Health   Financial Resource Strain: Not on file  Food Insecurity: Not on file  Transportation Needs: Not on file  Physical Activity: Not on file  Stress: Not on file  Social Connections: Not on file     Family History: The patient's family history includes Healthy in her brother and brother; Hypertension in her mother; Other in her father. There is no history of Colon cancer, Esophageal cancer, or Breast cancer.  ROS:   Please see the history of present illness.   All other systems reviewed and are negative.  Labs/Other Studies Reviewed:    The following studies were reviewed  today:  Cardiac monitor 04/13/21  Sinus rhythm Frequent PACs , rare PVCS No serious arrhythmias  Echo 05/12/20  1. Left ventricular ejection fraction, by estimation, is 60 to 65%. The  left ventricle has normal function. The left ventricle has no regional  wall motion abnormalities. There is mild left ventricular hypertrophy.  Left ventricular diastolic parameters  are indeterminate.   2. Right ventricular systolic function is normal. The right ventricular  size is normal. Tricuspid regurgitation signal is inadequate for assessing  PA pressure.   3. Right atrial size was mildly dilated.   4. The mitral valve is normal in structure. Trivial mitral valve  regurgitation.   5. The aortic valve is tricuspid. Aortic valve regurgitation is not  visualized. No aortic stenosis is present.   6. The inferior vena cava is normal in size with greater than 50%  respiratory variability, suggesting right atrial pressure of 3 mmHg.   LHC 02/13/20  Conclusions: Severe two-vessel coronary artery disease including multifocal disease involving the LCx/OM's (70% proximal OM1, 99% proximal OM2, and 60% mid/distal LCx at the takeoff of OM2) as well as serial 99% and 70% mid RCA lesions. Moderate, nonobstructive proximal/mid LAD disease that is not hemodynamically significant (FFR 0.91). Patent proximal RCA and distal LCx stents. Successful orbital atherectomy/PCI to OM1 using Resolute Onyx 3.0 x 26 mm drug-eluting stent with 0% residual stenosis and TIMI-3 flow. Successful orbital atherectomy/PCI to OM 2 using Resolute Onyx 2.25 x 8 mm drug-eluting stent with 0% residual stenosis and TIMI-3 flow.   Recommendations: Overnight extended recovery.  Nephrology notified to facilitate dialysis tomorrow prior to discharge or delay outpatient dialysis till later tomorrow. Continue indefinite dual antiplatelet therapy with aspirin and clopidogrel. Aggressive secondary prevention. Favor medical management of RCA.   If she continues to have some angina, orbital atherectomy/PCI could be considered in a staged fashion.  Recent Labs: 03/22/2021: BUN 19; Creatinine, Ser 1.22; Potassium 4.1; Sodium 137; TSH 0.773  Recent Lipid Panel    Component Value Date/Time   CHOL 120 04/15/2018 0948   TRIG 92 04/15/2018 0948   HDL 59 04/15/2018 0948   CHOLHDL 2.0 04/15/2018 0948   LDLCALC 43 04/15/2018 0948     Risk Assessment/Calculations:      Physical Exam:    VS:  BP 122/80   Pulse 60   Ht '5\' 2"'$  (1.575 m)   Wt 137 lb 12.8 oz (62.5 kg)   LMP 02/16/2018 (Approximate)   SpO2 97%   BMI 25.20 kg/m     Wt Readings from Last 3 Encounters:  10/25/21 137 lb 12.8 oz (  62.5 kg)  07/22/21 138 lb 11.2 oz (62.9 kg)  03/22/21 133 lb 12.8 oz (60.7 kg)     GEN:  Well nourished, well developed in no acute distress HEENT: Normal NECK: No JVD; No carotid bruits CARDIAC: Irregular RR, no murmurs, rubs, gallops RESPIRATORY:  Clear to auscultation without rales, wheezing or rhonchi  ABDOMEN: Soft, non-tender, non-distended MUSCULOSKELETAL:  No edema. Deformity to LUE from AV fistula. 2+ pedal pulses, equal bilaterally SKIN: Warm and dry NEUROLOGIC:  Alert and oriented x 3 PSYCHIATRIC:  Normal affect   EKG:  EKG is ordered today.  The ekg ordered today demonstrates sinus rhythm with occasional PACs at 70 bpm, no ST abnormality  Diagnoses:    1. Premature atrial contractions   2. Coronary artery disease involving native coronary artery of native heart without angina pectoris   3. Hyperlipidemia LDL goal <70   4. Essential hypertension   5. Kidney transplanted   6. PAF (paroxysmal atrial fibrillation) (HCC)    Assessment and Plan:     PACs: Irregularity noted on exam today.  EKG revealed sinus rhythm with occasional PACs. Discussed findings with patient. She is asymptomatic. Advised her to notify us for worsening palpitations or symptoms of tachycardia as she additionally had PAF after renal transplant.   Continue metoprolol.   CAD without angina: History of multivessel CAD with prior intervention since 2017. Most recent cath 01/2020 with successful orbital atherectomy/PCI to both OM1 and OM2 with DES to each vessel.  Medical management of RCA occlusion recommended unless angina progressed. She denies chest pain, dyspnea, or other symptoms concerning for angina.  No indication for further ischemic evaluation at this time. Is increasing physical activity. Encouraged 150 minutes of moderate intensity exercise on a weekly basis.  Continue to follow heart healthy, mostly plant-based diet.  Hyperlipidemia LDL goal < 70:  LDL 48 on 04/11/21. Continue atorvastatin.   PAF: Episode of PAF in the setting of renal transplant. Not on DOAC. Cardiac monitor 03/2021 revealed sinus rhythm.  No symptoms of palpitations, tachycardia.  As noted above she has occasional PACs.  Advised her to notify us if she becomes symptomatic with worsening palpitations, dyspnea, or other concerns.   Hypertension: BP is well controlled.  No medication changes today.     Disposition: 6 months with Dr. Acie Fredrickson  Medication Adjustments/Labs and Tests Ordered: Current medicines are reviewed at length with the patient today.  Concerns regarding medicines are outlined above.  Orders Placed This Encounter  Procedures   EKG 12-Lead   No orders of the defined types were placed in this encounter.   Patient Instructions   Medication Instructions:   Your physician recommends that you continue on your current medications as directed. Please refer to the Current Medication list given to you today.   *If you need a refill on your cardiac medications before your next appointment, please call your pharmacy*   Lab Work:  None ordered.  If you have labs (blood work) drawn today and your tests are completely normal, you will receive your results only by: [place] (if you have MyChart) OR A paper copy in the mail If you have any  lab test that is abnormal or we need to change your treatment, we will call you to review the results.   Testing/Procedures:  None ordered.   Follow-Up: At [facility], you and your health needs are our priority.  As part of our continuing mission to provide you with exceptional heart care, we have created  designated Provider Care Teams.  These Care Teams include your primary Cardiologist (physician) and Advanced Practice Providers (APPs -  Physician Assistants and Nurse Practitioners) who all work together to provide you with the care you need, when you need it.  We recommend signing up for the patient portal called "MyChart".  Sign up information is provided on this After Visit Summary.  MyChart is used to connect with patients for Virtual Visits (Telemedicine).  Patients are able to view lab/test results, encounter notes, upcoming appointments, etc.  Non-urgent messages can be sent to your provider as well.   To learn more about what you can do with MyChart, go to [email or web address].    Your next appointment:   6 month(s)  The format for your next appointment:   In Person  Provider:   Mertie Moores, MD     Important Information About Sugar       Exercise recommendations: Goal of exercising for at least 30 minutes a day, at least 5 times per week.  Please exercise to a moderate exertion.  This means that while exercising it is difficult to speak in full sentences, however you are not so short of breath that you feel you must stop, and not so comfortable that you can carry on a full conversation.  Exertion level should be approximately a 5/10, if 10 is the most exertion you can perform.  Diet recommendations: Recommend a heart healthy diet such as the Mediterranean diet.  This diet consists of plant based foods, healthy fats, lean meats, olive oil.  It suggests limiting the intake of simple carbohydrates such as white breads, pastries, and pastas.  It also limits the  amount of red meat, wine, and dairy products such as cheese that one should consume on a daily basis.    Signed, Emmaline Life, NP  10/25/2021 5:19 PM    [facility]

## 2021-10-25 ENCOUNTER — Encounter: Payer: Self-pay | Admitting: Nurse Practitioner

## 2021-10-25 ENCOUNTER — Ambulatory Visit: Payer: Medicare HMO | Attending: Nurse Practitioner | Admitting: Nurse Practitioner

## 2021-10-25 VITALS — BP 122/80 | HR 60 | Ht 62.0 in | Wt 137.8 lb

## 2021-10-25 DIAGNOSIS — I2510 Atherosclerotic heart disease of native coronary artery without angina pectoris: Secondary | ICD-10-CM | POA: Diagnosis not present

## 2021-10-25 DIAGNOSIS — I10 Essential (primary) hypertension: Secondary | ICD-10-CM | POA: Diagnosis not present

## 2021-10-25 DIAGNOSIS — E785 Hyperlipidemia, unspecified: Secondary | ICD-10-CM | POA: Diagnosis not present

## 2021-10-25 DIAGNOSIS — I480 Paroxysmal atrial fibrillation: Secondary | ICD-10-CM | POA: Diagnosis not present

## 2021-10-25 DIAGNOSIS — I491 Atrial premature depolarization: Secondary | ICD-10-CM

## 2021-10-25 DIAGNOSIS — Z940 Kidney transplant status: Secondary | ICD-10-CM

## 2021-10-25 DIAGNOSIS — E1122 Type 2 diabetes mellitus with diabetic chronic kidney disease: Secondary | ICD-10-CM | POA: Diagnosis not present

## 2021-10-25 NOTE — Patient Instructions (Addendum)
  Medication Instructions:   Your physician recommends that you continue on your current medications as directed. Please refer to the Current Medication list given to you today.   *If you need a refill on your cardiac medications before your next appointment, please call your pharmacy*   Lab Work:  None ordered.  If you have labs (blood work) drawn today and your tests are completely normal, you will receive your results only by: [place] (if you have MyChart) OR A paper copy in the mail If you have any lab test that is abnormal or we need to change your treatment, we will call you to review the results.   Testing/Procedures:  None ordered.   Follow-Up: At [facility], you and your health needs are our priority.  As part of our continuing mission to provide you with exceptional heart care, we have created designated Provider Care Teams.  These Care Teams include your primary Cardiologist (physician) and Advanced Practice Providers (APPs -  Physician Assistants and Nurse Practitioners) who all work together to provide you with the care you need, when you need it.  We recommend signing up for the patient portal called "MyChart".  Sign up information is provided on this After Visit Summary.  MyChart is used to connect with patients for Virtual Visits (Telemedicine).  Patients are able to view lab/test results, encounter notes, upcoming appointments, etc.  Non-urgent messages can be sent to your provider as well.   To learn more about what you can do with MyChart, go to [email or web address].    Your next appointment:   6 month(s)  The format for your next appointment:   In Person  Provider:   Mertie Moores, MD     Important Information About Sugar       Exercise recommendations: Goal of exercising for at least 30 minutes a day, at least 5 times per week.  Please exercise to a moderate exertion.  This means that while exercising it is difficult to speak in  full sentences, however you are not so short of breath that you feel you must stop, and not so comfortable that you can carry on a full conversation.  Exertion level should be approximately a 5/10, if 10 is the most exertion you can perform.  Diet recommendations: Recommend a heart healthy diet such as the Mediterranean diet.  This diet consists of plant based foods, healthy fats, lean meats, olive oil.  It suggests limiting the intake of simple carbohydrates such as white breads, pastries, and pastas.  It also limits the amount of red meat, wine, and dairy products such as cheese that one should consume on a daily basis.

## 2021-11-18 DIAGNOSIS — I10 Essential (primary) hypertension: Secondary | ICD-10-CM | POA: Diagnosis not present

## 2021-11-18 DIAGNOSIS — Z794 Long term (current) use of insulin: Secondary | ICD-10-CM | POA: Diagnosis not present

## 2021-11-18 DIAGNOSIS — Z940 Kidney transplant status: Secondary | ICD-10-CM | POA: Diagnosis not present

## 2021-11-18 DIAGNOSIS — E559 Vitamin D deficiency, unspecified: Secondary | ICD-10-CM | POA: Diagnosis not present

## 2021-11-18 DIAGNOSIS — G629 Polyneuropathy, unspecified: Secondary | ICD-10-CM | POA: Diagnosis not present

## 2021-11-18 DIAGNOSIS — E782 Mixed hyperlipidemia: Secondary | ICD-10-CM | POA: Diagnosis not present

## 2021-11-18 DIAGNOSIS — E1122 Type 2 diabetes mellitus with diabetic chronic kidney disease: Secondary | ICD-10-CM | POA: Diagnosis not present

## 2021-11-18 DIAGNOSIS — E663 Overweight: Secondary | ICD-10-CM | POA: Diagnosis not present

## 2021-11-18 DIAGNOSIS — M1990 Unspecified osteoarthritis, unspecified site: Secondary | ICD-10-CM | POA: Diagnosis not present

## 2021-11-21 DIAGNOSIS — Z940 Kidney transplant status: Secondary | ICD-10-CM | POA: Diagnosis not present

## 2021-11-26 DIAGNOSIS — E1122 Type 2 diabetes mellitus with diabetic chronic kidney disease: Secondary | ICD-10-CM | POA: Diagnosis not present

## 2021-11-26 DIAGNOSIS — Z794 Long term (current) use of insulin: Secondary | ICD-10-CM | POA: Diagnosis not present

## 2021-11-26 DIAGNOSIS — E782 Mixed hyperlipidemia: Secondary | ICD-10-CM | POA: Diagnosis not present

## 2021-12-01 DIAGNOSIS — M3214 Glomerular disease in systemic lupus erythematosus: Secondary | ICD-10-CM | POA: Diagnosis not present

## 2021-12-01 DIAGNOSIS — Z79899 Other long term (current) drug therapy: Secondary | ICD-10-CM | POA: Diagnosis not present

## 2021-12-01 DIAGNOSIS — I2510 Atherosclerotic heart disease of native coronary artery without angina pectoris: Secondary | ICD-10-CM | POA: Diagnosis not present

## 2021-12-01 DIAGNOSIS — Z940 Kidney transplant status: Secondary | ICD-10-CM | POA: Diagnosis not present

## 2021-12-01 DIAGNOSIS — E1122 Type 2 diabetes mellitus with diabetic chronic kidney disease: Secondary | ICD-10-CM | POA: Diagnosis not present

## 2021-12-01 DIAGNOSIS — I129 Hypertensive chronic kidney disease with stage 1 through stage 4 chronic kidney disease, or unspecified chronic kidney disease: Secondary | ICD-10-CM | POA: Diagnosis not present

## 2021-12-12 DIAGNOSIS — I10 Essential (primary) hypertension: Secondary | ICD-10-CM | POA: Diagnosis not present

## 2021-12-12 DIAGNOSIS — J0100 Acute maxillary sinusitis, unspecified: Secondary | ICD-10-CM | POA: Diagnosis not present

## 2021-12-12 DIAGNOSIS — Z794 Long term (current) use of insulin: Secondary | ICD-10-CM | POA: Diagnosis not present

## 2021-12-12 DIAGNOSIS — M1990 Unspecified osteoarthritis, unspecified site: Secondary | ICD-10-CM | POA: Diagnosis not present

## 2021-12-12 DIAGNOSIS — E108 Type 1 diabetes mellitus with unspecified complications: Secondary | ICD-10-CM | POA: Diagnosis not present

## 2021-12-12 DIAGNOSIS — G629 Polyneuropathy, unspecified: Secondary | ICD-10-CM | POA: Diagnosis not present

## 2021-12-12 DIAGNOSIS — Z940 Kidney transplant status: Secondary | ICD-10-CM | POA: Diagnosis not present

## 2021-12-12 DIAGNOSIS — E782 Mixed hyperlipidemia: Secondary | ICD-10-CM | POA: Diagnosis not present

## 2021-12-12 DIAGNOSIS — E559 Vitamin D deficiency, unspecified: Secondary | ICD-10-CM | POA: Diagnosis not present

## 2021-12-12 DIAGNOSIS — E663 Overweight: Secondary | ICD-10-CM | POA: Diagnosis not present

## 2021-12-12 DIAGNOSIS — E1122 Type 2 diabetes mellitus with diabetic chronic kidney disease: Secondary | ICD-10-CM | POA: Diagnosis not present

## 2021-12-28 DIAGNOSIS — Z940 Kidney transplant status: Secondary | ICD-10-CM | POA: Diagnosis not present

## 2022-01-04 DIAGNOSIS — R051 Acute cough: Secondary | ICD-10-CM | POA: Diagnosis not present

## 2022-01-04 DIAGNOSIS — J40 Bronchitis, not specified as acute or chronic: Secondary | ICD-10-CM | POA: Diagnosis not present

## 2022-01-11 DIAGNOSIS — E108 Type 1 diabetes mellitus with unspecified complications: Secondary | ICD-10-CM | POA: Diagnosis not present

## 2022-01-11 DIAGNOSIS — Z794 Long term (current) use of insulin: Secondary | ICD-10-CM | POA: Diagnosis not present

## 2022-01-16 DIAGNOSIS — Z940 Kidney transplant status: Secondary | ICD-10-CM | POA: Diagnosis not present

## 2022-01-26 DIAGNOSIS — Z940 Kidney transplant status: Secondary | ICD-10-CM | POA: Diagnosis not present

## 2022-01-26 DIAGNOSIS — H538 Other visual disturbances: Secondary | ICD-10-CM | POA: Diagnosis not present

## 2022-01-26 DIAGNOSIS — S0501XA Injury of conjunctiva and corneal abrasion without foreign body, right eye, initial encounter: Secondary | ICD-10-CM | POA: Diagnosis not present

## 2022-01-26 DIAGNOSIS — H04123 Dry eye syndrome of bilateral lacrimal glands: Secondary | ICD-10-CM | POA: Diagnosis not present

## 2022-01-26 DIAGNOSIS — S0502XD Injury of conjunctiva and corneal abrasion without foreign body, left eye, subsequent encounter: Secondary | ICD-10-CM | POA: Diagnosis not present

## 2022-01-26 DIAGNOSIS — E782 Mixed hyperlipidemia: Secondary | ICD-10-CM | POA: Diagnosis not present

## 2022-01-26 DIAGNOSIS — H18832 Recurrent erosion of cornea, left eye: Secondary | ICD-10-CM | POA: Diagnosis not present

## 2022-01-26 DIAGNOSIS — E1122 Type 2 diabetes mellitus with diabetic chronic kidney disease: Secondary | ICD-10-CM | POA: Diagnosis not present

## 2022-02-02 DIAGNOSIS — H04123 Dry eye syndrome of bilateral lacrimal glands: Secondary | ICD-10-CM | POA: Diagnosis not present

## 2022-02-02 DIAGNOSIS — H538 Other visual disturbances: Secondary | ICD-10-CM | POA: Diagnosis not present

## 2022-02-02 DIAGNOSIS — H18832 Recurrent erosion of cornea, left eye: Secondary | ICD-10-CM | POA: Diagnosis not present

## 2022-02-02 DIAGNOSIS — S0502XA Injury of conjunctiva and corneal abrasion without foreign body, left eye, initial encounter: Secondary | ICD-10-CM | POA: Diagnosis not present

## 2022-02-03 DIAGNOSIS — H04123 Dry eye syndrome of bilateral lacrimal glands: Secondary | ICD-10-CM | POA: Diagnosis not present

## 2022-02-03 DIAGNOSIS — H18832 Recurrent erosion of cornea, left eye: Secondary | ICD-10-CM | POA: Diagnosis not present

## 2022-02-03 DIAGNOSIS — H538 Other visual disturbances: Secondary | ICD-10-CM | POA: Diagnosis not present

## 2022-02-03 DIAGNOSIS — S0502XD Injury of conjunctiva and corneal abrasion without foreign body, left eye, subsequent encounter: Secondary | ICD-10-CM | POA: Diagnosis not present

## 2022-02-06 DIAGNOSIS — H18832 Recurrent erosion of cornea, left eye: Secondary | ICD-10-CM | POA: Diagnosis not present

## 2022-02-06 DIAGNOSIS — H18593 Other hereditary corneal dystrophies, bilateral: Secondary | ICD-10-CM | POA: Diagnosis not present

## 2022-02-06 DIAGNOSIS — H538 Other visual disturbances: Secondary | ICD-10-CM | POA: Diagnosis not present

## 2022-02-06 DIAGNOSIS — H04123 Dry eye syndrome of bilateral lacrimal glands: Secondary | ICD-10-CM | POA: Diagnosis not present

## 2022-02-06 DIAGNOSIS — S0502XD Injury of conjunctiva and corneal abrasion without foreign body, left eye, subsequent encounter: Secondary | ICD-10-CM | POA: Diagnosis not present

## 2022-02-10 DIAGNOSIS — H538 Other visual disturbances: Secondary | ICD-10-CM | POA: Diagnosis not present

## 2022-02-10 DIAGNOSIS — H18832 Recurrent erosion of cornea, left eye: Secondary | ICD-10-CM | POA: Diagnosis not present

## 2022-02-10 DIAGNOSIS — S0502XD Injury of conjunctiva and corneal abrasion without foreign body, left eye, subsequent encounter: Secondary | ICD-10-CM | POA: Diagnosis not present

## 2022-02-10 DIAGNOSIS — H04123 Dry eye syndrome of bilateral lacrimal glands: Secondary | ICD-10-CM | POA: Diagnosis not present

## 2022-02-10 DIAGNOSIS — E108 Type 1 diabetes mellitus with unspecified complications: Secondary | ICD-10-CM | POA: Diagnosis not present

## 2022-02-10 DIAGNOSIS — Z794 Long term (current) use of insulin: Secondary | ICD-10-CM | POA: Diagnosis not present

## 2022-02-10 DIAGNOSIS — H18593 Other hereditary corneal dystrophies, bilateral: Secondary | ICD-10-CM | POA: Diagnosis not present

## 2022-02-16 DIAGNOSIS — M1990 Unspecified osteoarthritis, unspecified site: Secondary | ICD-10-CM | POA: Diagnosis not present

## 2022-02-16 DIAGNOSIS — Z794 Long term (current) use of insulin: Secondary | ICD-10-CM | POA: Diagnosis not present

## 2022-02-16 DIAGNOSIS — Z0001 Encounter for general adult medical examination with abnormal findings: Secondary | ICD-10-CM | POA: Diagnosis not present

## 2022-02-16 DIAGNOSIS — Z940 Kidney transplant status: Secondary | ICD-10-CM | POA: Diagnosis not present

## 2022-02-16 DIAGNOSIS — G629 Polyneuropathy, unspecified: Secondary | ICD-10-CM | POA: Diagnosis not present

## 2022-02-16 DIAGNOSIS — I10 Essential (primary) hypertension: Secondary | ICD-10-CM | POA: Diagnosis not present

## 2022-02-16 DIAGNOSIS — E559 Vitamin D deficiency, unspecified: Secondary | ICD-10-CM | POA: Diagnosis not present

## 2022-02-16 DIAGNOSIS — E663 Overweight: Secondary | ICD-10-CM | POA: Diagnosis not present

## 2022-02-16 DIAGNOSIS — E782 Mixed hyperlipidemia: Secondary | ICD-10-CM | POA: Diagnosis not present

## 2022-02-16 DIAGNOSIS — E1122 Type 2 diabetes mellitus with diabetic chronic kidney disease: Secondary | ICD-10-CM | POA: Diagnosis not present

## 2022-03-01 DIAGNOSIS — E1122 Type 2 diabetes mellitus with diabetic chronic kidney disease: Secondary | ICD-10-CM | POA: Diagnosis not present

## 2022-03-01 DIAGNOSIS — Z940 Kidney transplant status: Secondary | ICD-10-CM | POA: Diagnosis not present

## 2022-03-07 DIAGNOSIS — I129 Hypertensive chronic kidney disease with stage 1 through stage 4 chronic kidney disease, or unspecified chronic kidney disease: Secondary | ICD-10-CM | POA: Diagnosis not present

## 2022-03-07 DIAGNOSIS — E1122 Type 2 diabetes mellitus with diabetic chronic kidney disease: Secondary | ICD-10-CM | POA: Diagnosis not present

## 2022-03-07 DIAGNOSIS — M3214 Glomerular disease in systemic lupus erythematosus: Secondary | ICD-10-CM | POA: Diagnosis not present

## 2022-03-07 DIAGNOSIS — Z940 Kidney transplant status: Secondary | ICD-10-CM | POA: Diagnosis not present

## 2022-03-07 DIAGNOSIS — Z79899 Other long term (current) drug therapy: Secondary | ICD-10-CM | POA: Diagnosis not present

## 2022-03-07 DIAGNOSIS — I2510 Atherosclerotic heart disease of native coronary artery without angina pectoris: Secondary | ICD-10-CM | POA: Diagnosis not present

## 2022-03-08 DIAGNOSIS — D84821 Immunodeficiency due to drugs: Secondary | ICD-10-CM | POA: Diagnosis not present

## 2022-03-08 DIAGNOSIS — I252 Old myocardial infarction: Secondary | ICD-10-CM | POA: Diagnosis not present

## 2022-03-08 DIAGNOSIS — I10 Essential (primary) hypertension: Secondary | ICD-10-CM | POA: Diagnosis not present

## 2022-03-08 DIAGNOSIS — Z7952 Long term (current) use of systemic steroids: Secondary | ICD-10-CM | POA: Diagnosis not present

## 2022-03-08 DIAGNOSIS — Z7902 Long term (current) use of antithrombotics/antiplatelets: Secondary | ICD-10-CM | POA: Diagnosis not present

## 2022-03-08 DIAGNOSIS — K219 Gastro-esophageal reflux disease without esophagitis: Secondary | ICD-10-CM | POA: Diagnosis not present

## 2022-03-08 DIAGNOSIS — M329 Systemic lupus erythematosus, unspecified: Secondary | ICD-10-CM | POA: Diagnosis not present

## 2022-03-08 DIAGNOSIS — I25119 Atherosclerotic heart disease of native coronary artery with unspecified angina pectoris: Secondary | ICD-10-CM | POA: Diagnosis not present

## 2022-03-08 DIAGNOSIS — Z880 Allergy status to penicillin: Secondary | ICD-10-CM | POA: Diagnosis not present

## 2022-03-08 DIAGNOSIS — R69 Illness, unspecified: Secondary | ICD-10-CM | POA: Diagnosis not present

## 2022-03-08 DIAGNOSIS — R0602 Shortness of breath: Secondary | ICD-10-CM | POA: Diagnosis not present

## 2022-03-08 DIAGNOSIS — F419 Anxiety disorder, unspecified: Secondary | ICD-10-CM | POA: Diagnosis not present

## 2022-03-08 DIAGNOSIS — E785 Hyperlipidemia, unspecified: Secondary | ICD-10-CM | POA: Diagnosis not present

## 2022-03-14 DIAGNOSIS — Z794 Long term (current) use of insulin: Secondary | ICD-10-CM | POA: Diagnosis not present

## 2022-03-14 DIAGNOSIS — E108 Type 1 diabetes mellitus with unspecified complications: Secondary | ICD-10-CM | POA: Diagnosis not present

## 2022-03-21 DIAGNOSIS — E559 Vitamin D deficiency, unspecified: Secondary | ICD-10-CM | POA: Diagnosis not present

## 2022-03-21 DIAGNOSIS — I10 Essential (primary) hypertension: Secondary | ICD-10-CM | POA: Diagnosis not present

## 2022-03-21 DIAGNOSIS — M1990 Unspecified osteoarthritis, unspecified site: Secondary | ICD-10-CM | POA: Diagnosis not present

## 2022-03-21 DIAGNOSIS — E782 Mixed hyperlipidemia: Secondary | ICD-10-CM | POA: Diagnosis not present

## 2022-03-21 DIAGNOSIS — E1122 Type 2 diabetes mellitus with diabetic chronic kidney disease: Secondary | ICD-10-CM | POA: Diagnosis not present

## 2022-03-21 DIAGNOSIS — E663 Overweight: Secondary | ICD-10-CM | POA: Diagnosis not present

## 2022-03-21 DIAGNOSIS — Z940 Kidney transplant status: Secondary | ICD-10-CM | POA: Diagnosis not present

## 2022-03-21 DIAGNOSIS — G629 Polyneuropathy, unspecified: Secondary | ICD-10-CM | POA: Diagnosis not present

## 2022-03-21 DIAGNOSIS — Z794 Long term (current) use of insulin: Secondary | ICD-10-CM | POA: Diagnosis not present

## 2022-03-21 DIAGNOSIS — R1013 Epigastric pain: Secondary | ICD-10-CM | POA: Diagnosis not present

## 2022-04-02 ENCOUNTER — Encounter: Payer: Self-pay | Admitting: Cardiovascular Disease

## 2022-04-02 NOTE — Progress Notes (Unsigned)
Cardiology Office Note:    Date:  04/05/2022   ID:  Lidia Collum Helm, DOB 07-10-1970, MRN [id number]  PCP:  Benito Mccreedy, MD  Cardiologist:  Mertie Moores, MD  Electrophysiologist:  None   Referring MD: Benito Mccreedy, MD   Problem list 1.  Coronary artery disease-status post stenting follow-up in [place] in 2017 2.  End-stage renal disease 3.  Hypertension 4.  Hyperlipidemia  Chief Complaint  Patient presents with   Coronary Artery Disease           Feb. 17, 2020    Linell Meldrum is a 52 y.o. female with a hx of end-stage renal disease, diabetes mellitus, hypertension, lupus who we are asked to see today by Dr. Jeanella Anton for further evaluation of chest discomfort.  She has had repeated episdoes of CP during dialysis .   Seems to be related to the volume and speed of the dialysis.   She has had them slow down dialysis and stop dialysis and the chest pains typically resolve at that point. Mid sterma,  Associated with dyspnea.  No radiation   Does not get any regular exercise  Watches her diet pretty well  Moved from [place] in   Has a hx of CAD with stenting  -she is had at least one stent placed.  She thinks he might of had balloon angioplasty of another vessel.  She thinks these CP are similar to when she was stented in the past.   June 07, 2020: Roberta is seen today for follow up of her CAD.  She has CKD and is on dialysis. She was having episodes of CP several months ago and saw [facility], [place] for eval and renal transplant pre op. Scheduled for cath Cath Dec. 17, 2021 showed severe CAD with tight stenosis in LCx/OM and serial 99%-70% stenosis in mid RCA She is s/p stenting in LCX/OM and the RCA No further episodes of CP .   She needs to be cleared for surgery for renal transplantation.  She called to discuss the requirements for renal transplantation and apparently she will be able to continue taking aspirin 81 mg a day.  They need her  off Plavix. We discussed the fact that the safest way to do this would be to wait until she has been on aspirin Plavix for 1 year which would mean that she would stop Plavix in mid December, 2022.  She is not having any episodes of chest pain.   I discussed the case with her transplant coordinator  Richmond Campbell Phone [phone] Fax [phone]  According to [name] she does not need to hold aspirin or Plavix prior to surgery.  They will just hold this medicines on the evening of surgery.  She is at low to moderate risk for her upcoming kidney transplant.  I have given the okay for her to hold the aspirin Plavix for 5 to 7 days following surgery to allow for adequate healing.  Jan. 24, 2023  Pippa is seen for follow up of her CAD, Has had her renal transplant. Developed atrial fib during her hospitalization , CHADS2VASC is 2 ( CAD, HTN)  Is back in [place] today  She still has occasional episodes of palpitations =- ? afib ( associated with faster HR and dyspnea)   No angina .   The hospital did an echo, and  myoview  [place] back doing her normal activities.  Feb. 7, 2024 Azure is seen for follow up of her CAD and  PAF Had renal transplant,  developed PAF during her hospitalization  14 day event monitor revealed on sinus rhythm  Lipids and other labs monitored by primary care.   Past Medical History:  Diagnosis Date   Acute diverticulitis 12/01/2017   Anemia    CAD in native artery    a. PCI to [place] in 2017 in [place]. b.  In 03/2018 she had a repeat cath with 80-90% mid/distal LCx s/p synergy DES, with residual RCA stenosis mg'd medically. c.01/2020 s/p orbital atherectomy/DES to the OM1 and OM2, residual RCA stenosis.   Diabetes mellitus without complication [facility])    Dialysis patient [facility])    Diverticulitis    ESRD (end stage renal disease) ([place])    GI bleeding    Hypertension    LGI bleed 12/16/2017   Lower GI bleed    Lupus (HCC)    Renal disorder    dialysis   UTI (urinary  tract infection) 12/01/2017    Past Surgical History:  Procedure Laterality Date   BIOPSY  10/24/2019   Procedure: BIOPSY;  Surgeon: Otis Brace, MD;  [facility]: [facility];  Service: Gastroenterology;;  EGD and COLON   CARDIAC SURGERY     cardiac shunt   COLONOSCOPY WITH PROPOFOL N/A 10/24/2019   Procedure: COLONOSCOPY WITH PROPOFOL;  Surgeon: Otis Brace, MD;  [facility]: [facility];  Service: Gastroenterology;  Laterality: N/A;   CORONARY ATHERECTOMY N/A 02/13/2020   Procedure: CORONARY ATHERECTOMY;  Surgeon: [name], MD;  Location: [place] [facility];  Service: Cardiovascular;  Laterality: N/A;   CORONARY STENT INTERVENTION N/A 04/19/2018   Procedure: CORONARY STENT INTERVENTION;  Surgeon: [name], MD;  Location: [place] [facility];  Service: Cardiovascular;  Laterality: N/A;   CORONARY STENT INTERVENTION N/A 02/13/2020   Procedure: CORONARY STENT INTERVENTION;  Surgeon: [name], MD;  Location: [place] [facility];  Service: Cardiovascular;  Laterality: N/A;   ESOPHAGOGASTRODUODENOSCOPY (EGD) WITH PROPOFOL N/A 10/24/2019   Procedure: ESOPHAGOGASTRODUODENOSCOPY (EGD) WITH PROPOFOL;  Surgeon: Otis Brace, MD;  [facility];  Service: Gastroenterology;  Laterality: N/A;   INCISION AND DRAINAGE PERIRECTAL ABSCESS N/A 07/09/2019   Procedure: IRRIGATION AND DEBRIDEMEN right buttock ABSCESS;  Surgeon: Kieth Brightly, Arta Bruce, MD;  Location: [place];  Service: General;  Laterality: N/A;   INTRAVASCULAR PRESSURE WIRE/FFR STUDY N/A 02/13/2020   Procedure: INTRAVASCULAR PRESSURE WIRE/FFR STUDY;  Surgeon: [name], MD;  Location: [place] [facility];  Service: Cardiovascular;  Laterality: N/A;   Kidney graft Left    LEFT HEART CATH AND CORONARY ANGIOGRAPHY N/A 04/19/2018   Procedure: LEFT HEART CATH AND CORONARY ANGIOGRAPHY;  Surgeon: [name], MD;  Location: [place] [facility];  Service: Cardiovascular;  Laterality: N/A;   LEFT HEART CATH  AND CORONARY ANGIOGRAPHY N/A 02/13/2020   Procedure: LEFT HEART CATH AND CORONARY ANGIOGRAPHY;  Surgeon: [name], MD;  Location: [place] [facility];  Service: Cardiovascular;  Laterality: N/A;   POLYPECTOMY  10/24/2019   Procedure: POLYPECTOMY;  Surgeon: Otis Brace, MD;  [facility];  Service: Gastroenterology;;    Current Medications: Current Meds  Medication Sig   acetaminophen (TYLENOL) 325 MG tablet Take 2 tablets (650 mg total) by mouth every 4 (four) hours as needed for fever, headache or mild pain.   albuterol (VENTOLIN HFA) 108 (90 Base) MCG/ACT inhaler 2 Puff(s) By Mouth Every 6 Hours   aspirin EC 81 MG tablet Take 1 tablet (81 mg total) by mouth daily.   atorvastatin (LIPITOR) 40 MG tablet Take 1  tablet by mouth once daily   clopidogrel (PLAVIX) 75 MG tablet Take 1 tablet (75 mg total) by mouth daily.   famotidine (PEPCID) 20 MG tablet Take 20 mg by mouth daily.   insulin aspart (NOVOLOG) 100 UNIT/ML FlexPen Inject into the skin. 15 units breakfast, 15units lunch, 15 units dinner   Insulin Glargine (BASAGLAR KWIKPEN) 100 UNIT/ML 15 Unit(s) SUB-Q Every Night   insulin NPH-regular Human (70-30) 100 UNIT/ML injection Inject 30-40 Units into the skin See admin instructions. Inject 30 units in the morning and 30 units at bedtime.   metoprolol tartrate (LOPRESSOR) 25 MG tablet Take 1 tablet (25 mg total) by mouth 2 (two) times daily.   moxifloxacin (VIGAMOX) 0.5 % ophthalmic solution Place 1 drop into the right eye 4 (four) times daily.   multivitamin (RENA-VIT) TABS tablet Take 1 tablet by mouth daily.   mycophenolate (CELLCEPT) 250 MG capsule Take by mouth.   nitroGLYCERIN (NITROSTAT) 0.4 MG SL tablet Place 1 tablet (0.4 mg total) under the tongue every 5 (five) minutes as needed.   nystatin (MYCOSTATIN) 100000 UNIT/ML suspension Take by mouth.   predniSONE (DELTASONE) 5 MG tablet Take 5 mg by mouth daily with breakfast.   propranolol (INDERAL) 10 MG tablet  Take 1 tablet (10 mg total) by mouth 4 (four) times daily as needed (palpitations).   sulfamethoxazole-trimethoprim (BACTRIM DS) 800-160 MG tablet Take by mouth.   tacrolimus (PROGRAF) 1 MG capsule Take by mouth.     Allergies:   Penicillins   Social History   Socioeconomic History   Marital status: Single    Spouse name: Not on file   Number of children: 1   Years of education: Not on file   Highest education level: Not on file  Occupational History   Occupation: disabled  Tobacco Use   Smoking status: Never   Smokeless tobacco: Never  Vaping Use   Vaping Use: Never used  Substance and Sexual Activity   Alcohol use: Never   Drug use: Never   Sexual activity: Not Currently    Partners: Male    Birth control/protection: Condom  Other Topics Concern   Not on file  Social History Narrative   Not on file   Social Determinants of Health   Financial Resource Strain: Not on file  Food Insecurity: Not on file  Transportation Needs: Not on file  Physical Activity: Not on file  Stress: Not on file  Social Connections: Not on file     Family History: The patient's family history includes Healthy in her brother and brother; Hypertension in her mother; Other in her father. There is no history of Colon cancer, Esophageal cancer, or Breast cancer.  ROS:   Please see the history of present illness.     All other systems reviewed and are negative.  EKGs/Labs/Other Studies Reviewed:    The following studies were reviewed today:   EKG:       Recent Labs: No results found for requested labs within last 365 days.  Recent Lipid Panel    Component Value Date/Time   CHOL 120 04/15/2018 0948   TRIG 92 04/15/2018 0948   HDL 59 04/15/2018 0948   CHOLHDL 2.0 04/15/2018 0948   LDLCALC 43 04/15/2018 0948    Physical Exam:    Physical Exam: Blood pressure 108/60, pulse 70, height '5\' 1"'$  (1.549 m), weight 136 lb 3.2 oz (61.8 kg), last menstrual period 02/16/2018, SpO2 93  %.       GEN:  Well nourished,  well developed in no acute distress HEENT: Normal NECK: No JVD; No carotid bruits LYMPHATICS: No lymphadenopathy CARDIAC: RRR with occasional premature beats . Clinically c/w PACs  RESPIRATORY:  Clear to auscultation without rales, wheezing or rhonchi  ABDOMEN: Soft, non-tender, non-distended MUSCULOSKELETAL:  No edema; No deformity  SKIN: Warm and dry NEUROLOGIC:  Alert and oriented x 3    ASSESSMENT:    1. Palpitations   2. Mixed hyperlipidemia     PLAN:      1.  Coronary artery disease:  no angina  Her primary MD is managing her lipids .  Her goal LDL is 50-70 We do not have recent labs    2.  PAF:   14-day event monitor did not did not reveal any evidence of paroxysmal A-fib.  She does have premature atrial contractions.   3. ESRD -she is status post renal transplant.  Continue current medications.   Will see her again in 6 months for follow-up visit with me or an APP.         Medication Adjustments/Labs and Tests Ordered: Current medicines are reviewed at length with the patient today.  Concerns regarding medicines are outlined above.  Orders Placed This Encounter  Procedures   ECHOCARDIOGRAM COMPLETE   No orders of the defined types were placed in this encounter.   Patient Instructions  Medication Instructions:  Your physician recommends that you continue on your current medications as directed. Please refer to the Current Medication list given to you today.  *If you need a refill on your cardiac medications before your next appointment, please call your pharmacy*   Lab Work: NONE If you have labs (blood work) drawn today and your tests are completely normal, you will receive your results only by: [place] (if you have MyChart) OR A paper copy in the mail If you have any lab test that is abnormal or we need to change your treatment, we will call you to review the  results.   Testing/Procedures: ECHO Your physician has requested that you have an echocardiogram. Echocardiography is a painless test that uses sound waves to create images of your heart. It provides your doctor with information about the size and shape of your heart and how well your heart's chambers and valves are working. This procedure takes approximately one hour. There are no restrictions for this procedure. Please do NOT wear cologne, perfume, aftershave, or lotions (deodorant is allowed). Please arrive 15 minutes prior to your appointment time.  Follow-Up: At [facility], you and your health needs are our priority.  As part of our continuing mission to provide you with exceptional heart care, we have created designated Provider Care Teams.  These Care Teams include your primary Cardiologist (physician) and Advanced Practice Providers (APPs -  Physician Assistants and Nurse Practitioners) who all work together to provide you with the care you need, when you need it.  Your next appointment:   6 month(s)  Provider:   Mertie Moores, MD  or Christen Bame, NP      Signed, Mertie Moores, MD  04/05/2022 9:33 AM    [place]

## 2022-04-05 ENCOUNTER — Ambulatory Visit: Payer: Medicare HMO | Attending: Cardiovascular Disease | Admitting: Cardiovascular Disease

## 2022-04-05 ENCOUNTER — Encounter: Payer: Self-pay | Admitting: Cardiovascular Disease

## 2022-04-05 VITALS — BP 108/60 | HR 70 | Ht 61.0 in | Wt 136.2 lb

## 2022-04-05 DIAGNOSIS — E782 Mixed hyperlipidemia: Secondary | ICD-10-CM

## 2022-04-05 DIAGNOSIS — R002 Palpitations: Secondary | ICD-10-CM | POA: Diagnosis not present

## 2022-04-05 NOTE — Patient Instructions (Signed)
Medication Instructions:  Your physician recommends that you continue on your current medications as directed. Please refer to the Current Medication list given to you today.  *If you need a refill on your cardiac medications before your next appointment, please call your pharmacy*   Lab Work: NONE If you have labs (blood work) drawn today and your tests are completely normal, you will receive your results only by: [place] (if you have MyChart) OR A paper copy in the mail If you have any lab test that is abnormal or we need to change your treatment, we will call you to review the results.   Testing/Procedures: ECHO Your physician has requested that you have an echocardiogram. Echocardiography is a painless test that uses sound waves to create images of your heart. It provides your doctor with information about the size and shape of your heart and how well your heart's chambers and valves are working. This procedure takes approximately one hour. There are no restrictions for this procedure. Please do NOT wear cologne, perfume, aftershave, or lotions (deodorant is allowed). Please arrive 15 minutes prior to your appointment time.  Follow-Up: At [facility], you and your health needs are our priority.  As part of our continuing mission to provide you with exceptional heart care, we have created designated Provider Care Teams.  These Care Teams include your primary Cardiologist (physician) and Advanced Practice Providers (APPs -  Physician Assistants and Nurse Practitioners) who all work together to provide you with the care you need, when you need it.  Your next appointment:   6 month(s)  Provider:   Mertie Moores, MD  or Christen Bame, NP

## 2022-04-06 DIAGNOSIS — N390 Urinary tract infection, site not specified: Secondary | ICD-10-CM | POA: Diagnosis not present

## 2022-04-06 DIAGNOSIS — Z940 Kidney transplant status: Secondary | ICD-10-CM | POA: Diagnosis not present

## 2022-04-13 DIAGNOSIS — Z794 Long term (current) use of insulin: Secondary | ICD-10-CM | POA: Diagnosis not present

## 2022-04-13 DIAGNOSIS — E108 Type 1 diabetes mellitus with unspecified complications: Secondary | ICD-10-CM | POA: Diagnosis not present

## 2022-05-01 ENCOUNTER — Telehealth (HOSPITAL_COMMUNITY): Payer: Self-pay | Admitting: Cardiovascular Disease

## 2022-05-01 NOTE — Telephone Encounter (Signed)
Patient cancelled echocardiogram for reason below:  05/01/22 pt cancelled due to out of town with mother sick and will call us back when she is back in town to reschedule. [facility]   Order will be removed from the echo WQ and when patient calls back to reschedule we can reinstate the order.

## 2022-05-04 ENCOUNTER — Other Ambulatory Visit (HOSPITAL_COMMUNITY): Payer: Medicare HMO

## 2022-05-11 DIAGNOSIS — N390 Urinary tract infection, site not specified: Secondary | ICD-10-CM | POA: Diagnosis not present

## 2022-05-11 DIAGNOSIS — Z940 Kidney transplant status: Secondary | ICD-10-CM | POA: Diagnosis not present

## 2022-05-13 DIAGNOSIS — Z794 Long term (current) use of insulin: Secondary | ICD-10-CM | POA: Diagnosis not present

## 2022-05-13 DIAGNOSIS — E108 Type 1 diabetes mellitus with unspecified complications: Secondary | ICD-10-CM | POA: Diagnosis not present

## 2022-06-13 DIAGNOSIS — Z794 Long term (current) use of insulin: Secondary | ICD-10-CM | POA: Diagnosis not present

## 2022-06-13 DIAGNOSIS — E108 Type 1 diabetes mellitus with unspecified complications: Secondary | ICD-10-CM | POA: Diagnosis not present

## 2022-07-19 DIAGNOSIS — Z940 Kidney transplant status: Secondary | ICD-10-CM | POA: Diagnosis not present

## 2022-08-16 DIAGNOSIS — Z940 Kidney transplant status: Secondary | ICD-10-CM | POA: Diagnosis not present

## 2022-08-16 DIAGNOSIS — E1122 Type 2 diabetes mellitus with diabetic chronic kidney disease: Secondary | ICD-10-CM | POA: Diagnosis not present

## 2022-08-23 DIAGNOSIS — Z940 Kidney transplant status: Secondary | ICD-10-CM | POA: Diagnosis not present

## 2022-09-04 DIAGNOSIS — E785 Hyperlipidemia, unspecified: Secondary | ICD-10-CM | POA: Diagnosis not present

## 2022-09-04 DIAGNOSIS — I10 Essential (primary) hypertension: Secondary | ICD-10-CM | POA: Diagnosis not present

## 2022-09-19 DIAGNOSIS — E782 Mixed hyperlipidemia: Secondary | ICD-10-CM | POA: Diagnosis not present

## 2022-09-19 DIAGNOSIS — G629 Polyneuropathy, unspecified: Secondary | ICD-10-CM | POA: Diagnosis not present

## 2022-09-19 DIAGNOSIS — R1013 Epigastric pain: Secondary | ICD-10-CM | POA: Diagnosis not present

## 2022-09-19 DIAGNOSIS — Z940 Kidney transplant status: Secondary | ICD-10-CM | POA: Diagnosis not present

## 2022-09-19 DIAGNOSIS — E559 Vitamin D deficiency, unspecified: Secondary | ICD-10-CM | POA: Diagnosis not present

## 2022-09-19 DIAGNOSIS — E1122 Type 2 diabetes mellitus with diabetic chronic kidney disease: Secondary | ICD-10-CM | POA: Diagnosis not present

## 2022-09-19 DIAGNOSIS — E663 Overweight: Secondary | ICD-10-CM | POA: Diagnosis not present

## 2022-09-19 DIAGNOSIS — Z794 Long term (current) use of insulin: Secondary | ICD-10-CM | POA: Diagnosis not present

## 2022-09-19 DIAGNOSIS — I10 Essential (primary) hypertension: Secondary | ICD-10-CM | POA: Diagnosis not present

## 2022-09-19 DIAGNOSIS — M1990 Unspecified osteoarthritis, unspecified site: Secondary | ICD-10-CM | POA: Diagnosis not present

## 2022-09-25 ENCOUNTER — Ambulatory Visit (HOSPITAL_COMMUNITY): Payer: Medicare HMO | Attending: Cardiovascular Disease

## 2022-09-25 DIAGNOSIS — N189 Chronic kidney disease, unspecified: Secondary | ICD-10-CM | POA: Insufficient documentation

## 2022-09-25 DIAGNOSIS — E785 Hyperlipidemia, unspecified: Secondary | ICD-10-CM | POA: Insufficient documentation

## 2022-09-25 DIAGNOSIS — E782 Mixed hyperlipidemia: Secondary | ICD-10-CM

## 2022-09-25 DIAGNOSIS — I2510 Atherosclerotic heart disease of native coronary artery without angina pectoris: Secondary | ICD-10-CM | POA: Insufficient documentation

## 2022-09-25 DIAGNOSIS — I10 Essential (primary) hypertension: Secondary | ICD-10-CM | POA: Diagnosis not present

## 2022-09-25 DIAGNOSIS — I129 Hypertensive chronic kidney disease with stage 1 through stage 4 chronic kidney disease, or unspecified chronic kidney disease: Secondary | ICD-10-CM | POA: Diagnosis not present

## 2022-09-25 DIAGNOSIS — I517 Cardiomegaly: Secondary | ICD-10-CM | POA: Diagnosis not present

## 2022-09-25 DIAGNOSIS — R002 Palpitations: Secondary | ICD-10-CM | POA: Diagnosis not present

## 2022-09-25 LAB — ECHOCARDIOGRAM COMPLETE
Area-P 1/2: 5.06 cm2
S' Lateral: 2.5 cm

## 2022-10-09 ENCOUNTER — Ambulatory Visit (HOSPITAL_BASED_OUTPATIENT_CLINIC_OR_DEPARTMENT_OTHER): Payer: Medicare HMO | Admitting: Internal Medicine

## 2022-11-14 DIAGNOSIS — Z940 Kidney transplant status: Secondary | ICD-10-CM | POA: Diagnosis not present

## 2023-02-07 DIAGNOSIS — Z940 Kidney transplant status: Secondary | ICD-10-CM | POA: Diagnosis not present

## 2023-03-01 DIAGNOSIS — M1990 Unspecified osteoarthritis, unspecified site: Secondary | ICD-10-CM | POA: Diagnosis not present

## 2023-03-01 DIAGNOSIS — I10 Essential (primary) hypertension: Secondary | ICD-10-CM | POA: Diagnosis not present

## 2023-03-01 DIAGNOSIS — E782 Mixed hyperlipidemia: Secondary | ICD-10-CM | POA: Diagnosis not present

## 2023-03-01 DIAGNOSIS — Z794 Long term (current) use of insulin: Secondary | ICD-10-CM | POA: Diagnosis not present

## 2023-03-01 DIAGNOSIS — E663 Overweight: Secondary | ICD-10-CM | POA: Diagnosis not present

## 2023-03-01 DIAGNOSIS — Z940 Kidney transplant status: Secondary | ICD-10-CM | POA: Diagnosis not present

## 2023-03-01 DIAGNOSIS — E1122 Type 2 diabetes mellitus with diabetic chronic kidney disease: Secondary | ICD-10-CM | POA: Diagnosis not present

## 2023-03-01 DIAGNOSIS — R1013 Epigastric pain: Secondary | ICD-10-CM | POA: Diagnosis not present

## 2023-03-01 DIAGNOSIS — G629 Polyneuropathy, unspecified: Secondary | ICD-10-CM | POA: Diagnosis not present

## 2023-03-01 DIAGNOSIS — J0100 Acute maxillary sinusitis, unspecified: Secondary | ICD-10-CM | POA: Diagnosis not present

## 2023-03-01 DIAGNOSIS — E559 Vitamin D deficiency, unspecified: Secondary | ICD-10-CM | POA: Diagnosis not present

## 2023-03-29 DIAGNOSIS — Z79899 Other long term (current) drug therapy: Secondary | ICD-10-CM | POA: Diagnosis not present

## 2023-03-29 DIAGNOSIS — Z940 Kidney transplant status: Secondary | ICD-10-CM | POA: Diagnosis not present

## 2023-04-26 DIAGNOSIS — Z940 Kidney transplant status: Secondary | ICD-10-CM | POA: Diagnosis not present

## 2023-05-08 DIAGNOSIS — R079 Chest pain, unspecified: Secondary | ICD-10-CM | POA: Diagnosis not present

## 2023-05-09 DIAGNOSIS — E782 Mixed hyperlipidemia: Secondary | ICD-10-CM | POA: Diagnosis not present

## 2023-05-09 DIAGNOSIS — I252 Old myocardial infarction: Secondary | ICD-10-CM | POA: Diagnosis not present

## 2023-05-09 DIAGNOSIS — R5383 Other fatigue: Secondary | ICD-10-CM | POA: Diagnosis not present

## 2023-05-09 DIAGNOSIS — E119 Type 2 diabetes mellitus without complications: Secondary | ICD-10-CM | POA: Diagnosis not present

## 2023-05-09 DIAGNOSIS — I2510 Atherosclerotic heart disease of native coronary artery without angina pectoris: Secondary | ICD-10-CM | POA: Diagnosis not present

## 2023-05-09 DIAGNOSIS — R079 Chest pain, unspecified: Secondary | ICD-10-CM | POA: Diagnosis not present

## 2023-05-09 DIAGNOSIS — I10 Essential (primary) hypertension: Secondary | ICD-10-CM | POA: Diagnosis not present

## 2023-05-09 DIAGNOSIS — R0602 Shortness of breath: Secondary | ICD-10-CM | POA: Diagnosis not present

## 2023-05-10 DIAGNOSIS — R079 Chest pain, unspecified: Secondary | ICD-10-CM | POA: Diagnosis not present

## 2023-05-10 DIAGNOSIS — R0602 Shortness of breath: Secondary | ICD-10-CM | POA: Diagnosis not present

## 2023-05-10 DIAGNOSIS — R5383 Other fatigue: Secondary | ICD-10-CM | POA: Diagnosis not present

## 2023-05-16 DIAGNOSIS — Z940 Kidney transplant status: Secondary | ICD-10-CM | POA: Diagnosis not present

## 2023-05-24 DIAGNOSIS — Z940 Kidney transplant status: Secondary | ICD-10-CM | POA: Diagnosis not present

## 2023-05-24 DIAGNOSIS — E1122 Type 2 diabetes mellitus with diabetic chronic kidney disease: Secondary | ICD-10-CM | POA: Diagnosis not present

## 2023-05-24 DIAGNOSIS — I2510 Atherosclerotic heart disease of native coronary artery without angina pectoris: Secondary | ICD-10-CM | POA: Diagnosis not present

## 2023-05-24 DIAGNOSIS — M3214 Glomerular disease in systemic lupus erythematosus: Secondary | ICD-10-CM | POA: Diagnosis not present

## 2023-05-24 DIAGNOSIS — I129 Hypertensive chronic kidney disease with stage 1 through stage 4 chronic kidney disease, or unspecified chronic kidney disease: Secondary | ICD-10-CM | POA: Diagnosis not present

## 2023-05-24 DIAGNOSIS — Z79899 Other long term (current) drug therapy: Secondary | ICD-10-CM | POA: Diagnosis not present

## 2023-05-24 NOTE — Progress Notes (Unsigned)
 Cardiology Office Note    Patient Name: Paula Bass Date of Encounter: 05/24/2023  Primary Care Provider:  Jackie Plum, MD Primary Cardiologist:  Kristeen Miss, MD Primary Electrophysiologist: None   Past Medical History    Past Medical History:  Diagnosis Date   Acute diverticulitis 12/01/2017   Anemia    CAD in native artery    a. PCI to pRCA in 2017 in [place]. b.  In 03/2018 she had a repeat cath with 80-90% mid/distal LCx s/p synergy DES, with residual RCA stenosis mg'd medically. c.01/2020 s/p orbital atherectomy/DES to the OM1 and OM2, residual RCA stenosis.   Diabetes mellitus without complication [facility])    Dialysis patient [facility])    Diverticulitis    ESRD (end stage renal disease) (HCC)    GI bleeding    Hypertension    LGI bleed 12/16/2017   Lower GI bleed    Lupus (HCC)    Renal disorder    dialysis   UTI (urinary tract infection) 12/01/2017    History of Present Illness  Paula Bass is a 54 y.o. female with a PMH of CAD s/p prior PCI in [place] 2017 and repeat LHC 03/2018 with distal left circumflex 85%, OM1 40%, OM2 50%, P RCA stent patent with mid RCA 90% treated medically and PCI/DES to mid/distal LCx, and LHC 02/13/2020 with orbital arthrectomy/DES to OM1 and OM 2, ESRD, DM type II, HTN, HLD, systemic lupus erythematosus, GI bleed, PAF who presents today for 1 year follow-up.  Paula Bass was seen initially in 2020 after relocating from [place].  She had a prior history of PCI/DES to RCA completed at [facility] in [place] in 2017.  She was seen by Dr. Elease Hashimoto to establish care in 03/2018 and had complaint of chest pain that was similar to previous discomfort in 2017.  She underwent LHC that demonstrated two-vessel CAD with 80-90% mid distal LCx/OM disease and 90% mid RCA disease.  She had DES x 1 placed to distal LCx and medical therapy recommended for mid RCA disease with patent stent with plan for orbital arthrectomy and  if angina is refractory.  She had decrease in hemoglobin during hospitalization and required 2 units of PRBC.  CT scan was negative for retroperitoneal hematoma.  She was seen in follow-up on 04/2018 with ongoing angina and metoprolol was increased to 25 mg/day and Imdur 15 mg was added.  She was seen in follow-up by Dr. Elease Hashimoto on 05/2018 and reported improvement to chest discomfort.  She was seen in follow-up on 02/09/2020 for clearance prior to renal transplant in [place] and noted unstable angina similar to her previous discomfort in 2017.  She underwent repeat LHC on 02/13/2020 with orbital arthrectomy/DES to OM1 and OM 2 and ongoing medical therapy pursued for RCA residual disease.  She was seen in follow-up by Dr. Elease Hashimoto on 05/2020 and reported no residual episodes of CP.  She underwent successful renal transplant on 10/01/2020 and developed PAF postprocedure was not placed on AC.  She wore event monitor that showed sinus rhythm and was started on metoprolol.  She was last seen by Dr. Elease Hashimoto on 04/05/22 and reported no chest pain with EKG showing PACs and advised to follow-up in 6 months.  Per chart review patient returned to [place] to help care for her mother and was seen in the ED on 05/09/2023 with complaint of left-sided chest pain x 5 days that started as a pressure sensation to sharp  discomfort.  She also reported fatigue and shortness of breath with discomfort feeling similar to her previous PCI's.  EKG was completed showing sinus rhythm and 2D echo from 08/2022 showed EF of 65-70% with trivial MVR.  Troponins were negative and labs stable with no further workup at that time and advisement to follow-up with cardiologist in [place].  Paula Bass presents today for follow-up of chest pain. She experiences chest pain similar to previous episodes when blockages were found, described as both external and internal with sharp sensations.  Pain and associated symptoms include left arm numbness, fatigue,  and frequent burping, similar to those experienced prior to previous stent placements. Recently, she was seen in the emergency department in [place] due to these symptoms. Tests, including an EKG, were performed and returned normal. Despite this, she continues to experience symptoms, particularly when under stress, such as caring for her sick mother. She has not taken nitroglycerin recently as her supply is expired. Her current medications include metoprolol succinate 50 mg once daily, which she started after her last visit. She has not been on isosorbide recently, although she recalls taking it in the past. She feels tired and less energetic, with occasional difficulty catching her breath, but has not engaged in vigorous exercise recently. She has a history of atrial fibrillation post-transplant, which has not recurred according to her knowledge. However, she describes feeling anxious and experiencing palpitations, which she associates with her previous episodes of atrial fibrillation. She is concerned about these symptoms recurring, especially under stress. Patient denies chest pain, palpitations, dyspnea, PND, orthopnea, nausea, vomiting, dizziness, syncope, edema, weight gain, or early satiety.  Discussed the use of AI scribe software for clinical note transcription with the patient, who gave verbal consent to proceed.  History of Present Illness   Review of Systems  Please see the history of present illness.    All other systems reviewed and are otherwise negative except as noted above.  Physical Exam    Wt Readings from Last 3 Encounters:  04/05/22 136 lb 3.2 oz (61.8 kg)  10/25/21 137 lb 12.8 oz (62.5 kg)  07/22/21 138 lb 11.2 oz (62.9 kg)   ZO:XWRUE were no vitals filed for this visit.,There is no height or weight on file to calculate BMI. GEN: Well nourished, well developed in no acute distress Neck: No JVD; No carotid bruits Pulmonary: Clear to auscultation without rales, wheezing  or rhonchi  Cardiovascular: Normal rate. Regular rhythm. Normal S1. Normal S2.   Murmurs: There is no murmur.  ABDOMEN: Soft, non-tender, non-distended EXTREMITIES:  No edema; No deformity   EKG/LABS/ Recent Cardiac Studies   ECG personally reviewed by me today -sinus rhythm with rate of 76 bpm and no acute changes consistent with previous EKG.  Lab Results  Component Value Date   WBC 6.7 03/05/2020   HGB 11.3 03/05/2020   HCT 33.2 (L) 03/05/2020   MCV 92 03/05/2020   PLT 175 03/05/2020   Lab Results  Component Value Date   CREATININE 1.22 (H) 03/22/2021   BUN 19 03/22/2021   NA 137 03/22/2021   K 4.1 03/22/2021   CL 102 03/22/2021   CO2 20 03/22/2021   Lab Results  Component Value Date   CHOL 120 04/15/2018   HDL 59 04/15/2018   LDLCALC 43 04/15/2018   TRIG 92 04/15/2018   CHOLHDL 2.0 04/15/2018    Lab Results  Component Value Date   HGBA1C 7.7 (H) 07/08/2019   Assessment & Plan  1.  Chest pain: -Patient recently seen in the ED for complaint of chest pain similar to previous discomfort. -Today patient denies any chest discomfort but notes few episodes of discomfort since being home from [place]. - Order nitroglycerin tablets for as-needed use. -Patient has elected to monitor symptoms and use nitroglycerin to assess if pain is cardiac-related. - Consider heart catheterization if symptoms persist or worsen. - Consider isosorbide mononitrate if symptoms do not improve with current management.  2.  History of CAD: -s/p multiple PCI with last 01/2020 treated with orbital arthrectomy/PCI and DES to OM1 and OM 2 Discussed potential need for further intervention if symptoms persist. - Continue metoprolol succinate 50 mg daily. - Discontinue metoprolol tartrate. -Continue current GDMT with ASA 81 mg, Lipitor 40 mg, Plavix 75 mg, Toprol-XL 75 mg -In Nitrostat 0.4 mg added -Instructions on taking Nitrostat and advised to seek care in the ED if pain is refractory to  medication.  3.  Hyperlipidemia: -Patient's last LDL cholesterol was 58 -Continue Lipitor 40 mg daily  4.  History of PAF: -s/p renal transplant and currently reports no known recurrence of tachycardia. Symptoms may suggest paroxysmal atrial fibrillation. EKG shows normal rhythm. Discussed use of a 14-day event monitor. - Order 14-day event monitor to assess for paroxysmal atrial fibrillation. - Mail event monitor to her home for use during travel.  5.  Essential hypertension: -Patient blood pressure today was 108/72 -Continue Toprol-XL 50 mg daily  6.  History of ESRD s/p transplant -Patient underwent renal transplant in 2022 -Follow-up per nephrology.  Disposition: Follow-up with Kristeen Miss, MD or APP in 4 months    Signed, Napoleon Form, Leodis Rains, NP 05/24/2023, 11:34 AM [facility]

## 2023-05-25 ENCOUNTER — Encounter: Payer: Self-pay | Admitting: Nurse Practitioner

## 2023-05-25 ENCOUNTER — Ambulatory Visit

## 2023-05-25 ENCOUNTER — Ambulatory Visit: Attending: Internal Medicine | Admitting: Nurse Practitioner

## 2023-05-25 VITALS — BP 108/72 | HR 76 | Ht 62.0 in | Wt 148.2 lb

## 2023-05-25 DIAGNOSIS — I2510 Atherosclerotic heart disease of native coronary artery without angina pectoris: Secondary | ICD-10-CM | POA: Diagnosis not present

## 2023-05-25 DIAGNOSIS — Z940 Kidney transplant status: Secondary | ICD-10-CM

## 2023-05-25 DIAGNOSIS — I10 Essential (primary) hypertension: Secondary | ICD-10-CM

## 2023-05-25 DIAGNOSIS — R079 Chest pain, unspecified: Secondary | ICD-10-CM

## 2023-05-25 DIAGNOSIS — E785 Hyperlipidemia, unspecified: Secondary | ICD-10-CM

## 2023-05-25 DIAGNOSIS — I480 Paroxysmal atrial fibrillation: Secondary | ICD-10-CM

## 2023-05-25 MED ORDER — METOPROLOL SUCCINATE ER 50 MG PO TB24: 50.0000 mg | ORAL_TABLET | Freq: Every day | ORAL | 1 refills | Status: DC

## 2023-05-25 MED ORDER — NITROGLYCERIN 0.4 MG SL SUBL: 0.4000 mg | SUBLINGUAL_TABLET | SUBLINGUAL | 2 refills | Status: AC | PRN

## 2023-05-25 NOTE — Progress Notes (Unsigned)
Enrolled patient for a 14 day Zio XT monitor to be mailed to patients home  [place] to read

## 2023-05-25 NOTE — Patient Instructions (Signed)
 Medication Instructions:  STOP Metoprolol tartrate START Toprol XL (Metoprolol Succinate) 50mg  Take 1 tablet once a day  *If you need a refill on your cardiac medications before your next appointment, please call your pharmacy*  Lab Work: None ordered If you have labs (blood work) drawn today and your tests are completely normal, you will receive your results only by: MyChart Message (if you have MyChart) OR A paper copy in the mail If you have any lab test that is abnormal or we need to change your treatment, we will call you to review the results.  Testing/Procedures: Christena Deem- Long Term Monitor Instructions  Your physician has requested you wear a ZIO patch monitor for 14 days.  This is a single patch monitor. [employer] supplies one patch monitor per enrollment. Additional stickers are not available. Please do not apply patch if you will be having a Nuclear Stress Test,  Echocardiogram, Cardiac CT, MRI, or Chest Xray during the period you would be wearing the  monitor. The patch cannot be worn during these tests. You cannot remove and re-apply the  ZIO XT patch monitor.  Your ZIO patch monitor will be mailed 3 day USPS to your address on file. It may take 3-5 days  to receive your monitor after you have been enrolled.  Once you have received your monitor, please review the enclosed instructions. Your monitor  has already been registered assigning a specific monitor serial # to you.  Billing and Patient Assistance Program Information  We have supplied [employer] with any of your insurance information on file for billing purposes. [employer] offers a sliding scale Patient Assistance Program for patients that do not have  insurance, or whose insurance does not completely cover the cost of the ZIO monitor.  You must apply for the Patient Assistance Program to qualify for this discounted rate.  To apply, please call [employer] at [phone], select option 4, select option 2, ask to apply for   Patient Assistance Program. Meredeth Ide will ask your household income, and how many people  are in your household. They will quote your out-of-pocket cost based on that information.  [employer] will also be able to set up a 19-month, interest-free payment plan if needed.  Applying the monitor   Shave hair from upper left chest.  Hold abrader disc by orange tab. Rub abrader in 40 strokes over the upper left chest as  indicated in your monitor instructions.  Clean area with 4 enclosed alcohol pads. Let dry.  Apply patch as indicated in monitor instructions. Patch will be placed under collarbone on left  side of chest with arrow pointing upward.  Rub patch adhesive wings for 2 minutes. Remove white label marked "1". Remove the white  label marked "2". Rub patch adhesive wings for 2 additional minutes.  While looking in a mirror, press and release button in center of patch. A small green light will  flash 3-4 times. This will be your only indicator that the monitor has been turned on.  Do not shower for the first 24 hours. You may shower after the first 24 hours.  Press the button if you feel a symptom. You will hear a small click. Record Date, Time and  Symptom in the Patient Logbook.  When you are ready to remove the patch, follow instructions on the last 2 pages of Patient  Logbook. Stick patch monitor onto the last page of Patient Logbook.  Place Patient Logbook in the blue and white box. Use locking tab on  box and tape box closed  securely. The blue and white box has prepaid postage on it. Please place it in the mailbox as  soon as possible. Your physician should have your test results approximately 7 days after the  monitor has been mailed back to [facility]  Call [facility] Customer Care at [phone] if you have questions regarding  your ZIO XT patch monitor. Call them immediately if you see an orange light blinking on your  monitor.  If your monitor falls off in less than 4  days, contact our Monitor department at [phone].  If your monitor becomes loose or falls off after 4 days call [employer] at [phone] for  suggestions on securing your monitor   Follow-Up: At [facility], you and your health needs are our priority.  As part of our continuing mission to provide you with exceptional heart care, our providers are all part of one team.  This team includes your primary Cardiologist (physician) and Advanced Practice Providers or APPs (Physician Assistants and Nurse Practitioners) who all work together to provide you with the care you need, when you need it.  Your next appointment:   4 month(s)  Provider:   Robin Searing, NP     Then, Thomasene Ripple, MD will plan to see you again in 8 month(s).    We recommend signing up for the patient portal called "MyChart".  Sign up information is provided on this After Visit Summary.  MyChart is used to connect with patients for Virtual Visits (Telemedicine).  Patients are able to view lab/test results, encounter notes, upcoming appointments, etc.  Non-urgent messages can be sent to your provider as well.   To learn more about what you can do with MyChart, go to [email or web address].   Other Instructions       1st Floor: - Lobby - Registration  - Pharmacy  - Lab - Cafe  2nd Floor: - PV Lab - Diagnostic Testing (echo, CT, nuclear med)  3rd Floor: - Vacant  4th Floor: - TCTS (cardiothoracic surgery) - AFib Clinic - Structural Heart Clinic - Vascular Surgery  - Vascular Ultrasound  5th Floor: - HeartCare Cardiology (general and EP) - Clinical Pharmacy for coumadin, hypertension, lipid, weight-loss medications, and med management appointments    Valet parking services will be available as well.

## 2023-05-28 DIAGNOSIS — H18832 Recurrent erosion of cornea, left eye: Secondary | ICD-10-CM | POA: Diagnosis not present

## 2023-05-28 DIAGNOSIS — S0502XA Injury of conjunctiva and corneal abrasion without foreign body, left eye, initial encounter: Secondary | ICD-10-CM | POA: Diagnosis not present

## 2023-05-28 DIAGNOSIS — S0501XA Injury of conjunctiva and corneal abrasion without foreign body, right eye, initial encounter: Secondary | ICD-10-CM | POA: Diagnosis not present

## 2023-06-26 DIAGNOSIS — E785 Hyperlipidemia, unspecified: Secondary | ICD-10-CM | POA: Diagnosis not present

## 2023-06-26 DIAGNOSIS — J45902 Unspecified asthma with status asthmaticus: Secondary | ICD-10-CM | POA: Diagnosis not present

## 2023-06-26 DIAGNOSIS — I10 Essential (primary) hypertension: Secondary | ICD-10-CM | POA: Diagnosis not present

## 2023-06-28 DIAGNOSIS — Z940 Kidney transplant status: Secondary | ICD-10-CM | POA: Diagnosis not present

## 2023-07-27 DIAGNOSIS — J45902 Unspecified asthma with status asthmaticus: Secondary | ICD-10-CM | POA: Diagnosis not present

## 2023-07-27 DIAGNOSIS — E785 Hyperlipidemia, unspecified: Secondary | ICD-10-CM | POA: Diagnosis not present

## 2023-07-27 DIAGNOSIS — I10 Essential (primary) hypertension: Secondary | ICD-10-CM | POA: Diagnosis not present

## 2023-08-02 DIAGNOSIS — Z940 Kidney transplant status: Secondary | ICD-10-CM | POA: Diagnosis not present

## 2023-08-07 DIAGNOSIS — L97519 Non-pressure chronic ulcer of other part of right foot with unspecified severity: Secondary | ICD-10-CM | POA: Diagnosis not present

## 2023-08-07 DIAGNOSIS — Z79899 Other long term (current) drug therapy: Secondary | ICD-10-CM | POA: Diagnosis not present

## 2023-08-07 DIAGNOSIS — L97521 Non-pressure chronic ulcer of other part of left foot limited to breakdown of skin: Secondary | ICD-10-CM | POA: Diagnosis not present

## 2023-08-07 DIAGNOSIS — Z881 Allergy status to other antibiotic agents status: Secondary | ICD-10-CM | POA: Diagnosis not present

## 2023-08-07 DIAGNOSIS — L97529 Non-pressure chronic ulcer of other part of left foot with unspecified severity: Secondary | ICD-10-CM | POA: Diagnosis not present

## 2023-08-07 DIAGNOSIS — Z7952 Long term (current) use of systemic steroids: Secondary | ICD-10-CM | POA: Diagnosis not present

## 2023-08-07 DIAGNOSIS — Z7902 Long term (current) use of antithrombotics/antiplatelets: Secondary | ICD-10-CM | POA: Diagnosis not present

## 2023-08-07 DIAGNOSIS — E1165 Type 2 diabetes mellitus with hyperglycemia: Secondary | ICD-10-CM | POA: Diagnosis not present

## 2023-08-07 DIAGNOSIS — I10 Essential (primary) hypertension: Secondary | ICD-10-CM | POA: Diagnosis not present

## 2023-08-07 DIAGNOSIS — E11621 Type 2 diabetes mellitus with foot ulcer: Secondary | ICD-10-CM | POA: Diagnosis not present

## 2023-08-07 DIAGNOSIS — Z880 Allergy status to penicillin: Secondary | ICD-10-CM | POA: Diagnosis not present

## 2023-08-07 DIAGNOSIS — Z794 Long term (current) use of insulin: Secondary | ICD-10-CM | POA: Diagnosis not present

## 2023-08-07 DIAGNOSIS — M79672 Pain in left foot: Secondary | ICD-10-CM | POA: Diagnosis not present

## 2023-08-15 ENCOUNTER — Other Ambulatory Visit: Payer: Self-pay | Admitting: Physician Assistant

## 2023-08-15 DIAGNOSIS — Z1231 Encounter for screening mammogram for malignant neoplasm of breast: Secondary | ICD-10-CM

## 2023-08-17 DIAGNOSIS — E1141 Type 2 diabetes mellitus with diabetic mononeuropathy: Secondary | ICD-10-CM | POA: Diagnosis not present

## 2023-08-17 DIAGNOSIS — M2012 Hallux valgus (acquired), left foot: Secondary | ICD-10-CM | POA: Diagnosis not present

## 2023-08-17 DIAGNOSIS — E11628 Type 2 diabetes mellitus with other skin complications: Secondary | ICD-10-CM | POA: Diagnosis not present

## 2023-08-17 DIAGNOSIS — L089 Local infection of the skin and subcutaneous tissue, unspecified: Secondary | ICD-10-CM | POA: Diagnosis not present

## 2023-08-17 DIAGNOSIS — L84 Corns and callosities: Secondary | ICD-10-CM | POA: Diagnosis not present

## 2023-08-17 DIAGNOSIS — M2042 Other hammer toe(s) (acquired), left foot: Secondary | ICD-10-CM | POA: Diagnosis not present

## 2023-08-27 DIAGNOSIS — E785 Hyperlipidemia, unspecified: Secondary | ICD-10-CM | POA: Diagnosis not present

## 2023-08-27 DIAGNOSIS — I10 Essential (primary) hypertension: Secondary | ICD-10-CM | POA: Diagnosis not present

## 2023-08-27 DIAGNOSIS — J45902 Unspecified asthma with status asthmaticus: Secondary | ICD-10-CM | POA: Diagnosis not present

## 2023-08-29 ENCOUNTER — Encounter: Attending: Physician Assistant | Admitting: Physician Assistant

## 2023-08-29 DIAGNOSIS — Z940 Kidney transplant status: Secondary | ICD-10-CM | POA: Diagnosis not present

## 2023-08-29 DIAGNOSIS — E10621 Type 1 diabetes mellitus with foot ulcer: Secondary | ICD-10-CM | POA: Diagnosis not present

## 2023-08-29 DIAGNOSIS — Z7901 Long term (current) use of anticoagulants: Secondary | ICD-10-CM | POA: Diagnosis not present

## 2023-08-29 DIAGNOSIS — L931 Subacute cutaneous lupus erythematosus: Secondary | ICD-10-CM | POA: Insufficient documentation

## 2023-08-29 DIAGNOSIS — S91105D Unspecified open wound of left lesser toe(s) without damage to nail, subsequent encounter: Secondary | ICD-10-CM | POA: Diagnosis not present

## 2023-08-29 DIAGNOSIS — I10 Essential (primary) hypertension: Secondary | ICD-10-CM | POA: Diagnosis not present

## 2023-08-30 DIAGNOSIS — R1013 Epigastric pain: Secondary | ICD-10-CM | POA: Diagnosis not present

## 2023-08-30 DIAGNOSIS — E1122 Type 2 diabetes mellitus with diabetic chronic kidney disease: Secondary | ICD-10-CM | POA: Diagnosis not present

## 2023-08-30 DIAGNOSIS — Z940 Kidney transplant status: Secondary | ICD-10-CM | POA: Diagnosis not present

## 2023-08-30 DIAGNOSIS — Z794 Long term (current) use of insulin: Secondary | ICD-10-CM | POA: Diagnosis not present

## 2023-08-30 DIAGNOSIS — M1990 Unspecified osteoarthritis, unspecified site: Secondary | ICD-10-CM | POA: Diagnosis not present

## 2023-08-30 DIAGNOSIS — Z0001 Encounter for general adult medical examination with abnormal findings: Secondary | ICD-10-CM | POA: Diagnosis not present

## 2023-08-30 DIAGNOSIS — E663 Overweight: Secondary | ICD-10-CM | POA: Diagnosis not present

## 2023-08-30 DIAGNOSIS — E559 Vitamin D deficiency, unspecified: Secondary | ICD-10-CM | POA: Diagnosis not present

## 2023-08-30 DIAGNOSIS — E782 Mixed hyperlipidemia: Secondary | ICD-10-CM | POA: Diagnosis not present

## 2023-08-30 DIAGNOSIS — G629 Polyneuropathy, unspecified: Secondary | ICD-10-CM | POA: Diagnosis not present

## 2023-08-30 DIAGNOSIS — I10 Essential (primary) hypertension: Secondary | ICD-10-CM | POA: Diagnosis not present

## 2023-09-04 DIAGNOSIS — M3214 Glomerular disease in systemic lupus erythematosus: Secondary | ICD-10-CM | POA: Diagnosis not present

## 2023-09-04 DIAGNOSIS — I129 Hypertensive chronic kidney disease with stage 1 through stage 4 chronic kidney disease, or unspecified chronic kidney disease: Secondary | ICD-10-CM | POA: Diagnosis not present

## 2023-09-04 DIAGNOSIS — Z79899 Other long term (current) drug therapy: Secondary | ICD-10-CM | POA: Diagnosis not present

## 2023-09-04 DIAGNOSIS — B259 Cytomegaloviral disease, unspecified: Secondary | ICD-10-CM | POA: Diagnosis not present

## 2023-09-04 DIAGNOSIS — E1122 Type 2 diabetes mellitus with diabetic chronic kidney disease: Secondary | ICD-10-CM | POA: Diagnosis not present

## 2023-09-04 DIAGNOSIS — Z992 Dependence on renal dialysis: Secondary | ICD-10-CM | POA: Diagnosis not present

## 2023-09-04 DIAGNOSIS — Z940 Kidney transplant status: Secondary | ICD-10-CM | POA: Diagnosis not present

## 2023-09-05 ENCOUNTER — Ambulatory Visit
Admission: RE | Admit: 2023-09-05 | Discharge: 2023-09-05 | Disposition: A | Discharge status: Home / Self Care | Source: Ambulatory Visit | Attending: Physician Assistant | Admitting: Physician Assistant

## 2023-09-05 DIAGNOSIS — H04123 Dry eye syndrome of bilateral lacrimal glands: Secondary | ICD-10-CM | POA: Diagnosis not present

## 2023-09-05 DIAGNOSIS — E113393 Type 2 diabetes mellitus with moderate nonproliferative diabetic retinopathy without macular edema, bilateral: Secondary | ICD-10-CM | POA: Diagnosis not present

## 2023-09-05 DIAGNOSIS — Z1231 Encounter for screening mammogram for malignant neoplasm of breast: Secondary | ICD-10-CM | POA: Diagnosis not present

## 2023-09-05 DIAGNOSIS — H18593 Other hereditary corneal dystrophies, bilateral: Secondary | ICD-10-CM | POA: Diagnosis not present

## 2023-09-05 DIAGNOSIS — H35033 Hypertensive retinopathy, bilateral: Secondary | ICD-10-CM | POA: Diagnosis not present

## 2023-09-05 DIAGNOSIS — H25813 Combined forms of age-related cataract, bilateral: Secondary | ICD-10-CM | POA: Diagnosis not present

## 2023-09-05 DIAGNOSIS — M3210 Systemic lupus erythematosus, organ or system involvement unspecified: Secondary | ICD-10-CM | POA: Diagnosis not present

## 2023-09-18 ENCOUNTER — Ambulatory Visit: Admitting: Nurse Practitioner

## 2023-09-21 ENCOUNTER — Ambulatory Visit (HOSPITAL_BASED_OUTPATIENT_CLINIC_OR_DEPARTMENT_OTHER): Admitting: Internal Medicine

## 2023-09-21 DIAGNOSIS — H18593 Other hereditary corneal dystrophies, bilateral: Secondary | ICD-10-CM | POA: Diagnosis not present

## 2023-09-21 DIAGNOSIS — S0502XA Injury of conjunctiva and corneal abrasion without foreign body, left eye, initial encounter: Secondary | ICD-10-CM | POA: Diagnosis not present

## 2023-09-21 DIAGNOSIS — H04123 Dry eye syndrome of bilateral lacrimal glands: Secondary | ICD-10-CM | POA: Diagnosis not present

## 2023-09-21 DIAGNOSIS — H18832 Recurrent erosion of cornea, left eye: Secondary | ICD-10-CM | POA: Diagnosis not present

## 2023-09-21 DIAGNOSIS — Z0100 Encounter for examination of eyes and vision without abnormal findings: Secondary | ICD-10-CM | POA: Diagnosis not present

## 2023-09-21 DIAGNOSIS — H524 Presbyopia: Secondary | ICD-10-CM | POA: Diagnosis not present

## 2023-10-09 DIAGNOSIS — Z940 Kidney transplant status: Secondary | ICD-10-CM | POA: Diagnosis not present

## 2023-10-09 DIAGNOSIS — Z79899 Other long term (current) drug therapy: Secondary | ICD-10-CM | POA: Diagnosis not present

## 2023-10-17 NOTE — Progress Notes (Unsigned)
 Cardiology Office Note    Patient Name: Paula Bass Date of Encounter: 10/17/2023  Primary Care Provider:  Catalina Bare, MD Primary Cardiologist:  Aleene Passe, MD (Inactive) Primary Electrophysiologist: None   Past Medical History    Past Medical History:  Diagnosis Date   Acute diverticulitis 12/01/2017   Anemia    CAD in native artery    a. PCI to pRCA in 2017 in [place]. b.  In 03/2018 she had a repeat cath with 80-90% mid/distal LCx s/p synergy DES, with residual RCA stenosis mg'd medically. c.01/2020 s/p orbital atherectomy/DES to the OM1 and OM2, residual RCA stenosis.   Diabetes mellitus without complication [facility])    Dialysis patient [facility])    Diverticulitis    ESRD (end stage renal disease) (HCC)    GI bleeding    Hypertension    LGI bleed 12/16/2017   Lower GI bleed    Lupus    Renal disorder    dialysis   UTI (urinary tract infection) 12/01/2017    History of Present Illness  Paula Bass is a 53 y.o. female with a PMH of CAD s/p prior PCI in [place]  2017 and repeat LHC 03/2018 with distal left circumflex 85%, OM1 40%, OM2 50%, P RCA stent patent with mid RCA 90% treated medically and PCI/DES to mid/distal LCx, and LHC 02/13/2020 with orbital arthrectomy/DES to OM1 and OM 2, ESRD, DM type II, HTN, HLD, systemic lupus erythematosus, GI bleed, PAF who presents today for 73-month follow-up.  Ms. Gerber was last seen on 05/25/2023 for annual follow-up.  During visit she reported ongoing bouts of chest pain that occur with significant stress.  She also noted episodes of palpitations that occur with stress.  During her visit she denied any current chest pain and nitroglycerin  was ordered as needed.  We discussed the possibility of left heart cath if symptoms persisted.  We also discussed adding Imdur  to current regimen if symptoms did not improve.  She was ordered a 14-day ZIO to evaluate for AF burden however monitor was not returned and results have not  posted.   Patient denies chest pain, palpitations, dyspnea, PND, orthopnea, nausea, vomiting, dizziness, syncope, edema, weight gain, or early satiety.   Discussed the use of AI scribe software for clinical note transcription with the patient, who gave verbal consent to proceed.  History of Present Illness    ***Notes:***Patient will need to establish with new cardiologist***   Review of Systems  Please see the history of present illness.    All other systems reviewed and are otherwise negative except as noted above.  Physical Exam    Wt Readings from Last 3 Encounters:  05/25/23 148 lb 3.2 oz (67.2 kg)  04/05/22 136 lb 3.2 oz (61.8 kg)  10/25/21 137 lb 12.8 oz (62.5 kg)   CD:Uyzmz were no vitals filed for this visit.,There is no height or weight on file to calculate BMI. GEN: Well nourished, well developed in no acute distress Neck: No JVD; No carotid bruits Pulmonary: Clear to auscultation without rales, wheezing or rhonchi  Cardiovascular: Normal rate. Regular rhythm. Normal S1. Normal S2.   Murmurs: There is no murmur.  ABDOMEN: Soft, non-tender, non-distended EXTREMITIES:  No edema; No deformity   EKG/LABS/ Recent Cardiac Studies   ECG personally reviewed by me today - ***  Risk Assessment/Calculations:   {Does this patient have ATRIAL FIBRILLATION?:[phone]}      Lab Results  Component Value Date   WBC 6.7 03/05/2020   HGB  11.3 03/05/2020   HCT 33.2 (L) 03/05/2020   MCV 92 03/05/2020   PLT 175 03/05/2020   Lab Results  Component Value Date   CREATININE 1.22 (H) 03/22/2021   BUN 19 03/22/2021   NA 137 03/22/2021   K 4.1 03/22/2021   CL 102 03/22/2021   CO2 20 03/22/2021   Lab Results  Component Value Date   CHOL 120 04/15/2018   HDL 59 04/15/2018   LDLCALC 43 04/15/2018   TRIG 92 04/15/2018   CHOLHDL 2.0 04/15/2018    Lab Results  Component Value Date   HGBA1C 7.7 (H) 07/08/2019   Assessment & Plan    Assessment and Plan Assessment &  Plan     1.  Chest pain  2.History of CAD: -s/p multiple PCI with last 01/2020 treated with orbital arthrectomy/PCI and DES to OM1 and OM 2  3.History of PAF: -s/p renal transplant and currently reports no known recurrence of tachycardia.  4.  Essential hypertension  5.  Hyperlipidemia  6. History of ESRD s/p transplant -Patient underwent renal transplant in 2022 -Follow-up per nephrology.      Disposition: Follow-up with Aleene Passe, MD (Inactive) or APP in *** months {Are you ordering a CV Procedure (e.g. stress test, cath, DCCV, TEE, etc)?   Press F2        :[id number]}   Signed, Wyn Raddle, Jackee Shove, NP 10/17/2023, 8:52 AM  [facility]

## 2023-10-18 ENCOUNTER — Encounter: Payer: Self-pay | Admitting: Nurse Practitioner

## 2023-10-18 ENCOUNTER — Ambulatory Visit: Attending: Nurse Practitioner | Admitting: Nurse Practitioner

## 2023-10-18 VITALS — BP 106/70 | HR 75 | Ht 62.0 in | Wt 147.0 lb

## 2023-10-18 DIAGNOSIS — I10 Essential (primary) hypertension: Secondary | ICD-10-CM

## 2023-10-18 DIAGNOSIS — I480 Paroxysmal atrial fibrillation: Secondary | ICD-10-CM

## 2023-10-18 DIAGNOSIS — E785 Hyperlipidemia, unspecified: Secondary | ICD-10-CM | POA: Diagnosis not present

## 2023-10-18 DIAGNOSIS — I2510 Atherosclerotic heart disease of native coronary artery without angina pectoris: Secondary | ICD-10-CM | POA: Diagnosis not present

## 2023-10-18 DIAGNOSIS — Z940 Kidney transplant status: Secondary | ICD-10-CM

## 2023-10-18 DIAGNOSIS — R079 Chest pain, unspecified: Secondary | ICD-10-CM | POA: Diagnosis not present

## 2023-10-18 NOTE — Patient Instructions (Signed)
 Medication Instructions:  Your physician recommends that you continue on your current medications as directed. Please refer to the Current Medication list given to you today. *If you need a refill on your cardiac medications before your next appointment, please call your pharmacy*  Lab Work: None ordered If you have labs (blood work) drawn today and your tests are completely normal, you will receive your results only by: MyChart Message (if you have MyChart) OR A paper copy in the mail If you have any lab test that is abnormal or we need to change your treatment, we will call you to review the results.  Testing/Procedures: None ordered  Follow-Up: At [facility], you and your health needs are our priority.  As part of our continuing mission to provide you with exceptional heart care, our providers are all part of one team.  This team includes your primary Cardiologist (physician) and Advanced Practice Providers or APPs (Physician Assistants and Nurse Practitioners) who all work together to provide you with the care you need, when you need it.  Your next appointment:   6 month(s)  Provider:   Georganna Archer, MD    We recommend signing up for the patient portal called MyChart.  Sign up information is provided on this After Visit Summary.  MyChart is used to connect with patients for Virtual Visits (Telemedicine).  Patients are able to view lab/test results, encounter notes, upcoming appointments, etc.  Non-urgent messages can be sent to your provider as well.   To learn more about what you can do with MyChart, go to [email or web address].   Other Instructions:  [facility]  Please try to avoid these triggers: Do not use any products that have nicotine or tobacco in them. These include cigarettes, e-cigarettes, and chewing tobacco. If you need help quitting, ask your doctor. Eat heart-healthy foods. Talk with your doctor about the right eating plan for you. Exercise  regularly as told by your doctor. Stay hydrated Do not drink alcohol, Caffeine or chocolate. Lose weight if you are overweight. Do not use drugs, including cannabis

## 2023-11-02 DIAGNOSIS — Z008 Encounter for other general examination: Secondary | ICD-10-CM | POA: Diagnosis not present

## 2023-11-06 ENCOUNTER — Telehealth: Payer: Self-pay | Admitting: Podiatry

## 2023-11-06 NOTE — Telephone Encounter (Signed)
 Called and left message for patient to contact office to confirm appointment cancellation request

## 2023-11-08 ENCOUNTER — Ambulatory Visit: Admitting: Podiatry

## 2023-11-21 DIAGNOSIS — Z940 Kidney transplant status: Secondary | ICD-10-CM | POA: Diagnosis not present

## 2023-11-29 ENCOUNTER — Ambulatory Visit

## 2023-11-29 ENCOUNTER — Encounter: Payer: Self-pay | Admitting: Podiatry

## 2023-11-29 ENCOUNTER — Ambulatory Visit: Admitting: Podiatry

## 2023-11-29 DIAGNOSIS — M2042 Other hammer toe(s) (acquired), left foot: Secondary | ICD-10-CM

## 2023-11-29 NOTE — Progress Notes (Signed)
 Subjective:   Patient ID: Paula Bass, female   DOB: 53 y.o.   MRN: [id number]   HPI Patient presents stating she is having a lot of pain with the second toe on her left foot.  States that it is becoming increasingly irritated have gotten infected is now better but needs to be fixed.  States she cannot wear shoes comfortably has had a kidney transplant doing much better and her A1c has been around 7.5.  Patient does not smoke tries to be active   Review of Systems  All other systems reviewed and are negative.       Objective:  Physical Exam Vitals and nursing note reviewed.  Constitutional:      Appearance: She is well-developed.  Pulmonary:     Effort: Pulmonary effort is normal.  Musculoskeletal:        General: Normal range of motion.  Skin:    General: Skin is warm.  Neurological:     Mental Status: She is alert.     Neurovascular status was found to be intact with patient found to have good digital flow warm feet good digital perfusion.  Has a significantly elevated rigid contracture digit to left with probability of damage or the flexor plate because this only occurred recently.  Patient was noted to have good range of motion muscle strength     Assessment:  Significant rigid contracture hammertoe deformity second digit left foot with probability for flexor plate damage     Plan:  H&P reviewed condition and explained digital procedure.  She is going to her family doctor tomorrow and she is going to have her A1c repeated and I want to check that before I would consider surgery but I did discuss digital fusion and the possibility of getting this fixed.  She would like to pursue this but is going to hold off until she sees her family physician and discuss this with her.  I do think overall she is a good risk as she does have good circulatory flow she does have some calcification of the dorsalis pedis artery but I think that was more due to 7 years of being on  dialysis  X-rays indicate significant digital contracture digit 2 left at the MPJ with worry that this would eventually ulcerate if not corrected

## 2023-11-30 DIAGNOSIS — R1013 Epigastric pain: Secondary | ICD-10-CM | POA: Diagnosis not present

## 2023-11-30 DIAGNOSIS — E782 Mixed hyperlipidemia: Secondary | ICD-10-CM | POA: Diagnosis not present

## 2023-11-30 DIAGNOSIS — E663 Overweight: Secondary | ICD-10-CM | POA: Diagnosis not present

## 2023-11-30 DIAGNOSIS — E559 Vitamin D deficiency, unspecified: Secondary | ICD-10-CM | POA: Diagnosis not present

## 2023-11-30 DIAGNOSIS — Z940 Kidney transplant status: Secondary | ICD-10-CM | POA: Diagnosis not present

## 2023-11-30 DIAGNOSIS — G629 Polyneuropathy, unspecified: Secondary | ICD-10-CM | POA: Diagnosis not present

## 2023-11-30 DIAGNOSIS — I10 Essential (primary) hypertension: Secondary | ICD-10-CM | POA: Diagnosis not present

## 2023-11-30 DIAGNOSIS — Z794 Long term (current) use of insulin: Secondary | ICD-10-CM | POA: Diagnosis not present

## 2023-11-30 DIAGNOSIS — M1990 Unspecified osteoarthritis, unspecified site: Secondary | ICD-10-CM | POA: Diagnosis not present

## 2023-11-30 DIAGNOSIS — E1122 Type 2 diabetes mellitus with diabetic chronic kidney disease: Secondary | ICD-10-CM | POA: Diagnosis not present

## 2023-12-06 DIAGNOSIS — Z940 Kidney transplant status: Secondary | ICD-10-CM | POA: Diagnosis not present

## 2023-12-11 DIAGNOSIS — E1122 Type 2 diabetes mellitus with diabetic chronic kidney disease: Secondary | ICD-10-CM | POA: Diagnosis not present

## 2023-12-11 DIAGNOSIS — Z79899 Other long term (current) drug therapy: Secondary | ICD-10-CM | POA: Diagnosis not present

## 2023-12-11 DIAGNOSIS — I129 Hypertensive chronic kidney disease with stage 1 through stage 4 chronic kidney disease, or unspecified chronic kidney disease: Secondary | ICD-10-CM | POA: Diagnosis not present

## 2023-12-11 DIAGNOSIS — B259 Cytomegaloviral disease, unspecified: Secondary | ICD-10-CM | POA: Diagnosis not present

## 2023-12-11 DIAGNOSIS — Z992 Dependence on renal dialysis: Secondary | ICD-10-CM | POA: Diagnosis not present

## 2023-12-11 DIAGNOSIS — Z940 Kidney transplant status: Secondary | ICD-10-CM | POA: Diagnosis not present

## 2023-12-11 DIAGNOSIS — Z23 Encounter for immunization: Secondary | ICD-10-CM | POA: Diagnosis not present

## 2023-12-13 ENCOUNTER — Ambulatory Visit: Admitting: Podiatry

## 2023-12-13 ENCOUNTER — Encounter: Payer: Self-pay | Admitting: Podiatry

## 2023-12-13 VITALS — Ht 62.0 in | Wt 147.0 lb

## 2023-12-13 DIAGNOSIS — M2042 Other hammer toe(s) (acquired), left foot: Secondary | ICD-10-CM

## 2023-12-14 NOTE — Progress Notes (Signed)
 Subjective:   Patient ID: Paula Bass, female   DOB: 53 y.o.   MRN: [id number]   HPI Patient states she has had her blood work done and forgot to bring it but she wants to get this second toe fixed.  States she just had this done in the last few days   ROS      Objective:  Physical Exam  Neurovascular status is stable good digital perfusion is noted with a significant rigid elevation second toe left foot that is very painful dorsally and could be at 1 point subject to ulceration if not treated     Assessment:  Severe hammertoe deformity second toe left with elevated joint     Plan:  H&P reviewed I do think this toe needs to be straightened her doctor is Dr. Rosalea and she is going to get us  the results of blood work and if the A1c is reasonable this is something we will consider correcting.  I did allow her to read consent form today as she is here and she read understanding complications alternative treatments that with her long history of diabetes and being on dialysis then amputation is also a long-term possibility.  She wants surgery understanding risk and signed consent form and we will get the results of testing and hopefully be able to schedule this for her at the surgical center with all questions answered

## 2023-12-27 ENCOUNTER — Telehealth: Payer: Self-pay | Admitting: Podiatry

## 2023-12-27 NOTE — Telephone Encounter (Signed)
 Called patient to see if she had had the blood work done that provider was wanting to review before scheduling surgery. She has but has not had a chance to gp to PCP office to get them released. Patient was advised that once she has dont his to contact office so that Dr. Magdalen could review and we would proceed from there with how to schedule.

## 2024-01-23 DIAGNOSIS — Z940 Kidney transplant status: Secondary | ICD-10-CM | POA: Diagnosis not present

## 2024-01-23 DIAGNOSIS — Z992 Dependence on renal dialysis: Secondary | ICD-10-CM | POA: Diagnosis not present

## 2024-01-23 DIAGNOSIS — Z79899 Other long term (current) drug therapy: Secondary | ICD-10-CM | POA: Diagnosis not present

## 2024-03-04 ENCOUNTER — Telehealth: Payer: Self-pay | Admitting: Podiatry

## 2024-03-04 NOTE — Telephone Encounter (Signed)
 States A1C was faxed into [phone] can remember date and has not heard anything about scheduling surgery.  She will refax tomorrow.

## 2024-03-05 ENCOUNTER — Encounter (HOSPITAL_COMMUNITY): Payer: Self-pay

## 2024-03-05 ENCOUNTER — Emergency Department (HOSPITAL_COMMUNITY)
Admission: EM | Admit: 2024-03-05 | Discharge: 2024-03-05 | Discharge status: Against Medical Advice | Attending: Emergency Medicine | Admitting: Emergency Medicine

## 2024-03-05 ENCOUNTER — Emergency Department (HOSPITAL_COMMUNITY)

## 2024-03-05 ENCOUNTER — Telehealth: Payer: Self-pay | Admitting: Student in an Organized Health Care Education/Training Program

## 2024-03-05 ENCOUNTER — Other Ambulatory Visit: Payer: Self-pay

## 2024-03-05 DIAGNOSIS — Z5321 Procedure and treatment not carried out due to patient leaving prior to being seen by health care provider: Secondary | ICD-10-CM | POA: Insufficient documentation

## 2024-03-05 DIAGNOSIS — R0602 Shortness of breath: Secondary | ICD-10-CM | POA: Diagnosis present

## 2024-03-05 DIAGNOSIS — R079 Chest pain, unspecified: Secondary | ICD-10-CM | POA: Insufficient documentation

## 2024-03-05 LAB — BASIC METABOLIC PANEL WITH GFR
Anion gap: 11 (ref 5–15)
BUN: 15 mg/dL (ref 6–20)
CO2: 22 mmol/L (ref 22–32)
Calcium: 10 mg/dL (ref 8.9–10.3)
Chloride: 105 mmol/L (ref 98–111)
Creatinine, Ser: 0.85 mg/dL (ref 0.44–1.00)
GFR, Estimated: >60 mL/min
Glucose, Bld: 155 mg/dL — ABNORMAL HIGH (ref 70–99)
Potassium: 4.4 mmol/L (ref 3.5–5.1)
Sodium: 138 mmol/L (ref 135–145)

## 2024-03-05 LAB — CBC
HCT: 44.8 % (ref 36.0–46.0)
Hemoglobin: 14.5 g/dL (ref 12.0–15.0)
MCH: 28.3 pg (ref 26.0–34.0)
MCHC: 32.4 g/dL (ref 30.0–36.0)
MCV: 87.5 fL (ref 80.0–100.0)
Platelets: 236 K/uL (ref 150–400)
RBC: 5.12 MIL/uL — ABNORMAL HIGH (ref 3.87–5.11)
RDW: 15 % (ref 11.5–15.5)
WBC: 11.4 K/uL — ABNORMAL HIGH (ref 4.0–10.5)
nRBC: 0 % (ref 0.0–0.2)

## 2024-03-05 LAB — TROPONIN T, HIGH SENSITIVITY: Troponin T High Sensitivity: <15 ng/L (ref 0–19)

## 2024-03-05 NOTE — ED Triage Notes (Signed)
 Pt. Stated, Ive had chest pain and SOB for a week.

## 2024-03-05 NOTE — Telephone Encounter (Signed)
 Spoke with pt reports intermittent CP with activity.    Pt has not used NTG reports forgot about medication and is at work until 5 pm.  Paula Bass is planning to go to ED after work. Pt rates CP 5:10 feels as if can't take a good deep breath.  Reports left arm feels numb denies nausea, jaw and shoulder pain.   Pt has a hx of PCI and reports pain feels very similar.  Advised pt to immediately go to the ED for evaluation.  Pt expresses understanding.

## 2024-03-05 NOTE — Telephone Encounter (Signed)
 Pt c/o of Chest Pain: STAT if active (IN THIS MOMENT) CP, including tightness, pressure, jaw pain, shoulder/upper arm/back pain, SOB, nausea, and vomiting.  1. Are you having CP right now (tightness, pressure, or discomfort)?  Yes   2. Are you experiencing any other symptoms (ex. SOB, nausea, vomiting, sweating)?  SOB for a week or so (not currently)   3. How long have you been experiencing CP?  About 2 weeks  4. Is your CP continuous or coming and going?  Coming and going   5. Have you taken Nitroglycerin ?  No, she says she forgot about it.

## 2024-03-05 NOTE — ED Notes (Signed)
Pt left AMA due to wait time  

## 2024-03-05 NOTE — ED Provider Triage Note (Signed)
 Emergency Medicine Provider Triage Evaluation Note  Paula Bass , a 54 y.o. female  was evaluated in triage.  Pt complains of chest pain and shortness of breath worse with exertion x 1 week, cardiac history.  Review of Systems  Positive: Chest pain, shortness of breath Negative: Cough, congestion, fever, chills, nausea, vomiting  Physical Exam  BP 125/83   Pulse 81   LMP 02/16/2018  Gen:   Awake, no distress   Resp:  Normal effort  MSK:   Moves extremities without difficulty  Other:    Medical Decision Making  Medically screening exam initiated at 1:43 PM.  Appropriate orders placed.  Vernell Felty Forcier was informed that the remainder of the evaluation will be completed by another provider, this initial triage assessment does not replace that evaluation, and the importance of remaining in the ED until their evaluation is complete.  Labs and imaging ordered   Francis Ileana LOISE DEVONNA 03/05/24 1346

## 2024-03-05 NOTE — ED Triage Notes (Signed)
 Pt came in via POV d/t CP/SOB with pain radiation to the Lt arm for the past 9-10 days. Hx of stents placed in 2020 & 2022. Does have nitroglycerin  at home but was at work & could not take it. Pain worse with exertion, A/Ox4, rates her pain 6/10 during triage.

## 2024-03-07 ENCOUNTER — Other Ambulatory Visit: Payer: Self-pay | Admitting: Podiatry

## 2024-03-11 NOTE — H&P (View-Only) (Signed)
 " Cardiology Office Note:  .   Date:  03/11/2024  ID:  Paula Bass, DOB Jan 19, 1971, MRN [id number] PCP: Catalina Bare, MD  [facility] Providers Cardiologist:  Aleene Passe, MD (Inactive) { Chief Complaint: No chief complaint on file.   History of Present Illness: .    Paula Bass is a 54 y.o. female with a PMH of CAD s/p multiple PCIs (2017, 2020, 2021), ESRD s/p KT (2022), HTN, HLD, DM2, PAF (not on OAC), and SLE who presents for follow-up.      {There is no content from the last Narrative History section.}     Pre-Chart Notes: - Last seen in clinic on 10/18/2023 with [name] for follow-up.  Discussed the use of AI scribe software for clinical note transcription with the patient, who gave verbal consent to proceed.  History of Present Illness       Studies Reviewed: SABRA    EKG: ***       Cardiac Studies & Procedures   ______________________________________________________________________________________________ CARDIAC CATHETERIZATION  CARDIAC CATHETERIZATION 02/13/2020  Conclusion Conclusions: 1. Severe two-vessel coronary artery disease including multifocal disease involving the LCx/OM's (70% proximal OM1, 99% proximal OM2, and 60% mid/distal LCx at the takeoff of OM2) as well as serial 99% and 70% mid RCA lesions. 2. Moderate, nonobstructive proximal/mid LAD disease that is not hemodynamically significant (FFR 0.91). 3. Patent proximal RCA and distal LCx stents. 4. Successful orbital atherectomy/PCI to OM1 using Resolute Onyx 3.0 x 26 mm drug-eluting stent with 0% residual stenosis and TIMI-3 flow. 5. Successful orbital atherectomy/PCI to OM 2 using Resolute Onyx 2.25 x 8 mm drug-eluting stent with 0% residual stenosis and TIMI-3 flow.  Recommendations: 1. Overnight extended recovery.  Nephrology notified to facilitate dialysis tomorrow prior to discharge or delay outpatient dialysis till later tomorrow. 2. Continue  indefinite dual antiplatelet therapy with aspirin  and clopidogrel . 3. Aggressive secondary prevention. 4. Favor medical management of RCA.  If she continues to have some angina, orbital atherectomy/PCI could be considered in a staged fashion.  [name], MD [facility]  Findings Coronary Findings Diagnostic  Dominance: Right  Left Main Vessel is large. Vessel is angiographically normal.  Left Anterior Descending Prox LAD to Mid LAD lesion is 50% stenosed. The lesion is severely calcified. Pressure wire/FFR was performed on the lesion. FFR: 0.91.  First Diagonal Branch Vessel is small in size.  Second Diagonal Branch Vessel is moderate in size.  Third Diagonal Branch Vessel is small in size.  Left Circumflex Vessel is large. Dist Cx-1 lesion is 50% stenosed. Previously placed Dist Cx-2 stent (unknown type) is widely patent.  First Obtuse Marginal Branch Vessel is large in size. Ost 1st Mrg to 1st Mrg lesion is 70% stenosed. The lesion is severely calcified.  Second Obtuse Marginal Branch Vessel is moderate in size. Ost 2nd Mrg to 2nd Mrg lesion is 99% stenosed. The lesion is focal. The lesion is severely calcified.  Third Obtuse Marginal Branch Vessel is small in size.  Fourth Obtuse Marginal Branch Vessel is small in size.  Right Coronary Artery Vessel is moderate in size. Non-stenotic Prox RCA lesion was previously treated. Mid RCA-1 lesion is 99% stenosed. The lesion is severely calcified. Mid RCA-2 lesion is 70% stenosed. The lesion is severely calcified.  Right Posterior Atrioventricular Artery Vessel is small in size.  Intervention  Ost 1st Mrg to 1st Mrg lesion Atherectomy Orbital atherectomy was performed using a CROWN DIAMONDBACK CLASSIC 1.25. Stent A drug-eluting stent was successfully placed using  a STENT RESOLUTE ONYX 3.0X26. Maximum pressure: 16 atm. Post-stent angioplasty was performed using a BALLOON SAPPHIRE [place] 3.25X18. Maximum  pressure:  18 atm. Post-Intervention Lesion Assessment The intervention was successful. Pre-interventional TIMI flow is 3. Post-intervention TIMI flow is 3. No complications occurred at this lesion. There is a 0% residual stenosis post intervention.  Ost 2nd Mrg to 2nd Mrg lesion Atherectomy Orbital atherectomy was performed using a CROWN DIAMONDBACK CLASSIC 1.25. Stent A drug-eluting stent was successfully placed using a STENT RESOLUTE ONYX 2.25X8. Maximum pressure: 16 atm. Post-stent angioplasty was performed using a BALLOON [place] EMERGE MR 2.5X6. Maximum pressure:  18 atm. Post-Intervention Lesion Assessment The intervention was successful. Pre-interventional TIMI flow is 2. Post-intervention TIMI flow is 3. No complications occurred at this lesion. There is a 0% residual stenosis post intervention.   CARDIAC CATHETERIZATION  CARDIAC CATHETERIZATION 04/19/2018  Conclusion Conclusions: 1. Significant two-vessel CAD, with 80-90% mid/distal LCx and 90% mid RCA stenoses. 2. Moderate, non-obstructive LAD and proximal LCx/OM disease. 3. Patent stent in proximal RCA. 4. Normal left ventricular systolic function and filling pressure. 5. Successful PCI to mid/distal LCx using Synergy 2.25 x 20 mm DES with 0% residual stenosis and TIMI-3 flow.  Recommendations: 1. Overnight extended recovery; anticipate d/c home tomorrow AM with HD at outpatient center tomorrow PM. 2. Continue dual antiplatelet therapy with aspirin  and clopidogrel  for at least 12 months, ideally longer. 3. Medical therapy of LAD and RCA disease.  If she has recurrent CP, PCI to mid RCA with orbital atherectomy will need to be considered.  [name], MD [facility] Pager: [phone]  Findings Coronary Findings Diagnostic  Dominance: Right  Left Main Vessel is large. Vessel is angiographically normal.  Left Circumflex Vessel is large. Dist Cx lesion is 85% stenosed. The lesion is irregular.  First  Obtuse Marginal Branch Vessel is large in size. Ost 1st Mrg to 1st Mrg lesion is 40% stenosed. The lesion is severely calcified.  Second Obtuse Marginal Branch Vessel is moderate in size. Ost 2nd Mrg to 2nd Mrg lesion is 50% stenosed. The lesion is focal. The lesion is calcified.  Third Obtuse Marginal Branch Vessel is small in size.  Fourth Obtuse Marginal Branch Vessel is small in size.  Right Coronary Artery Vessel is moderate in size. Previously placed Prox RCA stent (unknown type) is widely patent. Mid RCA lesion is 90% stenosed. The lesion is moderately calcified.  Intervention  Dist Cx lesion Stent Lesion length:  15 mm. CATHETER LAUNCHER 6FREBU 3.75 guide catheter was inserted. Lesion crossed with guidewire using a WIRE [place] 190. Pre-stent angioplasty was performed using a BALLOON SAPPHIRE 2.0X12. A drug-eluting stent was successfully placed using a STENT SYNERGY DES 2.25X20. Maximum pressure: 14 atm. Stent strut is well apposed. Post-stent angioplasty was performed using a BALLOON SAPPHIRE [place] 2.25X15. Maximum pressure:  16 atm. Post-Intervention Lesion Assessment The intervention was successful. Pre-interventional TIMI flow is 3. Post-intervention TIMI flow is 3. No complications occurred at this lesion. There is a 0% residual stenosis post intervention.   STRESS TESTS  MYOCARDIAL PERFUSION IMAGING 02/19/2019  Interpretation Summary  Nuclear stress EF: 66%. No wall motion abnormality  There was no ST segment deviation noted during stress.  This is a low risk study. No perfusion defects.  The study is normal.   ECHOCARDIOGRAM  ECHOCARDIOGRAM COMPLETE 09/25/2022  Narrative ECHOCARDIOGRAM REPORT    Patient Name:   MARCHE HOTTENSTEIN [facility] Date of Exam: 09/25/2022 Medical Rec #:  [id number]  Height:       61.0 in Accession #:    [id number]            Weight:       136.2 lb Date of Birth:  1970/07/06            BSA:          1.604 m Patient Age:     51 years              BP:           134/52 mmHg Patient Gender: F                     HR:           74 bpm. Exam Location:  [address]  Procedure: 2D Echo, 3D Echo, Cardiac Doppler and Color Doppler  Indications:    &85.1 Palpitations  History:        Patient has prior history of Echocardiogram examinations, most recent 05/12/2020. CAD, Renal disease; Risk Factors:Hypertension and HLD.  Sonographer:    Waldo Guadalajara RCS Referring Phys: [phone] PHILIP J NAHSER  IMPRESSIONS   1. Left ventricular ejection fraction, by estimation, is 65 to 70%. Left ventricular ejection fraction by 3D volume is 69 %. The left ventricle has normal function. The left ventricle has no regional wall motion abnormalities. There is mild left ventricular hypertrophy. Left ventricular diastolic parameters were normal. 2. Right ventricular systolic function is normal. The right ventricular size is normal. There is normal pulmonary artery systolic pressure. The estimated right ventricular systolic pressure is 15.1 mmHg. 3. The mitral valve is normal in structure. Trivial mitral valve regurgitation. 4. The aortic valve is tricuspid. Aortic valve regurgitation is not visualized. No aortic stenosis is present. 5. The inferior vena cava is normal in size with greater than 50% respiratory variability, suggesting right atrial pressure of 3 mmHg.  Comparison(s): No significant change from prior study.  FINDINGS Left Ventricle: Left ventricular ejection fraction, by estimation, is 65 to 70%. Left ventricular ejection fraction by 3D volume is 69 %. The left ventricle has normal function. The left ventricle has no regional wall motion abnormalities. The left ventricular internal cavity size was normal in size. There is mild left ventricular hypertrophy. Left ventricular diastolic parameters were normal.  Right Ventricle: The right ventricular size is normal. No increase in right ventricular wall thickness. Right ventricular  systolic function is normal. There is normal pulmonary artery systolic pressure. The tricuspid regurgitant velocity is 1.74 m/s, and with an assumed right atrial pressure of 3 mmHg, the estimated right ventricular systolic pressure is 15.1 mmHg.  Left Atrium: Left atrial size was normal in size.  Right Atrium: Right atrial size was normal in size.  Pericardium: There is no evidence of pericardial effusion.  Mitral Valve: The mitral valve is normal in structure. Trivial mitral valve regurgitation.  Tricuspid Valve: The tricuspid valve is normal in structure. Tricuspid valve regurgitation is trivial.  Aortic Valve: The aortic valve is tricuspid. Aortic valve regurgitation is not visualized. No aortic stenosis is present.  Pulmonic Valve: The pulmonic valve was normal in structure. Pulmonic valve regurgitation is trivial.  Aorta: The aortic root is normal in size and structure.  Venous: The inferior vena cava is normal in size with greater than 50% respiratory variability, suggesting right atrial pressure of 3 mmHg.  IAS/Shunts: The atrial septum is grossly normal.   LEFT VENTRICLE PLAX 2D LVIDd:  3.60 cm         Diastology LVIDs:         2.50 cm         LV e' medial:    6.09 cm/s LV PW:         1.10 cm         LV E/e' medial:  12.1 LV IVS:        1.20 cm         LV e' lateral:   9.90 cm/s LVOT diam:     2.10 cm         LV E/e' lateral: 7.5 LV SV:         69 LV SV Index:   43 LVOT Area:     3.46 cm        3D Volume EF LV 3D EF:    Left ventricul ar ejection fraction by 3D volume is 69 %.  3D Volume EF: 3D EF:        69 % LV EDV:       69 ml LV ESV:       21 ml LV SV:        48 ml  RIGHT VENTRICLE RV Basal diam:  2.60 cm RV S prime:     11.00 cm/s TAPSE (M-mode): 1.8 cm RVSP:           15.1 mmHg  LEFT ATRIUM             Index        RIGHT ATRIUM           Index LA diam:        2.80 cm 1.75 cm/m   RA Pressure: 3.00 mmHg LA Vol (A2C):   39.3 ml 24.50 ml/m   RA Area:     10.20 cm LA Vol (A4C):   20.1 ml 12.53 ml/m  RA Volume:   19.90 ml  12.40 ml/m LA Biplane Vol: 28.2 ml 17.58 ml/m AORTIC VALVE LVOT Vmax:   83.80 cm/s LVOT Vmean:  57.600 cm/s LVOT VTI:    0.199 m  AORTA Ao Root diam: 2.80 cm Ao Asc diam:  2.90 cm  MITRAL VALVE               TRICUSPID VALVE MV Area (PHT):             TR Peak grad:   12.1 mmHg MV Decel Time:             TR Vmax:        174.00 cm/s MV E velocity: 73.90 cm/s  Estimated RAP:  3.00 mmHg MV A velocity: 72.30 cm/s  RVSP:           15.1 mmHg MV E/A ratio:  1.02 SHUNTS Systemic VTI:  0.20 m Systemic Diam: 2.10 cm  Powell Sorrow MD Electronically signed by Powell Sorrow MD Signature Date/Time: 09/25/2022/2:54:07 PM    Final    MONITORS  LONG TERM MONITOR-LIVE TELEMETRY (3-14 DAYS) 04/13/2021  Narrative  Sinus rhythm  Frequent PACs , rare PVCS  No serious arrhythmias   Patch Wear Time:  13 days and 20 hours (2023-01-26T21:37:45-0500 to 2023-02-09T17:51:12-498)  Patient had a min HR of 59 bpm, max HR of 134 bpm, and avg HR of 84 bpm. Predominant underlying rhythm was Sinus Rhythm. Isolated SVEs were frequent (24.1%, [id number]), SVE Couplets were rare (<1.0%, 399), and SVE Triplets were rare (<1.0%, 54). Isolated VEs were rare (<1.0%), VE Couplets were rare (<  1.0%), and no VE Triplets were present.       ______________________________________________________________________________________________      Results   Risk Assessment/Calculations:     CHA2DS2-VASc Score = 3  {Click here to calculate score.  REFRESH note before signing. :1} This indicates a 3.2% annual risk of stroke. The patient's score is based upon: CHF History: 0 HTN History: 1 Diabetes History: 1 Stroke History: 0 Vascular Disease History: 0 Age Score: 0 Gender Score: 1     No BP recorded.  {Refresh Note OR Click here to enter BP  :1}***        Physical Exam:    VS:  LMP 02/16/2018  ***    Wt  Readings from Last 3 Encounters:  03/05/24 150 lb (68 kg)  12/13/23 147 lb (66.7 kg)  10/18/23 147 lb (66.7 kg)     GEN: Well nourished, well developed, in no acute distress NECK: No JVD; No carotid bruits CARDIAC: ***RRR, no murmurs, rubs, gallops RESPIRATORY:  Clear to auscultation without rales, wheezing or rhonchi  ABDOMEN: Soft, non-tender, non-distended, normal bowel sounds EXTREMITIES:  Warm and well perfused, no edema; No deformity, 2+ radial pulses PSYCH: Normal mood and affect   ASSESSMENT AND PLAN: .    Assessment and Plan   #CAD s/p Multiple PCIs On indefinite DAPT  #PAF (not on OAC) - Isolated episode of PAF in the setting of her postsurgical course after renal transplant in 2022. Eliquis***  #HTN  #HLD - LDL on 02/29/2024 was 68 with triglycerides 75 Increase atorvastatin  to 80 mg daily*** Assessment & Plan        {Are you ordering a CV Procedure (e.g. stress test, cath, DCCV, TEE, etc)?   Press F2        :[id number]}    This note was written with the assistance of a dictation microphone or AI dictation software. Please excuse any typos or grammatical errors.   Signed, Georganna Archer, MD  03/11/2024 1:46 PM    [facility] "

## 2024-03-11 NOTE — Progress Notes (Signed)
 " Cardiology Office Note:  .   Date:  03/11/2024  ID:  Paula Bass, DOB Jan 19, 1971, MRN [id number] PCP: Catalina Bare, MD  [facility] Providers Cardiologist:  Aleene Passe, MD (Inactive) { Chief Complaint: No chief complaint on file.   History of Present Illness: .    Paula Bass is a 54 y.o. female with a PMH of CAD s/p multiple PCIs (2017, 2020, 2021), ESRD s/p KT (2022), HTN, HLD, DM2, PAF (not on OAC), and SLE who presents for follow-up.      {There is no content from the last Narrative History section.}     Pre-Chart Notes: - Last seen in clinic on 10/18/2023 with [name] for follow-up.  Discussed the use of AI scribe software for clinical note transcription with the patient, who gave verbal consent to proceed.  History of Present Illness       Studies Reviewed: SABRA    EKG: ***       Cardiac Studies & Procedures   ______________________________________________________________________________________________ CARDIAC CATHETERIZATION  CARDIAC CATHETERIZATION 02/13/2020  Conclusion Conclusions: 1. Severe two-vessel coronary artery disease including multifocal disease involving the LCx/OM's (70% proximal OM1, 99% proximal OM2, and 60% mid/distal LCx at the takeoff of OM2) as well as serial 99% and 70% mid RCA lesions. 2. Moderate, nonobstructive proximal/mid LAD disease that is not hemodynamically significant (FFR 0.91). 3. Patent proximal RCA and distal LCx stents. 4. Successful orbital atherectomy/PCI to OM1 using Resolute Onyx 3.0 x 26 mm drug-eluting stent with 0% residual stenosis and TIMI-3 flow. 5. Successful orbital atherectomy/PCI to OM 2 using Resolute Onyx 2.25 x 8 mm drug-eluting stent with 0% residual stenosis and TIMI-3 flow.  Recommendations: 1. Overnight extended recovery.  Nephrology notified to facilitate dialysis tomorrow prior to discharge or delay outpatient dialysis till later tomorrow. 2. Continue  indefinite dual antiplatelet therapy with aspirin  and clopidogrel . 3. Aggressive secondary prevention. 4. Favor medical management of RCA.  If she continues to have some angina, orbital atherectomy/PCI could be considered in a staged fashion.  [name], MD [facility]  Findings Coronary Findings Diagnostic  Dominance: Right  Left Main Vessel is large. Vessel is angiographically normal.  Left Anterior Descending Prox LAD to Mid LAD lesion is 50% stenosed. The lesion is severely calcified. Pressure wire/FFR was performed on the lesion. FFR: 0.91.  First Diagonal Branch Vessel is small in size.  Second Diagonal Branch Vessel is moderate in size.  Third Diagonal Branch Vessel is small in size.  Left Circumflex Vessel is large. Dist Cx-1 lesion is 50% stenosed. Previously placed Dist Cx-2 stent (unknown type) is widely patent.  First Obtuse Marginal Branch Vessel is large in size. Ost 1st Mrg to 1st Mrg lesion is 70% stenosed. The lesion is severely calcified.  Second Obtuse Marginal Branch Vessel is moderate in size. Ost 2nd Mrg to 2nd Mrg lesion is 99% stenosed. The lesion is focal. The lesion is severely calcified.  Third Obtuse Marginal Branch Vessel is small in size.  Fourth Obtuse Marginal Branch Vessel is small in size.  Right Coronary Artery Vessel is moderate in size. Non-stenotic Prox RCA lesion was previously treated. Mid RCA-1 lesion is 99% stenosed. The lesion is severely calcified. Mid RCA-2 lesion is 70% stenosed. The lesion is severely calcified.  Right Posterior Atrioventricular Artery Vessel is small in size.  Intervention  Ost 1st Mrg to 1st Mrg lesion Atherectomy Orbital atherectomy was performed using a CROWN DIAMONDBACK CLASSIC 1.25. Stent A drug-eluting stent was successfully placed using  a STENT RESOLUTE ONYX 3.0X26. Maximum pressure: 16 atm. Post-stent angioplasty was performed using a BALLOON SAPPHIRE [place] 3.25X18. Maximum  pressure:  18 atm. Post-Intervention Lesion Assessment The intervention was successful. Pre-interventional TIMI flow is 3. Post-intervention TIMI flow is 3. No complications occurred at this lesion. There is a 0% residual stenosis post intervention.  Ost 2nd Mrg to 2nd Mrg lesion Atherectomy Orbital atherectomy was performed using a CROWN DIAMONDBACK CLASSIC 1.25. Stent A drug-eluting stent was successfully placed using a STENT RESOLUTE ONYX 2.25X8. Maximum pressure: 16 atm. Post-stent angioplasty was performed using a BALLOON [place] EMERGE MR 2.5X6. Maximum pressure:  18 atm. Post-Intervention Lesion Assessment The intervention was successful. Pre-interventional TIMI flow is 2. Post-intervention TIMI flow is 3. No complications occurred at this lesion. There is a 0% residual stenosis post intervention.   CARDIAC CATHETERIZATION  CARDIAC CATHETERIZATION 04/19/2018  Conclusion Conclusions: 1. Significant two-vessel CAD, with 80-90% mid/distal LCx and 90% mid RCA stenoses. 2. Moderate, non-obstructive LAD and proximal LCx/OM disease. 3. Patent stent in proximal RCA. 4. Normal left ventricular systolic function and filling pressure. 5. Successful PCI to mid/distal LCx using Synergy 2.25 x 20 mm DES with 0% residual stenosis and TIMI-3 flow.  Recommendations: 1. Overnight extended recovery; anticipate d/c home tomorrow AM with HD at outpatient center tomorrow PM. 2. Continue dual antiplatelet therapy with aspirin  and clopidogrel  for at least 12 months, ideally longer. 3. Medical therapy of LAD and RCA disease.  If she has recurrent CP, PCI to mid RCA with orbital atherectomy will need to be considered.  [name], MD [facility] Pager: [phone]  Findings Coronary Findings Diagnostic  Dominance: Right  Left Main Vessel is large. Vessel is angiographically normal.  Left Circumflex Vessel is large. Dist Cx lesion is 85% stenosed. The lesion is irregular.  First  Obtuse Marginal Branch Vessel is large in size. Ost 1st Mrg to 1st Mrg lesion is 40% stenosed. The lesion is severely calcified.  Second Obtuse Marginal Branch Vessel is moderate in size. Ost 2nd Mrg to 2nd Mrg lesion is 50% stenosed. The lesion is focal. The lesion is calcified.  Third Obtuse Marginal Branch Vessel is small in size.  Fourth Obtuse Marginal Branch Vessel is small in size.  Right Coronary Artery Vessel is moderate in size. Previously placed Prox RCA stent (unknown type) is widely patent. Mid RCA lesion is 90% stenosed. The lesion is moderately calcified.  Intervention  Dist Cx lesion Stent Lesion length:  15 mm. CATHETER LAUNCHER 6FREBU 3.75 guide catheter was inserted. Lesion crossed with guidewire using a WIRE [place] 190. Pre-stent angioplasty was performed using a BALLOON SAPPHIRE 2.0X12. A drug-eluting stent was successfully placed using a STENT SYNERGY DES 2.25X20. Maximum pressure: 14 atm. Stent strut is well apposed. Post-stent angioplasty was performed using a BALLOON SAPPHIRE [place] 2.25X15. Maximum pressure:  16 atm. Post-Intervention Lesion Assessment The intervention was successful. Pre-interventional TIMI flow is 3. Post-intervention TIMI flow is 3. No complications occurred at this lesion. There is a 0% residual stenosis post intervention.   STRESS TESTS  MYOCARDIAL PERFUSION IMAGING 02/19/2019  Interpretation Summary  Nuclear stress EF: 66%. No wall motion abnormality  There was no ST segment deviation noted during stress.  This is a low risk study. No perfusion defects.  The study is normal.   ECHOCARDIOGRAM  ECHOCARDIOGRAM COMPLETE 09/25/2022  Narrative ECHOCARDIOGRAM REPORT    Patient Name:   Paula Bass [facility] Date of Exam: 09/25/2022 Medical Rec #:  [id number]  Height:       61.0 in Accession #:    [id number]            Weight:       136.2 lb Date of Birth:  1970/07/06            BSA:          1.604 m Patient Age:     51 years              BP:           134/52 mmHg Patient Gender: F                     HR:           74 bpm. Exam Location:  [address]  Procedure: 2D Echo, 3D Echo, Cardiac Doppler and Color Doppler  Indications:    &85.1 Palpitations  History:        Patient has prior history of Echocardiogram examinations, most recent 05/12/2020. CAD, Renal disease; Risk Factors:Hypertension and HLD.  Sonographer:    Waldo Guadalajara RCS Referring Phys: [phone] PHILIP J NAHSER  IMPRESSIONS   1. Left ventricular ejection fraction, by estimation, is 65 to 70%. Left ventricular ejection fraction by 3D volume is 69 %. The left ventricle has normal function. The left ventricle has no regional wall motion abnormalities. There is mild left ventricular hypertrophy. Left ventricular diastolic parameters were normal. 2. Right ventricular systolic function is normal. The right ventricular size is normal. There is normal pulmonary artery systolic pressure. The estimated right ventricular systolic pressure is 15.1 mmHg. 3. The mitral valve is normal in structure. Trivial mitral valve regurgitation. 4. The aortic valve is tricuspid. Aortic valve regurgitation is not visualized. No aortic stenosis is present. 5. The inferior vena cava is normal in size with greater than 50% respiratory variability, suggesting right atrial pressure of 3 mmHg.  Comparison(s): No significant change from prior study.  FINDINGS Left Ventricle: Left ventricular ejection fraction, by estimation, is 65 to 70%. Left ventricular ejection fraction by 3D volume is 69 %. The left ventricle has normal function. The left ventricle has no regional wall motion abnormalities. The left ventricular internal cavity size was normal in size. There is mild left ventricular hypertrophy. Left ventricular diastolic parameters were normal.  Right Ventricle: The right ventricular size is normal. No increase in right ventricular wall thickness. Right ventricular  systolic function is normal. There is normal pulmonary artery systolic pressure. The tricuspid regurgitant velocity is 1.74 m/s, and with an assumed right atrial pressure of 3 mmHg, the estimated right ventricular systolic pressure is 15.1 mmHg.  Left Atrium: Left atrial size was normal in size.  Right Atrium: Right atrial size was normal in size.  Pericardium: There is no evidence of pericardial effusion.  Mitral Valve: The mitral valve is normal in structure. Trivial mitral valve regurgitation.  Tricuspid Valve: The tricuspid valve is normal in structure. Tricuspid valve regurgitation is trivial.  Aortic Valve: The aortic valve is tricuspid. Aortic valve regurgitation is not visualized. No aortic stenosis is present.  Pulmonic Valve: The pulmonic valve was normal in structure. Pulmonic valve regurgitation is trivial.  Aorta: The aortic root is normal in size and structure.  Venous: The inferior vena cava is normal in size with greater than 50% respiratory variability, suggesting right atrial pressure of 3 mmHg.  IAS/Shunts: The atrial septum is grossly normal.   LEFT VENTRICLE PLAX 2D LVIDd:  3.60 cm         Diastology LVIDs:         2.50 cm         LV e' medial:    6.09 cm/s LV PW:         1.10 cm         LV E/e' medial:  12.1 LV IVS:        1.20 cm         LV e' lateral:   9.90 cm/s LVOT diam:     2.10 cm         LV E/e' lateral: 7.5 LV SV:         69 LV SV Index:   43 LVOT Area:     3.46 cm        3D Volume EF LV 3D EF:    Left ventricul ar ejection fraction by 3D volume is 69 %.  3D Volume EF: 3D EF:        69 % LV EDV:       69 ml LV ESV:       21 ml LV SV:        48 ml  RIGHT VENTRICLE RV Basal diam:  2.60 cm RV S prime:     11.00 cm/s TAPSE (M-mode): 1.8 cm RVSP:           15.1 mmHg  LEFT ATRIUM             Index        RIGHT ATRIUM           Index LA diam:        2.80 cm 1.75 cm/m   RA Pressure: 3.00 mmHg LA Vol (A2C):   39.3 ml 24.50 ml/m   RA Area:     10.20 cm LA Vol (A4C):   20.1 ml 12.53 ml/m  RA Volume:   19.90 ml  12.40 ml/m LA Biplane Vol: 28.2 ml 17.58 ml/m AORTIC VALVE LVOT Vmax:   83.80 cm/s LVOT Vmean:  57.600 cm/s LVOT VTI:    0.199 m  AORTA Ao Root diam: 2.80 cm Ao Asc diam:  2.90 cm  MITRAL VALVE               TRICUSPID VALVE MV Area (PHT):             TR Peak grad:   12.1 mmHg MV Decel Time:             TR Vmax:        174.00 cm/s MV E velocity: 73.90 cm/s  Estimated RAP:  3.00 mmHg MV A velocity: 72.30 cm/s  RVSP:           15.1 mmHg MV E/A ratio:  1.02 SHUNTS Systemic VTI:  0.20 m Systemic Diam: 2.10 cm  Powell Sorrow MD Electronically signed by Powell Sorrow MD Signature Date/Time: 09/25/2022/2:54:07 PM    Final    MONITORS  LONG TERM MONITOR-LIVE TELEMETRY (3-14 DAYS) 04/13/2021  Narrative  Sinus rhythm  Frequent PACs , rare PVCS  No serious arrhythmias   Patch Wear Time:  13 days and 20 hours (2023-01-26T21:37:45-0500 to 2023-02-09T17:51:12-498)  Patient had a min HR of 59 bpm, max HR of 134 bpm, and avg HR of 84 bpm. Predominant underlying rhythm was Sinus Rhythm. Isolated SVEs were frequent (24.1%, [id number]), SVE Couplets were rare (<1.0%, 399), and SVE Triplets were rare (<1.0%, 54). Isolated VEs were rare (<1.0%), VE Couplets were rare (<  1.0%), and no VE Triplets were present.       ______________________________________________________________________________________________      Results   Risk Assessment/Calculations:     CHA2DS2-VASc Score = 3  {Click here to calculate score.  REFRESH note before signing. :1} This indicates a 3.2% annual risk of stroke. The patient's score is based upon: CHF History: 0 HTN History: 1 Diabetes History: 1 Stroke History: 0 Vascular Disease History: 0 Age Score: 0 Gender Score: 1     No BP recorded.  {Refresh Note OR Click here to enter BP  :1}***        Physical Exam:    VS:  LMP 02/16/2018  ***    Wt  Readings from Last 3 Encounters:  03/05/24 150 lb (68 kg)  12/13/23 147 lb (66.7 kg)  10/18/23 147 lb (66.7 kg)     GEN: Well nourished, well developed, in no acute distress NECK: No JVD; No carotid bruits CARDIAC: ***RRR, no murmurs, rubs, gallops RESPIRATORY:  Clear to auscultation without rales, wheezing or rhonchi  ABDOMEN: Soft, non-tender, non-distended, normal bowel sounds EXTREMITIES:  Warm and well perfused, no edema; No deformity, 2+ radial pulses PSYCH: Normal mood and affect   ASSESSMENT AND PLAN: .    Assessment and Plan   #CAD s/p Multiple PCIs On indefinite DAPT  #PAF (not on OAC) - Isolated episode of PAF in the setting of her postsurgical course after renal transplant in 2022. Eliquis***  #HTN  #HLD - LDL on 02/29/2024 was 68 with triglycerides 75 Increase atorvastatin  to 80 mg daily*** Assessment & Plan        {Are you ordering a CV Procedure (e.g. stress test, cath, DCCV, TEE, etc)?   Press F2        :[id number]}    This note was written with the assistance of a dictation microphone or AI dictation software. Please excuse any typos or grammatical errors.   Signed, Georganna Archer, MD  03/11/2024 1:46 PM    [facility] "

## 2024-03-12 ENCOUNTER — Encounter: Payer: Self-pay | Admitting: Student in an Organized Health Care Education/Training Program

## 2024-03-12 ENCOUNTER — Other Ambulatory Visit (HOSPITAL_COMMUNITY): Payer: Self-pay

## 2024-03-12 ENCOUNTER — Ambulatory Visit: Attending: Cardiology | Admitting: Student in an Organized Health Care Education/Training Program

## 2024-03-12 VITALS — BP 108/72 | HR 72 | Ht 62.0 in | Wt 152.4 lb

## 2024-03-12 DIAGNOSIS — I25118 Atherosclerotic heart disease of native coronary artery with other forms of angina pectoris: Secondary | ICD-10-CM

## 2024-03-12 DIAGNOSIS — Z01812 Encounter for preprocedural laboratory examination: Secondary | ICD-10-CM | POA: Diagnosis not present

## 2024-03-12 MED ORDER — ISOSORBIDE MONONITRATE ER 30 MG PO TB24
15.0000 mg | ORAL_TABLET | Freq: Every day | ORAL | 3 refills | Status: DC
  Filled 2024-03-12: qty 45, 90d supply, fill #0

## 2024-03-12 NOTE — Patient Instructions (Addendum)
 Medication Instructions:  START Imdur  15 mg daily   *If you need a refill on your cardiac medications before your next appointment, please call your pharmacy*  Testing/Procedures: LEFT HEART CATH    Scheduled at [facility] 1/19 Arrive at 8:30 am   Follow instructions below  Your physician has requested that you have a cardiac catheterization. Cardiac catheterization is used to diagnose and/or treat various heart conditions. Doctors may recommend this procedure for a number of different reasons. The most common reason is to evaluate chest pain. Chest pain can be a symptom of coronary artery disease (CAD), and cardiac catheterization can show whether plaque is narrowing or blocking your hearts arteries. This procedure is also used to evaluate the valves, as well as measure the blood flow and oxygen levels in different parts of your heart. For further information please visit [email or web address] Please follow instruction sheet, as given.   Follow-Up: At [facility], you and your health needs are our priority.  As part of our continuing mission to provide you with exceptional heart care, our providers are all part of one team.  This team includes your primary Cardiologist (physician) and Advanced Practice Providers or APPs (Physician Assistants and Nurse Practitioners) who all work together to provide you with the care you need, when you need it.  Your next appointment:  Tuesday 2/3 at 10:55 am  2 week(s) POST CATH   Provider:   One of our Advanced Practice Providers (APPs): Morse Clause, PA-C  Lamarr Satterfield, NP Miriam Shams, NP  Olivia Pavy, PA-C Josefa Beauvais, NP  Leontine Salen, PA-C Orren Fabry, PA-C  [place], PA-C Ernest Dick, NP  Damien Braver, NP Jon Hails, PA-C  Waddell Donath, PA-C    Dayna Dunn, PA-C  Scott Weaver, PA-C Lum Louis, NP Katlyn West, NP Callie Goodrich, PA-C  Xika Zhao, NP Sheng Haley, PA-C    Kathleen Johnson, PA-C   Then, Georganna Archer, MD will plan to see you again in 3 month(s).    We recommend signing up for the patient portal called MyChart.  Sign up information is provided on this After Visit Summary.  MyChart is used to connect with patients for Virtual Visits (Telemedicine).  Patients are able to view lab/test results, encounter notes, upcoming appointments, etc.  Non-urgent messages can be sent to your provider as well.   To learn more about what you can do with MyChart, go to [email or web address].       [facility] A DEPT OF [place].  [facility] AT [address] A DEPT OF THE [place]. [facility] [address] [place] [address] Dept: [phone] Loc: [phone]  Paula Bass  03/12/2024  You are scheduled for a Cardiac Catheterization on Monday, January 19 with Dr. Lonni End.  1. Please arrive at the [facility] (Main Entrance A) at [facility]: [address] [place], [place] [address] at 8:30 AM (This time is 2 hour(s) before your procedure to ensure your preparation).   Free valet parking service is available. You will check in at ADMITTING. The support person will be asked to wait in the waiting room.  It is OK to have someone drop you off and come back when you are ready to be discharged.    Special note: Every effort is made to have your procedure done on time. Please understand that emergencies sometimes delay scheduled procedures.  2. Diet: Nothing to eat after midnight.   3. Hydration: You  need to be well hydrated before your procedure. On January 19, you may drink approved liquids (see below) until 2 hours before the procedure, with 16 oz of water as your last intake.   List of approved liquids water, clear juice, clear tea, black coffee, fruit juices, non-citric and without pulp, carbonated beverages, Gatorade, Kool -Aid, plain Jello-O and plain ice popsicles.  4. Labs: You will need to have blood drawn today 1st Floor     5. Medication instructions in preparation for your procedure:   Take 1/2 dose of Insulin  night before  Hold morning dose of insulin       On the morning of your procedure, take your Aspirin  81 mg and Plavix /Clopidogrel  and any morning medicines NOT listed above.  You may use sips of water.  6. Plan to go home the same day, you will only stay overnight if medically necessary. 7. Bring a current list of your medications and current insurance cards. 8. You MUST have a responsible person to drive you home. 9. Someone MUST be with you the first 24 hours after you arrive home or your discharge will be delayed. 10. Please wear clothes that are easy to get on and off and wear slip-on shoes.  Thank you for allowing us  to care for you!   -- [facility]

## 2024-03-13 ENCOUNTER — Ambulatory Visit: Payer: Self-pay | Admitting: Student in an Organized Health Care Education/Training Program

## 2024-03-13 ENCOUNTER — Telehealth: Payer: Self-pay | Admitting: *Deleted

## 2024-03-13 LAB — CBC WITH DIFFERENTIAL/PLATELET
Basophils Absolute: 0 x10E3/uL (ref 0.0–0.2)
Basos: 0 %
EOS (ABSOLUTE): 0.1 x10E3/uL (ref 0.0–0.4)
Eos: 1 %
Hematocrit: 48 % — ABNORMAL HIGH (ref 34.0–46.6)
Hemoglobin: 15.5 g/dL (ref 11.1–15.9)
Immature Grans (Abs): 0 x10E3/uL (ref 0.0–0.1)
Immature Granulocytes: 0 %
Lymphocytes Absolute: 1.9 x10E3/uL (ref 0.7–3.1)
Lymphs: 18 %
MCH: 29 pg (ref 26.6–33.0)
MCHC: 32.3 g/dL (ref 31.5–35.7)
MCV: 90 fL (ref 79–97)
Monocytes Absolute: 0.6 x10E3/uL (ref 0.1–0.9)
Monocytes: 5 %
Neutrophils Absolute: 8 x10E3/uL — ABNORMAL HIGH (ref 1.4–7.0)
Neutrophils: 76 %
Platelets: 242 x10E3/uL (ref 150–450)
RBC: 5.35 x10E6/uL — ABNORMAL HIGH (ref 3.77–5.28)
RDW: 14.1 % (ref 11.7–15.4)
WBC: 10.6 x10E3/uL (ref 3.4–10.8)

## 2024-03-13 LAB — BASIC METABOLIC PANEL WITH GFR
BUN/Creatinine Ratio: 17 (ref 9–23)
BUN: 15 mg/dL (ref 6–24)
CO2: 18 mmol/L — ABNORMAL LOW (ref 20–29)
Calcium: 9.8 mg/dL (ref 8.7–10.2)
Chloride: 104 mmol/L (ref 96–106)
Creatinine, Ser: 0.87 mg/dL (ref 0.57–1.00)
Glucose: 117 mg/dL — ABNORMAL HIGH (ref 70–99)
Potassium: 4.4 mmol/L (ref 3.5–5.2)
Sodium: 139 mmol/L (ref 134–144)
eGFR: 80 mL/min/1.73

## 2024-03-13 NOTE — Telephone Encounter (Signed)
 Cardiac Catheterization scheduled at [facility] for: Monday March 17, 2024 10:30 AM Arrival time [facility] Main Entrance A at: 8:30 AM  Diet: -Nothing to eat after midnight.  Hydration: -May drink clear liquids until 2 hours before the procedure.  Approved liquids: Water, clear tea, black coffee, fruit juices-non-citric and without pulp,Gatorade, plain Jello/popsicles.   -Please drink 16 oz of water 2 hours before procedure.  Medication instructions: -Hold:  Insulin -AM of procedure/1/2 usual dose HS prior to procedure -Other usual morning medications can be taken including aspirin  81 mg and Plavix  75 mg.  Plan to go home the same day, you will only stay overnight if medically necessary.  You must have responsible adult to drive you home.  Someone must be with you the first 24 hours after you arrive home.  Reviewed procedure instructions with patient.

## 2024-03-17 ENCOUNTER — Ambulatory Visit: Payer: Self-pay | Admitting: Student in an Organized Health Care Education/Training Program

## 2024-03-17 ENCOUNTER — Ambulatory Visit (HOSPITAL_COMMUNITY)
Admission: RE | Admit: 2024-03-17 | Discharge: 2024-03-17 | Disposition: A | Discharge status: Home / Self Care | Attending: Internal Medicine | Admitting: Internal Medicine

## 2024-03-17 ENCOUNTER — Other Ambulatory Visit: Payer: Self-pay

## 2024-03-17 ENCOUNTER — Encounter (HOSPITAL_COMMUNITY)
Admission: RE | Disposition: A | Discharge status: Home / Self Care | Payer: Self-pay | Source: Home / Self Care | Attending: Internal Medicine

## 2024-03-17 DIAGNOSIS — E1122 Type 2 diabetes mellitus with diabetic chronic kidney disease: Secondary | ICD-10-CM | POA: Insufficient documentation

## 2024-03-17 DIAGNOSIS — E785 Hyperlipidemia, unspecified: Secondary | ICD-10-CM | POA: Diagnosis not present

## 2024-03-17 DIAGNOSIS — Z79899 Other long term (current) drug therapy: Secondary | ICD-10-CM | POA: Insufficient documentation

## 2024-03-17 DIAGNOSIS — I25110 Atherosclerotic heart disease of native coronary artery with unstable angina pectoris: Secondary | ICD-10-CM | POA: Insufficient documentation

## 2024-03-17 DIAGNOSIS — Z7902 Long term (current) use of antithrombotics/antiplatelets: Secondary | ICD-10-CM | POA: Insufficient documentation

## 2024-03-17 DIAGNOSIS — Z7982 Long term (current) use of aspirin: Secondary | ICD-10-CM | POA: Diagnosis not present

## 2024-03-17 DIAGNOSIS — Z955 Presence of coronary angioplasty implant and graft: Secondary | ICD-10-CM | POA: Insufficient documentation

## 2024-03-17 DIAGNOSIS — I2584 Coronary atherosclerosis due to calcified coronary lesion: Secondary | ICD-10-CM | POA: Diagnosis not present

## 2024-03-17 DIAGNOSIS — N186 End stage renal disease: Secondary | ICD-10-CM | POA: Insufficient documentation

## 2024-03-17 DIAGNOSIS — I480 Paroxysmal atrial fibrillation: Secondary | ICD-10-CM | POA: Insufficient documentation

## 2024-03-17 DIAGNOSIS — I1311 Hypertensive heart and chronic kidney disease without heart failure, with stage 5 chronic kidney disease, or end stage renal disease: Secondary | ICD-10-CM | POA: Insufficient documentation

## 2024-03-17 DIAGNOSIS — I25118 Atherosclerotic heart disease of native coronary artery with other forms of angina pectoris: Secondary | ICD-10-CM | POA: Diagnosis not present

## 2024-03-17 HISTORY — PX: LEFT HEART CATH AND CORONARY ANGIOGRAPHY: CATH118249

## 2024-03-17 LAB — GLUCOSE, CAPILLARY: Glucose-Capillary: 117 mg/dL — ABNORMAL HIGH (ref 70–99)

## 2024-03-17 MED ORDER — FREE WATER: 500.0000 mL | Freq: Once | Status: DC

## 2024-03-17 MED ORDER — IOHEXOL 350 MG/ML SOLN
INTRAVENOUS | Status: DC | PRN
  Administered 2024-03-17: 28 mL via INTRA_ARTERIAL

## 2024-03-17 MED ORDER — SODIUM CHLORIDE 0.9 % IV SOLN: 250.0000 mL | INTRAVENOUS | Status: DC | PRN

## 2024-03-17 MED ORDER — VERAPAMIL HCL 2.5 MG/ML IV SOLN
INTRAVENOUS | Status: DC | PRN
  Administered 2024-03-17: 10 mL via INTRA_ARTERIAL

## 2024-03-17 MED ORDER — FREE WATER
500.0000 mL | Freq: Once | Status: AC
  Administered 2024-03-17: 500 mL via ORAL

## 2024-03-17 MED ORDER — ONDANSETRON HCL 4 MG/2ML IJ SOLN: 4.0000 mg | Freq: Four times a day (QID) | INTRAMUSCULAR | Status: DC | PRN

## 2024-03-17 MED ORDER — SODIUM CHLORIDE 0.9% FLUSH: 3.0000 mL | Freq: Two times a day (BID) | INTRAVENOUS | Status: DC

## 2024-03-17 MED ORDER — SODIUM CHLORIDE 0.9% FLUSH: 3.0000 mL | INTRAVENOUS | Status: DC | PRN

## 2024-03-17 MED ORDER — HEPARIN SODIUM (PORCINE) 1000 UNIT/ML IJ SOLN
INTRAMUSCULAR | Status: DC | PRN
  Administered 2024-03-17: 3500 [IU] via INTRAVENOUS

## 2024-03-17 MED ORDER — CLOPIDOGREL BISULFATE 75 MG PO TABS: 75.0000 mg | ORAL_TABLET | ORAL | Status: DC

## 2024-03-17 MED ORDER — FENTANYL CITRATE (PF) 100 MCG/2ML IJ SOLN
INTRAMUSCULAR | Status: DC | PRN
  Administered 2024-03-17 (×2): 12.5 ug via INTRAVENOUS

## 2024-03-17 MED ORDER — LABETALOL HCL 5 MG/ML IV SOLN: 10.0000 mg | INTRAVENOUS | Status: DC | PRN

## 2024-03-17 MED ORDER — HEPARIN (PORCINE) IN NACL 1000-0.9 UT/500ML-% IV SOLN
INTRAVENOUS | Status: DC | PRN
  Administered 2024-03-17: 500 mL via SURGICAL_CAVITY

## 2024-03-17 MED ORDER — ACETAMINOPHEN 325 MG PO TABS: 650.0000 mg | ORAL_TABLET | ORAL | Status: DC | PRN

## 2024-03-17 MED ORDER — HYDRALAZINE HCL 20 MG/ML IJ SOLN: 10.0000 mg | INTRAMUSCULAR | Status: DC | PRN

## 2024-03-17 MED ORDER — LIDOCAINE HCL (PF) 1 % IJ SOLN
INTRAMUSCULAR | Status: DC | PRN
  Administered 2024-03-17: 2 mL

## 2024-03-17 MED ORDER — HEPARIN SODIUM (PORCINE) 1000 UNIT/ML IJ SOLN
INTRAMUSCULAR | Status: AC
  Filled 2024-03-17: qty 10

## 2024-03-17 MED ORDER — ASPIRIN 81 MG PO CHEW
81.0000 mg | CHEWABLE_TABLET | ORAL | Status: AC
  Administered 2024-03-17: 81 mg via ORAL
  Filled 2024-03-17: qty 1

## 2024-03-17 MED ORDER — MIDAZOLAM HCL 2 MG/2ML IJ SOLN
INTRAMUSCULAR | Status: AC
  Filled 2024-03-17: qty 2

## 2024-03-17 MED ORDER — MIDAZOLAM HCL (PF) 2 MG/2ML IJ SOLN
INTRAMUSCULAR | Status: DC | PRN
  Administered 2024-03-17 (×2): .5 mg via INTRAVENOUS

## 2024-03-17 MED ORDER — LIDOCAINE HCL (PF) 1 % IJ SOLN
INTRAMUSCULAR | Status: AC
  Filled 2024-03-17: qty 30

## 2024-03-17 MED ORDER — FENTANYL CITRATE (PF) 100 MCG/2ML IJ SOLN
INTRAMUSCULAR | Status: AC
  Filled 2024-03-17: qty 2

## 2024-03-17 MED ORDER — VERAPAMIL HCL 2.5 MG/ML IV SOLN
INTRAVENOUS | Status: AC
  Filled 2024-03-17: qty 2

## 2024-03-17 NOTE — Interval H&P Note (Signed)
 History and Physical Interval Note:  03/17/2024 10:09 AM  Paula Bass  has presented today for surgery, with the diagnosis of coronary artery disease with unstable angina.  The various methods of treatment have been discussed with the patient and family. After consideration of risks, benefits and other options for treatment, the patient has consented to  Procedures: LEFT HEART CATH AND CORONARY ANGIOGRAPHY (N/A) as a surgical intervention.  The patient's history has been reviewed, patient examined, no change in status, stable for surgery.  I have reviewed the patient's chart and labs.  Questions were answered to the patient's satisfaction.    Cath Lab Visit (complete for each Cath Lab visit)  Clinical Evaluation Leading to the Procedure:   ACS: No.  Non-ACS:    Anginal Classification: CCS IV  Anti-ischemic medical therapy: Maximal Therapy (2 or more classes of medications)  Non-Invasive Test Results: No non-invasive testing performed  Prior CABG: No previous CABG  Paula Bass

## 2024-03-18 ENCOUNTER — Encounter (HOSPITAL_COMMUNITY): Payer: Self-pay | Admitting: Internal Medicine

## 2024-03-19 ENCOUNTER — Encounter: Payer: Self-pay | Admitting: Student in an Organized Health Care Education/Training Program

## 2024-03-19 ENCOUNTER — Other Ambulatory Visit: Payer: Self-pay | Admitting: Student in an Organized Health Care Education/Training Program

## 2024-03-19 MED ORDER — ISOSORBIDE MONONITRATE ER 30 MG PO TB24: 15.0000 mg | ORAL_TABLET | Freq: Every day | ORAL | 3 refills | Status: DC

## 2024-03-19 NOTE — Telephone Encounter (Signed)
 Error

## 2024-03-31 ENCOUNTER — Encounter: Payer: Self-pay | Admitting: *Deleted

## 2024-04-01 ENCOUNTER — Encounter: Payer: Self-pay | Admitting: Emergency Medicine

## 2024-04-01 ENCOUNTER — Ambulatory Visit: Admitting: Emergency Medicine

## 2024-04-01 VITALS — BP 120/82 | HR 72 | Ht 62.0 in | Wt 153.2 lb

## 2024-04-01 DIAGNOSIS — I10 Essential (primary) hypertension: Secondary | ICD-10-CM

## 2024-04-01 DIAGNOSIS — I480 Paroxysmal atrial fibrillation: Secondary | ICD-10-CM | POA: Diagnosis not present

## 2024-04-01 DIAGNOSIS — E782 Mixed hyperlipidemia: Secondary | ICD-10-CM

## 2024-04-01 DIAGNOSIS — I25119 Atherosclerotic heart disease of native coronary artery with unspecified angina pectoris: Secondary | ICD-10-CM

## 2024-04-01 LAB — BASIC METABOLIC PANEL WITH GFR
BUN/Creatinine Ratio: 21 (ref 9–23)
BUN: 18 mg/dL (ref 6–24)
CO2: 19 mmol/L — ABNORMAL LOW (ref 20–29)
Calcium: 9.8 mg/dL (ref 8.7–10.2)
Chloride: 105 mmol/L (ref 96–106)
Creatinine, Ser: 0.84 mg/dL (ref 0.57–1.00)
Glucose: 74 mg/dL (ref 70–99)
Potassium: 3.9 mmol/L (ref 3.5–5.2)
Sodium: 138 mmol/L (ref 134–144)
eGFR: 84 mL/min/{1.73_m2}

## 2024-04-01 MED ORDER — ISOSORBIDE MONONITRATE ER 30 MG PO TB24: 30.0000 mg | ORAL_TABLET | Freq: Every day | ORAL | 3 refills | Status: AC

## 2024-04-01 NOTE — Progress Notes (Signed)
 " Cardiology Office Note:    Date:  04/01/2024  ID:  Paula Bass, DOB 12-31-1970, MRN [id number] PCP: Catalina Bare, MD  [facility] Providers Cardiologist:  Georganna Archer, MD       Patient Profile:       Chief Complaint: Follow-up postcardiac catheterization History of Present Illness:  Paula Bass is a 54 y.o. female with visit-pertinent history of coronary artery disease s/p multiple PCI's (2017, 2020, 2021), ESRD s/p KT in 2022, hypertension, hyperlipidemia, T2DM, paroxysmal atrial fibrillation, and SLE  She has history of CAD s/p prior PCI in [place]  2017 and repeat LHC 03/2018 with distal left circumflex 85%, OM1 40%, OM2 50%, P RCA stent patent with mid RCA 90% treated medically and PCI/DES to mid/distal LCx, and LHC 02/13/2020 with orbital arthrectomy/DES to OM1 and OM 2   Last seen in clinic on 03/12/2024 by Dr. Archer.  Patient was experiencing intermittent chest pains with an increase in frequency over the past week.  The chest pain began after returning from a trip.  Symptoms are reminiscent of her prior cardiac chest pain.  The patient was started on low-dose Imdur  to help with her anginal symptoms.  She underwent cardiac catheterization on 03/17/2024 showing significant two-vessel coronary artery disease with chronic total occlusion of LCx following takeoff of OM 2 and sequential 99% mid RCA stenosis with heavy calcification and occlusion of ostial rPDA.  Distal LCx and rPDA are supplied by collaterals.  Widely patent OM1 and OM 2 stents.  Patent proximal RCA stent with mild in-stent restenosis.  Unable to assess patency of distal LCx stent as the vessel is occluded proximally with a stent and collaterals only fill the distal portion of the stent.  Plan was to proceed with medical management.   Discussed the use of AI scribe software for clinical note transcription with the patient, who gave verbal consent to proceed.  History of Present  Illness Today patient is doing well.  Reports improvement in her chest pains.  Her chest pains are predictable and often precipitated by burping which causes some left-sided chest tightness with radiation down the left arm.  The symptoms have gotten better since starting on isosorbide .  However patient reports she has been taking her isosorbide  at 30 mg daily instead of the prescribed 15 mg daily due to some medication confusion.  She is also only been taking the isosorbide  as needed instead of daily.  Other medications listed she has been adherent to.  She reports being active without chest discomfort or dyspnea.  No orthopnea, PND, syncope, presyncope.  No hematochezia or melena.    Review of systems:  Please see the history of present illness. All other systems are reviewed and otherwise negative.      Studies Reviewed:    EKG Interpretation Date/Time:  Tuesday April 01 2024 11:16:12 EST Ventricular Rate:  72 PR Interval:  200 QRS Duration:  84 QT Interval:  388 QTC Calculation: 424 R Axis:   17  Text Interpretation: Normal sinus rhythm Inferior infarct , age undetermined Cannot rule out Anterior infarct (cited on or before 25-May-2023) When compared with ECG of 05-Mar-2024 13:34, Fusion complexes are no longer Present Questionable change in initial forces of Lateral leads Confirmed by Rana Dixon [phone]) on 04/01/2024 12:09:53 PM    Cardiac catheterization 03/17/2024   Prox LAD to Mid LAD lesion is 50% stenosed.   Mid RCA-1 lesion is 99% stenosed.   Mid RCA-2 lesion is 99% stenosed.  Dist Cx-1 lesion is 100% stenosed.   Ost RPDA to RPDA lesion is 100% stenosed.   Prox RCA lesion is 15% stenosed.   Prox RCA to Mid RCA lesion is 50% stenosed.   Non-stenotic Dist Cx-2 lesion was previously treated.   Non-stenotic Ost 1st Mrg to 1st Mrg lesion was previously treated.   Non-stenotic Ost 2nd Mrg to 2nd Mrg lesion was previously treated.   The left ventricular systolic function  is normal.   LV end diastolic pressure is normal.   The left ventricular ejection fraction is 55-65% by visual estimate.   There is no aortic valve stenosis.   In the absence of any other complications or medical issues, we expect the patient to be ready for discharge from a cath perspective on 03/17/2024.   Recommend dual antiplatelet therapy with Aspirin  81mg  daily and Clopidogrel  75mg  daily.   Conclusions: Significant two-vessel coronary artery disease, as detailed below, with chronic total occlusion of mid-LCx following takeoff of OM2 and sequential 99% mid RCA stenoses with heavy calcification and occlusion of ostial rPDA.  Distal LCx and rPDA are supplied by collaterals. Widely patent OM1 and OM2 stents. Patent proximal RCA stent with mild in-stent restenosis. Unable to assess patency of distal LCx stent, as the vessel is occluded proximal the stent and collaterals only fill the distal portion of the stent. Normal left ventricular systolic function (LVEF 55-65%) and filling pressure (LVEDP 13 mmHg).   Recommendations: Continue aggressive secondary prevention of coronary artery disease. Proceed with addition of isosorbide  mononitrate as recommended by Dr. Floretta at recent office visit.  Continue current dose of metoprolol . Diagnostic Dominance: Right  Echocardiogram 09/25/2022 1. Left ventricular ejection fraction, by estimation, is 65 to 70%. Left  ventricular ejection fraction by 3D volume is 69 %. The left ventricle has  normal function. The left ventricle has no regional wall motion  abnormalities. There is mild left  ventricular hypertrophy. Left ventricular diastolic parameters were  normal.   2. Right ventricular systolic function is normal. The right ventricular  size is normal. There is normal pulmonary artery systolic pressure. The  estimated right ventricular systolic pressure is 15.1 mmHg.   3. The mitral valve is normal in structure. Trivial mitral valve   regurgitation.   4. The aortic valve is tricuspid. Aortic valve regurgitation is not  visualized. No aortic stenosis is present.   5. The inferior vena cava is normal in size with greater than 50%  respiratory variability, suggesting right atrial pressure of 3 mmHg.   Zio 03/22/2021 Sinus rhythm Frequent PACs , rare PVCS No serious arrhythmias     Patch Wear Time:  13 days and 20 hours (2023-01-26T21:37:45-0500 to 2023-02-09T17:51:12-498)   Patient had a min HR of 59 bpm, max HR of 134 bpm, and avg HR of 84 bpm. Predominant underlying rhythm was Sinus Rhythm. Isolated SVEs were frequent (24.1%, [id number]), SVE Couplets were rare (<1.0%, 399), and SVE Triplets were rare (<1.0%, 54). Isolated  VEs were rare (<1.0%), VE Couplets were rare (<1.0%), and no VE Triplets were present.     Risk Assessment/Calculations:    CHA2DS2-VASc Score = 3   This indicates a 3.2% annual risk of stroke. The patient's score is based upon: CHF History: 0 HTN History: 1 Diabetes History: 1 Stroke History: 0 Vascular Disease History: 0 Age Score: 0 Gender Score: 1             Physical Exam:   VS:  BP 120/82 (BP Location: Right Arm, Patient  Position: Sitting, Cuff Size: Large)   Pulse 72   Ht 5' 2 (1.575 m)   Wt 153 lb 3.2 oz (69.5 kg)   LMP 02/16/2018   SpO2 95%   BMI 28.02 kg/m    Wt Readings from Last 3 Encounters:  04/01/24 153 lb 3.2 oz (69.5 kg)  03/17/24 150 lb (68 kg)  03/12/24 152 lb 6.4 oz (69.1 kg)    GEN: Well nourished, well developed in no acute distress NECK: No JVD; No carotid bruits CARDIAC: RRR, no murmurs, rubs, gallops RESPIRATORY:  Clear to auscultation without rales, wheezing or rhonchi  ABDOMEN: Soft, non-tender, non-distended EXTREMITIES:  No edema; No acute deformity      Assessment and Plan:  Coronary artery disease S/p multiple PCI's in 2017, 2020, and 2021 Most recent cath 02/2024 showed patent OM1, OM 2, and RCA stents.  Unable to assess patency of distal  LCx stent due to CTO of mid LCx.  Distal LCx and rPDA supplied by collaterals.  Medical management recommended. - Today patient reports improvement in her chest pains.  Symptoms consistent with stable angina.  No further ischemic evaluation warranted at this time. - Plan to increase her isosorbide  to 30 mg daily - Continue metoprolol  tartrate 25 mg twice daily - Continue aspirin  81 mg daily and clopidogrel  75 mg daily indefinitely - Continue atorvastatin  40 mg - BMET today  Paroxysmal atrial fibrillation Isolated episode of PAF in the setting of her postsurgical course after renal transplant in 2022 Cardiac monitor 03/2021 revealed sinus rhythm - She has had no recurrence since initial episode.  Not currently on DOAC - Today she is without symptoms concerning for recurrent atrial fibrillation and denies any palpitations - Continue metoprolol  tartrate 25 mg twice daily  Hypertension Blood pressure today is well-controlled at 120/82 - No changes.  Continue current medication regimen  Hyperlipidemia, LDL goal <55 LDL 68 in 02/2024 - We discussed uptitrating statin therapy and patient prefers to focus on diet and exercise which is reasonable - Continue atorvastatin  40 mg daily - Repeat fasting lipid panel at follow-up visit and consider increasing statin therapy at that time      Dispo:  Return in about 3 months (around 06/29/2024).  Signed, Lum LITTIE Louis, NP  "

## 2024-04-03 ENCOUNTER — Ambulatory Visit: Payer: Self-pay | Admitting: Emergency Medicine

## 2024-06-10 ENCOUNTER — Ambulatory Visit: Admitting: Student in an Organized Health Care Education/Training Program

## 2024-06-30 ENCOUNTER — Ambulatory Visit: Admitting: Student in an Organized Health Care Education/Training Program
